# Patient Record
Sex: Female | Born: 1980 | Race: Black or African American | Hispanic: No | Marital: Single | Smoking: Never smoker
Health system: Southern US, Community
[De-identification: ages and names within clinical notes are randomized; demographics above are authoritative.]

## PROBLEM LIST (undated history)

## (undated) DIAGNOSIS — D649 Anemia, unspecified: Secondary | ICD-10-CM

## (undated) DIAGNOSIS — A4902 Methicillin resistant Staphylococcus aureus infection, unspecified site: Secondary | ICD-10-CM

## (undated) DIAGNOSIS — C73 Malignant neoplasm of thyroid gland: Secondary | ICD-10-CM

## (undated) DIAGNOSIS — R51 Headache: Secondary | ICD-10-CM

## (undated) DIAGNOSIS — Z8614 Personal history of Methicillin resistant Staphylococcus aureus infection: Secondary | ICD-10-CM

## (undated) DIAGNOSIS — R002 Palpitations: Secondary | ICD-10-CM

## (undated) DIAGNOSIS — M797 Fibromyalgia: Secondary | ICD-10-CM

## (undated) DIAGNOSIS — I349 Nonrheumatic mitral valve disorder, unspecified: Secondary | ICD-10-CM

## (undated) DIAGNOSIS — E039 Hypothyroidism, unspecified: Secondary | ICD-10-CM

## (undated) DIAGNOSIS — M1990 Unspecified osteoarthritis, unspecified site: Secondary | ICD-10-CM

## (undated) DIAGNOSIS — G894 Chronic pain syndrome: Secondary | ICD-10-CM

## (undated) DIAGNOSIS — F4310 Post-traumatic stress disorder, unspecified: Secondary | ICD-10-CM

## (undated) DIAGNOSIS — J45909 Unspecified asthma, uncomplicated: Secondary | ICD-10-CM

## (undated) DIAGNOSIS — N809 Endometriosis, unspecified: Secondary | ICD-10-CM

## (undated) DIAGNOSIS — M5481 Occipital neuralgia: Secondary | ICD-10-CM

## (undated) DIAGNOSIS — M419 Scoliosis, unspecified: Secondary | ICD-10-CM

## (undated) DIAGNOSIS — F329 Major depressive disorder, single episode, unspecified: Secondary | ICD-10-CM

## (undated) DIAGNOSIS — I499 Cardiac arrhythmia, unspecified: Secondary | ICD-10-CM

## (undated) DIAGNOSIS — M5030 Other cervical disc degeneration, unspecified cervical region: Secondary | ICD-10-CM

## (undated) DIAGNOSIS — R011 Cardiac murmur, unspecified: Secondary | ICD-10-CM

## (undated) DIAGNOSIS — R0600 Dyspnea, unspecified: Secondary | ICD-10-CM

## (undated) DIAGNOSIS — E119 Type 2 diabetes mellitus without complications: Secondary | ICD-10-CM

## (undated) DIAGNOSIS — R079 Chest pain, unspecified: Secondary | ICD-10-CM

## (undated) DIAGNOSIS — I340 Nonrheumatic mitral (valve) insufficiency: Secondary | ICD-10-CM

## (undated) DIAGNOSIS — I10 Essential (primary) hypertension: Secondary | ICD-10-CM

## (undated) DIAGNOSIS — K219 Gastro-esophageal reflux disease without esophagitis: Secondary | ICD-10-CM

## (undated) DIAGNOSIS — F32A Depression, unspecified: Secondary | ICD-10-CM

## (undated) DIAGNOSIS — I341 Nonrheumatic mitral (valve) prolapse: Secondary | ICD-10-CM

## (undated) DIAGNOSIS — J449 Chronic obstructive pulmonary disease, unspecified: Secondary | ICD-10-CM

## (undated) DIAGNOSIS — R0609 Other forms of dyspnea: Secondary | ICD-10-CM

## (undated) DIAGNOSIS — R519 Headache, unspecified: Secondary | ICD-10-CM

## (undated) DIAGNOSIS — F419 Anxiety disorder, unspecified: Secondary | ICD-10-CM

## (undated) HISTORY — PX: INCISION AND DRAINAGE OF WOUND: SHX1803

## (undated) HISTORY — DX: Gastro-esophageal reflux disease without esophagitis: K21.9

## (undated) HISTORY — PX: ECTOPIC PREGNANCY SURGERY: SHX613

## (undated) HISTORY — DX: Endometriosis, unspecified: N80.9

## (undated) HISTORY — DX: Dyspnea, unspecified: R06.00

## (undated) HISTORY — DX: Type 2 diabetes mellitus without complications: E11.9

## (undated) HISTORY — DX: Other forms of dyspnea: R06.09

## (undated) HISTORY — PX: CYSTECTOMY: SUR359

## (undated) HISTORY — PX: CHOLECYSTECTOMY: SHX55

## (undated) HISTORY — PX: THYROIDECTOMY: SHX17

## (undated) HISTORY — DX: Chest pain, unspecified: R07.9

## (undated) HISTORY — DX: Scoliosis, unspecified: M41.9

---

## 2001-10-23 ENCOUNTER — Emergency Department (HOSPITAL_COMMUNITY): Admission: EM | Admit: 2001-10-23 | Discharge: 2001-10-23 | Payer: Self-pay | Admitting: *Deleted

## 2003-03-06 ENCOUNTER — Inpatient Hospital Stay (HOSPITAL_COMMUNITY): Admission: EM | Admit: 2003-03-06 | Discharge: 2003-03-13 | Payer: Self-pay | Admitting: Psychiatry

## 2003-03-10 ENCOUNTER — Emergency Department (HOSPITAL_COMMUNITY): Admission: EM | Admit: 2003-03-10 | Discharge: 2003-03-10 | Payer: Self-pay | Admitting: Emergency Medicine

## 2003-12-05 ENCOUNTER — Inpatient Hospital Stay (HOSPITAL_COMMUNITY): Admission: EM | Admit: 2003-12-05 | Discharge: 2003-12-09 | Payer: Self-pay | Admitting: Psychiatry

## 2003-12-09 ENCOUNTER — Encounter: Payer: Self-pay | Admitting: Psychiatry

## 2004-11-27 ENCOUNTER — Emergency Department: Payer: Self-pay | Admitting: Emergency Medicine

## 2005-01-19 ENCOUNTER — Emergency Department: Payer: Self-pay | Admitting: Emergency Medicine

## 2005-02-08 ENCOUNTER — Emergency Department: Payer: Self-pay | Admitting: General Practice

## 2005-02-08 ENCOUNTER — Other Ambulatory Visit: Payer: Self-pay

## 2005-04-01 ENCOUNTER — Emergency Department: Payer: Self-pay | Admitting: General Practice

## 2005-05-29 ENCOUNTER — Emergency Department: Payer: Self-pay | Admitting: Emergency Medicine

## 2005-09-06 ENCOUNTER — Emergency Department: Payer: Self-pay | Admitting: Emergency Medicine

## 2005-09-29 ENCOUNTER — Emergency Department: Payer: Self-pay | Admitting: Emergency Medicine

## 2005-11-13 ENCOUNTER — Observation Stay: Payer: Self-pay | Admitting: Unknown Physician Specialty

## 2005-12-13 ENCOUNTER — Emergency Department: Payer: Self-pay | Admitting: Unknown Physician Specialty

## 2006-01-14 ENCOUNTER — Inpatient Hospital Stay (HOSPITAL_COMMUNITY): Admission: RE | Admit: 2006-01-14 | Discharge: 2006-01-17 | Payer: Self-pay | Admitting: Psychiatry

## 2006-01-15 ENCOUNTER — Ambulatory Visit: Payer: Self-pay | Admitting: Psychiatry

## 2006-03-20 ENCOUNTER — Emergency Department: Payer: Self-pay | Admitting: Emergency Medicine

## 2006-05-20 ENCOUNTER — Emergency Department: Payer: Self-pay | Admitting: Emergency Medicine

## 2006-08-08 ENCOUNTER — Emergency Department: Payer: Self-pay | Admitting: Emergency Medicine

## 2006-08-23 ENCOUNTER — Emergency Department: Payer: Self-pay | Admitting: Unknown Physician Specialty

## 2006-09-06 ENCOUNTER — Emergency Department: Payer: Self-pay | Admitting: Emergency Medicine

## 2006-10-04 ENCOUNTER — Emergency Department: Payer: Self-pay | Admitting: Emergency Medicine

## 2006-11-16 ENCOUNTER — Ambulatory Visit: Payer: Self-pay | Admitting: Internal Medicine

## 2006-11-27 ENCOUNTER — Ambulatory Visit: Payer: Self-pay

## 2006-12-08 ENCOUNTER — Emergency Department: Payer: Self-pay | Admitting: Emergency Medicine

## 2006-12-08 DIAGNOSIS — A4902 Methicillin resistant Staphylococcus aureus infection, unspecified site: Secondary | ICD-10-CM

## 2006-12-08 HISTORY — DX: Methicillin resistant Staphylococcus aureus infection, unspecified site: A49.02

## 2006-12-12 ENCOUNTER — Emergency Department: Payer: Self-pay | Admitting: General Practice

## 2006-12-28 ENCOUNTER — Ambulatory Visit: Payer: Self-pay | Admitting: Unknown Physician Specialty

## 2007-01-01 ENCOUNTER — Emergency Department: Payer: Self-pay | Admitting: Internal Medicine

## 2007-01-12 ENCOUNTER — Ambulatory Visit: Payer: Self-pay | Admitting: Internal Medicine

## 2007-02-06 ENCOUNTER — Ambulatory Visit: Payer: Self-pay | Admitting: Internal Medicine

## 2007-02-22 ENCOUNTER — Emergency Department (HOSPITAL_COMMUNITY): Admission: EM | Admit: 2007-02-22 | Discharge: 2007-02-22 | Payer: Self-pay | Admitting: Emergency Medicine

## 2007-03-09 ENCOUNTER — Ambulatory Visit: Payer: Self-pay | Admitting: Internal Medicine

## 2007-04-08 ENCOUNTER — Ambulatory Visit: Payer: Self-pay | Admitting: Internal Medicine

## 2007-05-03 ENCOUNTER — Emergency Department (HOSPITAL_COMMUNITY): Admission: EM | Admit: 2007-05-03 | Discharge: 2007-05-03 | Payer: Self-pay | Admitting: Emergency Medicine

## 2007-06-08 ENCOUNTER — Ambulatory Visit: Payer: Self-pay | Admitting: Internal Medicine

## 2007-06-18 ENCOUNTER — Ambulatory Visit: Payer: Self-pay | Admitting: Internal Medicine

## 2007-07-09 ENCOUNTER — Ambulatory Visit: Payer: Self-pay | Admitting: Internal Medicine

## 2007-07-18 ENCOUNTER — Emergency Department (HOSPITAL_COMMUNITY): Admission: EM | Admit: 2007-07-18 | Discharge: 2007-07-19 | Payer: Self-pay | Admitting: Emergency Medicine

## 2007-07-26 ENCOUNTER — Emergency Department (HOSPITAL_COMMUNITY): Admission: EM | Admit: 2007-07-26 | Discharge: 2007-07-27 | Payer: Self-pay | Admitting: Emergency Medicine

## 2007-08-02 ENCOUNTER — Emergency Department (HOSPITAL_COMMUNITY): Admission: EM | Admit: 2007-08-02 | Discharge: 2007-08-02 | Payer: Self-pay | Admitting: Emergency Medicine

## 2007-08-10 ENCOUNTER — Emergency Department (HOSPITAL_COMMUNITY): Admission: EM | Admit: 2007-08-10 | Discharge: 2007-08-10 | Payer: Self-pay | Admitting: Emergency Medicine

## 2007-08-11 ENCOUNTER — Inpatient Hospital Stay (HOSPITAL_COMMUNITY): Admission: EM | Admit: 2007-08-11 | Discharge: 2007-08-14 | Payer: Self-pay | Admitting: Emergency Medicine

## 2007-08-12 ENCOUNTER — Encounter (INDEPENDENT_AMBULATORY_CARE_PROVIDER_SITE_OTHER): Payer: Self-pay | Admitting: General Surgery

## 2007-08-15 ENCOUNTER — Encounter (HOSPITAL_COMMUNITY): Admission: RE | Admit: 2007-08-15 | Discharge: 2007-09-07 | Payer: Self-pay | Admitting: General Surgery

## 2007-08-18 ENCOUNTER — Emergency Department (HOSPITAL_COMMUNITY): Admission: EM | Admit: 2007-08-18 | Discharge: 2007-08-18 | Payer: Self-pay | Admitting: Emergency Medicine

## 2007-09-15 ENCOUNTER — Emergency Department (HOSPITAL_COMMUNITY): Admission: EM | Admit: 2007-09-15 | Discharge: 2007-09-15 | Payer: Self-pay | Admitting: Emergency Medicine

## 2007-10-09 ENCOUNTER — Ambulatory Visit: Payer: Self-pay | Admitting: Internal Medicine

## 2007-10-15 ENCOUNTER — Ambulatory Visit: Payer: Self-pay | Admitting: Internal Medicine

## 2007-10-27 ENCOUNTER — Emergency Department (HOSPITAL_COMMUNITY): Admission: EM | Admit: 2007-10-27 | Discharge: 2007-10-27 | Payer: Self-pay | Admitting: Emergency Medicine

## 2007-11-08 ENCOUNTER — Ambulatory Visit: Payer: Self-pay | Admitting: Internal Medicine

## 2007-12-03 ENCOUNTER — Emergency Department (HOSPITAL_COMMUNITY): Admission: EM | Admit: 2007-12-03 | Discharge: 2007-12-03 | Payer: Self-pay | Admitting: Emergency Medicine

## 2007-12-08 ENCOUNTER — Emergency Department: Payer: Self-pay | Admitting: Emergency Medicine

## 2007-12-10 ENCOUNTER — Emergency Department: Payer: Self-pay | Admitting: Emergency Medicine

## 2007-12-12 ENCOUNTER — Emergency Department: Payer: Self-pay | Admitting: Emergency Medicine

## 2008-02-06 ENCOUNTER — Ambulatory Visit: Payer: Self-pay | Admitting: Internal Medicine

## 2008-02-10 ENCOUNTER — Ambulatory Visit: Payer: Self-pay | Admitting: Internal Medicine

## 2008-02-11 ENCOUNTER — Emergency Department: Payer: Self-pay | Admitting: Emergency Medicine

## 2008-03-08 ENCOUNTER — Ambulatory Visit: Payer: Self-pay | Admitting: Internal Medicine

## 2008-03-13 ENCOUNTER — Emergency Department: Payer: Self-pay | Admitting: Emergency Medicine

## 2008-03-14 ENCOUNTER — Ambulatory Visit: Payer: Self-pay | Admitting: Internal Medicine

## 2008-04-07 ENCOUNTER — Ambulatory Visit: Payer: Self-pay | Admitting: Internal Medicine

## 2008-04-21 ENCOUNTER — Emergency Department: Payer: Self-pay | Admitting: Emergency Medicine

## 2008-04-24 ENCOUNTER — Emergency Department: Payer: Self-pay | Admitting: Emergency Medicine

## 2008-05-08 ENCOUNTER — Emergency Department: Payer: Self-pay | Admitting: Emergency Medicine

## 2008-05-11 ENCOUNTER — Emergency Department: Payer: Self-pay | Admitting: Emergency Medicine

## 2008-09-08 ENCOUNTER — Emergency Department: Payer: Self-pay | Admitting: Internal Medicine

## 2008-11-27 ENCOUNTER — Emergency Department: Payer: Self-pay | Admitting: Emergency Medicine

## 2009-02-16 ENCOUNTER — Ambulatory Visit: Payer: Self-pay | Admitting: Internal Medicine

## 2009-03-08 ENCOUNTER — Ambulatory Visit: Payer: Self-pay | Admitting: Internal Medicine

## 2009-03-10 ENCOUNTER — Inpatient Hospital Stay (HOSPITAL_COMMUNITY): Admission: AD | Admit: 2009-03-10 | Discharge: 2009-03-13 | Payer: Self-pay | Admitting: Obstetrics and Gynecology

## 2009-03-10 ENCOUNTER — Ambulatory Visit: Payer: Self-pay | Admitting: Family Medicine

## 2009-03-10 ENCOUNTER — Encounter: Payer: Self-pay | Admitting: Emergency Medicine

## 2009-03-11 ENCOUNTER — Encounter: Payer: Self-pay | Admitting: Obstetrics and Gynecology

## 2009-03-20 ENCOUNTER — Emergency Department: Payer: Self-pay | Admitting: Emergency Medicine

## 2009-04-07 ENCOUNTER — Ambulatory Visit: Payer: Self-pay | Admitting: Internal Medicine

## 2009-04-09 ENCOUNTER — Emergency Department: Payer: Self-pay | Admitting: Emergency Medicine

## 2009-05-01 ENCOUNTER — Ambulatory Visit: Payer: Self-pay | Admitting: Internal Medicine

## 2009-05-06 ENCOUNTER — Emergency Department: Payer: Self-pay | Admitting: Emergency Medicine

## 2009-05-08 ENCOUNTER — Ambulatory Visit: Payer: Self-pay | Admitting: Internal Medicine

## 2009-05-08 ENCOUNTER — Emergency Department: Payer: Self-pay | Admitting: Internal Medicine

## 2009-06-14 ENCOUNTER — Emergency Department (HOSPITAL_COMMUNITY): Admission: EM | Admit: 2009-06-14 | Discharge: 2009-06-14 | Payer: Self-pay | Admitting: Emergency Medicine

## 2009-06-15 ENCOUNTER — Ambulatory Visit: Payer: Self-pay | Admitting: Internal Medicine

## 2009-07-08 ENCOUNTER — Ambulatory Visit: Payer: Self-pay | Admitting: Internal Medicine

## 2009-12-26 ENCOUNTER — Emergency Department (HOSPITAL_COMMUNITY): Admission: EM | Admit: 2009-12-26 | Discharge: 2009-12-26 | Payer: Self-pay | Admitting: Emergency Medicine

## 2010-01-08 ENCOUNTER — Ambulatory Visit: Payer: Self-pay | Admitting: Internal Medicine

## 2010-02-01 ENCOUNTER — Ambulatory Visit: Payer: Self-pay | Admitting: Internal Medicine

## 2010-02-05 ENCOUNTER — Ambulatory Visit: Payer: Self-pay | Admitting: Internal Medicine

## 2010-03-09 ENCOUNTER — Emergency Department (HOSPITAL_COMMUNITY): Admission: EM | Admit: 2010-03-09 | Discharge: 2010-03-09 | Payer: Self-pay | Admitting: Emergency Medicine

## 2010-08-26 ENCOUNTER — Emergency Department (HOSPITAL_COMMUNITY): Admission: EM | Admit: 2010-08-26 | Discharge: 2010-08-26 | Payer: Self-pay | Admitting: Emergency Medicine

## 2010-08-27 ENCOUNTER — Ambulatory Visit (HOSPITAL_COMMUNITY): Admission: RE | Admit: 2010-08-27 | Discharge: 2010-08-27 | Payer: Self-pay | Admitting: Emergency Medicine

## 2010-08-31 ENCOUNTER — Emergency Department (HOSPITAL_COMMUNITY): Admission: EM | Admit: 2010-08-31 | Discharge: 2010-08-31 | Payer: Self-pay | Admitting: Emergency Medicine

## 2010-09-07 ENCOUNTER — Emergency Department (HOSPITAL_COMMUNITY): Admission: EM | Admit: 2010-09-07 | Discharge: 2010-09-07 | Payer: Self-pay | Admitting: Emergency Medicine

## 2010-09-07 ENCOUNTER — Emergency Department: Payer: Self-pay | Admitting: Emergency Medicine

## 2010-09-14 ENCOUNTER — Inpatient Hospital Stay: Payer: Self-pay | Admitting: Psychiatry

## 2010-09-21 ENCOUNTER — Emergency Department: Payer: Self-pay | Admitting: Emergency Medicine

## 2010-10-17 ENCOUNTER — Emergency Department (HOSPITAL_COMMUNITY): Admission: EM | Admit: 2010-10-17 | Discharge: 2010-10-17 | Payer: Self-pay | Admitting: Emergency Medicine

## 2010-10-18 ENCOUNTER — Ambulatory Visit: Payer: Self-pay | Admitting: Internal Medicine

## 2010-11-07 ENCOUNTER — Ambulatory Visit: Payer: Self-pay | Admitting: Internal Medicine

## 2010-11-14 ENCOUNTER — Emergency Department (HOSPITAL_COMMUNITY)
Admission: EM | Admit: 2010-11-14 | Discharge: 2010-11-14 | Payer: Self-pay | Source: Home / Self Care | Admitting: Emergency Medicine

## 2010-11-19 ENCOUNTER — Emergency Department (HOSPITAL_COMMUNITY)
Admission: EM | Admit: 2010-11-19 | Discharge: 2010-11-19 | Payer: Self-pay | Source: Home / Self Care | Admitting: Emergency Medicine

## 2010-12-02 ENCOUNTER — Emergency Department (HOSPITAL_COMMUNITY)
Admission: EM | Admit: 2010-12-02 | Discharge: 2010-12-02 | Payer: Self-pay | Source: Home / Self Care | Admitting: Emergency Medicine

## 2010-12-29 ENCOUNTER — Emergency Department (HOSPITAL_COMMUNITY)
Admission: EM | Admit: 2010-12-29 | Discharge: 2010-12-29 | Payer: Self-pay | Source: Home / Self Care | Admitting: Emergency Medicine

## 2011-01-13 ENCOUNTER — Emergency Department (HOSPITAL_COMMUNITY)
Admission: EM | Admit: 2011-01-13 | Discharge: 2011-01-13 | Disposition: A | Discharge status: Home / Self Care | Payer: Medicaid Other | Attending: Emergency Medicine | Admitting: Emergency Medicine

## 2011-01-13 DIAGNOSIS — R112 Nausea with vomiting, unspecified: Secondary | ICD-10-CM | POA: Insufficient documentation

## 2011-01-13 LAB — URINALYSIS, ROUTINE W REFLEX MICROSCOPIC
Nitrite: NEGATIVE
Specific Gravity, Urine: 1.025 (ref 1.005–1.030)
Urine Glucose, Fasting: NEGATIVE mg/dL
pH: 6 (ref 5.0–8.0)

## 2011-01-13 LAB — POCT PREGNANCY, URINE: Preg Test, Ur: NEGATIVE

## 2011-01-14 LAB — URINE CULTURE
Colony Count: NO GROWTH
Culture  Setup Time: 201202070149

## 2011-01-19 ENCOUNTER — Emergency Department (HOSPITAL_COMMUNITY)
Admission: EM | Admit: 2011-01-19 | Discharge: 2011-01-19 | Disposition: A | Discharge status: Home / Self Care | Payer: Medicaid Other | Attending: Emergency Medicine | Admitting: Emergency Medicine

## 2011-01-19 DIAGNOSIS — Z85850 Personal history of malignant neoplasm of thyroid: Secondary | ICD-10-CM | POA: Insufficient documentation

## 2011-01-19 DIAGNOSIS — K644 Residual hemorrhoidal skin tags: Secondary | ICD-10-CM | POA: Insufficient documentation

## 2011-01-20 ENCOUNTER — Other Ambulatory Visit (HOSPITAL_COMMUNITY)
Admission: RE | Admit: 2011-01-20 | Discharge: 2011-01-20 | Disposition: A | Discharge status: Home / Self Care | Payer: Medicaid Other | Source: Ambulatory Visit | Attending: Obstetrics and Gynecology | Admitting: Obstetrics and Gynecology

## 2011-01-20 ENCOUNTER — Other Ambulatory Visit: Payer: Self-pay | Admitting: Obstetrics and Gynecology

## 2011-01-20 DIAGNOSIS — Z01419 Encounter for gynecological examination (general) (routine) without abnormal findings: Secondary | ICD-10-CM | POA: Insufficient documentation

## 2011-02-02 ENCOUNTER — Inpatient Hospital Stay (INDEPENDENT_AMBULATORY_CARE_PROVIDER_SITE_OTHER)
Admission: RE | Admit: 2011-02-02 | Discharge: 2011-02-02 | Disposition: A | Discharge status: Home / Self Care | Payer: Medicaid Other | Source: Ambulatory Visit | Attending: Emergency Medicine | Admitting: Emergency Medicine

## 2011-02-02 DIAGNOSIS — R0609 Other forms of dyspnea: Secondary | ICD-10-CM

## 2011-02-17 LAB — URINALYSIS, ROUTINE W REFLEX MICROSCOPIC
Bilirubin Urine: NEGATIVE
Glucose, UA: NEGATIVE mg/dL
Hgb urine dipstick: NEGATIVE
Ketones, ur: NEGATIVE mg/dL
pH: 6 (ref 5.0–8.0)

## 2011-02-17 LAB — COMPREHENSIVE METABOLIC PANEL
ALT: 12 U/L (ref 0–35)
Alkaline Phosphatase: 49 U/L (ref 39–117)
BUN: 7 mg/dL (ref 6–23)
CO2: 26 mEq/L (ref 19–32)
Calcium: 9.2 mg/dL (ref 8.4–10.5)
GFR calc non Af Amer: >60 mL/min (ref >60)
Glucose, Bld: 99 mg/dL (ref 70–99)
Sodium: 140 mEq/L (ref 135–145)

## 2011-02-17 LAB — CBC
HCT: 36.9 % (ref 36.0–46.0)
Hemoglobin: 11.8 g/dL — ABNORMAL LOW (ref 12.0–15.0)
MCHC: 32 g/dL (ref 30.0–36.0)
MCV: 78.7 fL (ref 78.0–100.0)

## 2011-02-17 LAB — DIFFERENTIAL
Basophils Relative: 0 % (ref 0–1)
Eosinophils Absolute: 0 10*3/uL (ref 0.0–0.7)
Lymphs Abs: 2.1 10*3/uL (ref 0.7–4.0)
Neutrophils Relative %: 59 % (ref 43–77)

## 2011-02-18 LAB — CBC
HCT: 32.3 % — ABNORMAL LOW (ref 36.0–46.0)
MCV: 80 fL (ref 78.0–100.0)
Platelets: 243 10*3/uL (ref 150–400)
RBC: 4.04 MIL/uL (ref 3.87–5.11)
WBC: 5.9 10*3/uL (ref 4.0–10.5)

## 2011-02-18 LAB — URINALYSIS, ROUTINE W REFLEX MICROSCOPIC
Nitrite: NEGATIVE
Protein, ur: NEGATIVE mg/dL
Urobilinogen, UA: 0.2 mg/dL (ref 0.0–1.0)

## 2011-02-18 LAB — DIFFERENTIAL
Basophils Absolute: 0 10*3/uL (ref 0.0–0.1)
Basophils Relative: 0 % (ref 0–1)
Eosinophils Absolute: 0 10*3/uL (ref 0.0–0.7)
Monocytes Absolute: 0.7 10*3/uL (ref 0.1–1.0)
Neutro Abs: 3.5 10*3/uL (ref 1.7–7.7)

## 2011-02-18 LAB — COMPREHENSIVE METABOLIC PANEL
Albumin: 3.6 g/dL (ref 3.5–5.2)
Alkaline Phosphatase: 42 U/L (ref 39–117)
BUN: 14 mg/dL (ref 6–23)
Chloride: 105 mEq/L (ref 96–112)
Glucose, Bld: 88 mg/dL (ref 70–99)
Potassium: 4 mEq/L (ref 3.5–5.1)
Total Bilirubin: 0.5 mg/dL (ref 0.3–1.2)

## 2011-02-18 LAB — POCT PREGNANCY, URINE: Preg Test, Ur: NEGATIVE

## 2011-02-19 LAB — URINALYSIS, ROUTINE W REFLEX MICROSCOPIC
Bilirubin Urine: NEGATIVE
Glucose, UA: NEGATIVE mg/dL
Hgb urine dipstick: NEGATIVE
Ketones, ur: NEGATIVE mg/dL
Protein, ur: NEGATIVE mg/dL

## 2011-02-19 LAB — POCT PREGNANCY, URINE: Preg Test, Ur: NEGATIVE

## 2011-02-20 LAB — CBC
HCT: 32.8 % — ABNORMAL LOW (ref 36.0–46.0)
MCHC: 32.9 g/dL (ref 30.0–36.0)
MCV: 83.7 fL (ref 78.0–100.0)
Platelets: 237 10*3/uL (ref 150–400)
RDW: 17 % — ABNORMAL HIGH (ref 11.5–15.5)
WBC: 9 10*3/uL (ref 4.0–10.5)

## 2011-02-20 LAB — URINALYSIS, ROUTINE W REFLEX MICROSCOPIC
Bilirubin Urine: NEGATIVE
Glucose, UA: NEGATIVE mg/dL
Nitrite: NEGATIVE
Protein, ur: NEGATIVE mg/dL
Protein, ur: NEGATIVE mg/dL
Specific Gravity, Urine: 1.01 (ref 1.005–1.030)
Specific Gravity, Urine: >1.03 — ABNORMAL HIGH (ref 1.005–1.030)
Urobilinogen, UA: 1 mg/dL (ref 0.0–1.0)
pH: 6.5 (ref 5.0–8.0)

## 2011-02-20 LAB — URINE MICROSCOPIC-ADD ON

## 2011-02-20 LAB — DIFFERENTIAL
Lymphocytes Relative: 29 % (ref 12–46)
Lymphs Abs: 2.6 10*3/uL (ref 0.7–4.0)
Monocytes Absolute: 0.7 10*3/uL (ref 0.1–1.0)
Monocytes Relative: 8 % (ref 3–12)
Neutro Abs: 5.6 10*3/uL (ref 1.7–7.7)
Neutrophils Relative %: 62 % (ref 43–77)

## 2011-02-20 LAB — COMPREHENSIVE METABOLIC PANEL
Albumin: 4 g/dL (ref 3.5–5.2)
Alkaline Phosphatase: 43 U/L (ref 39–117)
BUN: 7 mg/dL (ref 6–23)
Calcium: 9 mg/dL (ref 8.4–10.5)
Glucose, Bld: 85 mg/dL (ref 70–99)
Potassium: 3.3 mEq/L — ABNORMAL LOW (ref 3.5–5.1)
Total Protein: 7.2 g/dL (ref 6.0–8.3)

## 2011-02-20 LAB — GC/CHLAMYDIA PROBE AMP, GENITAL: GC Probe Amp, Genital: NEGATIVE

## 2011-02-20 LAB — WET PREP, GENITAL: Yeast Wet Prep HPF POC: NONE SEEN

## 2011-02-20 LAB — POCT PREGNANCY, URINE: Preg Test, Ur: NEGATIVE

## 2011-02-23 LAB — URINALYSIS, ROUTINE W REFLEX MICROSCOPIC
Ketones, ur: NEGATIVE mg/dL
Nitrite: NEGATIVE
Protein, ur: NEGATIVE mg/dL
pH: 6 (ref 5.0–8.0)

## 2011-02-23 LAB — WET PREP, GENITAL: Trich, Wet Prep: NONE SEEN

## 2011-02-23 LAB — POCT PREGNANCY, URINE: Preg Test, Ur: NEGATIVE

## 2011-02-23 LAB — GC/CHLAMYDIA PROBE AMP, GENITAL: Chlamydia, DNA Probe: NEGATIVE

## 2011-02-23 LAB — RPR: RPR Ser Ql: NONREACTIVE

## 2011-03-19 LAB — BASIC METABOLIC PANEL
Calcium: 8.8 mg/dL (ref 8.4–10.5)
GFR calc Af Amer: >60 mL/min (ref >60)
GFR calc non Af Amer: >60 mL/min (ref >60)
Glucose, Bld: 139 mg/dL — ABNORMAL HIGH (ref 70–99)
Sodium: 136 mEq/L (ref 135–145)

## 2011-03-19 LAB — GC/CHLAMYDIA PROBE AMP, URINE
Chlamydia, Swab/Urine, PCR: NEGATIVE
GC Probe Amp, Urine: NEGATIVE

## 2011-03-19 LAB — COMPREHENSIVE METABOLIC PANEL
ALT: 12 U/L (ref 0–35)
AST: 18 U/L (ref 0–37)
Albumin: 4.4 g/dL (ref 3.5–5.2)
Alkaline Phosphatase: 61 U/L (ref 39–117)
GFR calc Af Amer: >60 mL/min (ref >60)
Potassium: 3.2 mEq/L — ABNORMAL LOW (ref 3.5–5.1)
Sodium: 141 mEq/L (ref 135–145)
Total Protein: 7.4 g/dL (ref 6.0–8.3)

## 2011-03-19 LAB — CBC
HCT: 37.6 % (ref 36.0–46.0)
Hemoglobin: 12.4 g/dL (ref 12.0–15.0)
Hemoglobin: 9.2 g/dL — ABNORMAL LOW (ref 12.0–15.0)
MCHC: 32.9 g/dL (ref 30.0–36.0)
MCHC: 32.9 g/dL (ref 30.0–36.0)
MCV: 86.3 fL (ref 78.0–100.0)
MCV: 87.1 fL (ref 78.0–100.0)
Platelets: 228 10*3/uL (ref 150–400)
Platelets: 205 10*3/uL (ref 150–400)
RBC: 4.36 MIL/uL (ref 3.87–5.11)
RDW: 15.8 % — ABNORMAL HIGH (ref 11.5–15.5)
RDW: 15.6 % — ABNORMAL HIGH (ref 11.5–15.5)
WBC: 9.4 10*3/uL (ref 4.0–10.5)
WBC: 6.6 10*3/uL (ref 4.0–10.5)

## 2011-03-19 LAB — DIFFERENTIAL
Basophils Absolute: 0.1 10*3/uL (ref 0.0–0.1)
Basophils Relative: 1 % (ref 0–1)
Basophils Relative: 1 % (ref 0–1)
Eosinophils Absolute: 0 10*3/uL (ref 0.0–0.7)
Eosinophils Relative: 1 % (ref 0–5)
Eosinophils Relative: 1 % (ref 0–5)
Lymphocytes Relative: 12 % (ref 12–46)
Lymphs Abs: 1.1 10*3/uL (ref 0.7–4.0)
Lymphs Abs: 2 10*3/uL (ref 0.7–4.0)
Monocytes Absolute: 0.6 10*3/uL (ref 0.1–1.0)
Monocytes Relative: 6 % (ref 3–12)
Monocytes Relative: 10 % (ref 3–12)
Neutro Abs: 7.6 10*3/uL (ref 1.7–7.7)
Neutro Abs: 3.7 10*3/uL (ref 1.7–7.7)
Neutrophils Relative %: 81 % — ABNORMAL HIGH (ref 43–77)

## 2011-03-19 LAB — COMPREHENSIVE METABOLIC PANEL WITH GFR
ALT: 11 U/L (ref 0–35)
AST: 17 U/L (ref 0–37)
Albumin: 3.5 g/dL (ref 3.5–5.2)
Alkaline Phosphatase: 48 U/L (ref 39–117)
BUN: 2 mg/dL — ABNORMAL LOW (ref 6–23)
CO2: 25 meq/L (ref 19–32)
Calcium: 8.3 mg/dL — ABNORMAL LOW (ref 8.4–10.5)
Chloride: 105 meq/L (ref 96–112)
Creatinine, Ser: 0.75 mg/dL (ref 0.4–1.2)
GFR calc non Af Amer: >60 mL/min
Glucose, Bld: 96 mg/dL (ref 70–99)
Potassium: 3.2 meq/L — ABNORMAL LOW (ref 3.5–5.1)
Sodium: 135 meq/L (ref 135–145)
Total Bilirubin: 0.6 mg/dL (ref 0.3–1.2)
Total Protein: 6.3 g/dL (ref 6.0–8.3)

## 2011-03-19 LAB — URINALYSIS, ROUTINE W REFLEX MICROSCOPIC
Ketones, ur: NEGATIVE mg/dL
Nitrite: NEGATIVE
Protein, ur: NEGATIVE mg/dL

## 2011-03-19 LAB — GC/CHLAMYDIA PROBE AMP, GENITAL
Chlamydia, DNA Probe: NEGATIVE
GC Probe Amp, Genital: NEGATIVE

## 2011-03-19 LAB — LIPASE, BLOOD: Lipase: 21 U/L (ref 11–59)

## 2011-03-19 LAB — PREGNANCY, URINE: Preg Test, Ur: NEGATIVE

## 2011-03-19 LAB — TYPE AND SCREEN: Antibody Screen: NEGATIVE

## 2011-03-19 LAB — WET PREP, GENITAL
Clue Cells Wet Prep HPF POC: NONE SEEN
Trich, Wet Prep: NONE SEEN
Yeast Wet Prep HPF POC: NONE SEEN

## 2011-04-22 NOTE — H&P (Signed)
NAMELALIA, LOUDON         ACCOUNT NO.:  [id number]   MEDICAL RECORD NO.:  [id number]          PATIENT TYPE:  INP   LOCATION:  A306                          FACILITY:  [facility]   PHYSICIAN:  Osvaldo Shipper, MD     DATE OF BIRTH:  April 03, 1981   DATE OF ADMISSION:  08/11/2007  DATE OF DISCHARGE:  LH                              HISTORY & PHYSICAL   Patient does not have a medical doctor here.  She is followed by an  oncologist in [place].   ADMISSION DIAGNOSES:  1. Right thigh cellulitis, possibly methicillin-resistant      staphylococcus aureus.  2. History of thyroid cancer, status post total thyroidectomy.  3. History of post-traumatic stress disorder, on treatment.  4. History of asthma.   CHIEF COMPLAINT:  Pain in the right leg and feeling weak for the past  three days.   HISTORY OF PRESENT ILLNESS:  Patient is a 30 year old African-American  female who has a history of PTSD, a history of thyroid cancer for which  she underwent thyroidectomy and underwent radiation therapy, who  presented to the ED initially yesterday complaining of lesion on her  right thigh which was causing pain and which was swollen.  Patient was  seen in the emergency department, was given one dose of vancomycin, and  was discharged on Septra and pain medications.  Patient returned today  with worsening of her symptoms.  The lesion started about three days  ago, as a spot which has slowly increased in size.  The pain was  initially about a 3/10 and now increased to 10/10.  The pain is  described as an achy pain located in the right thigh with no radiation.  The redness has also progressed from the thigh to involve the inner part  of the thigh as well.  She admits to having fever and chills.  Started  nausea and vomiting this morning.  Also had an episode of diarrhea this  morning.  No abdominal pain.  No shortness of breath.  No cough.   HOME MEDICATIONS:  1. Albuterol MDI as needed.  2. Paxil  40 mg daily.  3. Risperdal 0.5 mg t.i.d.  4. Trazodone 100 mg nightly.  5. Levothyroxine, unknown dose, daily.   ALLERGIES:  1. ASPIRIN, which causes GI intolerance, nosebleed, swelling.  2. HYDROCODONE, which causes GI upset and headache.   PAST MEDICAL HISTORY:  Positive for PTSD, asthma, cancer of the thyroid  for which she underwent a thyroidectomy in February, 2008.  The thyroid  cancer was diagnosed in January, 2008.  She has been getting radiation  treatments, and the last treatment was about five weeks ago.  She is  followed by an oncologist in [place].  She mentioned that she was  diagnosed with possibly a new tumor in the thyroid about two weeks ago.  She plans to transfer all of her care to the local oncologist here, as  she apparently has moved to the [place] area.   PAST SURGICAL HISTORY:  Ovarian cyst removal a few years ago.   SOCIAL HISTORY:  Lives in [place] with her mother.  No smoking,  alcohol, or illicit drug use.  She is currently disabled from her  various medical and psychiatric problems.   FAMILY HISTORY:  Positive for grandmother, who has diabetes.   REVIEW OF SYSTEMS:  GENERAL:  Positive for weakness.  HEENT:  Positive  for headache.  CARDIOVASCULAR:  Unremarkable.  RESPIRATORY:  Unremarkable.  GI:  As in HPI.  GU:  She mentioned that passing urine  causes pain in her thighs.  No burning sensation in the vaginal area.  LMP was August 02, 2007.  MUSCULOSKELETAL:  Unremarkable.  DERMATOLOGIC:  As in HPI.  NEUROLOGIC:  Unremarkable.  PSYCHIATRIC:  Positive for PTSD,  otherwise unremarkable.  Other systems unremarkable.   PHYSICAL EXAMINATION:  VITAL SIGNS:  Temperature here 98.6, blood  pressure 125/66, heart rate in the 80s, respiratory rate 18.  Saturation  99% on room air.  GENERAL:  A well-developed, well-nourished individual not in distress.  HEENT:  There is no pallor, no icterus.  Oral mucous membranes are  moist.  No oral lesions  noticed.  NECK:  Soft and supple.  LUNGS:  Clear to auscultation bilaterally.  CARDIOVASCULAR:  S1 and S2 is normal, regular.  No murmurs appreciated.  ABDOMEN:  Soft, nontender, nondistended.  Bowel sounds present.  No mass  or organomegaly appreciated.  No edema is present.  No neurological deficits.  DERMATOLOGIC:  Significantly erythematous area in the right thigh and  groin area with erythema extending medially over the perineum, over the  pubic area, as well as some more to the perineum.  Also extending down  to the inner thigh and inferiorly towards the knee, although does not go  all the way there.  There is an area of induration about 4 x 3 cm in the  upper thigh.  This appears to be a small area from which a small amount  of yellowish fluid is noticed.  The area is exquisitely tender to  palpation and is definitely warm to touch as well.  Peripheral pulses  are palpable.   LAB DATA:  Her white count is 17,700 with 90% neutrophils.  No bands  reported.  Hemoglobin is 10.7, platelet count is 211.  CMET is positive  for a glucose of 141, otherwise unremarkable.  UA is not done today.   ASSESSMENT:  This is a 30 year old African-American female with medical  problems, as stated earlier, who presents with erythema, swelling,  warmth, and pain in the right thigh area, found to have evidence for  cellulitis.  She mentioned the possibility of a spider bite, although I  think this is possibly just methicillin-resistant staphylococcus aureus.  There could have been some kind of an insect bite in the past.  In any  case, she has evidence for significant cellulitis.  There is the  possibility of a small abscess as well.  She also has anemia of  unspecified type.  She has other medical issues which warrant outpatient  followup.   PLAN:  1. Right thigh cellulitis:  She needs to be admitted to the hospital      for IV antibiotics.  She will be put on vancomycin and will be      given  Levaquin as well.  Blood cultures and wound cultures have      been sent.  We have consulted general surgery to see the patient to      see if she needs an I&D.  Hopefully after 2-3 days of antibiotics,      the lesion  should improve, and then she can be discharged home with      p.o. antibiotics to complete 10 days to two week course.  2. Anemia:  We will do an anemia panel on her.  She is a woman who has      menstrual periods, and anemia is not entirely surprising.  However,      we will make sure there is no iron or B12 deficiency.  3. History of thyroid cancer:  I will let her follow up with her      oncologist after she is discharged.  In the meantime, I will      continue levothyroxine.  I will ask the patient to find out the      dose of this medication so that we can prescribe her the      appropriate dosage.  4. Post-traumatic stress disorder:  Continue on her psychotropic      medications for now.  5. Pain relief with Dilaudid.  6. Contact precautions.  7. Nausea will be controlled with p.r.n. medications.  8. IV fluids will be given as well.  A UA will be checked.  I do not      plan to check her thyroid function tests at this time.      Osvaldo Shipper, MD  Electronically Signed     GK/MEDQ  D:  08/11/2007  T:  08/11/2007  Job:  [id number]   cc:   Barbaraann Barthel, M.D.  Fax: [phone]

## 2011-04-22 NOTE — Consult Note (Signed)
NAME:  Kristina Li, Kristina Li NO.:  [id number]   MEDICAL RECORD NO.:  [id number]          PATIENT TYPE:  INP   LOCATION:  A306                          FACILITY:  [facility]   PHYSICIAN:  Barbaraann Barthel, M.D. DATE OF BIRTH:  [birth year]   DATE OF CONSULTATION:  08/11/2007  DATE OF DISCHARGE:                                 CONSULTATION   REASON FOR CONSULTATION:  Surgery was asked to see this 30 year old  black female for an abscess in her right upper thigh.   HISTORY OF PRESENT MEDICAL ILLNESS:  She came to the emergency room  yesterday and was treated with antibiotics and released when she was  complaining of pain in her right upper thigh that she was presumed to  have been bitten by a spider.  She did not see this spider, but she felt  something and brushed it off the day before, and then later she  developed this pain in this area, and she presented with an abscess in  formation yesterday in the emergency room.  She is now readmitted to the  medical service with similar complaints.  Surgery was asked to see her.  In essence, she has an area approximately 2 x 2 cm in diameter and with  surrounding cellulitis.  This is tender to the touch and appears to be  an area of where the skin is breeched in the middle of this.  This  possibly could be a spider bite.  There is no necrosis around it.  However, there is considerable discomfort.  The patient is afebrile with  temperature of 98.2.  Her vital signs otherwise are stable with blood  pressure 125/62, heart rate 82, respirations 20, and she appears  comfortable but she does not want in any way to have this done under  local anesthesia as she is very emotionally labile.  She has a past  history of post-traumatic stress disorder, and she is on treatment for  this, and we that will arrange for taking care of this in the operating  room.  I have discussed the complications not limited to but including  bleeding, infection, the  possibility of further debridement might be  required.  Informed consent will be obtained.  I will follow this wound  along with the medical service and I thank them for the consult.      Barbaraann Barthel, M.D.  Electronically Signed     WB/MEDQ  D:  08/11/2007  T:  08/12/2007  Job:  [id number]   cc:   Osvaldo Shipper, MD

## 2011-04-22 NOTE — Op Note (Signed)
NAME:  Kristina Li, Kristina Li NO.:  [id number]   MEDICAL RECORD NO.:  [id number]          PATIENT TYPE:  INP   LOCATION:  9320                          FACILITY:  [facility]   PHYSICIAN:  Pieter Partridge, MD   DATE OF BIRTH:  Apr 21, 1981   DATE OF PROCEDURE:  03/11/2009  DATE OF DISCHARGE:                               OPERATIVE REPORT   PREOPERATIVE DIAGNOSIS:  Right lower quadrant pain, right adnexal mass.   POSTOPERATIVE DIAGNOSIS:  Right lower quadrant pain, right adnexal mass  suspect endometrioma.   PROCEDURE:  Diagnostic laparoscopy converted to exploratory laparotomy  and right salpingo-oophorectomy and lysis of adhesions.   SURGEON:  Shela Nevin. Dion Body, MD   ASSISTANT:  Shelbie Proctor. Shawnie Pons, MD.   ANESTHESIA:  General.   FINDINGS:  Large right adnexal mass fixed to the posterior uterus and  pelvic sidewall.  Uterine adhesions up to uterus and small bowel.   SPECIMENS:  Right adnexal mass.   DISPOSITION:  Specimens to Pathology.   ESTIMATED BLOOD LOSS:  250.   COMPLICATIONS:  None.   Patient to recovery in stable condition.   INDICATIONS:  Ms. Mcclimans is a 30 year old gravida 1, para 0-0-1-0,  who presented with a sudden onset of right lower quadrant pain  associated with nausea and vomiting.  Upon evaluation in the emergency  room, she had a 4 x 4 complex mass on the right lower quadrant.  Ultrasound followed that up to further characterize the lesion and  showed that it was a hyperechoic mass which was suspected to be a  hemorrhagic cyst or endometrioma.  There were some solid and cystic  components to this mass.  The patient was admitted for pain control.  She was given morphine and Dilaudid with minimal improvement and which  was required around-the-clock.  Also, she was given Toradol which she  had moderate before some slight relief with that.  At the time of  surgery with ambulation and with movement, the patient had increased  abdominal pain.  Due to  the pain unrelieved with meds, she was consented  for diagnostic laparoscopy with possible ex lap and RSO.   PROCEDURE DETAIL:  Ms. Bejarano was taken back to the operating room  with IV running.  She then underwent general endotracheal anesthesia in  the supine position without complications.  She was then placed in the  dorsal lithotomy position with SCDs in place.  She was prepped and  draped in the normal sterile fashion.  A Foley catheter was placed with  a Graves speculum.  A single-tooth tenaculum and a Hulka uterine  manipulator was advanced through the cervical os.  The speculum and  single-tooth tenaculum were removed.   The patient was then draped and a 5-mm incision was made at the  umbilicus and a trocar was used to enter the abdomen.  A 5-mm trocar  bladeless was used to enter the abdomen.  Once intra-abdominal access  was confirmed, the CO2 gas was used to insufflate the abdomen.  The  abdomen was explored with the above findings.  A second port was  attempted to be placed on the  right lower quadrant and there was  significant active bleeding at that site which was grasped with a  hemostat and then it was tied off with 0 Vicryl with 2 interrupted  stitches.  There appeared to be a small hematoma which was stabilized  after the sutures were placed.  A suprapubic port was then placed with a  5-mm trocar and a blunt probe manipulator was used to attempt to  mobilize this mass, which upon doing so, trying to separate the uterus  from the mass.  The suspected endometrioma was entered and chocolate  fluid came out.  A 10-mm port was then used and with atraumatic grasper  and blunt probe manipulator, I attempted to manipulate the mass, just  identify it.  I did suction and irrigate some of the fluid that had come  from the mass.  I also did get pelvic washings prior to suction.  With  the gyrus, there were few adhesions to the appendix which appeared  normal that were lysed  with the gyrus and the Endo shears.  Some of the  posterior uterine adhesions were also lysed with the Endo shears.  I  cauterized the infundibulopelvic ligament and ligated that at the end.  I attempted to around the utero-ovarian ligament to dissect that down.   I decided to convert to exploratory laparotomy because the mass was just  too affixed to the pelvic sidewalls.  I could visualize the ureters, but  due to the extensive adhesion of this mass to the posterior uterus and  sidewall, I decided to open.   The patient had an old laparotomy scar that was incised with the muscle  and carried down to the underlying layer of the fascia with the Bovie.  The fascia was then extended laterally with the curved Mayo scissors.  I  attempted to identify the midline.  The muscles were adhesed at the  midline and grasped with 2 Kocher clamps to open and then separate.  The  peritoneum was identified and grasped with 2 hemostats and entered  sharply and extended suprapubically for exposure.  The Lenox Ahr retractor was then placed and the bowel was packed with 3  more laparotomy sponges.   The mass appeared to be in the right broad ligament, so I grasped the  round ligament and ligated it with Kochers x2 and sutured it with 0  Vicryl.  I then attempted to open up the retroperitoneal space, so I  could identify the ureters.  Around this time, the assistant, the Shawnie Pons  was called in to help with retraction and dissection of this mass.  We  opened up the retroperitoneal space.  The ureters were palpated and  identified.  The infundibulopelvic sidewall was clamped x2 and cut with  a curved Heaney and suture ligated with 0 Vicryl between the utero-  ovarian ligament that was also cut with curved Heaney, double clamped  with curved Heaney, and cut until I could dissect the mass off of the  upper portion of the uterus and pelvic sidewall.  The mass was then  amputated.  There was a defect  in the posterior broad ligament that was  bleeding.  Upon palpation, it seemed to expand close to the uterine  artery.  Because it was such a delicate space, we applied pressure for  approximately 5 minutes and that stopped the bleeding.  I did not want  to place any suture there and cause any trauma so the area was left  along.  Surgicel was packed into this area and it was hemostatic.  Intercede was then applied as well.  The left ovary and tube looked  normal.  The packing was removed from the abdomen.  All instruments were  removed.  There was some bleeding.  It looked like it was posterior to  the rectus muscles, but again we applied pressure and cauterized any  bleeding of the muscle with the Bovie.   The fascia was then reapproximated with 0 Vicryl in continuous running  fashion.  The subcutaneous space was reapproximated with 2-0 plain in a  continuous running fashion.  The incision was then closed with staples.  The 10-mm port was then closed with 0 Vicryl.  The fascia was closed  with 0 Vicryl and then there was some pulling and so that was released  with the Bovie and entered the subcu space and the skin was  reapproximated with staples.   Prior to starting the exploratory lap, the infraumbilical incision was  reapproximated with 4-0 Vicryl and was hemostatic.   All instrument, sponge, and needle counts were correct x3.  The Hulka  uterine manipulator was removed and a pressure dressing was applied to  the abdomen.  The patient tolerated the procedure well.  Upon awakening,  the patient per report was very anxious, hysterical.  Upon arrival to  the recovery room, the patient was holding and hugging on to one of the  surgery team due to possible anxiety or discomfort.  She responded well  to p.o. or to IV pain medicine in recovery room.      Pieter Partridge, MD  Electronically Signed     EBV/MEDQ  D:  03/11/2009  T:  03/11/2009  Job:  [id number]
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
## 2011-04-22 NOTE — Discharge Summary (Signed)
NAMEARISTA, KETTLEWELL         ACCOUNT NO.:  [id number]   MEDICAL RECORD NO.:  [id number]          PATIENT TYPE:  INP   LOCATION:  A306                          FACILITY:  [facility]   PHYSICIAN:  Skeet Latch, DO    DATE OF BIRTH:  1981/07/16   DATE OF ADMISSION:  08/11/2007  DATE OF DISCHARGE:  09/06/2008LH                               DISCHARGE SUMMARY   DISCHARGE DIAGNOSES:  1. Right thigh cellulitis/abscess.  2. Iron deficiency anemia.  3. History of post traumatic stress disorder.  4. History of thyroid cancer status post thyroidectomy.  5. History of asthma.   BRIEF HOSPITAL COURSE:  This is a 30 year old African-American female  with history of PTSD, thyroid cancer in which she underwent  thyroidectomy, underwent radiation and presented to the emergency room  with a complaint of right thigh pain and swelling.  The patient was seen  in the emergency room 1 day prior and was put on antibiotics and pain  medication and sent home.  The patient returned the next day with  worsening symptoms, with increasing pain, increasing size for her  cellulitis.  The patient initially states the pain was 3/10, and on  admission it was 10/10.  The patient had some fever and chills, nausea  as well as some vomiting.  The patient was admitted for her cellulitis  and placed on IV antibiotics.  These included vancomycin and Levaquin.  Blood cultures and wound cultures were also sent.  General surgery was  consulted.  Surgery saw the patient and I&D was performed, and the  patient had wound care daily.  An initial area of induration was 2 x 2  cm in diameter.  The patient tolerated the I&D well, and her IV  antibiotics for subsequently switched to p.o.  She was continued on  Levaquin.  Her vancomycin was discontinued.  The patient's blood  cultures were negative, and her wound culture was positive for MRSA.  The patient upon discharge was sent home on Bactrim, as well as  doxycycline orally.   As stated, the patient's condition improved, and  the patient was stable enough for discharge.   DISCHARGE MEDICATIONS:  The patient was discharged on the following  medications:  1. Paxil 40 mg daily.  2. Risperidone 0.5 mg three times a day.  3. Trazodone, previous dose q.h.s.  4. Levothroid 100 mcg daily.  5. Bactrim DS 1 tablet p.o. b.i.d.  6. Doxycycline 100 mg p.o. daily.  7. Lortab 5 mg p.o. q.4-6.  8. Ferrous sulfate 325 mg t.i.d.   DISCHARGE INSTRUCTIONS:  The patient is to maintain a regular diet.  The  patient is to follow with physical therapy daily, starting the day after  discharge, to increase activity slowly.  As stated, the patient was  given instructions regarding following up with physical therapy,  regarding wound care.  The patient was also follow up with Dr. Malvin Johns  on August 17, 2007, also.      Skeet Latch, DO  Electronically Signed     SM/MEDQ  D:  08/27/2007  T:  08/28/2007  Job:  [id number]   cc:  Barbaraann Barthel, M.D.  Fax: [phone]

## 2011-04-22 NOTE — Op Note (Signed)
NAME:  DOMINGUE, COLTRAIN NO.:  [id number]   MEDICAL RECORD NO.:  [id number]          PATIENT TYPE:  INP   LOCATION:  A306                          FACILITY:  [facility]   PHYSICIAN:  Barbaraann Barthel, M.D. DATE OF BIRTH:  1981-01-06   DATE OF PROCEDURE:  08/12/2007  DATE OF DISCHARGE:                               OPERATIVE REPORT   DIAGNOSIS:  Right groin (upper thigh) abscess.   PROCEDURE:  Incision and drainage with debridement of skin, subcutaneous  tissue and fascia.   SPECIMENS:  Cultures sent for aerobic and anaerobic growth, as well as  tissue sent in formalin as well as fresh.   NOTE:  This is a 30 year old black female nondiabetic, who stated that  approximately 48 hours before admission to the hospital she thought that  she might have been bitten by a spider.  She was admitted to the  hospitalist service.  She had an area of secondary cellulitis and  swelling, with an area that looked like it had been penetrated by  possibly a bite or a foreign object in her upper thigh right groin area.  Secondary cellulitis was in evidence.  The patient had been placed on  antibiotics and surgery was consulted.  We made plans to take her to the  operating room, after discussing the procedure in detail with her.  We  discussed complications, not limited to but including, bleeding and  infection; the possibility of further debridement might be necessary.  Informed consent was obtained.   GROSS OPERATIVE FINDINGS:  The patient had an area of loculated pus  which involved the skin, subcutaneous tissue and fascia of the right  groin area.  There was also noted some black granules; these were sent  with a fresh specimen.   We broke up all granulations and debrided all necrotic tissue widely.  A  generous incision at least 10 -12 cm long was required.  After debriding  the infected and necrotic tissue, we then irrigated with copious amounts  of saline solution and packed the  wound open with saline-soaked roll  gauze.  After irrigating and checking for hemostasis, and making sure  that the cultures were sent appropriately (as discussed with Dr. Laureen Ochs of  the pathology department), we placed a sterile dressing.   We will have physical therapy see this patient, with wound care.  We  will do lavage and packing b.i.d. beginning in the morning with  dressings.  The hospitalist service will continue to have this patient  on their service, and they will monitor her medications and alter her  antibiotics as per cultures.   At present she is on Levaquin as well as vancomycin.      Barbaraann Barthel, M.D.  Electronically Signed     WB/MEDQ  D:  08/12/2007  T:  08/12/2007  Job:  [id number]   cc:   Guerry Bruin, MD   [facility]
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
## 2011-04-25 NOTE — Discharge Summary (Signed)
NAME:  Kristina Li, SUITT NO.:  [id number]   MEDICAL RECORD NO.:  [id number]          PATIENT TYPE:  INP   LOCATION:  9320                          FACILITY:  [facility]   PHYSICIAN:  Pieter Partridge, MD   DATE OF BIRTH:  Mar 17, 1981   DATE OF ADMISSION:  03/10/2009  DATE OF DISCHARGE:  03/13/2009                               DISCHARGE SUMMARY   ADMITTING DIAGNOSIS:  Right lower quadrant pain.   PROCEDURES:  Diagnostic laparoscopy with conversion to exploratory  laparotomy, right salpingo-oophorectomy, and lysis of adhesions.   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   HOSPITAL COURSE:  Kristina Li was admitted for right lower quadrant  pain and on the imaging done prior to admission showed that she had a  right adnexal mass, 4 x 4 cm, had a cystic and solid components  consistent with hemorrhagic cyst versus endometrioma versus dermoid. She  was afebrile and her white count was normal.  Her pain was severe,  requiring IV narcotics round the clock.  Due to her poor pain control,  she was counseled on the diagnostic laparoscopy for exploration of this  adnexal mass that she presented with.   On March 11, 2009, she underwent the above procedure, which was  essentially uncomplicated and compared to an exploratory laparotomy just  because of adhesions and because of the size of the mass.  Please see  the operative report for the details, but essentially the mass, which  appeared to be an endometrioma was invaded in the broad ligament and  Surgicel was placed in the defect, which was actually close to the  uterine arteries on the right and there was no excessive bleeding or  hemorrhage.   Postoperatively, the patient did well physically.  She remained  afebrile.  Her pain was controlled on p.o. meds.  She was tolerating a  regular diet.   The patient has a history of anxiety and was on Paxil 80 mg daily.  On  postoperative day #1, the patient had an  outburst with the nursing  staff.  She felt that they were pushing her too fast and in the  discussion she ended up frightened to call 911, because she felt that  the nursing staff was being untruthful.  It was obvious that the patient  was quite anxious and per report after I left, she did have a full-blown  panic attacks.  A Psychiatry consult was set up prior to discharge.  Due  to the severe anxiety, she was given Ativan, which seemed to control her  symptoms.   The consultation was performed at [facility] and the assessment was anxiety  disorder and he recommended putting her on trazodone for at bedtime 50  mg and also continuing the Paxil 80 mg.  She lives in [place], but  currently goes to [place] for her psychiatric care, so via social  work, they were setting up some outpatient psych health closer to her  area.  Also, they recommended behavioral therapy for her posttraumatic  stress disorder symptoms.   At the time of discharge, the patient's hemoglobin was normal  postoperatively.  She did  not have any severe anemia at the time of  discharge.  Her staples were to be removed by the nursing staff prior to  discharge and she was to follow up in 1 week with myself and a  outpatient psych care was to be arranged within 2 weeks.   DISCHARGE MEDICATIONS:  She is discharged home on Dilaudid, Toradol,  trazodone 50 mg nightly as recommended per site.  She was to continue  Synthroid 100 mcg, Paxil 80 mg, and Cytomel.  Wound care was reviewed.  She is to follow up in 1 week for postop check.  Pelvic rest and no  heavy lifting and discharged to home.      Pieter Partridge, MD  Electronically Signed     EBV/MEDQ  D:  04/09/2009  T:  04/10/2009  Job:  [id number]

## 2011-04-25 NOTE — Discharge Summary (Signed)
NAME:  Kristina Li, Kristina Li NO.:  [id number]   MEDICAL RECORD NO.:  [id number]                   PATIENT TYPE:  IPS   LOCATION:  0507                                 [facility]   PHYSICIAN:  Jeanice Lim, M.D.              DATE OF BIRTH:  [birth year]   DATE OF ADMISSION:  03/06/2003  DATE OF DISCHARGE:  03/13/2003                                 DISCHARGE SUMMARY   IDENTIFYING DATA:  This is a 30 year old African-American female, single,  involuntarily admitted, having thoughts of wanting to walk in front of a car  and kill herself.  Quite agitated and suicidal at the time of admission.   MEDICATIONS:  Depo-Provera and acyclovir.   ALLERGIES:  ASPIRIN.   PHYSICAL EXAMINATION:  Essentially within normal limits.  Neurologically  nonfocal.   LABORATORY DATA:  Routine admission labs essentially within normal limits.   MENTAL STATUS EXAM:  Positive psychomotor slowing, cooperative,  hypervigilant.  Speech soft, low.  Mood depressed.  Affect anxious,  restricted.  Thought processes goal directed.  Positive paranoid ideation,  flashbacks, suicidal ideation with plan and intent and positive homicidal  ideation at the time of admission but no access to target or weapons.  Cognitively intact.  Judgment and insight fair to poor.   ADMISSION DIAGNOSES:   AXIS I:  1. Major depressive disorder, initial episode severe with psychotic     features.  2. Acute stress disorder, possible developing post-traumatic stress     disorder.   AXIS II:  None.   AXIS III:  1. Low back pain.  2. Asthma.  3. Seasonal allergies.  4. Herpes.   AXIS IV:  Moderate (stressors related to occupation).   AXIS V:  25/70.   HOSPITAL COURSE:  The patient was admitted and ordered routine p.r.n.  medications and underwent further monitoring.  Was initially placed on  Seroquel for severe agitation and Zoloft and Zoloft was optimized as well as  Seroquel.  The patient  reported having nightmares, paranoid ideation,  auditory hallucinations and difficulty sleeping and initially reported a  positive response to medication.  Then reported that she had some  oversedation at night, feeling groggy and did not want to take this  medication.  Around the same time, her symptoms appeared to improve,  appearing less agitated, less desperate, more able to cope in a healthy  manner with her stressors and denied acute psychotic symptoms.  Therefore,  Seroquel was discontinued and patient was continued on an antidepressant.  Family meeting was held.  The patient complained of some left lower quadrant  pain.  The patient's mother visited several times, was described as somewhat  agitated, angry and verbally abusive, likely distraught over daughter's  condition and what has happened to her.  The patient's condition  significantly improved with clinical intervention and, after family meeting  with mother and stepfather, patient was discharged home with her mother and  follow-up arranged  with [facility].   CONDITION ON DISCHARGE:  The patient was discharged in significantly  improved condition.  Mood was more euthymic.  Affect brighter.  Thought  processes goal directed.  Thought content negative for dangerous ideation or  psychotic symptoms.  The patient reported motivation to be compliant with  the aftercare plan.   DISCHARGE MEDICATIONS:  1. Keflex 500 mg b.i.d.  2. Flexeril 10 mg t.i.d.  3. Celebrex 200 mg b.i.d.  4. Zoloft 100 mg q.a.m.  5. Ambien 10 mg q.h.s. p.r.n.   FOLLOW UP:  The patient was to follow up with [facility].   DISCHARGE DIAGNOSES:   AXIS I:  1. Major depressive disorder, initial episode severe with psychotic     features.  2. Acute stress disorder, possible developing post-traumatic stress     disorder.   AXIS II:  None.   AXIS III:  1. Low back pain.  2. Asthma.  3. Seasonal  allergies.  4. Herpes.   AXIS IV:  Moderate (stressors related to occupation).   AXIS V:  Global Assessment of Functioning on discharge 55.                                               Jeanice Lim, M.D.    JEM/MEDQ  D:  03/29/2003  T:  04/02/2003  Job:  [id number]

## 2011-04-25 NOTE — Consult Note (Signed)
NAME:  Kristina Li, Kristina Li NO.:  [id number]   MEDICAL RECORD NO.:  [id number]          PATIENT TYPE:  INP   LOCATION:  9320                          [facility]ILITY[facility]   PHYSICIAN:  Antonietta Breach, M.D.  DATE OF BIRTH:  01/18/1981   DATE OF CONSULTATION:  DATE OF DISCHARGE:  03/13/2009                                 CONSULTATION   ADDENDUM:  While utilizing the Paxil and the trazodone, I would continue  to monitor for any nausea, loose stools or fatigue as side effects.      Antonietta Breach, M.D.  Electronically Signed     JW/MEDQ  D:  03/14/2009  T:  03/14/2009  Job:  [id number]
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
## 2011-04-28 ENCOUNTER — Emergency Department: Payer: Self-pay | Admitting: Emergency Medicine

## 2011-05-07 ENCOUNTER — Ambulatory Visit: Payer: Self-pay | Admitting: Obstetrics and Gynecology

## 2011-05-08 ENCOUNTER — Ambulatory Visit: Payer: Self-pay | Admitting: Obstetrics and Gynecology

## 2011-09-08 ENCOUNTER — Ambulatory Visit: Payer: Self-pay | Admitting: Internal Medicine

## 2011-09-12 LAB — URINE MICROSCOPIC-ADD ON

## 2011-09-12 LAB — URINALYSIS, ROUTINE W REFLEX MICROSCOPIC
Glucose, UA: NEGATIVE
Leukocytes, UA: NEGATIVE
Protein, ur: 30 — AB
Urobilinogen, UA: 0.2

## 2011-09-19 LAB — COMPREHENSIVE METABOLIC PANEL
ALT: 22
AST: 22
CO2: 27
Chloride: 104
GFR calc Af Amer: >60
GFR calc non Af Amer: >60
Potassium: 3.5
Sodium: 137
Total Bilirubin: 0.5

## 2011-09-19 LAB — DIFFERENTIAL
Basophils Absolute: 0
Basophils Absolute: 0
Basophils Absolute: 0
Basophils Relative: 0
Basophils Relative: 1
Eosinophils Absolute: 0
Eosinophils Absolute: 0
Eosinophils Absolute: 0.1
Eosinophils Relative: 0
Eosinophils Relative: 0
Eosinophils Relative: 0
Lymphocytes Relative: 8 — ABNORMAL LOW
Lymphs Abs: 0.9
Monocytes Absolute: 0.4
Monocytes Absolute: 0.6
Monocytes Absolute: 0.9 — ABNORMAL HIGH
Monocytes Absolute: 1 — ABNORMAL HIGH
Monocytes Relative: 8
Monocytes Relative: 9
Monocytes Relative: 9
Neutro Abs: 5.2
Neutrophils Relative %: 61

## 2011-09-19 LAB — CULTURE, ROUTINE-ABSCESS

## 2011-09-19 LAB — CBC
HCT: 28 — ABNORMAL LOW
HCT: 28.3 — ABNORMAL LOW
Hemoglobin: 9.2 — ABNORMAL LOW
Hemoglobin: 9.3 — ABNORMAL LOW
MCHC: 32.9
MCHC: 33.1
MCV: 82
MCV: 83
MCV: 83.8
RBC: 3.2 — ABNORMAL LOW
RBC: 3.37 — ABNORMAL LOW
RBC: 3.38 — ABNORMAL LOW
RBC: 3.92
RDW: 15.8 — ABNORMAL HIGH
RDW: 16.2 — ABNORMAL HIGH
WBC: 11 — ABNORMAL HIGH
WBC: 17.7 — ABNORMAL HIGH

## 2011-09-19 LAB — BASIC METABOLIC PANEL
BUN: 5 — ABNORMAL LOW
CO2: 30
Calcium: 8.4
Chloride: 108
Chloride: 109
GFR calc Af Amer: >60
GFR calc Af Amer: >60
GFR calc non Af Amer: >60
Glucose, Bld: 137 — ABNORMAL HIGH
Potassium: 3.6
Potassium: 3.8
Sodium: 141
Sodium: 141

## 2011-09-19 LAB — URINALYSIS, ROUTINE W REFLEX MICROSCOPIC
Glucose, UA: NEGATIVE
Glucose, UA: NEGATIVE
Hgb urine dipstick: NEGATIVE
Ketones, ur: NEGATIVE
Ketones, ur: NEGATIVE
Nitrite: NEGATIVE
Protein, ur: NEGATIVE
Specific Gravity, Urine: 1.025
Urobilinogen, UA: 0.2
pH: 6.5

## 2011-09-19 LAB — CULTURE, BLOOD (ROUTINE X 2)
Culture: NO GROWTH
Report Status: 9082008

## 2011-09-19 LAB — FUNGUS CULTURE W SMEAR: Fungal Smear: NONE SEEN

## 2011-09-19 LAB — WOUND CULTURE

## 2011-09-19 LAB — AFB CULTURE WITH SMEAR (NOT AT ARMC): Acid Fast Smear: NONE SEEN

## 2011-09-19 LAB — ANAEROBIC CULTURE

## 2011-09-19 LAB — IRON AND TIBC

## 2011-09-19 LAB — VITAMIN B12: Vitamin B-12: 432 (ref 211–911)

## 2011-10-09 ENCOUNTER — Ambulatory Visit: Payer: Self-pay | Admitting: Internal Medicine

## 2011-11-01 ENCOUNTER — Encounter: Payer: Self-pay | Admitting: *Deleted

## 2011-11-01 ENCOUNTER — Emergency Department (HOSPITAL_COMMUNITY)
Admission: EM | Admit: 2011-11-01 | Discharge: 2011-11-01 | Disposition: A | Discharge status: Home / Self Care | Payer: Medicaid Other | Attending: Emergency Medicine | Admitting: Emergency Medicine

## 2011-11-01 DIAGNOSIS — B373 Candidiasis of vulva and vagina: Secondary | ICD-10-CM | POA: Insufficient documentation

## 2011-11-01 DIAGNOSIS — B9689 Other specified bacterial agents as the cause of diseases classified elsewhere: Secondary | ICD-10-CM | POA: Insufficient documentation

## 2011-11-01 DIAGNOSIS — N760 Acute vaginitis: Secondary | ICD-10-CM

## 2011-11-01 DIAGNOSIS — A499 Bacterial infection, unspecified: Secondary | ICD-10-CM | POA: Insufficient documentation

## 2011-11-01 DIAGNOSIS — N72 Inflammatory disease of cervix uteri: Secondary | ICD-10-CM | POA: Insufficient documentation

## 2011-11-01 DIAGNOSIS — A64 Unspecified sexually transmitted disease: Secondary | ICD-10-CM

## 2011-11-01 DIAGNOSIS — Z85850 Personal history of malignant neoplasm of thyroid: Secondary | ICD-10-CM | POA: Insufficient documentation

## 2011-11-01 DIAGNOSIS — B3731 Acute candidiasis of vulva and vagina: Secondary | ICD-10-CM | POA: Insufficient documentation

## 2011-11-01 HISTORY — DX: Malignant neoplasm of thyroid gland: C73

## 2011-11-01 HISTORY — DX: Unspecified osteoarthritis, unspecified site: M19.90

## 2011-11-01 LAB — URINALYSIS, ROUTINE W REFLEX MICROSCOPIC
Glucose, UA: NEGATIVE mg/dL
Hgb urine dipstick: NEGATIVE
Leukocytes, UA: NEGATIVE
Specific Gravity, Urine: 1.02 (ref 1.005–1.030)
pH: 7 (ref 5.0–8.0)

## 2011-11-01 LAB — WET PREP, GENITAL: Trich, Wet Prep: NONE SEEN

## 2011-11-01 LAB — PREGNANCY, URINE: Preg Test, Ur: NEGATIVE

## 2011-11-01 MED ORDER — FLUCONAZOLE 100 MG PO TABS
150.0000 mg | ORAL_TABLET | Freq: Once | ORAL | Status: AC
  Administered 2011-11-01: 150 mg via ORAL
  Filled 2011-11-01: qty 2

## 2011-11-01 MED ORDER — METRONIDAZOLE 0.75 % VA GEL: 1.0000 | Freq: Two times a day (BID) | VAGINAL | Status: AC

## 2011-11-01 MED ORDER — AZITHROMYCIN 250 MG PO TABS
1000.0000 mg | ORAL_TABLET | Freq: Once | ORAL | Status: AC
  Administered 2011-11-01: 1000 mg via ORAL
  Filled 2011-11-01: qty 4

## 2011-11-01 MED ORDER — CEFTRIAXONE SODIUM 250 MG IJ SOLR
250.0000 mg | Freq: Once | INTRAMUSCULAR | Status: AC
  Administered 2011-11-01: 250 mg via INTRAMUSCULAR
  Filled 2011-11-01 (×2): qty 250

## 2011-11-01 MED ORDER — LIDOCAINE HCL (PF) 1 % IJ SOLN
INTRAMUSCULAR | Status: AC
  Administered 2011-11-01: 18:00:00
  Filled 2011-11-01: qty 5

## 2011-11-01 NOTE — ED Notes (Signed)
Pt discharged is stable condition

## 2011-11-01 NOTE — ED Notes (Signed)
Received report agree with previous assessment 

## 2011-11-01 NOTE — ED Notes (Signed)
Pt c/o green vaginal discharge. Pt states she caught her partner cheating on her and has recently developed symptoms. Pt also states she is having vaginal itching.

## 2011-11-01 NOTE — ED Provider Notes (Signed)
History  This chart was scribed for [employer]. Rubin Payor, MD by Bennett Scrape. This patient was seen in room APA09/APA09 and the patient's care was started at 5:15PM.  CSN: [id number] Arrival date & time: 11/01/2011  5:06 PM   First MD Initiated Contact with Patient 11/01/11 1708      Chief Complaint  Patient presents with  . SEXUALLY TRANSMITTED DISEASE     The history is provided by the patient. No language interpreter was used.   Kristina Li is a 30 y.o. female who presents to the Emergency Department complaining of one month of light green, bad smelling vaginal discharge with associated mild vaginal discomfort, tenderness, and itching, mild nausea and increased urination frequency. Pt stated that her partner cheated with STD infected contact. Pt denies dysuria and fever as associated symptoms. Pt states that she had once chlamydia before and experienced similar symptoms. Pt states that she is healthy otherwise. Pt has a h/o arthritis and thyroid cancer.  Past Medical History  Diagnosis Date  . Arthritis   . Thyroid cancer     Past Surgical History  Procedure Date  . Thyroidectomy, partial   . Cystectomy     No family history on file.  History  Substance Use Topics  . Smoking status: Never Smoker   . Smokeless tobacco: Not on file  . Alcohol Use: Yes     occasional    OB History    Grav Para Term Preterm Abortions TAB SAB Ect Mult Living                  Review of Systems A complete 10 system review of systems was obtained and is otherwise negative except as noted in the HPI.   Allergies  Aspirin and Ibuprofen  Home Medications   Current Outpatient Rx  Name Route Sig Dispense Refill  . METRONIDAZOLE 0.75 % VA GEL Vaginal Place 1 Applicatorful vaginally 2 (two) times daily. 70 g 0    Triage Vitals: BP 118/68  Pulse 95  Temp(Src) 98.4 F (36.9 C) (Oral)  Resp 20  Ht 5' 6.5" (1.689 m)  Wt 150 lb (68.04 kg)  BMI 23.85 kg/m2  SpO2  100%  Physical Exam  Nursing note and vitals reviewed. Constitutional: She is oriented to person, place, and time. She appears well-developed and well-nourished.  HENT:  Head: Normocephalic and atraumatic.  Eyes: EOM are normal. Pupils are equal, round, and reactive to light.  Neck: Neck supple. No tracheal deviation present.  Cardiovascular: Normal rate and regular rhythm.   Pulmonary/Chest: Effort normal and breath sounds normal. No respiratory distress.  Abdominal: Soft. She exhibits no distension. There is no tenderness.  Musculoskeletal: Normal range of motion.  Neurological: She is alert and oriented to person, place, and time.  Skin: Skin is warm and dry.  Psychiatric: She has a normal mood and affect. Her behavior is normal.    ED Course  Procedures (including critical care time)  DIAGNOSTIC STUDIES: Oxygen Saturation is 100% on room air, normal by my interpretation.    COORDINATION OF CARE: 5:17PM-Discussed treatment plan with patient at bedside and patient agreed.   Labs Reviewed  URINALYSIS, ROUTINE W REFLEX MICROSCOPIC - Abnormal; Notable for the following:    Appearance CLOUDY (*)    Ketones, ur TRACE (*)    All other components within normal limits  WET PREP, GENITAL - Abnormal; Notable for the following:    Yeast, Wet Prep FEW (*)    Clue Cells, Wet Prep  MANY (*)    WBC, Wet Prep HPF POC MANY (*)    All other components within normal limits  PREGNANCY, URINE  GC/CHLAMYDIA PROBE AMP, GENITAL  RPR  WET PREP, GENITAL   No results found.   1. STD (female)   2. Candidiasis of vagina   3. Bacterial vaginosis       MDM  Patient presents to the ER with vaginal discharge. She states her boyfriend was caught cheating. He also had a little itching. She has yeast and clue cells on file. White blood cells. She'll be treated for cervicitis along with Bextra vaginosis and candidiasis. She will followup with her doctor.      I personally performed the  services described in this documentation, which was scribed in my presence. The recorded information has been reviewed and considered.   Juliet Rude. Rubin Payor, MD 11/01/11 Windell Moment

## 2011-11-17 ENCOUNTER — Encounter (HOSPITAL_COMMUNITY): Payer: Self-pay | Admitting: *Deleted

## 2011-11-17 ENCOUNTER — Emergency Department (HOSPITAL_COMMUNITY)
Admission: EM | Admit: 2011-11-17 | Discharge: 2011-11-18 | Disposition: A | Discharge status: Home / Self Care | Payer: Medicaid Other | Attending: Emergency Medicine | Admitting: Emergency Medicine

## 2011-11-17 DIAGNOSIS — G8918 Other acute postprocedural pain: Secondary | ICD-10-CM | POA: Insufficient documentation

## 2011-11-17 DIAGNOSIS — R112 Nausea with vomiting, unspecified: Secondary | ICD-10-CM | POA: Insufficient documentation

## 2011-11-17 DIAGNOSIS — Z85850 Personal history of malignant neoplasm of thyroid: Secondary | ICD-10-CM | POA: Insufficient documentation

## 2011-11-17 DIAGNOSIS — K089 Disorder of teeth and supporting structures, unspecified: Secondary | ICD-10-CM | POA: Insufficient documentation

## 2011-11-17 MED ORDER — ONDANSETRON HCL 4 MG/2ML IJ SOLN
4.0000 mg | Freq: Once | INTRAMUSCULAR | Status: AC
  Administered 2011-11-17: 4 mg via INTRAVENOUS
  Filled 2011-11-17: qty 2

## 2011-11-17 MED ORDER — SODIUM CHLORIDE 0.9 % IV BOLUS (SEPSIS)
500.0000 mL | Freq: Once | INTRAVENOUS | Status: AC
  Administered 2011-11-17: 23:00:00 via INTRAVENOUS

## 2011-11-17 MED ORDER — ONDANSETRON 8 MG PO TBDP: 8.0000 mg | ORAL_TABLET | Freq: Three times a day (TID) | ORAL | Status: AC | PRN

## 2011-11-17 MED ORDER — OXYCODONE-ACETAMINOPHEN 5-325 MG PO TABS: 1.0000 | ORAL_TABLET | ORAL | Status: AC | PRN

## 2011-11-17 MED ORDER — MORPHINE SULFATE 4 MG/ML IJ SOLN
4.0000 mg | Freq: Once | INTRAMUSCULAR | Status: AC
  Administered 2011-11-17: 4 mg via INTRAVENOUS
  Filled 2011-11-17: qty 1

## 2011-11-17 NOTE — ED Notes (Signed)
Had oral surgery today.  Vomiting today after taking pain meds

## 2011-11-17 NOTE — ED Provider Notes (Signed)
This chart was scribed for Kristina Gaskins, MD scribed by Ellie Lunch. The patient was seen in room APA12/APA12    CSN: [id number] Arrival date & time: 11/17/2011 10:04 PM   First MD Initiated Contact with Patient 11/17/11 2210      Chief Complaint  Patient presents with  . Dental Pain     Patient is a 30 y.o. female presenting with tooth pain. The history is provided by the patient. No language interpreter was used.  Dental PainThe primary symptoms include mouth pain. The symptoms began 6 to 12 hours ago. The symptoms are unchanged. The symptoms are new. The symptoms occur constantly.  Additional symptoms do not include: trouble swallowing.  Kristina Li is a 30 y.o. female who presents to the Emergency Department complaining of dental pain. Pt had 2 lower wisdom teeth removed today. Pt was given pain medication, Norco, and reports reacting to the medicine with n/v. Pt denies rash or trouble swallowing. Pt reports a h/o of allergic rxn to Norco Current dental pain rated as severe.    Past Medical History  Diagnosis Date  . Thyroid cancer     Past Surgical History  Procedure Date  . Thyroidectomy, partial   . Cystectomy     No family history on file.  History  Substance Use Topics  . Smoking status: Never Smoker   . Smokeless tobacco: Not on file  . Alcohol Use: Yes     occasional    Review of Systems  HENT: Positive for dental problem. Negative for trouble swallowing.   Gastrointestinal: Positive for nausea and vomiting.  Skin: Negative for rash.  All other systems reviewed and are negative.    Allergies  Aspirin; Ibuprofen; Latex; Lorcet; and Shellfish allergy  Home Medications   Current Outpatient Rx  Name Route Sig Dispense Refill  . AMOXICILLIN 500 MG PO CAPS Oral Take 500 mg by mouth every 8 (eight) hours.      [name] HYDROCODONE-ACETAMINOPHEN 10-325 MG PO TABS Oral Take 1 tablet by mouth every 4 (four) hours as needed. For pain     .  LEVOTHYROXINE SODIUM 88 MCG PO TABS Oral Take 88 mcg by mouth daily.      [name] MEDROXYPROGESTERONE ACETATE 150 MG/ML IM SUSP Intramuscular Inject 150 mg into the muscle every 3 (three) months.      . NORETHINDRONE ACETATE 5 MG PO TABS Oral Take 5 mg by mouth daily.        BP 145/84  Pulse 109  Resp 20  Ht 5\' 6"  (1.676 m)  Wt 150 lb (68.04 kg)  BMI 24.21 kg/m2  SpO2 100%  Physical Exam CONSTITUTIONAL: Well developed/well nourished appears anxious HEAD AND FACE: Normocephalic/atraumatic EYES: EOMI/PERRL ENMT: Mucous membranes moist. No trismus, has packing in place to right lower molar, no angioedema NECK: supple no meningeal signs CV: S1/S2 noted, no murmurs/rubs/gallops noted LUNGS: Lungs are clear to auscultation bilaterally, no apparent distress ABDOMEN: soft, nontender, no rebound or guarding NEURO: Pt is awake/alert, moves all extremitiesx4 EXTREMITIES: pulses normal, full ROM SKIN: warm, color normal PSYCH: ANXIOUS  ED Course  Procedures  DIAGNOSTIC STUDIES: Oxygen Saturation is 100% on room air, normal by my interpretation.    COORDINATION OF CARE:  11:55 PM Pt improved No signs of systemic allergic rxn She can tolerate oxycodone She reports she is supposed to continue her oral packing and will call her dentist tomorrow  MDM  Nursing notes reviewed and considered in documentation    I personally performed  the services described in this documentation, which was scribed in my presence. The recorded information has been reviewed and considered.          Kristina Gaskins, MD 11/17/11 [phone]

## 2011-11-17 NOTE — ED Notes (Signed)
Patient refused to allow me to put the thermometer in her mouth due to oral surgery and complications that she is having from it.

## 2011-11-18 NOTE — ED Notes (Signed)
Mother accompanied discharge to drive home.  Patient and family verbalized understanding to f/u with dentist this morning.

## 2011-12-04 ENCOUNTER — Other Ambulatory Visit: Payer: Self-pay | Admitting: Obstetrics and Gynecology

## 2011-12-09 ENCOUNTER — Emergency Department (HOSPITAL_COMMUNITY)
Admission: EM | Admit: 2011-12-09 | Discharge: 2011-12-09 | Disposition: A | Discharge status: Home / Self Care | Payer: Medicaid Other | Attending: Emergency Medicine | Admitting: Emergency Medicine

## 2011-12-09 ENCOUNTER — Encounter (HOSPITAL_COMMUNITY): Payer: Self-pay

## 2011-12-09 DIAGNOSIS — B349 Viral infection, unspecified: Secondary | ICD-10-CM

## 2011-12-09 DIAGNOSIS — R509 Fever, unspecified: Secondary | ICD-10-CM | POA: Insufficient documentation

## 2011-12-09 DIAGNOSIS — R079 Chest pain, unspecified: Secondary | ICD-10-CM | POA: Insufficient documentation

## 2011-12-09 DIAGNOSIS — R630 Anorexia: Secondary | ICD-10-CM | POA: Insufficient documentation

## 2011-12-09 DIAGNOSIS — E079 Disorder of thyroid, unspecified: Secondary | ICD-10-CM | POA: Insufficient documentation

## 2011-12-09 DIAGNOSIS — R0602 Shortness of breath: Secondary | ICD-10-CM | POA: Insufficient documentation

## 2011-12-09 DIAGNOSIS — R110 Nausea: Secondary | ICD-10-CM | POA: Insufficient documentation

## 2011-12-09 DIAGNOSIS — B9789 Other viral agents as the cause of diseases classified elsewhere: Secondary | ICD-10-CM | POA: Insufficient documentation

## 2011-12-09 DIAGNOSIS — Z79899 Other long term (current) drug therapy: Secondary | ICD-10-CM | POA: Insufficient documentation

## 2011-12-09 DIAGNOSIS — R5381 Other malaise: Secondary | ICD-10-CM | POA: Insufficient documentation

## 2011-12-09 LAB — URINE MICROSCOPIC-ADD ON

## 2011-12-09 LAB — URINALYSIS, ROUTINE W REFLEX MICROSCOPIC
Glucose, UA: NEGATIVE mg/dL
Ketones, ur: NEGATIVE mg/dL
Nitrite: NEGATIVE
Protein, ur: NEGATIVE mg/dL
pH: 6.5 (ref 5.0–8.0)

## 2011-12-09 LAB — CBC
Hemoglobin: 13.3 g/dL (ref 12.0–15.0)
MCH: 27.7 pg (ref 26.0–34.0)
MCHC: 32.8 g/dL (ref 30.0–36.0)
Platelets: 258 10*3/uL (ref 150–400)
RBC: 4.8 MIL/uL (ref 3.87–5.11)

## 2011-12-09 LAB — PREGNANCY, URINE: Preg Test, Ur: NEGATIVE

## 2011-12-09 MED ORDER — SODIUM CHLORIDE 0.9 % IV BOLUS (SEPSIS)
1000.0000 mL | Freq: Once | INTRAVENOUS | Status: AC
  Administered 2011-12-09: 1000 mL via INTRAVENOUS

## 2011-12-09 MED ORDER — PREDNISONE 10 MG PO TABS: 20.0000 mg | ORAL_TABLET | Freq: Every day | ORAL | Status: DC

## 2011-12-09 MED ORDER — ONDANSETRON HCL 4 MG/2ML IJ SOLN
4.0000 mg | Freq: Once | INTRAMUSCULAR | Status: AC
  Administered 2011-12-09: 4 mg via INTRAVENOUS
  Filled 2011-12-09: qty 2

## 2011-12-09 MED ORDER — HYDROMORPHONE HCL PF 1 MG/ML IJ SOLN
1.0000 mg | Freq: Once | INTRAMUSCULAR | Status: AC
  Administered 2011-12-09: 1 mg via INTRAVENOUS
  Filled 2011-12-09: qty 1

## 2011-12-09 MED ORDER — KETOROLAC TROMETHAMINE 30 MG/ML IJ SOLN: 30.0000 mg | Freq: Once | INTRAMUSCULAR | Status: DC

## 2011-12-09 NOTE — ED Provider Notes (Addendum)
History  Scribed for [employer]. Colon Branch, MD, the patient was seen in room APA19/APA19. This chart was scribed by Candelaria Stagers. The patient's care started at 3:35PM    CSN: [id number]  Arrival date & time 12/09/11  1514   First MD Initiated Contact with Patient 12/09/11 1532      Chief Complaint  Patient presents with  . Fever    The history is provided by the patient.  Kristina Li is a 31 y.o. female who presents to the Emergency Department complaining of a fever that started about 24hrs ago.  Pt is also experiencing nausea, SOB, chest tenderness, congestion, fatigue, and loss of appetite.  She denies body aches, sore throat, cough, dysuria.  She states that she has taken alkaselter for the nausea with little relief.  She currently has no PCP and did not receive a flu shot.      Past Medical History  Diagnosis Date  . Thyroid cancer     Past Surgical History  Procedure Date  . Thyroidectomy, partial   . Cystectomy     History reviewed. No pertinent family history.  History  Substance Use Topics  . Smoking status: Never Smoker   . Smokeless tobacco: Not on file  . Alcohol Use: Yes     occasional    OB History    Grav Para Term Preterm Abortions TAB SAB Ect Mult Living                  Review of Systems  Constitutional: Positive for fever, chills, appetite change and fatigue.       10 Systems reviewed and are negative for acute change except as noted in the HPI.  HENT: Positive for congestion. Negative for sore throat.   Eyes: Negative for discharge and redness.  Respiratory: Positive for shortness of breath. Negative for cough.   Cardiovascular: Negative for chest pain.  Gastrointestinal: Positive for nausea.  Genitourinary: Negative for dysuria.  Musculoskeletal: Negative for back pain.  Skin: Negative for rash.  Neurological: Negative for syncope, weakness and numbness.  Psychiatric/Behavioral:       No behavior change.    Allergies  Aspirin;  Ibuprofen; Latex; Lorcet; and Shellfish allergy  Home Medications   Current Outpatient Rx  Name Route Sig Dispense Refill  . LEVOTHYROXINE SODIUM 88 MCG PO TABS Oral Take 88 mcg by mouth daily.      [name] MEDROXYPROGESTERONE ACETATE 150 MG/ML IM SUSP Intramuscular Inject 150 mg into the muscle every 3 (three) months.      . NORETHINDRONE ACETATE 5 MG PO TABS Oral Take 5 mg by mouth daily.        BP 126/79  Pulse 100  Temp(Src) 98.7 F (37.1 C) (Oral)  Resp 22  Ht 5' 6.5" (1.689 m)  Wt 140 lb (63.504 kg)  BMI 22.26 kg/m2  SpO2 100%  Physical Exam  Nursing note and vitals reviewed. Constitutional: She is oriented to person, place, and time. She appears well-developed and well-nourished.       Awake, alert, nontoxic appearance.  HENT:  Head: Normocephalic and atraumatic.  Mouth/Throat: Oropharynx is clear and moist. No oropharyngeal exudate.  Eyes: Conjunctivae are normal. Pupils are equal, round, and reactive to light. Right eye exhibits no discharge. Left eye exhibits no discharge.  Neck: Neck supple.  Cardiovascular: Normal rate and regular rhythm.  Exam reveals no gallop and no friction rub.   No murmur heard. Pulmonary/Chest: Effort normal. She has no wheezes. She has no rales.  Abdominal: Soft. There is no tenderness. There is no rebound.  Musculoskeletal: Normal range of motion. She exhibits no tenderness.  Neurological: She is alert and oriented to person, place, and time.       Mental status and motor strength appears baseline for patient and situation.  Skin: Skin is warm and dry. No rash noted.  Psychiatric: She has a normal mood and affect. Her behavior is normal.    ED Course  Procedures   DIAGNOSTIC STUDIES: Oxygen Saturation is 100% on room air, normal by my interpretation.    COORDINATION OF CARE:  3:43PM Ordered: Urinalysis, Routine w reflex microscopic ; Pregnancy, urine ; CBC ; sodium chloride 0.9 % bolus 1,000 mL ; ketorolac (TORADOL) 30 MG/ML injection  30 mg, HYDROmorphone (DILAUDID) injection 1 mg ; ondansetron (ZOFRAN) injection 4 mg ; Offer Fluids  4:58PM Recheck: Pt is feeling somewhat better.  Discussed course of care.   Results for orders placed during the hospital encounter of 12/09/11  URINALYSIS, ROUTINE W REFLEX MICROSCOPIC      Component Value Range   Color, Urine YELLOW  YELLOW    APPearance CLEAR  CLEAR    Specific Gravity, Urine 1.010  1.005 - 1.030    pH 6.5  5.0 - 8.0    Glucose, UA NEGATIVE  NEGATIVE (mg/dL)   Hgb urine dipstick TRACE (*) NEGATIVE    Bilirubin Urine NEGATIVE  NEGATIVE    Ketones, ur NEGATIVE  NEGATIVE (mg/dL)   Protein, ur NEGATIVE  NEGATIVE (mg/dL)   Urobilinogen, UA 0.2  0.0 - 1.0 (mg/dL)   Nitrite NEGATIVE  NEGATIVE    Leukocytes, UA TRACE (*) NEGATIVE   PREGNANCY, URINE      Component Value Range   Preg Test, Ur NEGATIVE    CBC      Component Value Range   WBC 8.9  4.0 - 10.5 (K/uL)   RBC 4.80  3.87 - 5.11 (MIL/uL)   Hemoglobin 13.3  12.0 - 15.0 (g/dL)   HCT 40.9  81.1 - 91.4 (%)   MCV 84.6  78.0 - 100.0 (fL)   MCH 27.7  26.0 - 34.0 (pg)   MCHC 32.8  30.0 - 36.0 (g/dL)   RDW 78.2  95.6 - 21.3 (%)   Platelets 258  150 - 400 (K/uL)  URINE MICROSCOPIC-ADD ON      Component Value Range   Squamous Epithelial / LPF FEW (*) RARE    WBC, UA 0-2  <3 (WBC/hpf)   RBC / HPF 0-2  <3 (RBC/hpf)   Bacteria, UA RARE  RARE    Urine-Other RARE YEAST     1700 Patient states she still feels weak but overall feels better.        MDM  Patient with fever, nausea and vomiting that began last night. Received IVF, analgesics, antiinflammatory, antiemetic with good relief.Pt feels improved after observation and/or treatment in ED.Pt stable in ED with no significant deterioration in condition.The patient appears reasonably screened and/or stabilized for discharge and I doubt any other medical condition or other [facility] requiring further screening, evaluation, or treatment in the ED at this time prior to  discharge.  I personally performed the services described in this documentation, which was scribed in my presence. The recorded information has been reviewed and considered.   MDM Reviewed: nursing note and vitals Interpretation: labs        Nicoletta Dress. Colon Branch, MD 12/09/11 1706  Nicoletta Dress. Colon Branch, MD 12/09/11 0865

## 2011-12-09 NOTE — ED Notes (Signed)
Complain of fever since last night. States she had some nausea and vomited a small amt this morning

## 2011-12-11 ENCOUNTER — Emergency Department (HOSPITAL_COMMUNITY)
Admission: EM | Admit: 2011-12-11 | Discharge: 2011-12-11 | Disposition: A | Discharge status: Home / Self Care | Payer: Medicaid Other | Attending: Emergency Medicine | Admitting: Emergency Medicine

## 2011-12-11 ENCOUNTER — Emergency Department (HOSPITAL_COMMUNITY): Payer: Medicaid Other

## 2011-12-11 ENCOUNTER — Encounter (HOSPITAL_COMMUNITY): Payer: Self-pay | Admitting: *Deleted

## 2011-12-11 DIAGNOSIS — Z85850 Personal history of malignant neoplasm of thyroid: Secondary | ICD-10-CM | POA: Insufficient documentation

## 2011-12-11 DIAGNOSIS — R509 Fever, unspecified: Secondary | ICD-10-CM | POA: Insufficient documentation

## 2011-12-11 DIAGNOSIS — R110 Nausea: Secondary | ICD-10-CM | POA: Insufficient documentation

## 2011-12-11 DIAGNOSIS — B9789 Other viral agents as the cause of diseases classified elsewhere: Secondary | ICD-10-CM | POA: Insufficient documentation

## 2011-12-11 DIAGNOSIS — Z9889 Other specified postprocedural states: Secondary | ICD-10-CM | POA: Insufficient documentation

## 2011-12-11 DIAGNOSIS — R059 Cough, unspecified: Secondary | ICD-10-CM | POA: Insufficient documentation

## 2011-12-11 DIAGNOSIS — R05 Cough: Secondary | ICD-10-CM | POA: Insufficient documentation

## 2011-12-11 DIAGNOSIS — B349 Viral infection, unspecified: Secondary | ICD-10-CM

## 2011-12-11 DIAGNOSIS — R040 Epistaxis: Secondary | ICD-10-CM | POA: Insufficient documentation

## 2011-12-11 LAB — URINALYSIS, ROUTINE W REFLEX MICROSCOPIC
Bilirubin Urine: NEGATIVE
Nitrite: NEGATIVE
Protein, ur: NEGATIVE mg/dL
pH: 6 (ref 5.0–8.0)

## 2011-12-11 LAB — CBC
HCT: 37.5 % (ref 36.0–46.0)
MCH: 27.4 pg (ref 26.0–34.0)
MCHC: 33.1 g/dL (ref 30.0–36.0)
MCV: 83 fL (ref 78.0–100.0)
Platelets: 266 10*3/uL (ref 150–400)
RDW: 15 % (ref 11.5–15.5)

## 2011-12-11 LAB — DIFFERENTIAL
Basophils Absolute: 0 10*3/uL (ref 0.0–0.1)
Basophils Relative: 0 % (ref 0–1)
Eosinophils Absolute: 0 10*3/uL (ref 0.0–0.7)
Eosinophils Relative: 0 % (ref 0–5)
Lymphocytes Relative: 17 % (ref 12–46)
Monocytes Absolute: 1.1 10*3/uL — ABNORMAL HIGH (ref 0.1–1.0)

## 2011-12-11 LAB — BASIC METABOLIC PANEL
BUN: 6 mg/dL (ref 6–23)
CO2: 23 mEq/L (ref 19–32)
Calcium: 10.1 mg/dL (ref 8.4–10.5)
Chloride: 107 mEq/L (ref 96–112)
Creatinine, Ser: 0.87 mg/dL (ref 0.50–1.10)

## 2011-12-11 MED ORDER — ONDANSETRON HCL 4 MG/2ML IJ SOLN
4.0000 mg | Freq: Once | INTRAMUSCULAR | Status: AC
  Administered 2011-12-11: 4 mg via INTRAVENOUS
  Filled 2011-12-11: qty 2

## 2011-12-11 MED ORDER — SODIUM CHLORIDE 0.9 % IV SOLN
Freq: Once | INTRAVENOUS | Status: AC
  Administered 2011-12-11: 20:00:00 via INTRAVENOUS

## 2011-12-11 MED ORDER — MORPHINE SULFATE 4 MG/ML IJ SOLN
4.0000 mg | Freq: Once | INTRAMUSCULAR | Status: AC
  Administered 2011-12-11: 4 mg via INTRAVENOUS
  Filled 2011-12-11: qty 1

## 2011-12-11 MED ORDER — ONDANSETRON HCL 4 MG PO TABS: 4.0000 mg | ORAL_TABLET | Freq: Four times a day (QID) | ORAL | Status: AC

## 2011-12-11 NOTE — ED Notes (Signed)
Feels weak, short of breath, headache and back hurt.

## 2011-12-11 NOTE — ED Provider Notes (Addendum)
History     CSN: [id number]  Arrival date & time 12/11/11  1903   First MD Initiated Contact with Patient 12/11/11 1934      Chief Complaint  Patient presents with  . Shortness of Breath    (Consider location/radiation/quality/duration/timing/severity/associated sxs/prior treatment) HPI Comments: States she was here 2 days ago with similar sxs.  She's had no vomiting or diarrhea since her last ED visit though.  ? subj fever.    States she is coughing up greenish phlegm.  Patient is a 31 y.o. female presenting with shortness of breath. The history is provided by the patient. No language interpreter was used.  Shortness of Breath  Episode onset: 4 days ago. The problem has been gradually improving. The symptoms are relieved by nothing. The symptoms are aggravated by nothing. Associated symptoms include a fever, cough and shortness of breath. Pertinent negatives include no chest pain. Urine output has decreased. The last void occurred 6 to 12 hours ago. There were no sick contacts. Recently, medical care has been given at this facility. Services received include medications given.    Past Medical History  Diagnosis Date  . Thyroid cancer     Past Surgical History  Procedure Date  . Thyroidectomy, partial   . Cystectomy     History reviewed. No pertinent family history.  History  Substance Use Topics  . Smoking status: Never Smoker   . Smokeless tobacco: Not on file  . Alcohol Use: No     occasional    OB History    Grav Para Term Preterm Abortions TAB SAB Ect Mult Living                  Review of Systems  Constitutional: Positive for fever and chills.  HENT: Positive for nosebleeds.   Respiratory: Positive for cough and shortness of breath.   Cardiovascular: Negative for chest pain.  Gastrointestinal: Positive for nausea. Negative for vomiting and diarrhea.  Genitourinary: Negative for dysuria, urgency, frequency and flank pain.  All other systems reviewed and are  negative.    Allergies  Aspirin; Ibuprofen; Latex; Lorcet; Shellfish allergy; and Tape  Home Medications   Current Outpatient Rx  Name Route Sig Dispense Refill  . ALKA-SELTZER PLUS COLD PO Oral Take 2 tablets by mouth as needed. Dissolved in water/juice for cold/allergy symptoms     . LEVOTHYROXINE SODIUM 88 MCG PO TABS Oral Take 88 mcg by mouth daily.      . NORETHINDRONE ACETATE 5 MG PO TABS Oral Take 5 mg by mouth daily.      [name] PREDNISONE 10 MG PO TABS Oral Take 2 tablets (20 mg total) by mouth daily. 10 tablet 0  . MEDROXYPROGESTERONE ACETATE 150 MG/ML IM SUSP Intramuscular Inject 150 mg into the muscle every 3 (three) months.        BP 124/103  Pulse 107  Temp(Src) 99.6 F (37.6 C) (Oral)  Resp 26  Wt 147 lb (66.679 kg)  SpO2 100%  Physical Exam  Nursing note and vitals reviewed. Constitutional: She is oriented to person, place, and time. She appears well-developed and well-nourished. No distress.  HENT:  Head: Normocephalic and atraumatic.  Eyes: EOM are normal.  Neck: Normal range of motion.  Cardiovascular: Normal rate, regular rhythm and normal heart sounds.   Pulmonary/Chest: Effort normal and breath sounds normal. No accessory muscle usage. Not tachypneic. No respiratory distress. She has no decreased breath sounds. She has no wheezes. She has no rhonchi. She has no  rales. She exhibits no tenderness.  Abdominal: Soft. She exhibits no distension. There is no tenderness.  Musculoskeletal: Normal range of motion.  Neurological: She is alert and oriented to person, place, and time.  Skin: Skin is warm and dry.  Psychiatric: She has a normal mood and affect. Judgment normal.    ED Course  Procedures (including critical care time)   Labs Reviewed  BASIC METABOLIC PANEL  CBC  DIFFERENTIAL  URINALYSIS, ROUTINE W REFLEX MICROSCOPIC  PREGNANCY, URINE   No results found.   No diagnosis found.    MDM          Worthy Rancher, PA 12/11/11  2206  Worthy Rancher, PA 01/29/12 [phone]

## 2011-12-13 NOTE — ED Provider Notes (Signed)
Medical screening examination/treatment/procedure(s) were performed by non-physician practitioner and as supervising physician I was immediately available for consultation/collaboration. Devoria Albe, MD, Armando Gang   Ward Givens, MD 12/13/11 [phone]

## 2011-12-16 ENCOUNTER — Emergency Department (HOSPITAL_COMMUNITY)
Admission: EM | Admit: 2011-12-16 | Discharge: 2011-12-16 | Disposition: A | Discharge status: Home / Self Care | Payer: Medicaid Other | Attending: Emergency Medicine | Admitting: Emergency Medicine

## 2011-12-16 ENCOUNTER — Emergency Department (HOSPITAL_COMMUNITY): Payer: Medicaid Other

## 2011-12-16 ENCOUNTER — Encounter (HOSPITAL_COMMUNITY): Payer: Self-pay

## 2011-12-16 DIAGNOSIS — R209 Unspecified disturbances of skin sensation: Secondary | ICD-10-CM | POA: Insufficient documentation

## 2011-12-16 DIAGNOSIS — R0602 Shortness of breath: Secondary | ICD-10-CM | POA: Insufficient documentation

## 2011-12-16 DIAGNOSIS — Z85850 Personal history of malignant neoplasm of thyroid: Secondary | ICD-10-CM | POA: Insufficient documentation

## 2011-12-16 DIAGNOSIS — M79609 Pain in unspecified limb: Secondary | ICD-10-CM | POA: Insufficient documentation

## 2011-12-16 DIAGNOSIS — J40 Bronchitis, not specified as acute or chronic: Secondary | ICD-10-CM | POA: Insufficient documentation

## 2011-12-16 DIAGNOSIS — R05 Cough: Secondary | ICD-10-CM | POA: Insufficient documentation

## 2011-12-16 DIAGNOSIS — R059 Cough, unspecified: Secondary | ICD-10-CM | POA: Insufficient documentation

## 2011-12-16 LAB — CBC
MCH: 27.1 pg (ref 26.0–34.0)
MCHC: 33.2 g/dL (ref 30.0–36.0)
MCV: 81.8 fL (ref 78.0–100.0)
Platelets: 284 10*3/uL (ref 150–400)
RBC: 4.68 MIL/uL (ref 3.87–5.11)

## 2011-12-16 LAB — BASIC METABOLIC PANEL
BUN: 7 mg/dL (ref 6–23)
Calcium: 9.1 mg/dL (ref 8.4–10.5)
GFR calc non Af Amer: 77 mL/min — ABNORMAL LOW (ref >90)
Glucose, Bld: 93 mg/dL (ref 70–99)
Sodium: 138 mEq/L (ref 135–145)

## 2011-12-16 LAB — DIFFERENTIAL
Basophils Relative: 0 % (ref 0–1)
Eosinophils Absolute: 0.1 10*3/uL (ref 0.0–0.7)
Eosinophils Relative: 1 % (ref 0–5)
Lymphs Abs: 2.2 10*3/uL (ref 0.7–4.0)
Monocytes Relative: 6 % (ref 3–12)

## 2011-12-16 MED ORDER — LORAZEPAM 2 MG/ML IJ SOLN
1.0000 mg | Freq: Once | INTRAMUSCULAR | Status: AC
  Administered 2011-12-16: 1 mg via INTRAVENOUS
  Filled 2011-12-16: qty 1

## 2011-12-16 MED ORDER — ONDANSETRON HCL 4 MG/2ML IJ SOLN
4.0000 mg | Freq: Once | INTRAMUSCULAR | Status: AC
  Administered 2011-12-16: 4 mg via INTRAVENOUS
  Filled 2011-12-16: qty 2

## 2011-12-16 MED ORDER — BENZONATATE 100 MG PO CAPS: 100.0000 mg | ORAL_CAPSULE | Freq: Three times a day (TID) | ORAL | Status: AC

## 2011-12-16 MED ORDER — SODIUM CHLORIDE 0.9 % IV SOLN
INTRAVENOUS | Status: DC
  Administered 2011-12-16: 10:00:00 via INTRAVENOUS

## 2011-12-16 MED ORDER — MORPHINE SULFATE 2 MG/ML IJ SOLN
2.0000 mg | Freq: Once | INTRAMUSCULAR | Status: AC
  Administered 2011-12-16: 2 mg via INTRAVENOUS
  Filled 2011-12-16: qty 1

## 2011-12-16 NOTE — ED Notes (Signed)
Pt reports cough/congestion, fever, n/v since Dec 30th.  Pt reports some tingling in her rt arm that started yesterday.

## 2011-12-16 NOTE — ED Provider Notes (Signed)
History   Scribed for Kristina Stairs, MD, the patient was seen in [facility]. The chart was scribed by Gilman Schmidt. The patients care was started at 9:30 AM.   CSN: [id number]  Arrival date & time 12/16/11  0917   First MD Initiated Contact with Patient 12/16/11 [phone]      Chief Complaint  Patient presents with  . Cough  . Arm Pain  . Nausea    (Consider location/radiation/quality/duration/timing/severity/associated sxs/prior treatment) Patient is a 31 y.o. female presenting with cough and arm pain.  Cough Associated symptoms include chills.  Arm Pain   Kristina Li is a 31 y.o. female with a history of Thyroid cancer who presents to the Emergency Department complaining of productive cough (green mucous) onset nine days. Also notes congestion, fever, right eye pressure, and nausea, vomiting. Pt also reports numbness, pain, and tingling in right arm onset last night. Pt reports taking Depo-Provera. There are no other associated symptoms and no other alleviating or aggravating factors.   Past Medical History  Diagnosis Date  . Thyroid cancer     Past Surgical History  Procedure Date  . Thyroidectomy, partial   . Cystectomy     No family history on file.  History  Substance Use Topics  . Smoking status: Never Smoker   . Smokeless tobacco: Not on file  . Alcohol Use: No     occasional    OB History    Grav Para Term Preterm Abortions TAB SAB Ect Mult Living                  Review of Systems  Constitutional: Positive for fever and chills.  HENT: Positive for congestion.   Respiratory: Positive for cough.   Gastrointestinal: Positive for nausea and vomiting.  Musculoskeletal:       Arm pain  Neurological: Positive for numbness.  All other systems reviewed and are negative.    Allergies  Aspirin; Ibuprofen; Latex; Lorcet; Shellfish allergy; and Tape  Home Medications   Current Outpatient Rx  Name Route Sig Dispense Refill  . ALKA-SELTZER  PLUS COLD PO Oral Take 2 tablets by mouth as needed. Dissolved in water/juice for cold/allergy symptoms     . LEVOTHYROXINE SODIUM 88 MCG PO TABS Oral Take 88 mcg by mouth daily.      [name] MEDROXYPROGESTERONE ACETATE 150 MG/ML IM SUSP Intramuscular Inject 150 mg into the muscle every 3 (three) months.      . NORETHINDRONE ACETATE 5 MG PO TABS Oral Take 5 mg by mouth daily.      [name] ONDANSETRON HCL 4 MG PO TABS Oral Take 1 tablet (4 mg total) by mouth every 6 (six) hours. 12 tablet 0  . PREDNISONE 10 MG PO TABS Oral Take 2 tablets (20 mg total) by mouth daily. 10 tablet 0    BP 145/90  Pulse 61  Temp(Src) 97.5 F (36.4 C) (Oral)  Resp 18  SpO2 100%  Physical Exam  Nursing note and vitals reviewed. Constitutional: She is oriented to person, place, and time. She appears well-developed and well-nourished.  Non-toxic appearance. She does not have a sickly appearance.  HENT:  Head: Normocephalic and atraumatic.  Eyes: Conjunctivae, EOM and lids are normal. Pupils are equal, round, and reactive to light. No scleral icterus.  Neck: Trachea normal and normal range of motion. Neck supple.  Cardiovascular: Normal rate, regular rhythm and normal heart sounds.   Pulmonary/Chest: Effort normal. She has rhonchi in the left upper field and the  left lower field.  Abdominal: Soft. Normal appearance. There is no tenderness. There is no rebound, no guarding and no CVA tenderness.  Musculoskeletal: Normal range of motion.  Neurological: She is alert and oriented to person, place, and time. She has normal strength.  Skin: Skin is warm, dry and intact. No rash noted.    ED Course  Procedures (including critical care time) 31 year old, female, with a history of thyroid cancer, and thyroid removal.  Take Synthroid, presents to emergency department with a nonproductive cough, generalized myalgias, and hyperventilating.  She's got rhonchi posteriorly on the left-hand side.  Otherwise, he does not appear toxic.   We'll perform a chest x-ray, and laboratory testing, and give her anxiolytics, and an analgesic for her symptoms.  Labs Reviewed - No data to display No results found.   No diagnosis found.  DIAGNOSTIC STUDIES: Oxygen Saturation is 100% on room air, normal by my interpretation.    LABS Results for orders placed during the hospital encounter of 12/16/11  CBC      Component Value Range   WBC 5.2  4.0 - 10.5 (K/uL)   RBC 4.68  3.87 - 5.11 (MIL/uL)   Hemoglobin 12.7  12.0 - 15.0 (g/dL)   HCT 16.1  09.6 - 04.5 (%)   MCV 81.8  78.0 - 100.0 (fL)   MCH 27.1  26.0 - 34.0 (pg)   MCHC 33.2  30.0 - 36.0 (g/dL)   RDW 40.9  81.1 - 91.4 (%)   Platelets 284  150 - 400 (K/uL)  DIFFERENTIAL      Component Value Range   Neutrophils Relative 51  43 - 77 (%)   Neutro Abs 2.6  1.7 - 7.7 (K/uL)   Lymphocytes Relative 42  12 - 46 (%)   Lymphs Abs 2.2  0.7 - 4.0 (K/uL)   Monocytes Relative 6  3 - 12 (%)   Monocytes Absolute 0.3  0.1 - 1.0 (K/uL)   Eosinophils Relative 1  0 - 5 (%)   Eosinophils Absolute 0.1  0.0 - 0.7 (K/uL)   Basophils Relative 0  0 - 1 (%)   Basophils Absolute 0.0  0.0 - 0.1 (K/uL)  BASIC METABOLIC PANEL      Component Value Range   Sodium 138  135 - 145 (mEq/L)   Potassium 3.2 (*) 3.5 - 5.1 (mEq/L)   Chloride 105  96 - 112 (mEq/L)   CO2 23  19 - 32 (mEq/L)   Glucose, Bld 93  70 - 99 (mg/dL)   BUN 7  6 - 23 (mg/dL)   Creatinine, Ser 7.82  0.50 - 1.10 (mg/dL)   Calcium 9.1  8.4 - 95.6 (mg/dL)   GFR calc non Af Amer 77 (*) >90 (mL/min)   GFR calc Af Amer 89 (*) >90 (mL/min)   Radiology: DG Chest 2 View. Reviewed by me. IMPRESSION: No active cardiopulmonary disease. Original Report Authenticated By: Cyndie Chime, M.D.   COORDINATION OF CARE: 9:30am:  - Patient evaluated by ED physician, Ativan, Morphine, Zofran, DG Chest ordered   11:05 AM sxs resolved    MDM  Bronchitis No pneumonia.  No respiratory distress.  No hypoxia.  No toxicity   I personally  performed the services described in this documentation, which was scribed in my presence. The recorded information has been reviewed and considered.       Kristina Stairs, MD 12/16/11 1105

## 2011-12-19 ENCOUNTER — Emergency Department (HOSPITAL_COMMUNITY): Payer: Medicaid Other

## 2011-12-19 ENCOUNTER — Emergency Department (HOSPITAL_COMMUNITY)
Admission: EM | Admit: 2011-12-19 | Discharge: 2011-12-19 | Disposition: A | Discharge status: Home / Self Care | Payer: Medicaid Other | Attending: Emergency Medicine | Admitting: Emergency Medicine

## 2011-12-19 ENCOUNTER — Encounter (HOSPITAL_COMMUNITY): Payer: Self-pay

## 2011-12-19 DIAGNOSIS — G5600 Carpal tunnel syndrome, unspecified upper limb: Secondary | ICD-10-CM | POA: Insufficient documentation

## 2011-12-19 DIAGNOSIS — G5601 Carpal tunnel syndrome, right upper limb: Secondary | ICD-10-CM

## 2011-12-19 DIAGNOSIS — Z85850 Personal history of malignant neoplasm of thyroid: Secondary | ICD-10-CM | POA: Insufficient documentation

## 2011-12-19 MED ORDER — OXYCODONE-ACETAMINOPHEN 5-325 MG PO TABS
1.0000 | ORAL_TABLET | Freq: Once | ORAL | Status: AC
  Administered 2011-12-19: 1 via ORAL
  Filled 2011-12-19: qty 1

## 2011-12-19 MED ORDER — OXYCODONE-ACETAMINOPHEN 5-325 MG PO TABS: 1.0000 | ORAL_TABLET | Freq: Four times a day (QID) | ORAL | Status: AC | PRN

## 2011-12-19 MED ORDER — ONDANSETRON HCL 4 MG PO TABS
4.0000 mg | ORAL_TABLET | Freq: Once | ORAL | Status: AC
  Administered 2011-12-19: 4 mg via ORAL
  Filled 2011-12-19: qty 1

## 2011-12-19 MED ORDER — ONDANSETRON HCL 4 MG PO TABS: 4.0000 mg | ORAL_TABLET | Freq: Four times a day (QID) | ORAL | Status: AC

## 2011-12-19 MED ORDER — PREDNISONE 20 MG PO TABS
60.0000 mg | ORAL_TABLET | Freq: Once | ORAL | Status: AC
  Administered 2011-12-19: 60 mg via ORAL
  Filled 2011-12-19: qty 3

## 2011-12-19 MED ORDER — PREDNISONE 10 MG PO TABS: ORAL_TABLET | ORAL | Status: DC

## 2011-12-19 NOTE — ED Provider Notes (Signed)
History     CSN: [id number]  Arrival date & time 12/19/11  1734   First MD Initiated Contact with Patient 12/19/11 1954      Chief Complaint  Patient presents with  . Hand Pain    (Consider location/radiation/quality/duration/timing/severity/associated sxs/prior treatment) HPI Comments: Patient reports that for the past week she has been having numbness and tingling in her right hand extending at times into the fingers. She complains of her fingers feeling cold to touch. On last evening the pain moved from the fingers and wrist up to the elbow. The numbness and tingling syncope that worse and was accompanied by discomfort. Patient states she was up for several hours during the night because of this pain and discomfort and it" freaked me out". Patient presents at this point for evaluation of this numbness and tingling of her right arm and wrist.  Patient is a 31 y.o. female presenting with hand pain. The history is provided by the patient.  Hand Pain Associated symptoms include numbness. Pertinent negatives include no abdominal pain, arthralgias, chest pain, coughing or neck pain.    Past Medical History  Diagnosis Date  . Thyroid cancer     Past Surgical History  Procedure Date  . Thyroidectomy, partial   . Cystectomy     No family history on file.  History  Substance Use Topics  . Smoking status: Never Smoker   . Smokeless tobacco: Not on file  . Alcohol Use: No     occasional    OB History    Grav Para Term Preterm Abortions TAB SAB Ect Mult Living                  Review of Systems  Constitutional: Negative for activity change.       All ROS Neg except as noted in HPI  HENT: Negative for nosebleeds and neck pain.   Eyes: Negative for photophobia and discharge.  Respiratory: Negative for cough, shortness of breath and wheezing.   Cardiovascular: Negative for chest pain and palpitations.  Gastrointestinal: Negative for abdominal pain and blood in stool.    Genitourinary: Negative for dysuria, frequency and hematuria.  Musculoskeletal: Negative for back pain and arthralgias.  Skin: Negative.   Neurological: Positive for numbness. Negative for dizziness, seizures and speech difficulty.  Psychiatric/Behavioral: Negative for hallucinations and confusion. The patient is nervous/anxious.     Allergies  Aspirin; Ibuprofen; Latex; Shellfish allergy; Lorcet; and Tape  Home Medications   Current Outpatient Rx  Name Route Sig Dispense Refill  . BENZONATATE 100 MG PO CAPS Oral Take 1 capsule (100 mg total) by mouth every 8 (eight) hours. 21 capsule 0  . ALKA-SELTZER PLUS COLD PO Oral Take 2 tablets by mouth as needed. Dissolved in water/juice for cold/allergy symptoms     . LEVOTHYROXINE SODIUM 88 MCG PO TABS Oral Take 88 mcg by mouth daily.      [name] MEDROXYPROGESTERONE ACETATE 150 MG/ML IM SUSP Intramuscular Inject 150 mg into the muscle every 3 (three) months.      . NORETHINDRONE ACETATE 5 MG PO TABS Oral Take 5 mg by mouth daily.      [name] ONDANSETRON HCL 4 MG PO TABS Oral Take 1 tablet (4 mg total) by mouth every 6 (six) hours. 12 tablet 0  . ONDANSETRON HCL 4 MG PO TABS Oral Take 1 tablet (4 mg total) by mouth every 6 (six) hours. 12 tablet 0  . OXYCODONE-ACETAMINOPHEN 5-325 MG PO TABS Oral Take 1 tablet by  mouth every 6 (six) hours as needed for pain. 15 tablet 0  . PREDNISONE 10 MG PO TABS  6,5,4,3,2,1 - Take with food 21 tablet 0    BP 138/77  Pulse 101  Temp(Src) 97.8 F (36.6 C) (Oral)  Resp 20  Ht 5\' 7"  (1.702 m)  Wt 145 lb (65.772 kg)  BMI 22.71 kg/m2  SpO2 100%  Physical Exam  Nursing note and vitals reviewed. Constitutional: She is oriented to person, place, and time. She appears well-developed and well-nourished.  Non-toxic appearance.  HENT:  Head: Normocephalic.  Right Ear: Tympanic membrane and external ear normal.  Left Ear: Tympanic membrane and external ear normal.  Eyes: EOM and lids are normal. Pupils are equal,  round, and reactive to light.  Neck: Normal range of motion. Neck supple. Carotid bruit is not present.  Cardiovascular: Normal rate, regular rhythm, normal heart sounds, intact distal pulses and normal pulses.   Pulmonary/Chest: Breath sounds normal. No respiratory distress.  Abdominal: Soft. Bowel sounds are normal. There is no tenderness. There is no guarding.  Musculoskeletal: Normal range of motion.       Patient has full range of motion of the right shoulder and elbow. There is pain to percussion over the right wrist. There is a positive Tinel's sign. There is good capillary refill of the fingers of the right hand. The radial pulses are symmetrical.  Lymphadenopathy:       Head (right side): No submandibular adenopathy present.       Head (left side): No submandibular adenopathy present.    She has no cervical adenopathy.  Neurological: She is alert and oriented to person, place, and time. She has normal strength. No cranial nerve deficit or sensory deficit.  Skin: Skin is warm and dry.  Psychiatric: She has a normal mood and affect. Her speech is normal.    ED Course  Procedures (including critical care time)  Labs Reviewed - No data to display Dg Wrist Complete Right  12/19/2011  *RADIOLOGY REPORT*  Clinical Data: Pain, cold sensation right upper extremity  RIGHT WRIST - COMPLETE 3+ VIEW  Comparison: Right hand radiographs 11/14/2010  Findings: Bone mineralization normal. Joint spaces preserved. No fracture, dislocation, or bone destruction.  IMPRESSION: Normal exam.  Original Report Authenticated By: Lollie Marrow, M.D.   Dg Hand Complete Right  12/19/2011  *RADIOLOGY REPORT*  Clinical Data: Numbness, pain and cold sensation in the right upper extremity for 1 week.  RIGHT HAND - COMPLETE 3+ VIEW  Comparison: Plain films of the right hand 11/14/2010.  Findings: Imaged bones, joints and soft tissues appear normal.  IMPRESSION: Negative exam.  Original Report Authenticated By: Bernadene Bell.  D'ALESSIO, M.D.     1. Carpal tunnel syndrome of right wrist       MDM  I have reviewed nursing notes, vital signs, and all appropriate lab and imaging results for this patient. Patient fitted with a wrist cockup splint for the right hand. Prescription for prednisone and Percocet given to the patient. Patient is to see Dr. Izora Ribas for hand evaluation.       Kathie Dike, [place] 12/19/11 2030

## 2011-12-19 NOTE — ED Notes (Signed)
Pt presents with right hand pain that radiates to right arm and shoulder x 1 week.

## 2011-12-20 NOTE — ED Provider Notes (Signed)
Medical screening examination/treatment/procedure(s) were performed by non-physician practitioner and as supervising physician I was immediately available for consultation/collaboration.   Luman Holway M Matheu Ploeger, DO 12/20/11 0001 

## 2011-12-23 ENCOUNTER — Emergency Department (HOSPITAL_COMMUNITY)
Admission: EM | Admit: 2011-12-23 | Discharge: 2011-12-23 | Disposition: A | Discharge status: Home / Self Care | Payer: Medicaid Other | Attending: Emergency Medicine | Admitting: Emergency Medicine

## 2011-12-23 ENCOUNTER — Encounter (HOSPITAL_COMMUNITY): Payer: Self-pay | Admitting: Emergency Medicine

## 2011-12-23 DIAGNOSIS — F411 Generalized anxiety disorder: Secondary | ICD-10-CM | POA: Insufficient documentation

## 2011-12-23 DIAGNOSIS — M79609 Pain in unspecified limb: Secondary | ICD-10-CM | POA: Insufficient documentation

## 2011-12-23 DIAGNOSIS — Z85850 Personal history of malignant neoplasm of thyroid: Secondary | ICD-10-CM | POA: Insufficient documentation

## 2011-12-23 DIAGNOSIS — Z79899 Other long term (current) drug therapy: Secondary | ICD-10-CM | POA: Insufficient documentation

## 2011-12-23 DIAGNOSIS — M5412 Radiculopathy, cervical region: Secondary | ICD-10-CM | POA: Insufficient documentation

## 2011-12-23 DIAGNOSIS — M542 Cervicalgia: Secondary | ICD-10-CM | POA: Insufficient documentation

## 2011-12-23 MED ORDER — OXYCODONE-ACETAMINOPHEN 5-325 MG PO TABS: 1.0000 | ORAL_TABLET | Freq: Four times a day (QID) | ORAL | Status: AC | PRN

## 2011-12-23 MED ORDER — HYDROMORPHONE HCL PF 2 MG/ML IJ SOLN
2.0000 mg | Freq: Once | INTRAMUSCULAR | Status: AC
  Administered 2011-12-23: 2 mg via INTRAMUSCULAR
  Filled 2011-12-23: qty 1

## 2011-12-23 NOTE — ED Provider Notes (Signed)
History     CSN: [id number]  Arrival date & time 12/23/11  0117   First MD Initiated Contact with Patient 12/23/11 [phone]      Chief Complaint  Patient presents with  . Neck Pain    (Consider location/radiation/quality/duration/timing/severity/associated sxs/prior treatment) HPI This is a 31 year old black female who was seen 4 days ago for right carpal tunnel syndrome.she has also had left-sided posterior neck pain for 2 days but acutely worsened during sleep just prior to arrival. She had taken a Percocet prior to going to sleep. She describes the pain as a deep dull severe pain that radiates down to about her mid back. She denies any recent neck injury. It is not particularly changed with movement of the neck. She also complains of loss or pain from her knee down since yesterday evening. She states that this is. He related to the neck pain.  She has been newly established with Dr. Felecia Shelling and has her first appointment February 11.  Past Medical History  Diagnosis Date  . Thyroid cancer     Past Surgical History  Procedure Date  . Thyroidectomy, partial   . Cystectomy     No family history on file.  History  Substance Use Topics  . Smoking status: Never Smoker   . Smokeless tobacco: Not on file  . Alcohol Use: No     occasional    OB History    Grav Para Term Preterm Abortions TAB SAB Ect Mult Living                  Review of Systems  All other systems reviewed and are negative.    Allergies  Aspirin; Ibuprofen; Latex; Shellfish allergy; Lorcet; and Tape  Home Medications   Current Outpatient Rx  Name Route Sig Dispense Refill  . BENZONATATE 100 MG PO CAPS Oral Take 1 capsule (100 mg total) by mouth every 8 (eight) hours. 21 capsule 0  . ALKA-SELTZER PLUS COLD PO Oral Take 2 tablets by mouth as needed. Dissolved in water/juice for cold/allergy symptoms     . LEVOTHYROXINE SODIUM 88 MCG PO TABS Oral Take 88 mcg by mouth daily.      [name] MEDROXYPROGESTERONE  ACETATE 150 MG/ML IM SUSP Intramuscular Inject 150 mg into the muscle every 3 (three) months.      . NORETHINDRONE ACETATE 5 MG PO TABS Oral Take 5 mg by mouth daily.      [name] ONDANSETRON HCL 4 MG PO TABS Oral Take 1 tablet (4 mg total) by mouth every 6 (six) hours. 12 tablet 0  . OXYCODONE-ACETAMINOPHEN 5-325 MG PO TABS Oral Take 1 tablet by mouth every 6 (six) hours as needed for pain. 15 tablet 0  . PREDNISONE 10 MG PO TABS  6,5,4,3,2,1 - Take with food 21 tablet 0    BP 167/86  Pulse 110  Temp(Src) 97.6 F (36.4 C) (Oral)  Resp 20  Ht 5\' 6"  (1.676 m)  Wt 145 lb (65.772 kg)  BMI 23.40 kg/m2  SpO2 100%  Physical Exam General: Well-developed, well-nourished female in no acute distress; appearance consistent with age of record HENT: normocephalic, atraumatic Eyes: pupils equal round and reactive to light; extraocular muscles intact Neck: supple; no palpable muscle spasm; tenderness over left posterior paraspinal tissue at about the level of C6 Heart: regular rate and rhythm Lungs: normal respiratory effort and excursion Abdomen: soft; nondistended Extremities: No deformity; full range of motion except right hand and wrist comp not tested due to splinting  Neurologic: Awake, alert and oriented; motor function intact in all extremities and symmetric; no facial droop Skin: Warm and dry Psychiatric: tearful; anxious    ED Course  Procedures (including critical care time)    MDM  We'll check ANA and sedimentation rate which Dr. Felecia Shelling can follow up on.        Carlisle Beers Frances Joynt, MD 12/23/11 0200

## 2011-12-23 NOTE — ED Notes (Signed)
Patient c/o left sided upper back pain since Saturday and c/o right leg pain from the knee down to foot since last night.  States took a Percocet at 10pm and only slept for 30 minutes.

## 2011-12-24 LAB — ANA: Anti Nuclear Antibody(ANA): NEGATIVE

## 2012-01-03 ENCOUNTER — Encounter (HOSPITAL_COMMUNITY): Payer: Self-pay | Admitting: *Deleted

## 2012-01-03 ENCOUNTER — Emergency Department (HOSPITAL_COMMUNITY)
Admission: EM | Admit: 2012-01-03 | Discharge: 2012-01-04 | Disposition: A | Discharge status: Home / Self Care | Payer: Medicaid Other | Attending: Emergency Medicine | Admitting: Emergency Medicine

## 2012-01-03 ENCOUNTER — Emergency Department (HOSPITAL_COMMUNITY): Payer: Medicaid Other

## 2012-01-03 DIAGNOSIS — Z79899 Other long term (current) drug therapy: Secondary | ICD-10-CM | POA: Insufficient documentation

## 2012-01-03 DIAGNOSIS — R202 Paresthesia of skin: Secondary | ICD-10-CM

## 2012-01-03 DIAGNOSIS — R209 Unspecified disturbances of skin sensation: Secondary | ICD-10-CM | POA: Insufficient documentation

## 2012-01-03 DIAGNOSIS — F4310 Post-traumatic stress disorder, unspecified: Secondary | ICD-10-CM | POA: Insufficient documentation

## 2012-01-03 DIAGNOSIS — Z85850 Personal history of malignant neoplasm of thyroid: Secondary | ICD-10-CM | POA: Insufficient documentation

## 2012-01-03 HISTORY — DX: Post-traumatic stress disorder, unspecified: F43.10

## 2012-01-03 MED ORDER — LORAZEPAM 1 MG PO TABS
1.0000 mg | ORAL_TABLET | Freq: Once | ORAL | Status: AC
  Administered 2012-01-04: 1 mg via ORAL
  Filled 2012-01-03: qty 1

## 2012-01-03 NOTE — ED Notes (Signed)
Pt states she is having numbness in her right foot & left hand. Pain described as pin & needles.
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
## 2012-01-04 NOTE — ED Notes (Signed)
Pt alert & oriented x4, stable gait. Pt given discharge instructions, paperwork, pt verbalized understanding. Pt left department w/ no further questions.  

## 2012-01-06 NOTE — ED Provider Notes (Signed)
Medical screening examination/treatment/procedure(s) were performed by non-physician practitioner and as supervising physician I was immediately available for consultation/collaboration.   Jamice Carreno, MD 01/06/12 0840 

## 2012-01-28 ENCOUNTER — Emergency Department (HOSPITAL_COMMUNITY)
Admission: EM | Admit: 2012-01-28 | Discharge: 2012-01-28 | Disposition: A | Discharge status: Home / Self Care | Payer: Medicaid Other | Attending: Emergency Medicine | Admitting: Emergency Medicine

## 2012-01-28 ENCOUNTER — Encounter (HOSPITAL_COMMUNITY): Payer: Self-pay | Admitting: *Deleted

## 2012-01-28 DIAGNOSIS — R109 Unspecified abdominal pain: Secondary | ICD-10-CM | POA: Insufficient documentation

## 2012-01-28 DIAGNOSIS — B9689 Other specified bacterial agents as the cause of diseases classified elsewhere: Secondary | ICD-10-CM | POA: Insufficient documentation

## 2012-01-28 DIAGNOSIS — N760 Acute vaginitis: Secondary | ICD-10-CM | POA: Insufficient documentation

## 2012-01-28 DIAGNOSIS — A499 Bacterial infection, unspecified: Secondary | ICD-10-CM | POA: Insufficient documentation

## 2012-01-28 LAB — URINALYSIS, ROUTINE W REFLEX MICROSCOPIC
Bilirubin Urine: NEGATIVE
Ketones, ur: NEGATIVE mg/dL
Nitrite: NEGATIVE
Urobilinogen, UA: 0.2 mg/dL (ref 0.0–1.0)
pH: 6 (ref 5.0–8.0)

## 2012-01-28 LAB — WET PREP, GENITAL: Yeast Wet Prep HPF POC: NONE SEEN

## 2012-01-28 LAB — URINE MICROSCOPIC-ADD ON

## 2012-01-28 NOTE — ED Notes (Signed)
Lower abdominal pain and light green discharge x 3 days.
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
## 2012-01-28 NOTE — ED Notes (Signed)
Pt left without receiving discharge papers or signing e-signature; however, discharge information was given to pt by MD resident on file. Pt was stable at time of departure

## 2012-01-28 NOTE — Discharge Instructions (Signed)
Please follow up with your gynecologist to ask about need for treatment if symptoms do not resolve.

## 2012-01-29 ENCOUNTER — Other Ambulatory Visit (HOSPITAL_COMMUNITY): Payer: Self-pay | Admitting: "Endocrinology

## 2012-01-29 DIAGNOSIS — C73 Malignant neoplasm of thyroid gland: Secondary | ICD-10-CM

## 2012-01-29 NOTE — ED Provider Notes (Signed)
I saw and evaluated the patient, reviewed the resident's note and I agree with the findings and plan.   .Face to face Exam:  General:  Awake HEENT:  Atraumatic Resp:  Normal effort Abd:  Nondistended Neuro:No focal weakness Lymph: No adenopathy   Nelia Shi, MD 01/29/12 2308

## 2012-01-29 NOTE — ED Provider Notes (Signed)
Medical screening examination/treatment/procedure(s) were performed by non-physician practitioner and as supervising physician I was immediately available for consultation/collaboration. Devoria Albe, MD, FACEP   Ward Givens, MD 01/29/12 1022

## 2012-02-02 ENCOUNTER — Ambulatory Visit (HOSPITAL_COMMUNITY): Payer: Medicaid Other

## 2012-02-03 ENCOUNTER — Ambulatory Visit (HOSPITAL_COMMUNITY): Payer: Medicaid Other

## 2012-02-04 ENCOUNTER — Encounter (HOSPITAL_COMMUNITY)
Admission: RE | Admit: 2012-02-04 | Discharge: 2012-02-04 | Disposition: A | Discharge status: Home / Self Care | Payer: Medicaid Other | Source: Ambulatory Visit | Attending: "Endocrinology | Admitting: "Endocrinology

## 2012-02-04 ENCOUNTER — Ambulatory Visit (HOSPITAL_COMMUNITY): Payer: Medicaid Other

## 2012-02-04 DIAGNOSIS — C73 Malignant neoplasm of thyroid gland: Secondary | ICD-10-CM | POA: Insufficient documentation

## 2012-02-05 ENCOUNTER — Encounter (HOSPITAL_COMMUNITY)
Admission: RE | Admit: 2012-02-05 | Discharge: 2012-02-05 | Disposition: A | Discharge status: Home / Self Care | Payer: Medicaid Other | Source: Ambulatory Visit | Attending: "Endocrinology | Admitting: "Endocrinology

## 2012-02-05 NOTE — Progress Notes (Signed)
Thyrogen 0.9mg /1.35mL injected in LUOQ buttock with 21g needle. Bandaid applied. Tolerated well

## 2012-02-05 NOTE — Progress Notes (Signed)
Thyrogen 0.9mg /1.100mL injected in RUOQ buttock with 21g needle. Bandaid applied. Tolerated well

## 2012-02-06 ENCOUNTER — Encounter (HOSPITAL_COMMUNITY): Payer: Medicaid Other

## 2012-02-06 ENCOUNTER — Encounter (HOSPITAL_COMMUNITY)
Admission: RE | Admit: 2012-02-06 | Discharge: 2012-02-06 | Disposition: A | Discharge status: Home / Self Care | Payer: Medicaid Other | Source: Ambulatory Visit | Attending: "Endocrinology | Admitting: "Endocrinology

## 2012-02-06 DIAGNOSIS — Z3202 Encounter for pregnancy test, result negative: Secondary | ICD-10-CM | POA: Insufficient documentation

## 2012-02-06 LAB — HCG, SERUM, QUALITATIVE: Preg, Serum: NEGATIVE

## 2012-02-06 MED ORDER — SODIUM IODIDE I 131 CAPSULE: 3.0000 | Freq: Once | INTRAVENOUS | Status: AC | PRN

## 2012-02-09 ENCOUNTER — Encounter (HOSPITAL_COMMUNITY)
Admission: RE | Admit: 2012-02-09 | Discharge: 2012-02-09 | Disposition: A | Discharge status: Home / Self Care | Payer: Medicaid Other | Source: Ambulatory Visit | Attending: Orthopaedic Surgery | Admitting: Orthopaedic Surgery

## 2012-02-09 ENCOUNTER — Encounter (HOSPITAL_COMMUNITY)
Admission: RE | Admit: 2012-02-09 | Discharge: 2012-02-09 | Disposition: A | Discharge status: Home / Self Care | Payer: Medicaid Other | Source: Ambulatory Visit | Attending: "Endocrinology | Admitting: "Endocrinology

## 2012-02-09 ENCOUNTER — Encounter (HOSPITAL_COMMUNITY): Payer: Self-pay | Admitting: Pharmacy Technician

## 2012-02-09 ENCOUNTER — Encounter (HOSPITAL_COMMUNITY): Payer: Self-pay

## 2012-02-09 LAB — URINE MICROSCOPIC-ADD ON

## 2012-02-09 LAB — CBC
Hemoglobin: 12.8 g/dL (ref 12.0–15.0)
MCH: 27.7 pg (ref 26.0–34.0)
Platelets: 250 10*3/uL (ref 150–400)
RBC: 4.62 MIL/uL (ref 3.87–5.11)
WBC: 4.9 10*3/uL (ref 4.0–10.5)

## 2012-02-09 LAB — URINALYSIS, ROUTINE W REFLEX MICROSCOPIC
Glucose, UA: NEGATIVE mg/dL
Protein, ur: NEGATIVE mg/dL
Specific Gravity, Urine: 1.01 (ref 1.005–1.030)

## 2012-02-09 LAB — COMPREHENSIVE METABOLIC PANEL
ALT: 22 U/L (ref 0–35)
AST: 17 U/L (ref 0–37)
Albumin: 3.9 g/dL (ref 3.5–5.2)
Chloride: 105 mEq/L (ref 96–112)
Creatinine, Ser: 1.03 mg/dL (ref 0.50–1.10)
Sodium: 137 mEq/L (ref 135–145)
Total Bilirubin: 0.3 mg/dL (ref 0.3–1.2)

## 2012-02-09 LAB — DIFFERENTIAL
Basophils Absolute: 0 10*3/uL (ref 0.0–0.1)
Basophils Relative: 0 % (ref 0–1)
Monocytes Absolute: 0.4 10*3/uL (ref 0.1–1.0)
Neutro Abs: 2.6 10*3/uL (ref 1.7–7.7)
Neutrophils Relative %: 52 % (ref 43–77)

## 2012-02-09 LAB — SURGICAL PCR SCREEN
MRSA, PCR: NEGATIVE
Staphylococcus aureus: NEGATIVE

## 2012-02-09 NOTE — Patient Instructions (Addendum)
19 Kristina Li  02/09/2012   Your procedure is scheduled on:  3.5.13  Report to [facility] at 0750 AM.  Call this number if you have problems the morning of surgery: [phone]   Remember:   Do not eat food:After Midnight.  May have clear liquids:until Midnight .  Clear liquids include soda, tea, black coffee, apple or grape juice, broth.  Take these medicines the morning of surgery with A SIP OF WATER: synthroid   Do not wear jewelry, make-up or nail polish.  Do not wear lotions, powders, or perfumes. You may wear deodorant.  Do not shave 48 hours prior to surgery.  Do not bring valuables to the hospital.  Contacts, dentures or bridgework may not be worn into surgery.  Leave suitcase in the car. After surgery it may be brought to your room.  For patients admitted to the hospital, checkout time is 11:00 AM the day of discharge.   Patients discharged the day of surgery will not be allowed to drive home.  Name and phone number of your driver: family  Special Instructions: CHG Shower Use Special Wash: 1/2 bottle night before surgery and 1/2 bottle morning of surgery.   Please read over the following fact sheets that you were given: Pain Booklet, MRSA Information, Surgical Site Infection Prevention, Anesthesia Post-op Instructions and Care and Recovery After Surgery    PATIENT INSTRUCTIONS POST-ANESTHESIA  IMMEDIATELY FOLLOWING SURGERY:  Do not drive or operate machinery for the first twenty four hours after surgery.  Do not make any important decisions for twenty four hours after surgery or while taking narcotic pain medications or sedatives.  If you develop intractable nausea and vomiting or a severe headache please notify your doctor immediately.  FOLLOW-UP:  Please make an appointment with your surgeon as instructed. You do not need to follow up with anesthesia unless specifically instructed to do so.  WOUND CARE INSTRUCTIONS (if applicable):  Keep a dry clean dressing on  the anesthesia/puncture wound site if there is drainage.  Once the wound has quit draining you may leave it open to air.  Generally you should leave the bandage intact for twenty four hours unless there is drainage.  If the epidural site drains for more than 36-48 hours please call the anesthesia department.  QUESTIONS?:  Please feel free to call your physician or the hospital operator if you have any questions, and they will be happy to assist you.     [facility] Anesthesia Department [address] [place] [phone]    Carpal Tunnel Release Carpal tunnel release is done to relieve the pressure on the nerves and tendons on the bottom side of your wrist.  LET YOUR CAREGIVER KNOW ABOUT:   Allergies to food or medicine.   Medicines taken, including vitamins, herbs, eyedrops, over-the-counter medicines, and creams.   Use of steroids (by mouth or creams).   Previous problems with anesthetics or numbing medicines.   History of bleeding problems or blood clots.   Previous surgery.   Other health problems, including diabetes and kidney problems.   Possibility of pregnancy, if this applies.  RISKS AND COMPLICATIONS  Some problems that may happen after this procedure include:  Infection.   Damage to the nerves, arteries or tendons could occur. This would be very uncommon.   Bleeding.  BEFORE THE PROCEDURE   This surgery may be done while you are asleep (general anesthetic) or may be done under a block where only your forearm and the  surgical area is numb.   If the surgery is done under a block, the numbness will gradually wear off within several hours after surgery.  HOME CARE INSTRUCTIONS   Have a responsible person with you for 24 hours.   Do not drive a car or use public transportation for 24 hours.   Only take over-the-counter or prescription medicines for pain, discomfort, or fever as directed by your caregiver. Take them as directed.   You may put ice on  the palm side of the affected wrist.   Put ice in a plastic bag.   Place a towel between your skin and the bag.   Leave the ice on for 20 to 30 minutes, 4 times per day.   If you were given a splint to keep your wrist from bending, use it as directed. It is important to wear the splint at night or as directed. Use the splint for as long as you have pain or numbness in your hand, arm, or wrist. This may take 1 to 2 months.   Keep your hand raised (elevated) above the level of your heart as much as possible. This keeps swelling down and helps with discomfort.   Change bandages (dressings) as directed.   Keep the wound clean and dry.  SEEK MEDICAL CARE IF:   You develop pain not relieved with medications.   You develop numbness of your hand.   You develop bleeding from your surgical site.   You have an oral temperature above 102 F (38.9 C).   You develop redness or swelling of the surgical site.   You develop new, unexplained problems.  SEEK IMMEDIATE MEDICAL CARE IF:   You develop a rash.   You have difficulty breathing.   You develop any reaction or side effects to medications given.  Document Released: 02/14/2004 Document Revised: 11/13/2011 Document Reviewed: 09/30/2007 [facility] Patient Information 2012 [place], [place].
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
## 2012-02-10 ENCOUNTER — Ambulatory Visit (HOSPITAL_COMMUNITY): Payer: Medicaid Other | Admitting: Anesthesiology

## 2012-02-10 ENCOUNTER — Encounter (HOSPITAL_COMMUNITY): Payer: Self-pay | Admitting: Anesthesiology

## 2012-02-10 ENCOUNTER — Ambulatory Visit (HOSPITAL_COMMUNITY)
Admission: RE | Admit: 2012-02-10 | Discharge: 2012-02-10 | Disposition: A | Discharge status: Home / Self Care | Payer: Medicaid Other | Source: Ambulatory Visit | Attending: Orthopaedic Surgery | Admitting: Orthopaedic Surgery

## 2012-02-10 ENCOUNTER — Encounter (HOSPITAL_COMMUNITY)
Admission: RE | Disposition: A | Discharge status: Home / Self Care | Payer: Self-pay | Source: Ambulatory Visit | Attending: Orthopaedic Surgery

## 2012-02-10 ENCOUNTER — Encounter (HOSPITAL_COMMUNITY): Payer: Self-pay | Admitting: *Deleted

## 2012-02-10 DIAGNOSIS — Z01812 Encounter for preprocedural laboratory examination: Secondary | ICD-10-CM | POA: Insufficient documentation

## 2012-02-10 DIAGNOSIS — G5600 Carpal tunnel syndrome, unspecified upper limb: Secondary | ICD-10-CM | POA: Insufficient documentation

## 2012-02-10 HISTORY — PX: CARPAL TUNNEL RELEASE: SHX101

## 2012-02-10 SURGERY — CARPAL TUNNEL RELEASE
Anesthesia: Regional | Site: Wrist | Laterality: Right | Wound class: Clean

## 2012-02-10 MED ORDER — FENTANYL CITRATE 0.05 MG/ML IJ SOLN
25.0000 ug | INTRAMUSCULAR | Status: DC | PRN
  Administered 2012-02-10 (×2): 50 ug via INTRAVENOUS

## 2012-02-10 MED ORDER — LIDOCAINE HCL (PF) 0.5 % IJ SOLN
INTRAMUSCULAR | Status: DC | PRN
  Administered 2012-02-10: 250 mg via INTRATHECAL

## 2012-02-10 MED ORDER — MIDAZOLAM HCL 2 MG/2ML IJ SOLN
INTRAMUSCULAR | Status: AC
  Filled 2012-02-10: qty 2

## 2012-02-10 MED ORDER — SODIUM CHLORIDE 0.9 % IJ SOLN
INTRAMUSCULAR | Status: AC
  Filled 2012-02-10: qty 10

## 2012-02-10 MED ORDER — LACTATED RINGERS IV SOLN
INTRAVENOUS | Status: DC
  Administered 2012-02-10: 09:00:00 via INTRAVENOUS

## 2012-02-10 MED ORDER — MIDAZOLAM HCL 5 MG/5ML IJ SOLN
INTRAMUSCULAR | Status: DC | PRN
  Administered 2012-02-10: 2 mg via INTRAVENOUS
  Administered 2012-02-10 (×2): 1 mg via INTRAVENOUS

## 2012-02-10 MED ORDER — PROPOFOL 10 MG/ML IV EMUL
INTRAVENOUS | Status: AC
  Filled 2012-02-10: qty 20

## 2012-02-10 MED ORDER — ONDANSETRON HCL 4 MG/2ML IJ SOLN: 4.0000 mg | Freq: Once | INTRAMUSCULAR | Status: DC | PRN

## 2012-02-10 MED ORDER — MIDAZOLAM HCL 2 MG/2ML IJ SOLN
1.0000 mg | INTRAMUSCULAR | Status: DC | PRN
  Administered 2012-02-10: 2 mg via INTRAVENOUS

## 2012-02-10 MED ORDER — SODIUM CHLORIDE 0.9 % IR SOLN
Status: DC | PRN
  Administered 2012-02-10: 1000 mL

## 2012-02-10 MED ORDER — FENTANYL CITRATE 0.05 MG/ML IJ SOLN
INTRAMUSCULAR | Status: DC | PRN
  Administered 2012-02-10 (×4): 25 ug via INTRAVENOUS

## 2012-02-10 MED ORDER — SODIUM CHLORIDE 0.9 % IJ SOLN
INTRAMUSCULAR | Status: DC | PRN
  Administered 2012-02-10: 2 mL via INTRAVENOUS

## 2012-02-10 MED ORDER — LIDOCAINE HCL (PF) 0.5 % IJ SOLN
INTRAMUSCULAR | Status: AC
  Filled 2012-02-10: qty 50

## 2012-02-10 MED ORDER — LIDOCAINE HCL 1 % IJ SOLN
INTRAMUSCULAR | Status: DC | PRN
  Administered 2012-02-10: 25 mg via INTRADERMAL

## 2012-02-10 MED ORDER — PROPOFOL 10 MG/ML IV EMUL
INTRAVENOUS | Status: DC | PRN
  Administered 2012-02-10: 50 ug/kg/min via INTRAVENOUS

## 2012-02-10 MED ORDER — MIDAZOLAM HCL 2 MG/2ML IJ SOLN
INTRAMUSCULAR | Status: AC
  Filled 2012-02-10: qty 4

## 2012-02-10 MED ORDER — FENTANYL CITRATE 0.05 MG/ML IJ SOLN
INTRAMUSCULAR | Status: AC
  Administered 2012-02-10: 50 ug via INTRAVENOUS
  Filled 2012-02-10: qty 2

## 2012-02-10 MED ORDER — FENTANYL CITRATE 0.05 MG/ML IJ SOLN
INTRAMUSCULAR | Status: AC
  Filled 2012-02-10: qty 2

## 2012-02-10 SURGICAL SUPPLY — 43 items
BAG HAMPER (MISCELLANEOUS) ×2 IMPLANT
BANDAGE ELASTIC 3 VELCRO NS (GAUZE/BANDAGES/DRESSINGS) ×2 IMPLANT
BANDAGE ESMARK 4X12 BL STRL LF (DISPOSABLE) ×1 IMPLANT
BLADE SURG 15 STRL LF DISP TIS (BLADE) ×1 IMPLANT
BLADE SURG 15 STRL SS (BLADE) ×4
BNDG CMPR 12X4 ELC STRL LF (DISPOSABLE) ×1
BNDG ESMARK 4X12 BLUE STRL LF (DISPOSABLE) ×2
CLOTH BEACON ORANGE TIMEOUT ST (SAFETY) ×2 IMPLANT
COVER LIGHT HANDLE STERIS (MISCELLANEOUS) ×4 IMPLANT
CUFF TOURNIQUET SINGLE 18IN (TOURNIQUET CUFF) ×2 IMPLANT
DRAPE EXTREMITY T 121X128X90 (DRAPE) ×1 IMPLANT
DRSG XEROFORM 1X8 (GAUZE/BANDAGES/DRESSINGS) ×1 IMPLANT
ELECT NEEDLE TIP 2.8 STRL (NEEDLE) IMPLANT
ELECT REM PT RETURN 9FT ADLT (ELECTROSURGICAL) ×2
ELECTRODE REM PT RTRN 9FT ADLT (ELECTROSURGICAL) ×1 IMPLANT
FORMALIN 10 PREFIL 120ML (MISCELLANEOUS) ×2 IMPLANT
GLOVE BIO SURGEON STRL SZ8 (GLOVE) ×2 IMPLANT
GLOVE BIO SURGEON STRL SZ8.5 (GLOVE) ×1 IMPLANT
GLOVE BIOGEL PI IND STRL 7.0 (GLOVE) IMPLANT
GLOVE BIOGEL PI INDICATOR 7.0 (GLOVE) ×2
GLOVE EXAM NITRILE MD LF STRL (GLOVE) ×1 IMPLANT
GLOVE SKINSENSE NS SZ6.5 (GLOVE) ×1
GLOVE SKINSENSE NS SZ8.0 LF (GLOVE) ×1
GLOVE SKINSENSE STRL SZ6.5 (GLOVE) IMPLANT
GLOVE SKINSENSE STRL SZ8.0 LF (GLOVE) ×1 IMPLANT
GOWN STRL REIN XL XLG (GOWN DISPOSABLE) ×4 IMPLANT
KIT ROOM TURNOVER APOR (KITS) ×2 IMPLANT
NDL HYPO 18GX1.5 BLUNT FILL (NEEDLE) ×1 IMPLANT
NDL HYPO 27GX1-1/4 (NEEDLE) ×1 IMPLANT
NEEDLE HYPO 18GX1.5 BLUNT FILL (NEEDLE) ×2 IMPLANT
NEEDLE HYPO 27GX1-1/4 (NEEDLE) ×2 IMPLANT
NS IRRIG 1000ML POUR BTL (IV SOLUTION) ×2 IMPLANT
PACK PERI GYN (CUSTOM PROCEDURE TRAY) ×1 IMPLANT
PAD ARMBOARD 7.5X6 YLW CONV (MISCELLANEOUS) ×2 IMPLANT
PAD CAST 3X4 CTTN HI CHSV (CAST SUPPLIES) ×1 IMPLANT
PADDING CAST COTTON 3X4 STRL (CAST SUPPLIES) ×2
SET BASIN LINEN APH (SET/KITS/TRAYS/PACK) ×2 IMPLANT
SOL PREP PROV IODINE SCRUB 4OZ (MISCELLANEOUS) ×1 IMPLANT
SPONGE GAUZE 4X4 12PLY (GAUZE/BANDAGES/DRESSINGS) ×2 IMPLANT
SUT ETHILON 3 0 FSL (SUTURE) ×2 IMPLANT
SYR 3ML LL SCALE MARK (SYRINGE) ×2 IMPLANT
TOWEL OR 17X26 4PK STRL BLUE (TOWEL DISPOSABLE) ×2 IMPLANT
VESSEL LOOPS MAXI RED (MISCELLANEOUS) ×2 IMPLANT

## 2012-02-10 NOTE — Progress Notes (Signed)
The History and Physical is unchanged. I have examined the patient. The patient is medically able to have surgery on the right hand . Kristina Li  

## 2012-02-10 NOTE — Anesthesia Postprocedure Evaluation (Signed)
  Anesthesia Post-op Note  Patient: Kristina Li  Procedure(s) Performed: Procedure(s) (LRB): CARPAL TUNNEL RELEASE (Right)  Patient Location: PACU  Anesthesia Type: Bier block  Level of Consciousness: awake, alert , oriented and patient cooperative  Airway and Oxygen Therapy: Patient Spontanous Breathing  Post-op Pain: 3 /10, mild  Post-op Assessment: Post-op Vital signs reviewed, Patient's Cardiovascular Status Stable, Respiratory Function Stable, Patent Airway and No signs of Nausea or vomiting  Post-op Vital Signs: Reviewed and stable  Complications: No apparent anesthesia complications

## 2012-02-10 NOTE — Transfer of Care (Signed)
Immediate Anesthesia Transfer of Care Note  Patient: Kristina Li  Procedure(s) Performed: Procedure(s) (LRB): CARPAL TUNNEL RELEASE (Right)  Patient Location: PACU  Anesthesia Type: Bier block  Level of Consciousness: awake and patient cooperative  Airway & Oxygen Therapy: Patient Spontanous Breathing and Patient connected to face mask oxygen  Post-op Assessment: Report given to PACU RN, Post -op Vital signs reviewed and stable and Patient moving all extremities  Post vital signs: Reviewed and stable  Complications: No apparent anesthesia complications

## 2012-02-10 NOTE — Discharge Instructions (Signed)
Keep hand dry.  Keep appointment next week to see Dr. Hilda Lias.  Elevate hand.  Move fingers often.  Call if any problem, Dr. Sanjuan Dame office [phone] or hospital [phone].

## 2012-02-10 NOTE — Anesthesia Procedure Notes (Signed)
Anesthesia Regional Block:  Bier block (IV Regional)  Pre-Anesthetic Checklist: ,,, Correct Patient, Correct Site, Correct Laterality, Correct Procedure, Correct Position, site marked, risks and benefits discussed, Surgical consent, Pre-op evaluation  Laterality: Right  Prep: chloraprep       Needles:  Injection technique: Single-shot      Additional Needles: Bier block (IV Regional) Narrative:  Start time: 02/10/2012 9:09 AM Resident/CRNA: Jiles Harold

## 2012-02-10 NOTE — Anesthesia Preprocedure Evaluation (Addendum)
Anesthesia Evaluation  Patient identified by MRN, date of birth, ID band Patient awake    Reviewed: Allergy & Precautions, H&P , NPO status , Patient's Chart, lab work & pertinent test results  Airway Mallampati: I      Dental  (+) Teeth Intact   Pulmonary shortness of breath,  breath sounds clear to auscultation        Cardiovascular negative cardio ROS  Rhythm:Regular Rate:Normal     Neuro/Psych PSYCHIATRIC DISORDERS (PTSD)    GI/Hepatic   Endo/Other  Hyperthyroidism (Thyroid CA, radiation Rx)   Renal/GU      Musculoskeletal   Abdominal   Peds  Hematology   Anesthesia Other Findings   Reproductive/Obstetrics                           Anesthesia Physical Anesthesia Plan  ASA: II  Anesthesia Plan: Bier Block   Post-op Pain Management:    Induction: Intravenous  Airway Management Planned: Nasal Cannula  Additional Equipment:   Intra-op Plan:   Post-operative Plan:   Informed Consent: I have reviewed the patients History and Physical, chart, labs and discussed the procedure including the risks, benefits and alternatives for the proposed anesthesia with the patient or authorized representative who has indicated his/her understanding and acceptance.     Plan Discussed with: CRNA  Anesthesia Plan Comments:        Anesthesia Quick Evaluation

## 2012-02-10 NOTE — Brief Op Note (Signed)
02/10/2012  9:36 AM  PATIENT:  Kristina Li  31 y.o. female  PRE-OPERATIVE DIAGNOSIS:  carpal tunnel syndrome right wrist  POST-OPERATIVE DIAGNOSIS:  carpal tunnel syndrome right wrist  PROCEDURE:  Procedure(s) (LRB): CARPAL TUNNEL RELEASE (Right)  SURGEON:  Surgeon(s) and Role:    * Darreld Mclean, MD - Primary  PHYSICIAN ASSISTANT:   ASSISTANTS: none   ANESTHESIA:   regional  EBL:  Total I/O In: 200 [I.V.:200] Out: -   BLOOD ADMINISTERED:none  DRAINS: none   LOCAL MEDICATIONS USED:  NONE  SPECIMEN:  Source of Specimen:  volar carpal ligament  DISPOSITION OF SPECIMEN:  PATHOLOGY  COUNTS:  YES  TOURNIQUET:  * Missing tourniquet times found for documented tourniquets in log:  27177 *  DICTATION: .Other Dictation: Dictation Number [id number]  PLAN OF CARE: Discharge to home after PACU  PATIENT DISPOSITION:  PACU - hemodynamically stable.   Delay start of Pharmacological VTE agent (>24hrs) due to surgical blood loss or risk of bleeding: not applicable

## 2012-02-11 NOTE — Op Note (Signed)
NAME:  Kristina Li, Kristina Li NO.:  [id number]  MEDICAL RECORD NO.:  [id number]  LOCATION:  [facility]                          FACILITY:  [facility]  PHYSICIAN:  J. Darreld Mclean, M.D. DATE OF BIRTH:  1980/12/25  DATE OF PROCEDURE: DATE OF DISCHARGE:  02/10/2012                              OPERATIVE REPORT   PREOPERATIVE DIAGNOSIS:  Carpal tunnel syndrome, right.  POSTOPERATIVE DIAGNOSIS:  Carpal tunnel syndrome, right.  PROCEDURE:  Release of volar carpal ligament, saline neurolysis, epineurotomy, right median nerve.  ANESTHESIA:  Bier block.  Please refer anesthesia record for tourniquet time.  SURGEON:  J. Darreld Mclean, MD  INDICATIONS:  The patient has had pain and tenderness in both hands particularly on the right with numbness in the median nerve distribution.  It has not been helped with wrist splinting and anti- inflammatories.  She has been followed by Dr. Felecia Shelling.  Dr. Felecia Shelling had her to see Dr. Gerilyn Pilgrim for EMGs.  EMG showed bilateral carpal tunnel syndrome.  Base of the right hand hurts the most, likely the right hand done first.  I went over the risks and imponderables of the procedure. She appears to understand and agreed to procedure as outlined.  She understands the left hand will need to be done at a later time.  DESCRIPTION OF PROCEDURE:  The patient was seen in the holding area. The right hand was identified as correct surgical site.  I placed a mark on the right wrist on the volar side.  The patient was brought to the operating room and placed supine.  She was given Bier block anesthesia. She was prepped and draped in usual manner.  We had a generalized time-out identifying the patient as [name], we are doing her right hand for carpal tunnel surgery.  OR team knew each other.  All instrumentation was in good position and working properly.  Lateral incision was made on the volar side of the wrist with careful dissection.  The median nerve was  identified proximally.  A vessel loop was placed around the nerve.  Then a Engineer, drilling was then inserted and lined with the ring finger.  The volar carpal ligament was then incised.  Specimen of volar carpal ligament was sent to pathology.  The nerve was obviously compressed.  Saline neurolysis, epineurotomy carried out.  Retinaculum was cut proximally.  The wound was then reapproximated to removing the vessel loop first.  A 3-0 nylon was used in a simple manner and in interrupted manner.  Sterile dressing applied.  Sheet cotton applied.  Sheet cotton cut dorsally. ACE bandage applied loosely.  The patient tolerated the procedure well. She will go to recovery in good condition.  I will see her in the office next week.  Toradol will be given for pain.  If any difficulties, the patient should contact me through the office hospital beeper system.          ______________________________ Shela Commons. Darreld Mclean, M.D.     JWK/MEDQ  D:  02/10/2012  T:  02/11/2012  Job:  [id number]

## 2012-02-13 ENCOUNTER — Encounter (HOSPITAL_COMMUNITY): Payer: Self-pay | Admitting: Orthopaedic Surgery

## 2012-02-18 ENCOUNTER — Encounter (HOSPITAL_COMMUNITY): Payer: Self-pay | Admitting: *Deleted

## 2012-02-18 ENCOUNTER — Emergency Department (HOSPITAL_COMMUNITY)
Admission: EM | Admit: 2012-02-18 | Discharge: 2012-02-18 | Disposition: A | Discharge status: Home / Self Care | Payer: Medicaid Other | Attending: Emergency Medicine | Admitting: Emergency Medicine

## 2012-02-18 DIAGNOSIS — R5381 Other malaise: Secondary | ICD-10-CM | POA: Insufficient documentation

## 2012-02-18 DIAGNOSIS — N939 Abnormal uterine and vaginal bleeding, unspecified: Secondary | ICD-10-CM

## 2012-02-18 DIAGNOSIS — R109 Unspecified abdominal pain: Secondary | ICD-10-CM | POA: Insufficient documentation

## 2012-02-18 DIAGNOSIS — N898 Other specified noninflammatory disorders of vagina: Secondary | ICD-10-CM | POA: Insufficient documentation

## 2012-02-18 DIAGNOSIS — Z85850 Personal history of malignant neoplasm of thyroid: Secondary | ICD-10-CM | POA: Insufficient documentation

## 2012-02-18 DIAGNOSIS — Z79899 Other long term (current) drug therapy: Secondary | ICD-10-CM | POA: Insufficient documentation

## 2012-02-18 LAB — DIFFERENTIAL
Basophils Relative: 0 % (ref 0–1)
Eosinophils Absolute: 0.1 10*3/uL (ref 0.0–0.7)
Neutrophils Relative %: 47 % (ref 43–77)

## 2012-02-18 LAB — CBC
MCH: 27.5 pg (ref 26.0–34.0)
MCHC: 33.2 g/dL (ref 30.0–36.0)
Platelets: 256 10*3/uL (ref 150–400)
RBC: 4.73 MIL/uL (ref 3.87–5.11)

## 2012-02-18 LAB — BASIC METABOLIC PANEL
GFR calc Af Amer: >90 mL/min (ref >90)
GFR calc non Af Amer: 82 mL/min — ABNORMAL LOW (ref >90)
Potassium: 3.8 mEq/L (ref 3.5–5.1)
Sodium: 136 mEq/L (ref 135–145)

## 2012-02-18 LAB — PREGNANCY, URINE: Preg Test, Ur: NEGATIVE

## 2012-02-18 NOTE — ED Notes (Signed)
Pt c/o vaginal bleeding x 2 weeks. States that she was seen by her MD and put on medication. States that the bleeding and cramping have not stopped. Pt called her MD office today and could not see her MD till Friday. Per patient another MD at the office told her to come to ED. Pt states that she has endometriosis. Alert and oriented x 3. Skin warm and dry. Color pink.

## 2012-02-18 NOTE — ED Notes (Signed)
Vaginal bleeding for 2 weeks.  Seen by [facility] and given medication. Has not stopped the bleeding.  Has bandage on rt hand s/p surgery.
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
## 2012-02-18 NOTE — Discharge Instructions (Signed)
Stop the megace and follow up with dr. Audrea Muscat

## 2012-03-04 ENCOUNTER — Emergency Department (HOSPITAL_COMMUNITY)
Admission: EM | Admit: 2012-03-04 | Discharge: 2012-03-05 | Disposition: A | Discharge status: Home / Self Care | Payer: Medicaid Other | Attending: Emergency Medicine | Admitting: Emergency Medicine

## 2012-03-04 ENCOUNTER — Other Ambulatory Visit: Payer: Self-pay

## 2012-03-04 ENCOUNTER — Encounter (HOSPITAL_COMMUNITY): Payer: Self-pay | Admitting: *Deleted

## 2012-03-04 DIAGNOSIS — Z886 Allergy status to analgesic agent status: Secondary | ICD-10-CM | POA: Insufficient documentation

## 2012-03-04 DIAGNOSIS — R0789 Other chest pain: Secondary | ICD-10-CM | POA: Insufficient documentation

## 2012-03-04 DIAGNOSIS — F411 Generalized anxiety disorder: Secondary | ICD-10-CM | POA: Insufficient documentation

## 2012-03-04 DIAGNOSIS — C73 Malignant neoplasm of thyroid gland: Secondary | ICD-10-CM | POA: Insufficient documentation

## 2012-03-04 DIAGNOSIS — F419 Anxiety disorder, unspecified: Secondary | ICD-10-CM

## 2012-03-04 DIAGNOSIS — Z91013 Allergy to seafood: Secondary | ICD-10-CM | POA: Insufficient documentation

## 2012-03-04 DIAGNOSIS — Z9089 Acquired absence of other organs: Secondary | ICD-10-CM | POA: Insufficient documentation

## 2012-03-04 MED ORDER — ALPRAZOLAM 0.5 MG PO TABS
0.2500 mg | ORAL_TABLET | Freq: Once | ORAL | Status: AC
  Administered 2012-03-05: 0.5 mg via ORAL
  Filled 2012-03-04: qty 1

## 2012-03-04 NOTE — ED Notes (Signed)
Anxiety 'getting worse". I need to talk with someone.

## 2012-03-05 MED ORDER — ALPRAZOLAM 0.25 MG PO TABS: 0.2500 mg | ORAL_TABLET | Freq: Every evening | ORAL | Status: AC | PRN
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
## 2012-03-05 NOTE — Discharge Instructions (Signed)
Follow up with your psychiatrist. Return for worsening pain or any concerns  Anxiety and Panic Attacks Your caregiver has informed you that you are having an anxiety or panic attack. There may be many forms of this. Most of the time these attacks come suddenly and without warning. They come at any time of day, including periods of sleep, and at any time of life. They may be strong and unexplained. Although panic attacks are very scary, they are physically harmless. Sometimes the cause of your anxiety is not known. Anxiety is a protective mechanism of the body in its fight or flight mechanism. Most of these perceived danger situations are actually nonphysical situations (such as anxiety over losing a job). CAUSES  The causes of an anxiety or panic attack are many. Panic attacks may occur in otherwise healthy people given a certain set of circumstances. There may be a genetic cause for panic attacks. Some medications may also have anxiety as a side effect. SYMPTOMS  Some of the most common feelings are:  Intense terror.   Dizziness, feeling faint.   Hot and cold flashes.   Fear of going crazy.   Feelings that nothing is real.   Sweating.   Shaking.   Chest pain or a fast heartbeat (palpitations).   Smothering, choking sensations.   Feelings of impending doom and that death is near.   Tingling of extremities, this may be from over-breathing.   Altered reality (derealization).   Being detached from yourself (depersonalization).  Several symptoms can be present to make up anxiety or panic attacks. DIAGNOSIS  The evaluation by your caregiver will depend on the type of symptoms you are experiencing. The diagnosis of anxiety or panic attack is made when no physical illness can be determined to be a cause of the symptoms. TREATMENT  Treatment to prevent anxiety and panic attacks may include:  Avoidance of circumstances that cause anxiety.   Reassurance and relaxation.   Regular  exercise.   Relaxation therapies, such as yoga.   Psychotherapy with a psychiatrist or therapist.   Avoidance of caffeine, alcohol and illegal drugs.   Prescribed medication.  SEEK IMMEDIATE MEDICAL CARE IF:   You experience panic attack symptoms that are different than your usual symptoms.   You have any worsening or concerning symptoms.  Document Released: 11/24/2005 Document Revised: 11/13/2011 Document Reviewed: 03/28/2010 [facility] Patient Information 2012 [place], [place].

## 2012-03-11 ENCOUNTER — Encounter (HOSPITAL_COMMUNITY): Payer: Self-pay | Admitting: Pharmacy Technician

## 2012-03-15 ENCOUNTER — Emergency Department (HOSPITAL_COMMUNITY)
Admission: EM | Admit: 2012-03-15 | Discharge: 2012-03-15 | Discharge status: Against Medical Advice | Payer: Medicaid Other | Attending: Emergency Medicine | Admitting: Emergency Medicine

## 2012-03-15 ENCOUNTER — Encounter (HOSPITAL_COMMUNITY)
Admission: RE | Admit: 2012-03-15 | Discharge: 2012-03-15 | Disposition: A | Discharge status: Home / Self Care | Payer: Medicaid Other | Source: Ambulatory Visit | Attending: Orthopaedic Surgery | Admitting: Orthopaedic Surgery

## 2012-03-15 ENCOUNTER — Encounter (HOSPITAL_COMMUNITY): Payer: Self-pay | Admitting: *Deleted

## 2012-03-15 ENCOUNTER — Encounter (HOSPITAL_COMMUNITY): Payer: Self-pay

## 2012-03-15 DIAGNOSIS — M79609 Pain in unspecified limb: Secondary | ICD-10-CM | POA: Insufficient documentation

## 2012-03-15 HISTORY — DX: Anxiety disorder, unspecified: F41.9

## 2012-03-15 LAB — URINE MICROSCOPIC-ADD ON

## 2012-03-15 LAB — COMPREHENSIVE METABOLIC PANEL
Albumin: 3.5 g/dL (ref 3.5–5.2)
Alkaline Phosphatase: 64 U/L (ref 39–117)
BUN: 8 mg/dL (ref 6–23)
Potassium: 3.8 mEq/L (ref 3.5–5.1)
Total Protein: 6.7 g/dL (ref 6.0–8.3)

## 2012-03-15 LAB — DIFFERENTIAL
Eosinophils Absolute: 0 10*3/uL (ref 0.0–0.7)
Lymphocytes Relative: 35 % (ref 12–46)
Lymphs Abs: 1.7 10*3/uL (ref 0.7–4.0)
Neutrophils Relative %: 59 % (ref 43–77)

## 2012-03-15 LAB — CBC
Platelets: 274 10*3/uL (ref 150–400)
RBC: 4.56 MIL/uL (ref 3.87–5.11)
WBC: 4.8 10*3/uL (ref 4.0–10.5)

## 2012-03-15 LAB — URINALYSIS, ROUTINE W REFLEX MICROSCOPIC
Glucose, UA: NEGATIVE mg/dL
Leukocytes, UA: NEGATIVE
Protein, ur: NEGATIVE mg/dL
pH: 6 (ref 5.0–8.0)

## 2012-03-15 LAB — SURGICAL PCR SCREEN: MRSA, PCR: NEGATIVE

## 2012-03-15 NOTE — ED Notes (Signed)
Pt not in ED waiting room, pt did not alert staff she was leaving 

## 2012-03-15 NOTE — Patient Instructions (Signed)
19 GL[address] W[address]STROM  03/15/2012   Your procedure is scheduled on:  Friday, March 19, 2012  Report to [facility] at [place] AM.  Call this number if you have problems the morning of surgery: [phone]   Remember:   Do not eat food:After Midnight.  May have clear liquids:until Midnight .  Clear liquids include soda, tea, black coffee, apple or grape juice, broth.  Take these medicines the morning of surgery with A SIP OF WATER: Synthroid, Claritin, Ultram   Do not wear jewelry, make-up or nail polish.  Do not wear lotions, powders, or perfumes. You may wear deodorant.  Do not shave 48 hours prior to surgery.  Do not bring valuables to the hospital.  Contacts, dentures or bridgework may not be worn into surgery.  Leave suitcase in the car. After surgery it may be brought to your room.  For patients admitted to the hospital, checkout time is 11:00 AM the day of discharge.   Patients discharged the day of surgery will not be allowed to drive home.  Name and phone number of your driver: Family  Special Instructions: CHG Shower Use Special Wash: 1/2 bottle night before surgery and 1/2 bottle morning of surgery.   Please read over the following fact sheets that you were given: Pain Booklet, Coughing and Deep Breathing, Surgical Site Infection Prevention, Anesthesia Post-op Instructions and Care and Recovery After Surgery  Carpal Tunnel Release Carpal tunnel release is done to relieve the pressure on the nerves and tendons on the bottom side of your wrist.  LET YOUR CAREGIVER KNOW ABOUT:   Allergies to food or medicine.   Medicines taken, including vitamins, herbs, eyedrops, over-the-counter medicines, and creams.   Use of steroids (by mouth or creams).   Previous problems with anesthetics or numbing medicines.   History of bleeding problems or blood clots.   Previous surgery.   Other health problems, including diabetes and kidney problems.   Possibility of pregnancy, if this  applies.  RISKS AND COMPLICATIONS  Some problems that may happen after this procedure include:  Infection.   Damage to the nerves, arteries or tendons could occur. This would be very uncommon.   Bleeding.  BEFORE THE PROCEDURE   This surgery may be done while you are asleep (general anesthetic) or may be done under a block where only your forearm and the surgical area is numb.   If the surgery is done under a block, the numbness will gradually wear off within several hours after surgery.  HOME CARE INSTRUCTIONS   Have a responsible person with you for 24 hours.   Do not drive a car or use public transportation for 24 hours.   Only take over-the-counter or prescription medicines for pain, discomfort, or fever as directed by your caregiver. Take them as directed.   You may put ice on the palm side of the affected wrist.   Put ice in a plastic bag.   Place a towel between your skin and the bag.   Leave the ice on for 20 to 30 minutes, 4 times per day.   If you were given a splint to keep your wrist from bending, use it as directed. It is important to wear the splint at night or as directed. Use the splint for as long as you have pain or numbness in your hand, arm, or wrist. This may take 1 to 2 months.   Keep your hand raised (elevated) above the level of your heart as much  as possible. This keeps swelling down and helps with discomfort.   Change bandages (dressings) as directed.   Keep the wound clean and dry.  SEEK MEDICAL CARE IF:   You develop pain not relieved with medications.   You develop numbness of your hand.   You develop bleeding from your surgical site.   You have an oral temperature above 102 F (38.9 C).   You develop redness or swelling of the surgical site.   You develop new, unexplained problems.  SEEK IMMEDIATE MEDICAL CARE IF:   You develop a rash.   You have difficulty breathing.   You develop any reaction or side effects to medications  given.  Document Released: 02/14/2004 Document Revised: 11/13/2011 Document Reviewed: 09/30/2007 [facility] Patient Information 2012 [place], [place].  PATIENT INSTRUCTIONS POST-ANESTHESIA  IMMEDIATELY FOLLOWING SURGERY:  Do not drive or operate machinery for the first twenty four hours after surgery.  Do not make any important decisions for twenty four hours after surgery or while taking narcotic pain medications or sedatives.  If you develop intractable nausea and vomiting or a severe headache please notify your doctor immediately.  FOLLOW-UP:  Please make an appointment with your surgeon as instructed. You do not need to follow up with anesthesia unless specifically instructed to do so.  WOUND CARE INSTRUCTIONS (if applicable):  Keep a dry clean dressing on the anesthesia/puncture wound site if there is drainage.  Once the wound has quit draining you may leave it open to air.  Generally you should leave the bandage intact for twenty four hours unless there is drainage.  If the epidural site drains for more than 36-48 hours please call the anesthesia department.  QUESTIONS?:  Please feel free to call your physician or the hospital operator if you have any questions, and they will be happy to assist you.     [facility] Anesthesia Department [address] [place] [phone]

## 2012-03-15 NOTE — ED Notes (Signed)
Pain lt arm and leg, says she had fever earlier today and feels she is "twitching " in lt shoulder and neck
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
## 2012-03-19 ENCOUNTER — Encounter (HOSPITAL_COMMUNITY)
Admission: RE | Disposition: A | Discharge status: Home / Self Care | Payer: Self-pay | Source: Ambulatory Visit | Attending: Orthopaedic Surgery

## 2012-03-19 ENCOUNTER — Ambulatory Visit (HOSPITAL_COMMUNITY): Payer: Medicaid Other | Admitting: Anesthesiology

## 2012-03-19 ENCOUNTER — Encounter (HOSPITAL_COMMUNITY): Payer: Self-pay | Admitting: *Deleted

## 2012-03-19 ENCOUNTER — Encounter (HOSPITAL_COMMUNITY): Payer: Self-pay | Admitting: Anesthesiology

## 2012-03-19 ENCOUNTER — Ambulatory Visit (HOSPITAL_COMMUNITY)
Admission: RE | Admit: 2012-03-19 | Discharge: 2012-03-19 | Disposition: A | Discharge status: Home / Self Care | Payer: Medicaid Other | Source: Ambulatory Visit | Attending: Orthopaedic Surgery | Admitting: Orthopaedic Surgery

## 2012-03-19 DIAGNOSIS — Z01812 Encounter for preprocedural laboratory examination: Secondary | ICD-10-CM | POA: Insufficient documentation

## 2012-03-19 DIAGNOSIS — Z01810 Encounter for preprocedural cardiovascular examination: Secondary | ICD-10-CM | POA: Insufficient documentation

## 2012-03-19 DIAGNOSIS — G5600 Carpal tunnel syndrome, unspecified upper limb: Secondary | ICD-10-CM | POA: Insufficient documentation

## 2012-03-19 HISTORY — PX: CARPAL TUNNEL RELEASE: SHX101

## 2012-03-19 SURGERY — CARPAL TUNNEL RELEASE
Anesthesia: Regional | Site: Wrist | Laterality: Left | Wound class: Clean

## 2012-03-19 MED ORDER — SODIUM CHLORIDE 0.9 % IR SOLN
Status: DC | PRN
  Administered 2012-03-19: 1000 mL

## 2012-03-19 MED ORDER — MIDAZOLAM HCL 2 MG/2ML IJ SOLN
INTRAMUSCULAR | Status: AC
  Filled 2012-03-19: qty 2

## 2012-03-19 MED ORDER — FENTANYL CITRATE 0.05 MG/ML IJ SOLN
25.0000 ug | INTRAMUSCULAR | Status: DC | PRN
  Administered 2012-03-19 (×4): 50 ug via INTRAVENOUS

## 2012-03-19 MED ORDER — GLYCOPYRROLATE 0.2 MG/ML IJ SOLN
0.2000 mg | Freq: Once | INTRAMUSCULAR | Status: AC
  Administered 2012-03-19: 0.2 mg via INTRAVENOUS

## 2012-03-19 MED ORDER — PROPOFOL 10 MG/ML IV EMUL
INTRAVENOUS | Status: AC
  Filled 2012-03-19: qty 20

## 2012-03-19 MED ORDER — FENTANYL CITRATE 0.05 MG/ML IJ SOLN
INTRAMUSCULAR | Status: AC
  Administered 2012-03-19: 50 ug via INTRAVENOUS
  Filled 2012-03-19: qty 2

## 2012-03-19 MED ORDER — ONDANSETRON HCL 4 MG/2ML IJ SOLN
INTRAMUSCULAR | Status: AC
  Filled 2012-03-19: qty 2

## 2012-03-19 MED ORDER — ACETAMINOPHEN 325 MG PO TABS
325.0000 mg | ORAL_TABLET | ORAL | Status: DC | PRN
  Administered 2012-03-19: 650 mg via ORAL

## 2012-03-19 MED ORDER — GLYCOPYRROLATE 0.2 MG/ML IJ SOLN
INTRAMUSCULAR | Status: AC
  Administered 2012-03-19: 0.2 mg via INTRAVENOUS
  Filled 2012-03-19: qty 1

## 2012-03-19 MED ORDER — PROPOFOL 10 MG/ML IV EMUL
INTRAVENOUS | Status: DC | PRN
  Administered 2012-03-19: 50 ug/kg/min via INTRAVENOUS

## 2012-03-19 MED ORDER — LACTATED RINGERS IV SOLN
INTRAVENOUS | Status: DC
  Administered 2012-03-19: 1000 mL via INTRAVENOUS

## 2012-03-19 MED ORDER — MIDAZOLAM HCL 2 MG/2ML IJ SOLN
1.0000 mg | INTRAMUSCULAR | Status: DC | PRN
  Administered 2012-03-19: 2 mg via INTRAVENOUS

## 2012-03-19 MED ORDER — ACETAMINOPHEN 325 MG PO TABS
ORAL_TABLET | ORAL | Status: AC
  Administered 2012-03-19: 650 mg via ORAL
  Filled 2012-03-19: qty 2

## 2012-03-19 MED ORDER — MIDAZOLAM HCL 5 MG/5ML IJ SOLN
INTRAMUSCULAR | Status: DC | PRN
  Administered 2012-03-19: 2 mg via INTRAVENOUS

## 2012-03-19 MED ORDER — FENTANYL CITRATE 0.05 MG/ML IJ SOLN
INTRAMUSCULAR | Status: DC | PRN
  Administered 2012-03-19 (×2): 50 ug via INTRAVENOUS

## 2012-03-19 MED ORDER — LIDOCAINE HCL (PF) 0.5 % IJ SOLN
INTRAMUSCULAR | Status: AC
  Filled 2012-03-19: qty 50

## 2012-03-19 MED ORDER — LIDOCAINE HCL (PF) 1 % IJ SOLN
INTRAMUSCULAR | Status: AC
  Filled 2012-03-19: qty 5

## 2012-03-19 MED ORDER — ONDANSETRON HCL 4 MG/2ML IJ SOLN
4.0000 mg | Freq: Once | INTRAMUSCULAR | Status: AC
  Administered 2012-03-19: 4 mg via INTRAVENOUS

## 2012-03-19 MED ORDER — SODIUM CHLORIDE 0.9 % IJ SOLN
INTRAMUSCULAR | Status: DC | PRN
  Administered 2012-03-19: 2 mL

## 2012-03-19 MED ORDER — ONDANSETRON HCL 4 MG/2ML IJ SOLN: 4.0000 mg | Freq: Once | INTRAMUSCULAR | Status: DC | PRN

## 2012-03-19 MED ORDER — LIDOCAINE HCL (PF) 0.5 % IJ SOLN
INTRAMUSCULAR | Status: DC | PRN
  Administered 2012-03-19: 250 mg via INTRATHECAL

## 2012-03-19 MED ORDER — MIDAZOLAM HCL 2 MG/2ML IJ SOLN
INTRAMUSCULAR | Status: AC
  Administered 2012-03-19: 2 mg via INTRAVENOUS
  Filled 2012-03-19: qty 2

## 2012-03-19 SURGICAL SUPPLY — 41 items
BAG HAMPER (MISCELLANEOUS) ×2 IMPLANT
BANDAGE ELASTIC 3 VELCRO NS (GAUZE/BANDAGES/DRESSINGS) ×2 IMPLANT
BANDAGE ESMARK 4X12 BL STRL LF (DISPOSABLE) ×1 IMPLANT
BLADE SURG 15 STRL LF DISP TIS (BLADE) ×1 IMPLANT
BLADE SURG 15 STRL SS (BLADE) ×2
BNDG CMPR 12X4 ELC STRL LF (DISPOSABLE) ×1
BNDG ESMARK 4X12 BLUE STRL LF (DISPOSABLE) ×2
CLOTH BEACON ORANGE TIMEOUT ST (SAFETY) ×2 IMPLANT
COVER SURGICAL LIGHT HANDLE (MISCELLANEOUS) ×4 IMPLANT
CUFF TOURNIQUET SINGLE 18IN (TOURNIQUET CUFF) ×2 IMPLANT
DRSG XEROFORM 1X8 (GAUZE/BANDAGES/DRESSINGS) ×1 IMPLANT
ELECT NDL TIP 2.8 STRL (NEEDLE) IMPLANT
ELECT NEEDLE TIP 2.8 STRL (NEEDLE) IMPLANT
ELECT REM PT RETURN 9FT ADLT (ELECTROSURGICAL) ×2
ELECTRODE REM PT RTRN 9FT ADLT (ELECTROSURGICAL) ×1 IMPLANT
FORMALIN 10 PREFIL 120ML (MISCELLANEOUS) ×2 IMPLANT
GLOVE BIO SURGEON STRL SZ8 (GLOVE) ×1 IMPLANT
GLOVE BIO SURGEON STRL SZ8.5 (GLOVE) ×1 IMPLANT
GLOVE BIOGEL PI IND STRL 7.0 (GLOVE) IMPLANT
GLOVE BIOGEL PI IND STRL 8 (GLOVE) IMPLANT
GLOVE BIOGEL PI INDICATOR 7.0 (GLOVE) ×2
GLOVE BIOGEL PI INDICATOR 8 (GLOVE) ×1
GLOVE SKINSENSE NS SZ6.5 (GLOVE) ×2
GLOVE SKINSENSE STRL SZ6.5 (GLOVE) IMPLANT
GOWN STRL REIN XL XLG (GOWN DISPOSABLE) ×6 IMPLANT
KIT ROOM TURNOVER APOR (KITS) ×2 IMPLANT
NDL HYPO 27GX1-1/4 (NEEDLE) ×1 IMPLANT
NEEDLE HYPO 18GX1.5 BLUNT FILL (NEEDLE) ×2 IMPLANT
NEEDLE HYPO 27GX1-1/4 (NEEDLE) ×2 IMPLANT
NS IRRIG 1000ML POUR BTL (IV SOLUTION) ×2 IMPLANT
PACK BASIC LIMB (CUSTOM PROCEDURE TRAY) ×2 IMPLANT
PAD ARMBOARD 7.5X6 YLW CONV (MISCELLANEOUS) ×2 IMPLANT
PAD CAST 3X4 CTTN HI CHSV (CAST SUPPLIES) ×1 IMPLANT
PADDING CAST COTTON 3X4 STRL (CAST SUPPLIES) ×2
SET BASIN LINEN APH (SET/KITS/TRAYS/PACK) ×2 IMPLANT
SOL PREP PROV IODINE SCRUB 4OZ (MISCELLANEOUS) ×2 IMPLANT
SPONGE GAUZE 4X4 12PLY (GAUZE/BANDAGES/DRESSINGS) ×2 IMPLANT
SUT ETHILON 3 0 FSL (SUTURE) ×2 IMPLANT
SYR 3ML LL SCALE MARK (SYRINGE) ×2 IMPLANT
TOWEL OR 17X26 4PK STRL BLUE (TOWEL DISPOSABLE) ×1 IMPLANT
VESSEL LOOPS MAXI RED (MISCELLANEOUS) ×2 IMPLANT

## 2012-03-19 NOTE — Anesthesia Preprocedure Evaluation (Signed)
Anesthesia Evaluation  Patient identified by MRN, date of birth, ID band  Reviewed: Allergy & Precautions, H&P , NPO status , Patient's Chart, lab work & pertinent test results  Airway Mallampati: I TM Distance: >3 FB Neck ROM: Full    Dental No notable dental hx.    Pulmonary shortness of breath,    Pulmonary exam normal       Cardiovascular negative cardio ROS  Rhythm:Regular Rate:Normal     Neuro/Psych PSYCHIATRIC DISORDERS Anxiety    GI/Hepatic Neg liver ROS, GERD-  ,  Endo/Other  Hypothyroidism   Renal/GU negative Renal ROS     Musculoskeletal negative musculoskeletal ROS (+)   Abdominal Normal abdominal exam  (+)   Peds  Hematology negative hematology ROS (+)   Anesthesia Other Findings   Reproductive/Obstetrics negative OB ROS                           Anesthesia Physical Anesthesia Plan  ASA: II  Anesthesia Plan: Bier Block   Post-op Pain Management:    Induction: Intravenous  Airway Management Planned: Nasal Cannula  Additional Equipment:   Intra-op Plan:   Post-operative Plan:   Informed Consent: I have reviewed the patients History and Physical, chart, labs and discussed the procedure including the risks, benefits and alternatives for the proposed anesthesia with the patient or authorized representative who has indicated his/her understanding and acceptance.     Plan Discussed with: CRNA  Anesthesia Plan Comments:         Anesthesia Quick Evaluation

## 2012-03-19 NOTE — Brief Op Note (Signed)
03/19/2012  8:22 AM  PATIENT:  [name]  31 y.o. female  PRE-OPERATIVE DIAGNOSIS:  carpal tunnel syndrome left wrist  POST-OPERATIVE DIAGNOSIS:  carpal tunnel syndrome left wrist  PROCEDURE:  Procedure(s) (LRB): CARPAL TUNNEL RELEASE (Left)  SURGEON:  Surgeon(s) and Role:    * Darreld Mclean, MD - Primary  PHYSICIAN ASSISTANT:   ASSISTANTS: none   ANESTHESIA:   regional  EBL:  Total I/O In: 100 [I.V.:100] Out: -   BLOOD ADMINISTERED:none  DRAINS: none   LOCAL MEDICATIONS USED:  NONE  SPECIMEN:  Source of Specimen:  Volar carpal ligament left  DISPOSITION OF SPECIMEN:  PATHOLOGY  COUNTS:  YES  TOURNIQUET:  * Missing tourniquet times found for documented tourniquets in log:  32620 *  DICTATION: .Other Dictation: Dictation Number [id number]  PLAN OF CARE: Discharge to home after PACU  PATIENT DISPOSITION:  PACU - hemodynamically stable.   Delay start of Pharmacological VTE agent (>24hrs) due to surgical blood loss or risk of bleeding: not applicable

## 2012-03-19 NOTE — Anesthesia Postprocedure Evaluation (Signed)
  Anesthesia Post-op Note  Patient: Kristina Li  Procedure(s) Performed: Procedure(s) (LRB): CARPAL TUNNEL RELEASE (Left)  Patient Location: PACU  Anesthesia Type: Bier block  Level of Consciousness: awake, alert , oriented and patient cooperative  Airway and Oxygen Therapy: Patient Spontanous Breathing  Post-op Pain: none  Post-op Assessment: Post-op Vital signs reviewed, Patient's Cardiovascular Status Stable, Respiratory Function Stable, Patent Airway, No signs of Nausea or vomiting and Pain level controlled  Post-op Vital Signs: Reviewed and stable  Complications: No apparent anesthesia complications

## 2012-03-19 NOTE — Transfer of Care (Signed)
Immediate Anesthesia Transfer of Care Note  Patient: Kristina Li  Procedure(s) Performed: Procedure(s) (LRB): CARPAL TUNNEL RELEASE (Left)  Patient Location: PACU  Anesthesia Type: Bier block  Level of Consciousness: awake, alert  and patient cooperative  Airway & Oxygen Therapy: Patient Spontanous Breathing and Patient connected to face mask oxygen  Post-op Assessment: Report given to PACU RN, Post -op Vital signs reviewed and stable and Patient moving all extremities  Post vital signs: Reviewed and stable  Complications: No apparent anesthesia complications

## 2012-03-19 NOTE — Anesthesia Procedure Notes (Signed)
Anesthesia Regional Block:  Bier block (IV Regional)  Pre-Anesthetic Checklist: ,,, Correct Patient, Correct Site, Correct Laterality, Correct Procedure, Correct Position, site marked, risks and benefits discussed, Surgical consent, Pre-op evaluation  Laterality: Left  Prep: prepped with other and 3.3% PCMX       Needles:  Injection technique: Single-shot      Additional Needles: Bier block (IV Regional) Narrative:  Start time: 03/19/2012 7:55 AM Resident/CRNA: Jiles Harold

## 2012-03-19 NOTE — Discharge Instructions (Signed)
Keep wound dry.   Move fingers often.  See Dr. Hilda Lias next Friday for dressing change.  Call if any problem.  Office [phone], hospital [phone].  Carpal Tunnel Release Carpal tunnel release is done to relieve the pressure on the nerves and tendons on the bottom side of your wrist.  LET YOUR CAREGIVER KNOW ABOUT:   Allergies to food or medicine.   Medicines taken, including vitamins, herbs, eyedrops, over-the-counter medicines, and creams.   Use of steroids (by mouth or creams).   Previous problems with anesthetics or numbing medicines.   History of bleeding problems or blood clots.   Previous surgery.   Other health problems, including diabetes and kidney problems.   Possibility of pregnancy, if this applies.  RISKS AND COMPLICATIONS  Some problems that may happen after this procedure include:  Infection.   Damage to the nerves, arteries or tendons could occur. This would be very uncommon.   Bleeding.  BEFORE THE PROCEDURE   This surgery may be done while you are asleep (general anesthetic) or may be done under a block where only your forearm and the surgical area is numb.   If the surgery is done under a block, the numbness will gradually wear off within several hours after surgery.  HOME CARE INSTRUCTIONS   Have a responsible person with you for 24 hours.   Do not drive a car or use public transportation for 24 hours.   Only take over-the-counter or prescription medicines for pain, discomfort, or fever as directed by your caregiver. Take them as directed.   You may put ice on the palm side of the affected wrist.   Put ice in a plastic bag.   Place a towel between your skin and the bag.   Leave the ice on for 20 to 30 minutes, 4 times per day.   If you were given a splint to keep your wrist from bending, use it as directed. It is important to wear the splint at night or as directed. Use the splint for as long as you have pain or numbness in your hand, arm,  or wrist. This may take 1 to 2 months.   Keep your hand raised (elevated) above the level of your heart as much as possible. This keeps swelling down and helps with discomfort.   Change bandages (dressings) as directed.   Keep the wound clean and dry.  SEEK MEDICAL CARE IF:   You develop pain not relieved with medications.   You develop numbness of your hand.   You develop bleeding from your surgical site.   You have an oral temperature above 102 F (38.9 C).   You develop redness or swelling of the surgical site.   You develop new, unexplained problems.  SEEK IMMEDIATE MEDICAL CARE IF:   You develop a rash.   You have difficulty breathing.   You develop any reaction or side effects to medications given.  Document Released: 02/14/2004 Document Revised: 11/13/2011 Document Reviewed: 09/30/2007 [facility] Patient Information 2012 [place], [place].

## 2012-03-19 NOTE — Progress Notes (Signed)
The History and Physical is unchanged. I have examined the patient. The patient is medically able to have surgery on the left hand for carpal tunnel release . [employer] Z[employer]da  

## 2012-03-19 NOTE — Op Note (Signed)
NAME:  Kristina Li, Kristina Li NO.:  [id number]  MEDICAL RECORD NO.:  [id number]  LOCATION:  [facility]                          FACILITY:  [facility]  PHYSICIAN:  J. Darreld Mclean, M.D. DATE OF BIRTH:  [birth year]  DATE OF PROCEDURE:  03/19/2012 DATE OF DISCHARGE:                              OPERATIVE REPORT   PREOPERATIVE DIAGNOSIS:  Carpal tunnel syndrome, left.  POSTOPERATIVE DIAGNOSIS:  Carpal tunnel syndrome, left.  PROCEDURE:  Release of volar carpal ligament, saline neurolysis, epineurotomy, left median nerve.  ANESTHESIA:  Bier block.  Please refer Anesthesia record for tourniquet time.  SURGEON:  J. Darreld Mclean, M.D.  INDICATIONS:  The patient is a 31 year old female who has a history of bilateral carpal tunnel.  In early March, I did a right carpal tunnel release.  She did well and now she is for the left carpal tunnel release.  Prior to the surgery, she had a positive EMG showing carpal tunnel syndrome bilaterally.  She had no response to medications or wrist splint.  She had been a patient of Dr. Felecia Shelling.  She has done well from a carpal tunnel release on the right, and now she wants to have carpal tunnel release on the left.  This is an elective procedure.  She received from me one that happened just recently, had it in March on the other side.  DESCRIPTION OF PROCEDURE:  The patient was seen in the holding area. The left wrist was identified as correct surgical site.  I placed a mark on the left wrist.  She was brought to the operating room, placed supine and given Bier block anesthesia.  She was prepped and draped in the usual manner.  After generalized time-out, identifying the patient as [name], doing a left carpal tunnel surgery.  All instrumentation in proper position and working, and the OR team knew each other.  I made a mark on the skin as the outline for the incision.  With careful dissection, the median nerve was identified proximally.  A  vessel loop was placed around the nerve.  A groove directed in place within the carpal tunnel space and the volar carpal ligament incised.  Specimen of volar carpal ligament sent to pathology.  Saline neurolysis and epineurotomy was carried out as median nerve was markedly compressed. Specimen of volar carpal ligament was sent to pathology.  Wound was then reapproximated using 3-0 nylon in interrupted vertical mattress manner. Sterile bulky dressing applied.  Sheet cotton applied, sheet cotton cut dorsally.  ACE bandage applied loosely.  She was given appropriate medication for pain.  I will see her in the office in approximately 1 week.  If any difficulties, contact me through the office hospital beeper system.          ______________________________ Shela Commons. Darreld Mclean, M.D.     JWK/MEDQ  D:  03/19/2012  T:  03/19/2012  Job:  [id number]  cc:   Tesfaye D. Felecia Shelling, MD Fax: [phone]

## 2012-03-20 ENCOUNTER — Encounter (HOSPITAL_COMMUNITY): Payer: Self-pay | Admitting: *Deleted

## 2012-03-20 ENCOUNTER — Emergency Department (HOSPITAL_COMMUNITY)
Admission: EM | Admit: 2012-03-20 | Discharge: 2012-03-20 | Disposition: A | Discharge status: Home / Self Care | Payer: Medicaid Other | Attending: Emergency Medicine | Admitting: Emergency Medicine

## 2012-03-20 DIAGNOSIS — F4310 Post-traumatic stress disorder, unspecified: Secondary | ICD-10-CM | POA: Insufficient documentation

## 2012-03-20 DIAGNOSIS — Z9889 Other specified postprocedural states: Secondary | ICD-10-CM | POA: Insufficient documentation

## 2012-03-20 DIAGNOSIS — Z886 Allergy status to analgesic agent status: Secondary | ICD-10-CM | POA: Insufficient documentation

## 2012-03-20 DIAGNOSIS — K219 Gastro-esophageal reflux disease without esophagitis: Secondary | ICD-10-CM | POA: Insufficient documentation

## 2012-03-20 DIAGNOSIS — M79642 Pain in left hand: Secondary | ICD-10-CM

## 2012-03-20 DIAGNOSIS — Z85850 Personal history of malignant neoplasm of thyroid: Secondary | ICD-10-CM | POA: Insufficient documentation

## 2012-03-20 DIAGNOSIS — M79609 Pain in unspecified limb: Secondary | ICD-10-CM | POA: Insufficient documentation

## 2012-03-20 DIAGNOSIS — Z91013 Allergy to seafood: Secondary | ICD-10-CM | POA: Insufficient documentation

## 2012-03-20 MED ORDER — HYDROCODONE-ACETAMINOPHEN 5-325 MG PO TABS: 1.0000 | ORAL_TABLET | Freq: Four times a day (QID) | ORAL | Status: AC | PRN

## 2012-03-20 MED ORDER — HYDROMORPHONE HCL PF 2 MG/ML IJ SOLN
2.0000 mg | Freq: Once | INTRAMUSCULAR | Status: AC
  Administered 2012-03-20: 2 mg via INTRAMUSCULAR
  Filled 2012-03-20: qty 1

## 2012-03-20 NOTE — ED Notes (Signed)
Pt had carpal tunnel release done yesterday and is in severe pain today, took tramadol without relief at 1700
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
## 2012-03-20 NOTE — Discharge Instructions (Signed)
Continue elevate arm take pain medicine as directed. Get Dr. Hilda Lias the colon Monday for followup. Return for any new or worse symptoms particularly if there is increased pain with movement of fingers or numbness in the fingers.

## 2012-03-20 NOTE — ED Notes (Signed)
Pt reporting severe pain in left wrist.  Pt reports she had surgery yesterday and has had no relief from tramadol prescribed.  Last dose taken about 1700.  Cap refill <3 seconds, pt able to move fingers.  Denies numbness or tingling.

## 2012-03-24 ENCOUNTER — Encounter (HOSPITAL_COMMUNITY): Payer: Self-pay | Admitting: Orthopaedic Surgery

## 2012-04-05 ENCOUNTER — Other Ambulatory Visit: Payer: Self-pay | Admitting: Neurology

## 2012-04-05 DIAGNOSIS — R29898 Other symptoms and signs involving the musculoskeletal system: Secondary | ICD-10-CM

## 2012-04-08 ENCOUNTER — Ambulatory Visit (HOSPITAL_COMMUNITY)
Admission: RE | Admit: 2012-04-08 | Discharge: 2012-04-08 | Disposition: A | Discharge status: Home / Self Care | Payer: Medicaid Other | Source: Ambulatory Visit | Attending: Neurology | Admitting: Neurology

## 2012-04-08 DIAGNOSIS — R51 Headache: Secondary | ICD-10-CM | POA: Insufficient documentation

## 2012-04-08 DIAGNOSIS — R29898 Other symptoms and signs involving the musculoskeletal system: Secondary | ICD-10-CM | POA: Insufficient documentation

## 2012-04-08 DIAGNOSIS — R209 Unspecified disturbances of skin sensation: Secondary | ICD-10-CM | POA: Insufficient documentation

## 2012-04-15 ENCOUNTER — Emergency Department (HOSPITAL_COMMUNITY): Payer: Medicaid Other

## 2012-04-15 ENCOUNTER — Encounter (HOSPITAL_COMMUNITY): Payer: Self-pay | Admitting: Emergency Medicine

## 2012-04-15 ENCOUNTER — Emergency Department (HOSPITAL_COMMUNITY)
Admission: EM | Admit: 2012-04-15 | Discharge: 2012-04-15 | Disposition: A | Discharge status: Home / Self Care | Payer: Medicaid Other | Attending: Emergency Medicine | Admitting: Emergency Medicine

## 2012-04-15 DIAGNOSIS — Z79899 Other long term (current) drug therapy: Secondary | ICD-10-CM | POA: Insufficient documentation

## 2012-04-15 DIAGNOSIS — K219 Gastro-esophageal reflux disease without esophagitis: Secondary | ICD-10-CM | POA: Insufficient documentation

## 2012-04-15 DIAGNOSIS — M79609 Pain in unspecified limb: Secondary | ICD-10-CM | POA: Insufficient documentation

## 2012-04-15 DIAGNOSIS — R079 Chest pain, unspecified: Secondary | ICD-10-CM | POA: Insufficient documentation

## 2012-04-15 DIAGNOSIS — M542 Cervicalgia: Secondary | ICD-10-CM | POA: Insufficient documentation

## 2012-04-15 MED ORDER — OXYCODONE-ACETAMINOPHEN 5-325 MG PO TABS
1.0000 | ORAL_TABLET | Freq: Once | ORAL | Status: AC
  Administered 2012-04-15: 1 via ORAL
  Filled 2012-04-15: qty 1
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
## 2012-04-15 NOTE — ED Notes (Signed)
Patient placed in C-collar in triage

## 2012-04-20 ENCOUNTER — Emergency Department (HOSPITAL_COMMUNITY)
Admission: EM | Admit: 2012-04-20 | Discharge: 2012-04-20 | Disposition: A | Discharge status: Home / Self Care | Payer: Medicaid Other | Attending: Emergency Medicine | Admitting: Emergency Medicine

## 2012-04-20 ENCOUNTER — Other Ambulatory Visit: Payer: Self-pay

## 2012-04-20 ENCOUNTER — Emergency Department (HOSPITAL_COMMUNITY): Payer: Medicaid Other

## 2012-04-20 ENCOUNTER — Encounter (HOSPITAL_COMMUNITY): Payer: Self-pay | Admitting: *Deleted

## 2012-04-20 DIAGNOSIS — R0602 Shortness of breath: Secondary | ICD-10-CM | POA: Insufficient documentation

## 2012-04-20 DIAGNOSIS — R0789 Other chest pain: Secondary | ICD-10-CM | POA: Insufficient documentation

## 2012-04-20 DIAGNOSIS — K219 Gastro-esophageal reflux disease without esophagitis: Secondary | ICD-10-CM | POA: Insufficient documentation

## 2012-04-20 LAB — COMPREHENSIVE METABOLIC PANEL
BUN: 7 mg/dL (ref 6–23)
CO2: 22 mEq/L (ref 19–32)
Calcium: 9.2 mg/dL (ref 8.4–10.5)
Creatinine, Ser: 0.9 mg/dL (ref 0.50–1.10)
GFR calc Af Amer: >90 mL/min (ref >90)
GFR calc non Af Amer: 84 mL/min — ABNORMAL LOW (ref >90)
Glucose, Bld: 86 mg/dL (ref 70–99)

## 2012-04-20 LAB — DIFFERENTIAL
Eosinophils Relative: 1 % (ref 0–5)
Lymphocytes Relative: 27 % (ref 12–46)
Lymphs Abs: 2 10*3/uL (ref 0.7–4.0)
Monocytes Absolute: 0.5 10*3/uL (ref 0.1–1.0)

## 2012-04-20 LAB — POCT PREGNANCY, URINE: Preg Test, Ur: NEGATIVE

## 2012-04-20 LAB — CBC
HCT: 37.7 % (ref 36.0–46.0)
MCV: 81.3 fL (ref 78.0–100.0)
RBC: 4.64 MIL/uL (ref 3.87–5.11)
WBC: 7.4 10*3/uL (ref 4.0–10.5)

## 2012-04-20 MED ORDER — HYDROCODONE-ACETAMINOPHEN 5-500 MG PO TABS: 1.0000 | ORAL_TABLET | Freq: Four times a day (QID) | ORAL | Status: AC | PRN

## 2012-04-20 MED ORDER — ALPRAZOLAM 0.25 MG PO TABS: 0.2500 mg | ORAL_TABLET | Freq: Every evening | ORAL | Status: AC | PRN

## 2012-04-20 MED ORDER — KETOROLAC TROMETHAMINE 60 MG/2ML IM SOLN: 60.0000 mg | Freq: Once | INTRAMUSCULAR | Status: DC

## 2012-04-20 NOTE — ED Notes (Addendum)
Lt chest pain, for mos intermittently, but constant since Sunday.  Sl sob, no no/v .  Pt has rx with her with order for EKG, by Dr Felecia Shelling
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
## 2012-04-20 NOTE — Discharge Instructions (Signed)
Chest Pain (Nonspecific) It is often hard to give a specific diagnosis for the cause of chest pain. There is always a chance that your pain could be related to something serious, such as a heart attack or a blood clot in the lungs. You need to follow up with your caregiver for further evaluation. CAUSES   Heartburn.   Pneumonia or bronchitis.   Anxiety or stress.   Inflammation around your heart (pericarditis) or lung (pleuritis or pleurisy).   A blood clot in the lung.   A collapsed lung (pneumothorax). It can develop suddenly on its own (spontaneous pneumothorax) or from injury (trauma) to the chest.   Shingles infection (herpes zoster virus).  The chest wall is composed of bones, muscles, and cartilage. Any of these can be the source of the pain.  The bones can be bruised by injury.   The muscles or cartilage can be strained by coughing or overwork.   The cartilage can be affected by inflammation and become sore (costochondritis).  DIAGNOSIS  Lab tests or other studies, such as X-rays, electrocardiography, stress testing, or cardiac imaging, may be needed to find the cause of your pain.  TREATMENT   Treatment depends on what may be causing your chest pain. Treatment may include:   Acid blockers for heartburn.   Anti-inflammatory medicine.   Pain medicine for inflammatory conditions.   Antibiotics if an infection is present.   You may be advised to change lifestyle habits. This includes stopping smoking and avoiding alcohol, caffeine, and chocolate.   You may be advised to keep your head raised (elevated) when sleeping. This reduces the chance of acid going backward from your stomach into your esophagus.   Most of the time, nonspecific chest pain will improve within 2 to 3 days with rest and mild pain medicine.  HOME CARE INSTRUCTIONS   If antibiotics were prescribed, take your antibiotics as directed. Finish them even if you start to feel better.   For the next few  days, avoid physical activities that bring on chest pain. Continue physical activities as directed.   Do not smoke.   Avoid drinking alcohol.   Only take over-the-counter or prescription medicine for pain, discomfort, or fever as directed by your caregiver.   Follow your caregiver's suggestions for further testing if your chest pain does not go away.   Keep any follow-up appointments you made. If you do not go to an appointment, you could develop lasting (chronic) problems with pain. If there is any problem keeping an appointment, you must call to reschedule.  SEEK MEDICAL CARE IF:   You think you are having problems from the medicine you are taking. Read your medicine instructions carefully.   Your chest pain does not go away, even after treatment.   You develop a rash with blisters on your chest.  SEEK IMMEDIATE MEDICAL CARE IF:   You have increased chest pain or pain that spreads to your arm, neck, jaw, back, or abdomen.   You develop shortness of breath, an increasing cough, or you are coughing up blood.   You have severe back or abdominal pain, feel nauseous, or vomit.   You develop severe weakness, fainting, or chills.   You have a fever.  THIS IS AN EMERGENCY. Do not wait to see if the pain will go away. Get medical help at once. Call your local emergency services (911 in [place] Do not drive yourself to the hospital. MAKE SURE YOU:   Understand these instructions.     Will watch your condition.   Will get help right away if you are not doing well or get worse.  Document Released: 09/03/2005 Document Revised: 11/13/2011 Document Reviewed: 06/29/2008 [employer] Patient Information 2012 [employer]. 

## 2012-04-26 ENCOUNTER — Emergency Department (HOSPITAL_COMMUNITY)
Admission: EM | Admit: 2012-04-26 | Discharge: 2012-04-27 | Disposition: A | Discharge status: Home / Self Care | Payer: Medicaid Other | Attending: Emergency Medicine | Admitting: Emergency Medicine

## 2012-04-26 DIAGNOSIS — R0789 Other chest pain: Secondary | ICD-10-CM

## 2012-04-26 DIAGNOSIS — F411 Generalized anxiety disorder: Secondary | ICD-10-CM | POA: Insufficient documentation

## 2012-04-26 DIAGNOSIS — R071 Chest pain on breathing: Secondary | ICD-10-CM | POA: Insufficient documentation

## 2012-04-26 DIAGNOSIS — F4310 Post-traumatic stress disorder, unspecified: Secondary | ICD-10-CM | POA: Insufficient documentation

## 2012-04-26 DIAGNOSIS — Z85850 Personal history of malignant neoplasm of thyroid: Secondary | ICD-10-CM | POA: Insufficient documentation

## 2012-04-27 ENCOUNTER — Emergency Department (HOSPITAL_COMMUNITY): Payer: Medicaid Other

## 2012-04-27 ENCOUNTER — Encounter (HOSPITAL_COMMUNITY): Payer: Self-pay | Admitting: Emergency Medicine

## 2012-04-27 LAB — D-DIMER, QUANTITATIVE: D-Dimer, Quant: 0.28 ug/mL-FEU (ref 0.00–0.48)

## 2012-04-27 LAB — PREGNANCY, URINE: Preg Test, Ur: NEGATIVE

## 2012-04-27 MED ORDER — PREDNISONE 20 MG PO TABS
60.0000 mg | ORAL_TABLET | Freq: Once | ORAL | Status: AC
  Administered 2012-04-27: 60 mg via ORAL
  Filled 2012-04-27: qty 3

## 2012-04-27 MED ORDER — SODIUM CHLORIDE 0.9 % IV SOLN
Freq: Once | INTRAVENOUS | Status: AC
  Administered 2012-04-27: 01:00:00 via INTRAVENOUS

## 2012-04-27 MED ORDER — IOHEXOL 350 MG/ML SOLN
100.0000 mL | Freq: Once | INTRAVENOUS | Status: AC | PRN
  Administered 2012-04-27: 100 mL via INTRAVENOUS

## 2012-04-27 MED ORDER — PREDNISONE 10 MG PO TABS: 20.0000 mg | ORAL_TABLET | Freq: Every day | ORAL | Status: AC

## 2012-04-27 MED ORDER — HYDROMORPHONE HCL PF 1 MG/ML IJ SOLN
1.0000 mg | Freq: Once | INTRAMUSCULAR | Status: AC
  Administered 2012-04-27: 1 mg via INTRAVENOUS
  Filled 2012-04-27: qty 1

## 2012-04-27 NOTE — ED Notes (Signed)
Patient c/o left sided chest pain x several weeks.  States has been seen by ER and her PMD.

## 2012-04-27 NOTE — ED Notes (Signed)
Gave patient grape juice as requested and approved by MD.
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
## 2012-04-27 NOTE — Discharge Instructions (Signed)
The CT of your chest did not show anything going on in your chest. There are no blood clots, masses, broken bones, infections. Apply heat to the area for comfort. Take all of the prednisone. You  may use your additional pain medicine if needed. Followup with Dr. Felecia Shelling.   Chest Pain (Nonspecific) Chest pain has many causes. Your pain could be caused by something serious, such as a heart attack or a blood clot in the lungs. It could also be caused by something less serious, such as a chest bruise or a virus. Follow up with your doctor. More lab tests or other studies may be needed to find the cause of your pain. Most of the time, nonspecific chest pain will improve within 2 to 3 days of rest and mild pain medicine. HOME CARE  For chest bruises, you may put ice on the sore area for 15 to 20 minutes, 3 to 4 times a day. Do this only if it makes you or your child feel better.   Put ice in a plastic bag.   Place a towel between the skin and the bag.   Rest for the next 2 to 3 days.   Go back to work if the pain improves.   See your doctor if the pain lasts longer than 1 to 2 weeks.   Only take medicine as told by your doctor.   Quit smoking if you smoke.  GET HELP RIGHT AWAY IF:   There is more pain or pain that spreads to the arm, neck, jaw, back, or belly (abdomen).   You or your child has shortness of breath.   You or your child coughs more than usual or coughs up blood.   You or your child has very bad back or belly pain, feels sick to his or her stomach (nauseous), or throws up (vomits).   You or your child has very bad weakness.   You or your child passes out (faints).   You or your child has a temperature by mouth above 102 F (38.9 C), not controlled by medicine.  Any of these problems may be serious and may be an emergency. Do not wait to see if the problems will go away. Get medical help right away. Call your local emergency services 911 in [place] Do not drive yourself to the  hospital. MAKE SURE YOU:   Understand these instructions.   Will watch this condition.   Will get help right away if you or your child is not doing well or gets worse.  Document Released: 05/12/2008 Document Revised: 11/13/2011 Document Reviewed: 05/12/2008 [facility] Patient Information 2012 [place], [place].

## 2012-07-01 ENCOUNTER — Encounter: Payer: Self-pay | Admitting: Cardiology

## 2012-07-01 ENCOUNTER — Ambulatory Visit (INDEPENDENT_AMBULATORY_CARE_PROVIDER_SITE_OTHER): Payer: No Typology Code available for payment source | Admitting: Cardiology

## 2012-07-01 VITALS — BP 138/84 | HR 79 | Ht 67.0 in | Wt 158.0 lb

## 2012-07-01 DIAGNOSIS — C73 Malignant neoplasm of thyroid gland: Secondary | ICD-10-CM | POA: Insufficient documentation

## 2012-07-01 DIAGNOSIS — K219 Gastro-esophageal reflux disease without esophagitis: Secondary | ICD-10-CM | POA: Insufficient documentation

## 2012-07-01 DIAGNOSIS — R079 Chest pain, unspecified: Secondary | ICD-10-CM

## 2012-07-01 DIAGNOSIS — F4310 Post-traumatic stress disorder, unspecified: Secondary | ICD-10-CM

## 2012-07-01 DIAGNOSIS — N809 Endometriosis, unspecified: Secondary | ICD-10-CM | POA: Insufficient documentation

## 2012-07-01 DIAGNOSIS — E785 Hyperlipidemia, unspecified: Secondary | ICD-10-CM

## 2012-07-01 MED ORDER — NITROGLYCERIN 0.4 MG SL SUBL: 0.4000 mg | SUBLINGUAL_TABLET | SUBLINGUAL | Status: DC | PRN

## 2012-07-01 MED ORDER — PANTOPRAZOLE SODIUM 40 MG PO TBEC: 40.0000 mg | DELAYED_RELEASE_TABLET | Freq: Every day | ORAL | Status: DC
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
## 2012-07-01 NOTE — Assessment & Plan Note (Signed)
Moderate hyperlipidemia in the absence of known vascular disease.  Pharmacologic therapy is not necessarily warranted.

## 2012-07-01 NOTE — Assessment & Plan Note (Addendum)
Despite her history of PTSD, she appears well adjusted and calm.  She denies subjective nervousness, major life stress, sleep disorder, an eating disorder or other manifestations of anxiety.

## 2012-07-01 NOTE — Assessment & Plan Note (Addendum)
Chest discomfort is atypical, and the risk of any cardiac disease, especially coronary artery disease, is low.  An echocardiogram will be obtained to exclude structural heart disease, particularly a hypertrophic cardiomyopathy.   Empiric therapy with a PPI and sublingual nitroglycerin as needed will be instituted.  Patient will be reevaluated in 2-3 weeks to allow for testing to be completed and for a reasonable trial of current medical therapy.

## 2012-07-01 NOTE — Progress Notes (Deleted)
Name: Kristina Li    DOB: [birth year]  Age: 31 y.o.  MR#: [id number]       PCP:  Avon Gully, MD      Insurance: @PAYORNAME @   CC:    Chief Complaint  Patient presents with  . Chest pain    Pt states regular palpitations - Med List - TC    VS BP 138/84  Pulse 79  Ht 5\' 7"  (1.702 m)  Wt 158 lb (71.668 kg)  BMI 24.75 kg/m2  Weights Current Weight  07/01/12 158 lb (71.668 kg)  04/27/12 145 lb (65.772 kg)  04/20/12 145 lb (65.772 kg)    Blood Pressure  BP Readings from Last 3 Encounters:  07/01/12 138/84  04/27/12 151/91  04/20/12 150/95     Admit date:  (Not on file) Last encounter with RMR:  Visit date not found   Allergy Allergies  Allergen Reactions  . Aspirin Shortness Of Breath  . Ibuprofen Shortness Of Breath  . Latex Hives  . Shellfish Allergy Anaphylaxis  . Tape Rash    Plastic Tape, Thinning of skin    Current Outpatient Prescriptions  Medication Sig Dispense Refill  . estradiol (ESTRACE) 2 MG tablet Take 2 mg by mouth 2 (two) times daily as needed. FOR BLEEDING EPISODES      . levothyroxine (SYNTHROID, LEVOTHROID) 88 MCG tablet Take 88 mcg by mouth every morning.       . loratadine (CLARITIN) 10 MG tablet Take 10 mg by mouth daily as needed. FOR ALLERGIES      . medroxyPROGESTERone (DEPO-PROVERA) 150 MG/ML injection Inject 150 mg into the muscle every 3 (three) months.        . norethindrone (AYGESTIN) 5 MG tablet Take 5 mg by mouth every morning.       . traMADol (ULTRAM) 50 MG tablet Take 50 mg by mouth every 6 (six) hours as needed. For pain in hand        Discontinued Meds:   There are no discontinued medications.  Patient Active Problem List  Diagnosis  . Thyroid cancer  . Post traumatic stress disorder (PTSD)  . Chest pain  . Gastroesophageal reflux disease  . Endometriosis  . Laboratory test  . Hyperlipidemia    LABS Admission on 04/26/2012, Discharged on 04/27/2012  Component Date Value  . D-Dimer, Quant 04/27/2012 0.28     . Preg Test, Ur 04/27/2012 NEGATIVE   Admission on 04/20/2012, Discharged on 04/20/2012  Component Date Value  . WBC 04/20/2012 7.4   . RBC 04/20/2012 4.64   . Hemoglobin 04/20/2012 12.3   . HCT 04/20/2012 37.7   . MCV 04/20/2012 81.3   . [facility] 04/20/2012 26.5   . MCHC 04/20/2012 32.6   . RDW 04/20/2012 14.4   . Platelets 04/20/2012 247   . Neutrophils Relative 04/20/2012 66   . Neutro Abs 04/20/2012 4.9   . Lymphocytes Relative 04/20/2012 27   . Lymphs Abs 04/20/2012 2.0   . Monocytes Relative 04/20/2012 7   . Monocytes Absolute 04/20/2012 0.5   . Eosinophils Relative 04/20/2012 1   . Eosinophils Absolute 04/20/2012 0.0   . Basophils Relative 04/20/2012 0   . Basophils Absolute 04/20/2012 0.0   . Sodium 04/20/2012 135   . Potassium 04/20/2012 3.6   . Chloride 04/20/2012 102   . CO2 04/20/2012 22   . Glucose, Bld 04/20/2012 86   . BUN 04/20/2012 7   . Creatinine, Ser 04/20/2012 0.90   . Calcium 04/20/2012 9.2   .  Total Protein 04/20/2012 7.4   . Albumin 04/20/2012 3.7   . AST 04/20/2012 24   . ALT 04/20/2012 28   . Alkaline Phosphatase 04/20/2012 74   . Total Bilirubin 04/20/2012 0.2*  . GFR calc non Af Amer 04/20/2012 84*  . GFR calc Af Amer 04/20/2012 >90   . Troponin I 04/20/2012 <0.30   . Preg Test, Ur 04/20/2012 NEGATIVE      Results for this Opt Visit:     Results for orders placed during the hospital encounter of 04/26/12  D-DIMER, QUANTITATIVE      Component Value Range   D-Dimer, Quant 0.28  0.00 - 0.48 ug/mL-FEU  PREGNANCY, URINE      Component Value Range   Preg Test, Ur NEGATIVE  NEGATIVE    EKG Orders placed during the hospital encounter of 04/26/12  . EKG 12-LEAD  . EKG 12-LEAD  . EKG     Prior Assessment and Plan Problem List as of 07/01/2012            Cardiology Problems   Hyperlipidemia     Other   Thyroid cancer   Post traumatic stress disorder (PTSD)   Chest pain   Gastroesophageal reflux disease   Endometriosis   Laboratory  test       Imaging: No results found.   FRS Calculation: Score not calculated. Missing: Total Cholesterol, HDL

## 2012-07-01 NOTE — Patient Instructions (Addendum)
Your physician recommends that you schedule a follow-up appointment in: 2 -3 weeks  Your physician has recommended you make the following change in your medication:  1 - START Protonix 40 mg daily 2 - Nitroglycerin 0.4 mg tablet for chest pain - May take 1 tablet every 5 minutes x 3 for chest pain.  If no relief after 3rd dose, call 911.  Your physician has requested that you have an echocardiogram. Echocardiography is a painless test that uses sound waves to create images of your heart. It provides your doctor with information about the size and shape of your heart and how well your heart's chambers and valves are working. This procedure takes approximately one hour. There are no restrictions for this procedure.

## 2012-07-05 ENCOUNTER — Ambulatory Visit (HOSPITAL_COMMUNITY)
Admission: RE | Admit: 2012-07-05 | Discharge: 2012-07-05 | Disposition: A | Discharge status: Home / Self Care | Payer: Medicaid Other | Source: Ambulatory Visit | Attending: Cardiology | Admitting: Cardiology

## 2012-07-05 DIAGNOSIS — R0609 Other forms of dyspnea: Secondary | ICD-10-CM | POA: Insufficient documentation

## 2012-07-05 DIAGNOSIS — R079 Chest pain, unspecified: Secondary | ICD-10-CM | POA: Insufficient documentation

## 2012-07-05 DIAGNOSIS — R0989 Other specified symptoms and signs involving the circulatory and respiratory systems: Secondary | ICD-10-CM | POA: Insufficient documentation

## 2012-07-05 DIAGNOSIS — E785 Hyperlipidemia, unspecified: Secondary | ICD-10-CM | POA: Insufficient documentation

## 2012-07-05 DIAGNOSIS — R072 Precordial pain: Secondary | ICD-10-CM

## 2012-07-05 NOTE — Progress Notes (Signed)
*  PRELIMINARY RESULTS* Echocardiogram 2D Echocardiogram has been performed.  Kristina Li 07/05/2012, 1:14 PM

## 2012-07-23 ENCOUNTER — Ambulatory Visit: Payer: No Typology Code available for payment source | Admitting: Adult Health

## 2012-08-08 ENCOUNTER — Encounter (HOSPITAL_COMMUNITY): Payer: Self-pay

## 2012-08-08 ENCOUNTER — Emergency Department (HOSPITAL_COMMUNITY)
Admission: EM | Admit: 2012-08-08 | Discharge: 2012-08-08 | Disposition: A | Discharge status: Home / Self Care | Payer: Medicaid Other | Attending: Emergency Medicine | Admitting: Emergency Medicine

## 2012-08-08 DIAGNOSIS — Z886 Allergy status to analgesic agent status: Secondary | ICD-10-CM | POA: Insufficient documentation

## 2012-08-08 DIAGNOSIS — K219 Gastro-esophageal reflux disease without esophagitis: Secondary | ICD-10-CM | POA: Insufficient documentation

## 2012-08-08 DIAGNOSIS — W4909XA Other specified item causing external constriction, initial encounter: Secondary | ICD-10-CM | POA: Insufficient documentation

## 2012-08-08 DIAGNOSIS — S60949A Unspecified superficial injury of unspecified finger, initial encounter: Secondary | ICD-10-CM | POA: Insufficient documentation

## 2012-08-08 DIAGNOSIS — F411 Generalized anxiety disorder: Secondary | ICD-10-CM | POA: Insufficient documentation

## 2012-08-08 DIAGNOSIS — Z8742 Personal history of other diseases of the female genital tract: Secondary | ICD-10-CM | POA: Insufficient documentation

## 2012-08-08 DIAGNOSIS — Z91013 Allergy to seafood: Secondary | ICD-10-CM | POA: Insufficient documentation

## 2012-08-08 DIAGNOSIS — Z85850 Personal history of malignant neoplasm of thyroid: Secondary | ICD-10-CM | POA: Insufficient documentation

## 2012-08-08 DIAGNOSIS — S60441A External constriction of left index finger, initial encounter: Secondary | ICD-10-CM
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
## 2012-08-08 NOTE — ED Notes (Signed)
Pt finger slightly decreased in swelling from ice pack.

## 2012-08-08 NOTE — ED Notes (Signed)
Silver coated ring with yellow stone was cut off pt finger by PA Ivery Quale. Pt was discharged and left ring on the counter. Secretary called the phone number pt had given upon arrival. Phone number [phone] given was incorrect. Ring was placed in a biohazard bag with pt name and given to security so it could be locked up. RN, Alfredo Martinez made aware.

## 2012-08-08 NOTE — ED Notes (Signed)
Pt went to bed with ring on lt index finger and woke up to am and unable to get ring off. Fingers are swollen and ring is tight around finger. Swelling present. Finger red in appearance. Cap refill present to hand.

## 2012-08-08 NOTE — ED Notes (Signed)
Ivery Quale PA went in to remove ring with ring cutter. Ice pack given for finger swelling. Skin intact where ring was.

## 2012-08-09 NOTE — ED Provider Notes (Signed)
Medical screening examination/treatment/procedure(s) were performed by non-physician practitioner and as supervising physician I was immediately available for consultation/collaboration.  [name], MD 08/09/12 [phone]

## 2013-02-12 ENCOUNTER — Emergency Department (HOSPITAL_COMMUNITY)
Admission: EM | Admit: 2013-02-12 | Discharge: 2013-02-13 | Disposition: A | Discharge status: Home / Self Care | Payer: Medicaid Other | Attending: Emergency Medicine | Admitting: Emergency Medicine

## 2013-02-12 ENCOUNTER — Encounter (HOSPITAL_COMMUNITY): Payer: Self-pay | Admitting: *Deleted

## 2013-02-12 DIAGNOSIS — H5789 Other specified disorders of eye and adnexa: Secondary | ICD-10-CM | POA: Insufficient documentation

## 2013-02-12 DIAGNOSIS — R51 Headache: Secondary | ICD-10-CM | POA: Insufficient documentation

## 2013-02-12 DIAGNOSIS — N760 Acute vaginitis: Secondary | ICD-10-CM | POA: Insufficient documentation

## 2013-02-12 DIAGNOSIS — Z3202 Encounter for pregnancy test, result negative: Secondary | ICD-10-CM | POA: Insufficient documentation

## 2013-02-12 DIAGNOSIS — N898 Other specified noninflammatory disorders of vagina: Secondary | ICD-10-CM

## 2013-02-12 DIAGNOSIS — Z9889 Other specified postprocedural states: Secondary | ICD-10-CM | POA: Insufficient documentation

## 2013-02-12 DIAGNOSIS — C73 Malignant neoplasm of thyroid gland: Secondary | ICD-10-CM | POA: Insufficient documentation

## 2013-02-12 DIAGNOSIS — R300 Dysuria: Secondary | ICD-10-CM

## 2013-02-12 DIAGNOSIS — R079 Chest pain, unspecified: Secondary | ICD-10-CM | POA: Insufficient documentation

## 2013-02-12 DIAGNOSIS — B9689 Other specified bacterial agents as the cause of diseases classified elsewhere: Secondary | ICD-10-CM

## 2013-02-12 DIAGNOSIS — R6883 Chills (without fever): Secondary | ICD-10-CM | POA: Insufficient documentation

## 2013-02-12 DIAGNOSIS — Z923 Personal history of irradiation: Secondary | ICD-10-CM | POA: Insufficient documentation

## 2013-02-12 DIAGNOSIS — Z8659 Personal history of other mental and behavioral disorders: Secondary | ICD-10-CM | POA: Insufficient documentation

## 2013-02-12 DIAGNOSIS — R109 Unspecified abdominal pain: Secondary | ICD-10-CM | POA: Insufficient documentation

## 2013-02-12 DIAGNOSIS — R0602 Shortness of breath: Secondary | ICD-10-CM | POA: Insufficient documentation

## 2013-02-12 DIAGNOSIS — N809 Endometriosis, unspecified: Secondary | ICD-10-CM | POA: Insufficient documentation

## 2013-02-12 DIAGNOSIS — N390 Urinary tract infection, site not specified: Secondary | ICD-10-CM

## 2013-02-12 DIAGNOSIS — K219 Gastro-esophageal reflux disease without esophagitis: Secondary | ICD-10-CM | POA: Insufficient documentation

## 2013-02-12 DIAGNOSIS — Z79899 Other long term (current) drug therapy: Secondary | ICD-10-CM | POA: Insufficient documentation

## 2013-02-12 LAB — CBC WITH DIFFERENTIAL/PLATELET
Basophils Absolute: 0 10*3/uL (ref 0.0–0.1)
Eosinophils Relative: 0 % (ref 0–5)
Lymphocytes Relative: 15 % (ref 12–46)
MCV: 85.3 fL (ref 78.0–100.0)
Neutro Abs: 8.5 10*3/uL — ABNORMAL HIGH (ref 1.7–7.7)
Neutrophils Relative %: 78 % — ABNORMAL HIGH (ref 43–77)
Platelets: 290 10*3/uL (ref 150–400)
RBC: 4.9 MIL/uL (ref 3.87–5.11)
RDW: 14.5 % (ref 11.5–15.5)
WBC: 10.9 10*3/uL — ABNORMAL HIGH (ref 4.0–10.5)

## 2013-02-12 LAB — URINALYSIS, ROUTINE W REFLEX MICROSCOPIC
Glucose, UA: NEGATIVE mg/dL
Protein, ur: NEGATIVE mg/dL
Specific Gravity, Urine: 1.015 (ref 1.005–1.030)
Urobilinogen, UA: 0.2 mg/dL (ref 0.0–1.0)

## 2013-02-12 LAB — BASIC METABOLIC PANEL
CO2: 25 mEq/L (ref 19–32)
Calcium: 10 mg/dL (ref 8.4–10.5)
Potassium: 3.4 mEq/L — ABNORMAL LOW (ref 3.5–5.1)
Sodium: 138 mEq/L (ref 135–145)

## 2013-02-12 LAB — URINE MICROSCOPIC-ADD ON

## 2013-02-12 LAB — PREGNANCY, URINE: Preg Test, Ur: NEGATIVE

## 2013-02-12 MED ORDER — AZITHROMYCIN 250 MG PO TABS
1000.0000 mg | ORAL_TABLET | Freq: Once | ORAL | Status: AC
  Administered 2013-02-13: 1000 mg via ORAL
  Filled 2013-02-12: qty 4

## 2013-02-12 MED ORDER — CEFTRIAXONE SODIUM 250 MG IJ SOLR
250.0000 mg | Freq: Once | INTRAMUSCULAR | Status: AC
  Administered 2013-02-13: 250 mg via INTRAMUSCULAR
  Filled 2013-02-12: qty 250

## 2013-02-12 MED ORDER — CIPROFLOXACIN HCL 500 MG PO TABS: 500.0000 mg | ORAL_TABLET | Freq: Two times a day (BID) | ORAL | Status: DC
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
## 2013-02-12 NOTE — ED Notes (Signed)
Pt presents to er with c/o yellow vaginal discharge, urinary retention, pt states that she noticed the symptoms yesterday,

## 2013-02-13 MED ORDER — METRONIDAZOLE 500 MG PO TABS: 500.0000 mg | ORAL_TABLET | Freq: Two times a day (BID) | ORAL | Status: DC

## 2013-02-14 LAB — URINE CULTURE: Colony Count: NO GROWTH

## 2013-02-15 LAB — GC/CHLAMYDIA PROBE AMP: CT Probe RNA: NEGATIVE

## 2013-02-21 ENCOUNTER — Other Ambulatory Visit (HOSPITAL_COMMUNITY): Payer: Self-pay | Admitting: "Endocrinology

## 2013-02-22 ENCOUNTER — Other Ambulatory Visit (HOSPITAL_COMMUNITY): Payer: Self-pay | Admitting: Internal Medicine

## 2013-02-22 ENCOUNTER — Ambulatory Visit (HOSPITAL_COMMUNITY): Payer: Medicaid Other

## 2013-02-22 ENCOUNTER — Ambulatory Visit (HOSPITAL_COMMUNITY)
Admission: RE | Admit: 2013-02-22 | Discharge: 2013-02-22 | Disposition: A | Discharge status: Home / Self Care | Payer: Medicaid Other | Source: Ambulatory Visit | Attending: Internal Medicine | Admitting: Internal Medicine

## 2013-02-22 DIAGNOSIS — C73 Malignant neoplasm of thyroid gland: Secondary | ICD-10-CM

## 2013-02-22 DIAGNOSIS — Z9889 Other specified postprocedural states: Secondary | ICD-10-CM | POA: Insufficient documentation

## 2013-02-23 ENCOUNTER — Ambulatory Visit (INDEPENDENT_AMBULATORY_CARE_PROVIDER_SITE_OTHER): Payer: Medicaid Other | Admitting: Obstetrics and Gynecology

## 2013-02-23 ENCOUNTER — Encounter: Payer: Self-pay | Admitting: Obstetrics and Gynecology

## 2013-02-23 VITALS — BP 122/90 | Resp 14 | Wt 140.2 lb

## 2013-02-23 LAB — POCT URINALYSIS DIPSTICK: Glucose, UA: NEGATIVE

## 2013-02-23 NOTE — Progress Notes (Signed)
Subjective:     Kristina Li is a 32 year old female gravida 1 para 0 or gravida 0 para 0 with a questionable history of left ectopic pregnancy managed by Dr. Lisette Grinder many years ago..  Personal health questionnaire reviewed: yes.  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 Gynecologic History Patient's last menstrual period was 02/05/2013. Contraception: none previous use of Depo-Provera until late 2013 Last Pap: None recently. Results were:  Last mammogram: . Results were:   Obstetric History OB History   Grav Para Term Preterm Abortions TAB SAB Ect Mult Living                   History of thyroid cancer removedat [facility] by Dr Sherrlyn Hock  Review of Systems Pertinent items are noted in HPI.    Objective:    No exam performed today, patient refused exam.   Today's visit was to discuss her desire for pregnancy and review the ultrasound report from last week. He was 02/05/2013 and showed no ultrasound last week which showed a normal uterus thin endometrium normal left and right ovaries. There was no free fluid and no masses At this time she finally except she is pregnant at this time Assessment:    Healthy female exam.  History of pseudocyesis  Possible history of left ectopic pregnancy Desire for future pregnancy Plan:    The patient is currently not in a relationship and that actually attempting to get pregnant we've asked her to delay infertility evaluations until her situation is stable once she is in a relationship her pregnancy is being considered she may contact her office for examination and consideration of HSG

## 2013-02-23 NOTE — Patient Instructions (Signed)
Return when you and partner are actively desiring pregnancy, for our office to perform tests to see if you can get pregnant.

## 2013-03-04 ENCOUNTER — Emergency Department (HOSPITAL_COMMUNITY)
Admission: EM | Admit: 2013-03-04 | Discharge: 2013-03-04 | Disposition: A | Discharge status: Home / Self Care | Payer: Medicaid Other | Attending: Emergency Medicine | Admitting: Emergency Medicine

## 2013-03-04 ENCOUNTER — Encounter (HOSPITAL_COMMUNITY): Payer: Self-pay | Admitting: *Deleted

## 2013-03-04 DIAGNOSIS — Z8719 Personal history of other diseases of the digestive system: Secondary | ICD-10-CM | POA: Insufficient documentation

## 2013-03-04 DIAGNOSIS — Z8742 Personal history of other diseases of the female genital tract: Secondary | ICD-10-CM | POA: Insufficient documentation

## 2013-03-04 DIAGNOSIS — R21 Rash and other nonspecific skin eruption: Secondary | ICD-10-CM | POA: Insufficient documentation

## 2013-03-04 DIAGNOSIS — Z923 Personal history of irradiation: Secondary | ICD-10-CM | POA: Insufficient documentation

## 2013-03-04 DIAGNOSIS — R079 Chest pain, unspecified: Secondary | ICD-10-CM | POA: Insufficient documentation

## 2013-03-04 DIAGNOSIS — Z85850 Personal history of malignant neoplasm of thyroid: Secondary | ICD-10-CM | POA: Insufficient documentation

## 2013-03-04 DIAGNOSIS — Z8659 Personal history of other mental and behavioral disorders: Secondary | ICD-10-CM | POA: Insufficient documentation

## 2013-03-04 DIAGNOSIS — F411 Generalized anxiety disorder: Secondary | ICD-10-CM | POA: Insufficient documentation

## 2013-03-04 DIAGNOSIS — Z79899 Other long term (current) drug therapy: Secondary | ICD-10-CM | POA: Insufficient documentation

## 2013-03-04 MED ORDER — PREDNISOLONE SODIUM PHOSPHATE 15 MG/5ML PO SOLN
30.0000 mg | Freq: Once | ORAL | Status: AC
  Administered 2013-03-04: 30 mg via ORAL
  Filled 2013-03-04: qty 10

## 2013-03-04 MED ORDER — PREDNISOLONE SODIUM PHOSPHATE 15 MG/5ML PO SOLN
ORAL | Status: AC
  Filled 2013-03-04: qty 5

## 2013-03-04 MED ORDER — DIPHENHYDRAMINE HCL 12.5 MG/5ML PO SYRP: ORAL_SOLUTION | ORAL | Status: DC

## 2013-03-04 MED ORDER — PERMETHRIN 5 % EX CREA: TOPICAL_CREAM | CUTANEOUS | Status: DC

## 2013-03-04 MED ORDER — DIPHENHYDRAMINE HCL 12.5 MG/5ML PO ELIX
25.0000 mg | ORAL_SOLUTION | Freq: Once | ORAL | Status: AC
  Administered 2013-03-04: 25 mg via ORAL
  Filled 2013-03-04: qty 5

## 2013-03-04 MED ORDER — PREDNISOLONE SODIUM PHOSPHATE 15 MG/5ML PO SOLN: 30.0000 mg | Freq: Every day | ORAL | Status: AC

## 2013-03-04 NOTE — ED Notes (Signed)
Itching sensation , thinks she may have bedbugs, x 1 year.
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
## 2013-03-05 NOTE — ED Provider Notes (Signed)
Medical screening examination/treatment/procedure(s) were performed by non-physician practitioner and as supervising physician I was immediately available for consultation/collaboration.   Carleene Cooper III, MD 03/05/13 [phone]

## 2013-03-10 NOTE — ED Provider Notes (Signed)
Medical screening examination/treatment/procedure(s) were performed by non-physician practitioner and as supervising physician I was immediately available for consultation/collaboration.   Carleene Cooper III, MD 03/10/13 2036

## 2013-04-03 ENCOUNTER — Encounter (HOSPITAL_COMMUNITY): Payer: Self-pay | Admitting: *Deleted

## 2013-04-03 ENCOUNTER — Emergency Department (HOSPITAL_COMMUNITY)
Admission: EM | Admit: 2013-04-03 | Discharge: 2013-04-03 | Disposition: A | Discharge status: Home / Self Care | Payer: Medicaid Other | Attending: Emergency Medicine | Admitting: Emergency Medicine

## 2013-04-03 DIAGNOSIS — R599 Enlarged lymph nodes, unspecified: Secondary | ICD-10-CM | POA: Insufficient documentation

## 2013-04-03 DIAGNOSIS — Z760 Encounter for issue of repeat prescription: Secondary | ICD-10-CM | POA: Insufficient documentation

## 2013-04-03 DIAGNOSIS — Z91040 Latex allergy status: Secondary | ICD-10-CM | POA: Insufficient documentation

## 2013-04-03 DIAGNOSIS — J3489 Other specified disorders of nose and nasal sinuses: Secondary | ICD-10-CM | POA: Insufficient documentation

## 2013-04-03 DIAGNOSIS — Z79899 Other long term (current) drug therapy: Secondary | ICD-10-CM | POA: Insufficient documentation

## 2013-04-03 DIAGNOSIS — J029 Acute pharyngitis, unspecified: Secondary | ICD-10-CM | POA: Insufficient documentation

## 2013-04-03 DIAGNOSIS — Z923 Personal history of irradiation: Secondary | ICD-10-CM | POA: Insufficient documentation

## 2013-04-03 DIAGNOSIS — R059 Cough, unspecified: Secondary | ICD-10-CM | POA: Insufficient documentation

## 2013-04-03 DIAGNOSIS — R05 Cough: Secondary | ICD-10-CM | POA: Insufficient documentation

## 2013-04-03 DIAGNOSIS — Z8719 Personal history of other diseases of the digestive system: Secondary | ICD-10-CM | POA: Insufficient documentation

## 2013-04-03 DIAGNOSIS — R062 Wheezing: Secondary | ICD-10-CM | POA: Insufficient documentation

## 2013-04-03 DIAGNOSIS — R197 Diarrhea, unspecified: Secondary | ICD-10-CM | POA: Insufficient documentation

## 2013-04-03 DIAGNOSIS — Z8659 Personal history of other mental and behavioral disorders: Secondary | ICD-10-CM | POA: Insufficient documentation

## 2013-04-03 DIAGNOSIS — Z3202 Encounter for pregnancy test, result negative: Secondary | ICD-10-CM | POA: Insufficient documentation

## 2013-04-03 DIAGNOSIS — Z8742 Personal history of other diseases of the female genital tract: Secondary | ICD-10-CM | POA: Insufficient documentation

## 2013-04-03 DIAGNOSIS — Z85850 Personal history of malignant neoplasm of thyroid: Secondary | ICD-10-CM | POA: Insufficient documentation

## 2013-04-03 LAB — CBC WITH DIFFERENTIAL/PLATELET
Eosinophils Absolute: 0.1 10*3/uL (ref 0.0–0.7)
Eosinophils Relative: 1 % (ref 0–5)
HCT: 39.8 % (ref 36.0–46.0)
Hemoglobin: 13.5 g/dL (ref 12.0–15.0)
Lymphs Abs: 1.4 10*3/uL (ref 0.7–4.0)
MCH: 29.4 pg (ref 26.0–34.0)
MCV: 86.7 fL (ref 78.0–100.0)
Monocytes Absolute: 0.4 10*3/uL (ref 0.1–1.0)
Monocytes Relative: 7 % (ref 3–12)
RBC: 4.59 MIL/uL (ref 3.87–5.11)

## 2013-04-03 LAB — COMPREHENSIVE METABOLIC PANEL
Alkaline Phosphatase: 74 U/L (ref 39–117)
BUN: 10 mg/dL (ref 6–23)
Calcium: 9.3 mg/dL (ref 8.4–10.5)
Creatinine, Ser: 0.92 mg/dL (ref 0.50–1.10)
GFR calc Af Amer: >90 mL/min (ref >90)
Glucose, Bld: 88 mg/dL (ref 70–99)
Potassium: 3.4 mEq/L — ABNORMAL LOW (ref 3.5–5.1)
Total Protein: 8 g/dL (ref 6.0–8.3)

## 2013-04-03 LAB — URINALYSIS, ROUTINE W REFLEX MICROSCOPIC
Ketones, ur: NEGATIVE mg/dL
Leukocytes, UA: NEGATIVE
Nitrite: NEGATIVE
pH: 6 (ref 5.0–8.0)

## 2013-04-03 MED ORDER — ALBUTEROL SULFATE HFA 108 (90 BASE) MCG/ACT IN AERS: 1.0000 | INHALATION_SPRAY | Freq: Four times a day (QID) | RESPIRATORY_TRACT | Status: DC | PRN

## 2013-04-03 MED ORDER — SODIUM CHLORIDE 0.9 % IV SOLN
Freq: Once | INTRAVENOUS | Status: AC
  Administered 2013-04-03: 1000 mL via INTRAVENOUS

## 2013-04-03 MED ORDER — MAGIC MOUTHWASH W/LIDOCAINE: ORAL | Status: DC
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
## 2013-04-03 NOTE — ED Notes (Signed)
Pt c/o swollen lymph nodes with knot on left side of throat that started yesterday, loose, soft stools for awhile, abd pain, nausea, sob at times during the night, states "I have lost my inhaler",

## 2013-04-06 NOTE — ED Provider Notes (Signed)
Medical screening examination/treatment/procedure(s) were performed by non-physician practitioner and as supervising physician I was immediately available for consultation/collaboration.   Axyl Sitzman W. Shawna Wearing, MD 04/06/13 1555 

## 2013-04-25 ENCOUNTER — Emergency Department: Payer: Self-pay | Admitting: Emergency Medicine

## 2013-04-25 LAB — URINALYSIS, COMPLETE
Bilirubin,UR: NEGATIVE
Ketone: NEGATIVE
Ph: 6 (ref 4.5–8.0)
Protein: NEGATIVE
RBC,UR: 1 /HPF (ref 0–5)

## 2013-04-25 LAB — CBC
HGB: 13.3 g/dL (ref 12.0–16.0)
MCH: 29 pg (ref 26.0–34.0)
Platelet: 213 10*3/uL (ref 150–440)
RDW: 14.7 % — ABNORMAL HIGH (ref 11.5–14.5)

## 2013-04-25 LAB — DRUG SCREEN, URINE
Amphetamines, Ur Screen: NEGATIVE (ref ?–1000)
Cannabinoid 50 Ng, Ur ~~LOC~~: NEGATIVE (ref ?–50)
Cocaine Metabolite,Ur ~~LOC~~: NEGATIVE (ref ?–300)
MDMA (Ecstasy)Ur Screen: NEGATIVE (ref ?–500)
Phencyclidine (PCP) Ur S: NEGATIVE (ref ?–25)
Tricyclic, Ur Screen: NEGATIVE (ref ?–1000)

## 2013-04-25 LAB — COMPREHENSIVE METABOLIC PANEL
Chloride: 107 mmol/L (ref 98–107)
Co2: 24 mmol/L (ref 21–32)
Creatinine: 0.92 mg/dL (ref 0.60–1.30)
EGFR (African American): >60
Osmolality: 275 (ref 275–301)
SGOT(AST): 21 U/L (ref 15–37)

## 2013-04-25 LAB — TSH: Thyroid Stimulating Horm: 0.04 u[IU]/mL — ABNORMAL LOW

## 2013-04-25 LAB — ETHANOL
Ethanol %: <0.003 % (ref 0.000–0.080)
Ethanol: <3 mg/dL

## 2013-07-10 ENCOUNTER — Emergency Department: Payer: Self-pay | Admitting: Emergency Medicine

## 2013-07-18 ENCOUNTER — Ambulatory Visit: Payer: Self-pay | Admitting: Internal Medicine

## 2013-07-20 LAB — HEPATIC FUNCTION PANEL A (ARMC)
Albumin: 4.2 g/dL (ref 3.4–5.0)
Alkaline Phosphatase: 65 U/L (ref 50–136)
Bilirubin, Direct: <0.1 mg/dL (ref 0.00–0.20)
Bilirubin,Total: 0.3 mg/dL (ref 0.2–1.0)

## 2013-07-20 LAB — CBC CANCER CENTER
Basophil #: 0 x10 3/mm (ref 0.0–0.1)
Eosinophil #: 0.1 x10 3/mm (ref 0.0–0.7)
Eosinophil %: 0.7 %
Lymphocyte #: 2.2 x10 3/mm (ref 1.0–3.6)
Lymphocyte %: 27.5 %
MCHC: 33.1 g/dL (ref 32.0–36.0)
Monocyte #: 0.5 x10 3/mm (ref 0.2–0.9)
Neutrophil %: 65.1 %
Platelet: 238 x10 3/mm (ref 150–440)
RBC: 4.59 10*6/uL (ref 3.80–5.20)
RDW: 14.3 % (ref 11.5–14.5)
WBC: 8 x10 3/mm (ref 3.6–11.0)

## 2013-07-20 LAB — CREATININE, SERUM: EGFR (Non-African Amer.): >60

## 2013-08-05 ENCOUNTER — Ambulatory Visit: Payer: Self-pay | Admitting: Gastroenterology

## 2013-08-08 ENCOUNTER — Ambulatory Visit: Payer: Self-pay | Admitting: Internal Medicine

## 2013-08-09 LAB — PATHOLOGY REPORT

## 2013-08-18 ENCOUNTER — Emergency Department: Payer: Self-pay | Admitting: Emergency Medicine

## 2013-08-18 LAB — COMPREHENSIVE METABOLIC PANEL
Albumin: 4.2 g/dL (ref 3.4–5.0)
BUN: 10 mg/dL (ref 7–18)
Bilirubin,Total: 0.5 mg/dL (ref 0.2–1.0)
Calcium, Total: 9.5 mg/dL (ref 8.5–10.1)
Co2: 27 mmol/L (ref 21–32)
Creatinine: 1.01 mg/dL (ref 0.60–1.30)
Glucose: 76 mg/dL (ref 65–99)
Sodium: 139 mmol/L (ref 136–145)
Total Protein: 8.1 g/dL (ref 6.4–8.2)

## 2013-08-18 LAB — CBC
HGB: 14.3 g/dL (ref 12.0–16.0)
MCHC: 33.9 g/dL (ref 32.0–36.0)
MCV: 90 fL (ref 80–100)
Platelet: 246 10*3/uL (ref 150–440)
WBC: 6.7 10*3/uL (ref 3.6–11.0)

## 2013-08-18 LAB — TROPONIN I
Troponin-I: <0.02 ng/mL
Troponin-I: <0.02 ng/mL

## 2013-08-18 LAB — PROTIME-INR
INR: 0.9
Prothrombin Time: 12.3 secs (ref 11.5–14.7)

## 2013-08-26 ENCOUNTER — Ambulatory Visit: Payer: Self-pay | Admitting: Gastroenterology

## 2013-09-01 ENCOUNTER — Encounter: Payer: Self-pay | Admitting: *Deleted

## 2013-09-08 DIAGNOSIS — J45909 Unspecified asthma, uncomplicated: Secondary | ICD-10-CM | POA: Insufficient documentation

## 2013-09-08 DIAGNOSIS — E039 Hypothyroidism, unspecified: Secondary | ICD-10-CM | POA: Insufficient documentation

## 2013-10-26 ENCOUNTER — Ambulatory Visit: Payer: Self-pay | Admitting: Gastroenterology

## 2013-10-26 ENCOUNTER — Emergency Department: Payer: Self-pay | Admitting: Emergency Medicine

## 2013-10-26 LAB — COMPREHENSIVE METABOLIC PANEL
Anion Gap: 5 — ABNORMAL LOW (ref 7–16)
BUN: 8 mg/dL (ref 7–18)
Calcium, Total: 8.9 mg/dL (ref 8.5–10.1)
Chloride: 106 mmol/L (ref 98–107)
Co2: 27 mmol/L (ref 21–32)
EGFR (Non-African Amer.): >60
Glucose: 86 mg/dL (ref 65–99)
Osmolality: 273 (ref 275–301)
Potassium: 3.9 mmol/L (ref 3.5–5.1)
SGPT (ALT): 22 U/L (ref 12–78)
Total Protein: 7.9 g/dL (ref 6.4–8.2)

## 2013-10-26 LAB — URINALYSIS, COMPLETE
Blood: NEGATIVE
Glucose,UR: NEGATIVE mg/dL (ref 0–75)
Ketone: NEGATIVE
Nitrite: NEGATIVE
Ph: 6 (ref 4.5–8.0)
RBC,UR: 2 /HPF (ref 0–5)
Specific Gravity: 1.008 (ref 1.003–1.030)
WBC UR: 1 /HPF (ref 0–5)

## 2013-10-26 LAB — CBC
HGB: 13.7 g/dL (ref 12.0–16.0)
MCH: 30 pg (ref 26.0–34.0)
Platelet: 231 10*3/uL (ref 150–440)
RBC: 4.59 10*6/uL (ref 3.80–5.20)
RDW: 13.5 % (ref 11.5–14.5)

## 2013-10-26 LAB — GC/CHLAMYDIA PROBE AMP

## 2013-10-26 LAB — HCG, QUANTITATIVE, PREGNANCY: Beta Hcg, Quant.: <1 m[IU]/mL — ABNORMAL LOW

## 2013-10-27 ENCOUNTER — Ambulatory Visit: Payer: Self-pay | Admitting: Gastroenterology

## 2013-10-30 ENCOUNTER — Emergency Department: Payer: Self-pay | Admitting: Emergency Medicine

## 2013-10-30 LAB — CBC WITH DIFFERENTIAL/PLATELET
Basophil %: 0.3 %
Eosinophil %: 0.5 %
HCT: 40.2 % (ref 35.0–47.0)
Lymphocyte #: 1.8 10*3/uL (ref 1.0–3.6)
Lymphocyte %: 23.2 %
MCV: 91 fL (ref 80–100)
Monocyte #: 0.7 x10 3/mm (ref 0.2–0.9)
Monocyte %: 9 %
Neutrophil #: 5.3 10*3/uL (ref 1.4–6.5)
Platelet: 233 10*3/uL (ref 150–440)
RBC: 4.41 10*6/uL (ref 3.80–5.20)
RDW: 13.6 % (ref 11.5–14.5)
WBC: 7.9 10*3/uL (ref 3.6–11.0)

## 2013-10-30 LAB — COMPREHENSIVE METABOLIC PANEL
Alkaline Phosphatase: 64 U/L
Anion Gap: 4 — ABNORMAL LOW (ref 7–16)
BUN: 8 mg/dL (ref 7–18)
Chloride: 107 mmol/L (ref 98–107)
EGFR (Non-African Amer.): >60
Glucose: 86 mg/dL (ref 65–99)
SGOT(AST): 25 U/L (ref 15–37)

## 2013-10-30 LAB — LIPASE, BLOOD: Lipase: 130 U/L (ref 73–393)

## 2013-11-23 ENCOUNTER — Ambulatory Visit: Payer: Self-pay | Admitting: Surgery

## 2013-11-27 ENCOUNTER — Emergency Department: Payer: Self-pay | Admitting: Emergency Medicine

## 2013-12-19 DIAGNOSIS — R002 Palpitations: Secondary | ICD-10-CM | POA: Insufficient documentation

## 2013-12-19 DIAGNOSIS — R0609 Other forms of dyspnea: Secondary | ICD-10-CM | POA: Insufficient documentation

## 2013-12-19 DIAGNOSIS — I340 Nonrheumatic mitral (valve) insufficiency: Secondary | ICD-10-CM | POA: Insufficient documentation

## 2013-12-19 DIAGNOSIS — R0989 Other specified symptoms and signs involving the circulatory and respiratory systems: Secondary | ICD-10-CM | POA: Insufficient documentation

## 2014-02-05 ENCOUNTER — Emergency Department: Payer: Self-pay | Admitting: Emergency Medicine

## 2014-02-05 LAB — RAPID INFLUENZA A&B ANTIGENS

## 2014-04-10 ENCOUNTER — Emergency Department: Payer: Self-pay | Admitting: Emergency Medicine

## 2014-05-04 ENCOUNTER — Emergency Department: Payer: Self-pay | Admitting: Emergency Medicine

## 2014-05-04 LAB — COMPREHENSIVE METABOLIC PANEL
ALBUMIN: 4.2 g/dL (ref 3.4–5.0)
ALT: 23 U/L (ref 12–78)
ANION GAP: 3 — AB (ref 7–16)
Alkaline Phosphatase: 62 U/L
BUN: 10 mg/dL (ref 7–18)
Bilirubin,Total: 0.4 mg/dL (ref 0.2–1.0)
CALCIUM: 8.9 mg/dL (ref 8.5–10.1)
CHLORIDE: 107 mmol/L (ref 98–107)
CREATININE: 0.99 mg/dL (ref 0.60–1.30)
Co2: 27 mmol/L (ref 21–32)
EGFR (Non-African Amer.): >60
Glucose: 89 mg/dL (ref 65–99)
Osmolality: 272 (ref 275–301)
POTASSIUM: 3.6 mmol/L (ref 3.5–5.1)
SGOT(AST): 27 U/L (ref 15–37)
Sodium: 137 mmol/L (ref 136–145)
Total Protein: 7.6 g/dL (ref 6.4–8.2)

## 2014-05-04 LAB — URINALYSIS, COMPLETE
BILIRUBIN, UR: NEGATIVE
Bacteria: NONE SEEN
Blood: NEGATIVE
Glucose,UR: NEGATIVE mg/dL (ref 0–75)
Ketone: NEGATIVE
LEUKOCYTE ESTERASE: NEGATIVE
NITRITE: NEGATIVE
PH: 6 (ref 4.5–8.0)
PROTEIN: NEGATIVE
Specific Gravity: 1.02 (ref 1.003–1.030)
WBC UR: 2 /HPF (ref 0–5)

## 2014-05-04 LAB — CBC WITH DIFFERENTIAL/PLATELET
BASOS ABS: 0.1 10*3/uL (ref 0.0–0.1)
BASOS PCT: 1.1 %
EOS ABS: 0 10*3/uL (ref 0.0–0.7)
Eosinophil %: 0.4 %
HCT: 42.2 % (ref 35.0–47.0)
HGB: 13.3 g/dL (ref 12.0–16.0)
LYMPHS ABS: 1.7 10*3/uL (ref 1.0–3.6)
Lymphocyte %: 25.4 %
MCH: 28.6 pg (ref 26.0–34.0)
MCHC: 31.5 g/dL — ABNORMAL LOW (ref 32.0–36.0)
MCV: 91 fL (ref 80–100)
MONO ABS: 0.4 x10 3/mm (ref 0.2–0.9)
Monocyte %: 6.5 %
Neutrophil #: 4.5 10*3/uL (ref 1.4–6.5)
Neutrophil %: 66.6 %
Platelet: 209 10*3/uL (ref 150–440)
RBC: 4.64 10*6/uL (ref 3.80–5.20)
RDW: 12.7 % (ref 11.5–14.5)
WBC: 6.8 10*3/uL (ref 3.6–11.0)

## 2014-06-26 ENCOUNTER — Other Ambulatory Visit: Payer: Self-pay | Admitting: Cardiology

## 2014-06-28 ENCOUNTER — Emergency Department: Payer: Self-pay | Admitting: Emergency Medicine

## 2014-07-19 ENCOUNTER — Ambulatory Visit: Payer: Self-pay | Admitting: Internal Medicine

## 2014-07-19 LAB — CBC CANCER CENTER
BASOS ABS: 0.1 x10 3/mm (ref 0.0–0.1)
BASOS PCT: 0.9 %
Eosinophil #: 0 x10 3/mm (ref 0.0–0.7)
Eosinophil %: 0.6 %
HCT: 39.9 % (ref 35.0–47.0)
HGB: 12.8 g/dL (ref 12.0–16.0)
Lymphocyte #: 1.8 x10 3/mm (ref 1.0–3.6)
Lymphocyte %: 27.9 %
MCH: 28.7 pg (ref 26.0–34.0)
MCHC: 32.1 g/dL (ref 32.0–36.0)
MCV: 89 fL (ref 80–100)
MONOS PCT: 6.5 %
Monocyte #: 0.4 x10 3/mm (ref 0.2–0.9)
NEUTROS ABS: 4.2 x10 3/mm (ref 1.4–6.5)
Neutrophil %: 64.1 %
PLATELETS: 205 x10 3/mm (ref 150–440)
RBC: 4.46 10*6/uL (ref 3.80–5.20)
RDW: 13 % (ref 11.5–14.5)
WBC: 6.5 x10 3/mm (ref 3.6–11.0)

## 2014-07-19 LAB — HEPATIC FUNCTION PANEL A (ARMC)
ALK PHOS: 64 U/L
AST: 15 U/L (ref 15–37)
Albumin: 3.7 g/dL (ref 3.4–5.0)
BILIRUBIN TOTAL: 0.2 mg/dL (ref 0.2–1.0)
SGPT (ALT): 20 U/L
Total Protein: 7.3 g/dL (ref 6.4–8.2)

## 2014-07-19 LAB — CREATININE, SERUM
CREATININE: 0.98 mg/dL (ref 0.60–1.30)
EGFR (Non-African Amer.): >60

## 2014-08-08 ENCOUNTER — Ambulatory Visit: Payer: Self-pay | Admitting: Internal Medicine

## 2014-09-27 ENCOUNTER — Ambulatory Visit: Payer: Self-pay | Admitting: Internal Medicine

## 2015-01-13 ENCOUNTER — Emergency Department: Payer: Self-pay | Admitting: Emergency Medicine

## 2015-01-13 LAB — CBC WITH DIFFERENTIAL/PLATELET
BASOS ABS: 0.1 10*3/uL (ref 0.0–0.1)
Basophil %: 0.5 %
EOS PCT: 0.2 %
Eosinophil #: 0 10*3/uL (ref 0.0–0.7)
HCT: 42 % (ref 35.0–47.0)
HGB: 13.3 g/dL (ref 12.0–16.0)
LYMPHS ABS: 1.5 10*3/uL (ref 1.0–3.6)
Lymphocyte %: 13.2 %
MCH: 28.4 pg (ref 26.0–34.0)
MCHC: 31.8 g/dL — ABNORMAL LOW (ref 32.0–36.0)
MCV: 89 fL (ref 80–100)
MONO ABS: 0.8 x10 3/mm (ref 0.2–0.9)
Monocyte %: 6.8 %
NEUTROS PCT: 79.3 %
Neutrophil #: 9.1 10*3/uL — ABNORMAL HIGH (ref 1.4–6.5)
PLATELETS: 213 10*3/uL (ref 150–440)
RBC: 4.69 10*6/uL (ref 3.80–5.20)
RDW: 13.3 % (ref 11.5–14.5)
WBC: 11.4 10*3/uL — AB (ref 3.6–11.0)

## 2015-01-13 LAB — COMPREHENSIVE METABOLIC PANEL
ALT: 24 U/L (ref 14–63)
AST: 27 U/L (ref 15–37)
Albumin: 4.4 g/dL (ref 3.4–5.0)
Alkaline Phosphatase: 54 U/L (ref 46–116)
Anion Gap: 5 — ABNORMAL LOW (ref 7–16)
BUN: 13 mg/dL (ref 7–18)
Bilirubin,Total: 0.4 mg/dL (ref 0.2–1.0)
CHLORIDE: 107 mmol/L (ref 98–107)
CO2: 27 mmol/L (ref 21–32)
CREATININE: 0.97 mg/dL (ref 0.60–1.30)
Calcium, Total: 8.7 mg/dL (ref 8.5–10.1)
Glucose: 68 mg/dL (ref 65–99)
Osmolality: 276 (ref 275–301)
Potassium: 3.5 mmol/L (ref 3.5–5.1)
Sodium: 139 mmol/L (ref 136–145)
TOTAL PROTEIN: 8 g/dL (ref 6.4–8.2)

## 2015-01-13 LAB — URINALYSIS, COMPLETE
Bilirubin,UR: NEGATIVE
Glucose,UR: NEGATIVE mg/dL (ref 0–75)
Ketone: NEGATIVE
NITRITE: NEGATIVE
PROTEIN: NEGATIVE
Ph: 6 (ref 4.5–8.0)
RBC,UR: 2 /HPF (ref 0–5)
Specific Gravity: 1.003 (ref 1.003–1.030)
Squamous Epithelial: <1

## 2015-01-18 ENCOUNTER — Ambulatory Visit: Payer: Self-pay | Admitting: Internal Medicine

## 2015-03-21 ENCOUNTER — Emergency Department
Admit: 2015-03-21 | Disposition: A | Discharge status: Home / Self Care | Payer: Self-pay | Admitting: Emergency Medicine

## 2015-03-21 LAB — CBC WITH DIFFERENTIAL/PLATELET
Basophil #: 0 10*3/uL (ref 0.0–0.1)
Basophil %: 0.5 %
Eosinophil #: 0 10*3/uL (ref 0.0–0.7)
Eosinophil %: 0.6 %
HCT: 39.7 % (ref 35.0–47.0)
HGB: 12.9 g/dL (ref 12.0–16.0)
LYMPHS PCT: 25.7 %
Lymphocyte #: 2 10*3/uL (ref 1.0–3.6)
MCH: 29.2 pg (ref 26.0–34.0)
MCHC: 32.5 g/dL (ref 32.0–36.0)
MCV: 90 fL (ref 80–100)
MONO ABS: 0.6 x10 3/mm (ref 0.2–0.9)
Monocyte %: 7.3 %
NEUTROS PCT: 65.9 %
Neutrophil #: 5.2 10*3/uL (ref 1.4–6.5)
PLATELETS: 205 10*3/uL (ref 150–440)
RBC: 4.42 10*6/uL (ref 3.80–5.20)
RDW: 14 % (ref 11.5–14.5)
WBC: 7.9 10*3/uL (ref 3.6–11.0)

## 2015-03-21 LAB — URINALYSIS, COMPLETE
Bacteria: NONE SEEN
Bilirubin,UR: NEGATIVE
Blood: NEGATIVE
GLUCOSE, UR: NEGATIVE mg/dL (ref 0–75)
KETONE: NEGATIVE
LEUKOCYTE ESTERASE: NEGATIVE
Nitrite: NEGATIVE
PH: 6 (ref 4.5–8.0)
Protein: NEGATIVE
Specific Gravity: 1.016 (ref 1.003–1.030)
WBC UR: NONE SEEN /HPF (ref 0–5)

## 2015-03-21 LAB — WET PREP, GENITAL

## 2015-03-21 LAB — COMPREHENSIVE METABOLIC PANEL
ALBUMIN: 4.3 g/dL
Alkaline Phosphatase: 42 U/L
Anion Gap: 6 — ABNORMAL LOW (ref 7–16)
BUN: 12 mg/dL
Bilirubin,Total: 0.3 mg/dL
CREATININE: 0.85 mg/dL
Calcium, Total: 8.7 mg/dL — ABNORMAL LOW
Chloride: 105 mmol/L
Co2: 26 mmol/L
EGFR (Non-African Amer.): >60
GLUCOSE: 86 mg/dL
Potassium: 3.4 mmol/L — ABNORMAL LOW
SGOT(AST): 19 U/L
SGPT (ALT): 17 U/L
SODIUM: 137 mmol/L
Total Protein: 7.3 g/dL

## 2015-03-22 LAB — GC/CHLAMYDIA PROBE AMP

## 2015-03-30 NOTE — Op Note (Signed)
PATIENT NAME:  Kristina Li, Kristina Li MR#:  [id number] DATE OF BIRTH:  May 18, 1981  DATE OF PROCEDURE:  11/23/2013  PREOPERATIVE DIAGNOSIS: Biliary colic and cholelithiasis.  POSTOPERATIVE DIAGNOSIS: Biliary colic and cholelithiasis.   PROCEDURE PERFORMED: Robotically assisted multiport laparoscopic cholecystectomy.   SURGEON: Sherri Rad, M.D.   ASSISTANT: Surgical scrub technologist x 2.   TYPE OF ANESTHESIA: General oroendotracheal.   ESTIMATED BLOOD LOSS: Minimal.   FINDINGS: As above.   SPECIMENS: Gallbladder with contents.   DESCRIPTION OF PROCEDURE: With informed consent, supine position and general oral endotracheal anesthesia, the patient's abdomen was widely prepped and draped with ChloraPrep solution. The right arm was padded and tucked at her side. A 12 mm blunt balloon Hassan trocar was placed under visualization with stay sutures being passed through the fascia and pneumoperitoneum was established. Limited laparoscopic evaluation of the abdomen demonstrated no adhesions. Liver appeared normal. Gallbladder appeared normal. Some adhesions to the gallbladder fundus. An 8 mm da Vinci trocar was placed lateral in the abdomen on the left side, 10 cm away from the midline. An 8.5 mm daVinci port was placed 10 cm lateral on the right side as well and slightly more superiro than than umbilicus.  A 5 mm assistant port was placed in the right lateral abdomen.  Robot arms were then docked and instruments were then placed under direct visualization.  I then moved to the console.   The gallbladder was grasped along its fundus and elevated towards the right shoulder and laterally. Traction was placed on Hartman's pouch. The hepatoduodenal ligament was then incised with hook electrocautery and blunt technique, liberating a cystic duct and cystic artery with posterior branch and a critical view of safety cholecystectomy was achieved. Locking type medium Kelly Weck clips were then placed, 2 on the  proximal side of the cystic duct, 1 on the gallbladder side and the structure was then divided with hook cautery on cut. The cystic duct was doubly clipped on the portal side, singly clipped on the gallbladder side and likewise divided. The gallbladder was then retrieved off the gallbladder fossa after identification of the posterior cystic artery, which was divided between single hemoclips. The gallbladder was placed into an Endo Catch device and retrieved. Laparoscope was then placed back into the abdomen. Examination of the trocar sites demonstrated no evidence of bowel injury. The right upper quadrant was then irrigated with 500 mL of warm normal saline and aspirated dry and hemostasis appeared to be adequate on the operative field. Ports were then removed under direct visualization. The infraumbilical fascial defect being reapproximated with an additional figure-of-eight #0 Vicryl suture in vertical orientation. The existing stay sutures tied to each other. A total of 20 mL of 0.25% plain Marcaine was infiltrated along all skin and fascial incisions prior to skin closure. A combination of 4-0 Vicryl and 4-0 nylon was used to close skin edges in a subcuticular in simple and vertical mattress orientation. Sterile dressings were then applied. The patient was subsequently extubated and taken to the recovery room in stable and satisfactory condition by anesthesia services.    ____________________________ Jeannette How Marina Gravel, MD mab:aw D: 11/23/2013 09:30:39 ET T: 11/23/2013 10:03:32 ET JOB#: [id number]  cc: Elta Guadeloupe A. Marina Gravel, MD, <Dictator> Hortencia Conradi MD ELECTRONICALLY SIGNED 11/29/2013 7:51

## 2015-04-08 NOTE — Consult Note (Signed)
Brief Consult Note: Diagnosis: Endometriosis.   Recommend further assessment or treatment.   Discussed with Attending MD.   Comments: Discussed case with Dr. Reita Cliche, radiology concerned regarding possible right ovarian torsion on ultrasound.  Based on review of patient history as well as prior operative report from 2012 the patient has undergone prior right salpingo-oophorectomy.  In absence of white count, focal right lower quadrant pain this likely represents chronic changes from her endometriosis.  Images were reviewed personally.  She does require outpatient follow as this is a chronic condition and she may benefit form depo leupron supression or LSO depending on symptoms.  Electronic Signatures: Dorthula Nettles (MD)  (Signed 13-Apr-16 23:38)  Authored: Brief Consult Note   Last Updated: 13-Apr-16 23:38 by Dorthula Nettles (MD)

## 2015-04-10 ENCOUNTER — Emergency Department
Admission: EM | Admit: 2015-04-10 | Discharge: 2015-04-10 | Disposition: A | Discharge status: Home / Self Care | Payer: Medicaid Other | Attending: Emergency Medicine | Admitting: Emergency Medicine

## 2015-04-10 ENCOUNTER — Encounter: Payer: Self-pay | Admitting: Emergency Medicine

## 2015-04-10 DIAGNOSIS — Z91040 Latex allergy status: Secondary | ICD-10-CM | POA: Insufficient documentation

## 2015-04-10 DIAGNOSIS — F32A Depression, unspecified: Secondary | ICD-10-CM

## 2015-04-10 DIAGNOSIS — F329 Major depressive disorder, single episode, unspecified: Secondary | ICD-10-CM | POA: Diagnosis not present

## 2015-04-10 DIAGNOSIS — Z79899 Other long term (current) drug therapy: Secondary | ICD-10-CM | POA: Insufficient documentation

## 2015-04-10 DIAGNOSIS — F419 Anxiety disorder, unspecified: Secondary | ICD-10-CM | POA: Diagnosis not present

## 2015-04-10 LAB — COMPREHENSIVE METABOLIC PANEL
ALBUMIN: 4.4 g/dL (ref 3.5–5.0)
ALT: 18 U/L (ref 14–54)
AST: 17 U/L (ref 15–41)
Alkaline Phosphatase: 45 U/L (ref 38–126)
Anion gap: 13 (ref 5–15)
BILIRUBIN TOTAL: 0.2 mg/dL — AB (ref 0.3–1.2)
BUN: 14 mg/dL (ref 6–20)
CHLORIDE: 104 mmol/L (ref 101–111)
CO2: 23 mmol/L (ref 22–32)
CREATININE: 0.94 mg/dL (ref 0.44–1.00)
Calcium: 9.2 mg/dL (ref 8.9–10.3)
GFR calc Af Amer: >60 mL/min (ref >60)
GFR calc non Af Amer: >60 mL/min (ref >60)
Glucose, Bld: 91 mg/dL (ref 65–99)
Potassium: 4.5 mmol/L (ref 3.5–5.1)
Sodium: 140 mmol/L (ref 135–145)
Total Protein: 7.5 g/dL (ref 6.5–8.1)

## 2015-04-10 LAB — CBC WITH DIFFERENTIAL/PLATELET
Basophils Absolute: 0 10*3/uL (ref 0–0.1)
Basophils Relative: 0 %
EOS ABS: 0 10*3/uL (ref 0–0.7)
HCT: 41 % (ref 35.0–47.0)
Hemoglobin: 13.2 g/dL (ref 12.0–16.0)
LYMPHS ABS: 2 10*3/uL (ref 1.0–3.6)
Lymphocytes Relative: 20 %
MCH: 28.6 pg (ref 26.0–34.0)
MCHC: 32.2 g/dL (ref 32.0–36.0)
MCV: 88.8 fL (ref 80.0–100.0)
Monocytes Absolute: 0.6 10*3/uL (ref 0.2–0.9)
Monocytes Relative: 6 %
Neutro Abs: 7.3 10*3/uL — ABNORMAL HIGH (ref 1.4–6.5)
PLATELETS: 281 10*3/uL (ref 150–440)
RBC: 4.62 MIL/uL (ref 3.80–5.20)
RDW: 13.7 % (ref 11.5–14.5)
WBC: 10 10*3/uL (ref 3.6–11.0)

## 2015-04-10 LAB — TSH: TSH: 1.886 u[IU]/mL (ref 0.350–4.500)

## 2015-04-10 LAB — T4, FREE: FREE T4: 0.82 ng/dL (ref 0.61–1.12)

## 2015-04-10 MED ORDER — DIAZEPAM 5 MG PO TABS: 5.0000 mg | ORAL_TABLET | Freq: Three times a day (TID) | ORAL | Status: DC | PRN

## 2015-04-10 MED ORDER — DIAZEPAM 5 MG PO TABS
ORAL_TABLET | ORAL | Status: AC
  Administered 2015-04-10: 10 mg via ORAL
  Filled 2015-04-10: qty 2

## 2015-04-10 MED ORDER — DIAZEPAM 5 MG PO TABS
10.0000 mg | ORAL_TABLET | Freq: Once | ORAL | Status: AC
  Administered 2015-04-10: 10 mg via ORAL
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
## 2015-04-10 NOTE — Discharge Instructions (Signed)

## 2015-04-10 NOTE — ED Notes (Signed)
States she is feeling sl depressed.. Pt denies any s/i or h/i..the patient does not have thyroid gland and is not on any meds

## 2015-04-10 NOTE — ED Notes (Signed)
Called lab for progress of CBC and CMP.

## 2015-04-10 NOTE — ED Notes (Signed)
Reports has hx of hypothyroidism and is taking thyroid meds but suppose to take anxiety and depression meds but hasnt been taking them.  Reports depression, loss of interest, and sleepy distrubance.  Pt reports tired of feeling depressed and I think it is because of my thyroid so they drew blood today.  Denies SI/HI.

## 2015-04-10 NOTE — ED Notes (Signed)
Called lab with no answer to add on blood test.

## 2015-04-13 ENCOUNTER — Other Ambulatory Visit: Payer: Medicaid Other

## 2015-04-13 ENCOUNTER — Encounter: Payer: Self-pay | Admitting: *Deleted

## 2015-04-16 ENCOUNTER — Encounter
Admission: RE | Admit: 2015-04-16 | Discharge: 2015-04-16 | Disposition: A | Discharge status: Home / Self Care | Payer: Medicaid Other | Source: Ambulatory Visit | Attending: Obstetrics & Gynecology | Admitting: Obstetrics & Gynecology

## 2015-04-16 DIAGNOSIS — R011 Cardiac murmur, unspecified: Secondary | ICD-10-CM | POA: Diagnosis not present

## 2015-04-16 DIAGNOSIS — Z01812 Encounter for preprocedural laboratory examination: Secondary | ICD-10-CM | POA: Insufficient documentation

## 2015-04-16 DIAGNOSIS — R0602 Shortness of breath: Secondary | ICD-10-CM | POA: Insufficient documentation

## 2015-04-16 DIAGNOSIS — Z01810 Encounter for preprocedural cardiovascular examination: Secondary | ICD-10-CM | POA: Diagnosis present

## 2015-04-16 LAB — CBC
HCT: 37.6 % (ref 35.0–47.0)
HEMOGLOBIN: 11.9 g/dL — AB (ref 12.0–16.0)
MCH: 28.6 pg (ref 26.0–34.0)
MCHC: 31.8 g/dL — ABNORMAL LOW (ref 32.0–36.0)
MCV: 90.2 fL (ref 80.0–100.0)
Platelets: 257 10*3/uL (ref 150–440)
RBC: 4.17 MIL/uL (ref 3.80–5.20)
RDW: 14.2 % (ref 11.5–14.5)
WBC: 7.5 10*3/uL (ref 3.6–11.0)

## 2015-04-16 LAB — TYPE AND SCREEN
ABO/RH(D): O POS
Antibody Screen: NEGATIVE

## 2015-04-16 LAB — ABO/RH: ABO/RH(D): O POS

## 2015-04-17 NOTE — OR Nursing (Signed)
EKG reviewed by anesthesia and ok

## 2015-04-18 ENCOUNTER — Encounter: Payer: Self-pay | Admitting: *Deleted

## 2015-04-18 DIAGNOSIS — Z85850 Personal history of malignant neoplasm of thyroid: Secondary | ICD-10-CM | POA: Diagnosis not present

## 2015-04-18 DIAGNOSIS — N830 Follicular cyst of ovary: Secondary | ICD-10-CM | POA: Diagnosis not present

## 2015-04-18 DIAGNOSIS — K9171 Accidental puncture and laceration of a digestive system organ or structure during a digestive system procedure: Secondary | ICD-10-CM | POA: Diagnosis not present

## 2015-04-18 DIAGNOSIS — E890 Postprocedural hypothyroidism: Secondary | ICD-10-CM | POA: Diagnosis not present

## 2015-04-18 DIAGNOSIS — Y658 Other specified misadventures during surgical and medical care: Secondary | ICD-10-CM | POA: Diagnosis not present

## 2015-04-18 DIAGNOSIS — Z79899 Other long term (current) drug therapy: Secondary | ICD-10-CM | POA: Diagnosis not present

## 2015-04-18 DIAGNOSIS — Z9049 Acquired absence of other specified parts of digestive tract: Secondary | ICD-10-CM | POA: Diagnosis not present

## 2015-04-18 DIAGNOSIS — Z8249 Family history of ischemic heart disease and other diseases of the circulatory system: Secondary | ICD-10-CM | POA: Diagnosis not present

## 2015-04-18 DIAGNOSIS — N736 Female pelvic peritoneal adhesions (postinfective): Secondary | ICD-10-CM | POA: Diagnosis not present

## 2015-04-18 DIAGNOSIS — R102 Pelvic and perineal pain: Secondary | ICD-10-CM | POA: Diagnosis not present

## 2015-04-18 DIAGNOSIS — N941 Dyspareunia: Secondary | ICD-10-CM | POA: Diagnosis not present

## 2015-04-18 DIAGNOSIS — N7011 Chronic salpingitis: Secondary | ICD-10-CM | POA: Diagnosis not present

## 2015-04-18 DIAGNOSIS — N8320 Unspecified ovarian cysts: Secondary | ICD-10-CM | POA: Diagnosis present

## 2015-04-19 ENCOUNTER — Encounter
Admission: RE | Disposition: A | Discharge status: Home / Self Care | Payer: Self-pay | Source: Ambulatory Visit | Attending: Obstetrics & Gynecology

## 2015-04-19 ENCOUNTER — Ambulatory Visit: Payer: Medicaid Other | Admitting: Anesthesiology

## 2015-04-19 ENCOUNTER — Ambulatory Visit
Admission: RE | Admit: 2015-04-19 | Discharge: 2015-04-19 | Disposition: A | Discharge status: Home / Self Care | Payer: Medicaid Other | Source: Ambulatory Visit | Attending: Obstetrics & Gynecology | Admitting: Obstetrics & Gynecology

## 2015-04-19 DIAGNOSIS — K9171 Accidental puncture and laceration of a digestive system organ or structure during a digestive system procedure: Secondary | ICD-10-CM | POA: Insufficient documentation

## 2015-04-19 DIAGNOSIS — N830 Follicular cyst of ovary: Secondary | ICD-10-CM | POA: Insufficient documentation

## 2015-04-19 DIAGNOSIS — N941 Dyspareunia: Secondary | ICD-10-CM | POA: Insufficient documentation

## 2015-04-19 DIAGNOSIS — Z85850 Personal history of malignant neoplasm of thyroid: Secondary | ICD-10-CM | POA: Insufficient documentation

## 2015-04-19 DIAGNOSIS — Z9049 Acquired absence of other specified parts of digestive tract: Secondary | ICD-10-CM | POA: Insufficient documentation

## 2015-04-19 DIAGNOSIS — N736 Female pelvic peritoneal adhesions (postinfective): Secondary | ICD-10-CM | POA: Insufficient documentation

## 2015-04-19 DIAGNOSIS — N7011 Chronic salpingitis: Secondary | ICD-10-CM | POA: Diagnosis not present

## 2015-04-19 DIAGNOSIS — Z8249 Family history of ischemic heart disease and other diseases of the circulatory system: Secondary | ICD-10-CM | POA: Insufficient documentation

## 2015-04-19 DIAGNOSIS — E890 Postprocedural hypothyroidism: Secondary | ICD-10-CM | POA: Insufficient documentation

## 2015-04-19 DIAGNOSIS — R102 Pelvic and perineal pain: Secondary | ICD-10-CM | POA: Insufficient documentation

## 2015-04-19 DIAGNOSIS — Y658 Other specified misadventures during surgical and medical care: Secondary | ICD-10-CM | POA: Insufficient documentation

## 2015-04-19 DIAGNOSIS — Z79899 Other long term (current) drug therapy: Secondary | ICD-10-CM | POA: Insufficient documentation

## 2015-04-19 HISTORY — DX: Cardiac murmur, unspecified: R01.1

## 2015-04-19 HISTORY — DX: Hypothyroidism, unspecified: E03.9

## 2015-04-19 HISTORY — DX: Unspecified asthma, uncomplicated: J45.909

## 2015-04-19 HISTORY — DX: Depression, unspecified: F32.A

## 2015-04-19 HISTORY — PX: LAPAROSCOPY: SHX197

## 2015-04-19 HISTORY — DX: Methicillin resistant Staphylococcus aureus infection, unspecified site: A49.02

## 2015-04-19 HISTORY — PX: CHROMOPERTUBATION: SHX6288

## 2015-04-19 HISTORY — DX: Anemia, unspecified: D64.9

## 2015-04-19 HISTORY — DX: Fibromyalgia: M79.7

## 2015-04-19 HISTORY — PX: LAPAROSCOPIC LYSIS OF ADHESIONS: SHX5905

## 2015-04-19 HISTORY — PX: LAPAROSCOPIC UNILATERAL SALPINGECTOMY: SHX5934

## 2015-04-19 HISTORY — DX: Headache, unspecified: R51.9

## 2015-04-19 HISTORY — DX: Headache: R51

## 2015-04-19 HISTORY — DX: Nonrheumatic mitral (valve) prolapse: I34.1

## 2015-04-19 HISTORY — DX: Major depressive disorder, single episode, unspecified: F32.9

## 2015-04-19 HISTORY — DX: Cardiac arrhythmia, unspecified: I49.9

## 2015-04-19 LAB — POCT PREGNANCY, URINE: Preg Test, Ur: NEGATIVE

## 2015-04-19 LAB — POCT URINE PREGNANCY: Preg Test, Ur: NEGATIVE

## 2015-04-19 SURGERY — LAPAROSCOPY OPERATIVE
Anesthesia: General

## 2015-04-19 MED ORDER — ONDANSETRON HCL 4 MG/2ML IJ SOLN
INTRAMUSCULAR | Status: DC | PRN
  Administered 2015-04-19: 4 mg via INTRAVENOUS

## 2015-04-19 MED ORDER — BUPIVACAINE HCL (PF) 0.5 % IJ SOLN
INTRAMUSCULAR | Status: DC | PRN
  Administered 2015-04-19: 8 mL

## 2015-04-19 MED ORDER — LACTATED RINGERS IV SOLN: INTRAVENOUS | Status: DC

## 2015-04-19 MED ORDER — LIDOCAINE HCL (PF) 1 % IJ SOLN
INTRAMUSCULAR | Status: AC
  Administered 2015-04-19: 2 mL
  Filled 2015-04-19: qty 2

## 2015-04-19 MED ORDER — FENTANYL CITRATE (PF) 100 MCG/2ML IJ SOLN
25.0000 ug | INTRAMUSCULAR | Status: AC | PRN
  Administered 2015-04-19 (×4): 25 ug via INTRAVENOUS

## 2015-04-19 MED ORDER — PROPOFOL 10 MG/ML IV BOLUS
INTRAVENOUS | Status: DC | PRN
  Administered 2015-04-19: 150 mg via INTRAVENOUS

## 2015-04-19 MED ORDER — GLYCOPYRROLATE 0.2 MG/ML IJ SOLN
INTRAMUSCULAR | Status: DC | PRN
  Administered 2015-04-19: 0.6 mg via INTRAVENOUS

## 2015-04-19 MED ORDER — FENTANYL CITRATE (PF) 100 MCG/2ML IJ SOLN
INTRAMUSCULAR | Status: DC | PRN
  Administered 2015-04-19: 100 ug via INTRAVENOUS
  Administered 2015-04-19 (×3): 50 ug via INTRAVENOUS

## 2015-04-19 MED ORDER — LACTATED RINGERS IV SOLN
INTRAVENOUS | Status: DC
  Administered 2015-04-19 (×2): via INTRAVENOUS

## 2015-04-19 MED ORDER — ONDANSETRON HCL 4 MG/2ML IJ SOLN: 4.0000 mg | Freq: Once | INTRAMUSCULAR | Status: DC | PRN

## 2015-04-19 MED ORDER — BUPIVACAINE HCL (PF) 0.5 % IJ SOLN
INTRAMUSCULAR | Status: AC
  Filled 2015-04-19: qty 30

## 2015-04-19 MED ORDER — DEXAMETHASONE SODIUM PHOSPHATE 4 MG/ML IJ SOLN
INTRAMUSCULAR | Status: DC | PRN
  Administered 2015-04-19: 5 mg via INTRAVENOUS

## 2015-04-19 MED ORDER — LIDOCAINE HCL (CARDIAC) 20 MG/ML IV SOLN: INTRAVENOUS | Status: DC | PRN

## 2015-04-19 MED ORDER — FAMOTIDINE 20 MG PO TABS
20.0000 mg | ORAL_TABLET | Freq: Once | ORAL | Status: AC
  Administered 2015-04-19: 20 mg via ORAL

## 2015-04-19 MED ORDER — NEOSTIGMINE METHYLSULFATE 10 MG/10ML IV SOLN
INTRAVENOUS | Status: DC | PRN
  Administered 2015-04-19: 3 mg via INTRAVENOUS

## 2015-04-19 MED ORDER — FAMOTIDINE 20 MG PO TABS
ORAL_TABLET | ORAL | Status: AC
  Filled 2015-04-19: qty 1

## 2015-04-19 MED ORDER — METHYLENE BLUE 1 % INJ SOLN
INTRAMUSCULAR | Status: AC
  Filled 2015-04-19: qty 10

## 2015-04-19 MED ORDER — ACETAMINOPHEN 500 MG PO TABS: 1000.0000 mg | ORAL_TABLET | Freq: Four times a day (QID) | ORAL | Status: DC

## 2015-04-19 MED ORDER — KETOROLAC TROMETHAMINE 30 MG/ML IJ SOLN
30.0000 mg | Freq: Once | INTRAMUSCULAR | Status: AC
  Administered 2015-04-19: 30 mg via INTRAVENOUS

## 2015-04-19 MED ORDER — OXYCODONE-ACETAMINOPHEN 5-325 MG PO TABS
ORAL_TABLET | ORAL | Status: AC
  Administered 2015-04-19: 1 via ORAL
  Filled 2015-04-19: qty 1

## 2015-04-19 MED ORDER — FENTANYL CITRATE (PF) 100 MCG/2ML IJ SOLN
INTRAMUSCULAR | Status: AC
  Administered 2015-04-19: 25 ug via INTRAVENOUS
  Filled 2015-04-19: qty 2

## 2015-04-19 MED ORDER — ROCURONIUM BROMIDE 100 MG/10ML IV SOLN
INTRAVENOUS | Status: DC | PRN
  Administered 2015-04-19: 25 mg via INTRAVENOUS
  Administered 2015-04-19: 10 mg via INTRAVENOUS

## 2015-04-19 MED ORDER — OXYCODONE-ACETAMINOPHEN 5-325 MG PO TABS
1.0000 | ORAL_TABLET | ORAL | Status: DC | PRN
  Administered 2015-04-19: 1 via ORAL

## 2015-04-19 MED ORDER — MIDAZOLAM HCL 2 MG/2ML IJ SOLN
INTRAMUSCULAR | Status: DC | PRN
  Administered 2015-04-19: 2 mg via INTRAVENOUS

## 2015-04-19 MED ORDER — OXYCODONE-ACETAMINOPHEN 5-325 MG PO TABS: 1.0000 | ORAL_TABLET | ORAL | Status: DC | PRN

## 2015-04-19 MED ORDER — KETOROLAC TROMETHAMINE 60 MG/2ML IM SOLN
INTRAMUSCULAR | Status: AC
  Filled 2015-04-19: qty 2

## 2015-04-19 SURGICAL SUPPLY — 37 items
BLADE SURG SZ11 CARB STEEL (BLADE) ×6 IMPLANT
CANISTER SUCT 1200ML W/VALVE (MISCELLANEOUS) ×6 IMPLANT
CATH ROBINSON RED A/P 16FR (CATHETERS) ×3 IMPLANT
CHLORAPREP W/TINT 26ML (MISCELLANEOUS) ×6 IMPLANT
DRESSING TELFA 4X3 1S ST N-ADH (GAUZE/BANDAGES/DRESSINGS) IMPLANT
ENDOPOUCH RETRIEVER 10 (MISCELLANEOUS) IMPLANT
GAUZE SPONGE NON-WVN 2X2 STRL (MISCELLANEOUS) ×6 IMPLANT
GLOVE BIO SURGEON STRL SZ8 (GLOVE) ×12 IMPLANT
GLOVE INDICATOR 7.5 STRL GRN (GLOVE) ×12 IMPLANT
GOWN STRL REUS W/ TWL LRG LVL3 (GOWN DISPOSABLE) ×4 IMPLANT
GOWN STRL REUS W/ TWL XL LVL3 (GOWN DISPOSABLE) ×4 IMPLANT
GOWN STRL REUS W/TWL LRG LVL3 (GOWN DISPOSABLE) ×6
GOWN STRL REUS W/TWL XL LVL3 (GOWN DISPOSABLE) ×6
IRRIGATION STRYKERFLOW (MISCELLANEOUS) ×1 IMPLANT
IRRIGATOR STRYKERFLOW (MISCELLANEOUS) ×6
IV LACTATED RINGERS 1000ML (IV SOLUTION) IMPLANT
LABEL OR SOLS (LABEL) ×6 IMPLANT
LIQUID BAND (GAUZE/BANDAGES/DRESSINGS) ×6 IMPLANT
NEEDLE VERESS 14GA 120MM (NEEDLE) ×6 IMPLANT
NS IRRIG 500ML POUR BTL (IV SOLUTION) ×6 IMPLANT
PACK GYN LAPAROSCOPIC (MISCELLANEOUS) ×6 IMPLANT
PAD PREP 24X41 OB/GYN DISP (PERSONAL CARE ITEMS) ×6 IMPLANT
RELOAD ENDO STITCH 2.0 (ENDOMECHANICALS) ×6
RELOAD SUT SNGL STCH ABSRB 2-0 (ENDOMECHANICALS) IMPLANT
SCISSORS METZENBAUM CVD 33 (INSTRUMENTS) ×6 IMPLANT
SHEARS HARMONIC ACE PLUS 36CM (ENDOMECHANICALS) IMPLANT
SLEEVE ENDOPATH XCEL 5M (ENDOMECHANICALS) ×4 IMPLANT
SPONGE VERSALON 2X2 STRL (MISCELLANEOUS) ×18
STRAP SAFETY BODY (MISCELLANEOUS) ×6 IMPLANT
SUT RELOAD ENDO STITCH 2 48X1 (ENDOMECHANICALS) ×4
SUT VIC AB 2-0 UR6 27 (SUTURE) ×4 IMPLANT
SUT VIC AB 4-0 PS2 18 (SUTURE) ×4 IMPLANT
SUTURE RELOAD END STTCH 2 48X1 (ENDOMECHANICALS) ×4 IMPLANT
SYRINGE 10CC LL (SYRINGE) ×6 IMPLANT
TROCAR ENDO BLADELESS 11MM (ENDOMECHANICALS) ×4 IMPLANT
TROCAR XCEL NON-BLD 5MMX100MML (ENDOMECHANICALS) ×6 IMPLANT
TUBING INSUFFLATOR HI FLOW (MISCELLANEOUS) ×6 IMPLANT

## 2015-04-19 NOTE — H&P (Signed)
History and Physical Interval Note:  04/19/2015 11:33 AM  Kristina Li  has presented today for surgery, with the diagnosis of ovarian cyst  The various methods of treatment have been discussed with the patient and family. After consideration of risks, benefits and other options for treatment, the patient has consented to  Procedure(s): OVARIAN CYSTECTOMY (Right) LAPAROSCOPY OPERATIVE (N/A) CHROMOPERTUBATION (N/A) as a surgical intervention .  The patient's history has been reviewed, patient examined, no change in status, stable for surgery.  Pt has the following beta blocker history-  Not taking Beta Blocker.  I have reviewed the patient's chart and labs.  Questions were answered to the patient's satisfaction.       Deema Juncaj Eddie Dibbles

## 2015-04-19 NOTE — Anesthesia Procedure Notes (Signed)
Procedure Name: Intubation Date/Time: 04/19/2015 1:48 PM Performed by: Christy Sartorius Pre-anesthesia Checklist: Patient identified, Emergency Drugs available, Suction available, Patient being monitored and Timeout performed Patient Re-evaluated:Patient Re-evaluated prior to inductionOxygen Delivery Method: Circle system utilized Preoxygenation: Pre-oxygenation with 100% oxygen Intubation Type: IV induction Ventilation: Mask ventilation without difficulty Laryngoscope Size: Mac and 3 Grade View: Grade II Tube type: Oral Tube size: 7.0 mm Number of attempts: 1 Airway Equipment and Method: Stylet Placement Confirmation: ETT inserted through vocal cords under direct vision,  positive ETCO2 and breath sounds checked- equal and bilateral Secured at: 20 cm Tube secured with: Tape Dental Injury: Teeth and Oropharynx as per pre-operative assessment

## 2015-04-19 NOTE — Op Note (Addendum)
  Operative Note   04/19/2015  PRE-OP DIAGNOSIS:  Ovarian Cyst, Pelvic Pain   POST-OP DIAGNOSIS: Adnexal Mass- hysdrosalpinx (Left sided), Extensive Thick Adhesions, Pelvic pain   PROCEDURE: Procedure(s): LAPAROSCOPY OPERATIVE CHROMOPERTUBATION LAPAROSCOPIC LYSIS OF ADHESIONS LAPAROSCOPIC UNILATERAL SALPINGECTOMY  (LEFT) 40981, [id number]  SURGEON: Barnett Applebaum, MD, FACOG  ASST: Ward, MD  ANESTHESIA: Choice   ESTIMATED BLOOD LOSS: 50 mL  COMPLICATIONS: No    DISPOSITION: PACU - hemodynamically stable.  CONDITION: stable  FINDINGS: Laparoscopic survey of the abdomen revealed a grossly normal uterus, tubes, ovaries, liver edge, gallbladder edge and appendix, Severe dense and fibrous intra-abdominal adhesions were noted. distorting the anatomy and unusually complex. No Ovarian Cyst noted, but thickened adherent left adnexal mass (no ovary identified, prior left oophorectomy); adhesions on right w no tube and ovary stuck in adhesions and broad ligament.  PROCEDURE IN DETAIL: The patient was taken to the OR where anesthesia was administed. The patient was positioned in dorsal lithotomy in the [place]. The patient was then examined under anesthesia with the above noted findings. The patient was prepped and draped in the normal sterile fashion and foley catheter was placed. A Graves speculum was placed in the vagina and the anterior lip of the cervix was grasped with a single toothed tenaculum. A Hulka uterine manipulator was then inserted in the uterus. Uterine mobility was found to be satisfactory. The speculum was then removed.  Attention was turned to the patient's abdomen where a 5 mm skin incision was made in the umbilical fold, after injection of local anesthesia. The Veress step needle was carefully introduced into the peritoneal cavity with placement confirmed using the hanging drop technique.  Pneumoperitoneum was obtained. The 5 mm port was then placed under direct  visualization with the operative laparoscope.  Trendelenburg positioning.  Additional 5 mm trocar was then placed in the LLQ lateral to the inferior epigastric blood vessels under direct visualization with the laparoscope.  Instrumentation to visualize complete pelvic anatomy performed.  A 42mm trocar was also then placed in the RLQ.   Significant time was spent to dissect the dense and fibrous adhesions free of bilateral adnexa as well as colon.  A serosal rent in the colon is closed with 2-0 vicryl suture using an endostitch device.  The left adnexal mass is identified and chromopertubation reveals no spillage of blue dye thru either side but particularly no spill or evidence for dye thru the left tube.  Tube is determined to not be salvagable and is excised using harmonic scapel.  Hemostasis is visualized and assured.  Pelvic cavity is cleaned with any fluid aspirated.  Mass is removed thru a endoscopic bag.  Right sided incision closed w 0 vicryl using fascial closure device.  No obvious injury to ureter, bowel, or bladder.  Instruments and trocars removed, gas expelled, and skin closed with skin adhesive glue.  Instrument, needle, and sponge counts correct x2 at the conclusion of the case.  Pt goes to recovery room in stable condition.

## 2015-04-19 NOTE — Transfer of Care (Signed)
Immediate Anesthesia Transfer of Care Note  Patient: Kristina Li  Procedure(s) Performed: Procedure(s): LAPAROSCOPY OPERATIVE (N/A) CHROMOPERTUBATION (N/A) LAPAROSCOPIC LYSIS OF ADHESIONS LAPAROSCOPIC UNILATERAL SALPINGECTOMY (Left)  Patient Location: PACU  Anesthesia Type:General  Level of Consciousness: awake and patient cooperative  Airway & Oxygen Therapy: Patient Spontanous Breathing and Patient connected to face mask oxygen  Post-op Assessment: Report given to RN and Post -op Vital signs reviewed and stable  Post vital signs: Reviewed and stable  Last Vitals:  Filed Vitals:   04/19/15 1556  BP: 115/67  Pulse: 64  Temp: 36.7 C  Resp: 15    Complications: No apparent anesthesia complications

## 2015-04-19 NOTE — Discharge Instructions (Addendum)
Do not take percocet and norco.  Take Percocet 1 every 4 hours as needed for pain.  Take stool softner twice daily while on Percocet  General Gynecological Post-Operative Instructions You may expect to feel dizzy, weak, and drowsy for as long as 24 hours after receiving the medicine that made you sleep (anesthetic).  Do not drive a car, ride a bicycle, participate in physical activities, or take public transportation until you are done taking narcotic pain medicines or as directed by your doctor.  Do not drink alcohol or take tranquilizers.  Do not take medicine that has not been prescribed by your doctor.  Do not sign important papers or make important decisions while on narcotic pain medicines.  Have a responsible person with you.  CARE OF INCISION  Keep incision clean and dry. Take showers instead of baths until your doctor gives you permission to take baths.  Avoid heavy lifting (more than 10 pounds/4.5 kilograms), pushing, or pulling.  Avoid activities that may risk injury to your surgical site.  No sexual intercourse or placement of anything in the vagina for 2 weeks or as instructed by your doctor. If you have tubes coming from the wound site, check with your doctor regarding appropriate care of the tubes. Only take prescription or over-the-counter medicines  for pain, discomfort, or fever as directed by your doctor. Do not take aspirin. It can make you bleed. Take medicines (antibiotics) that kill germs if they are prescribed for you.  Call the office or go to the ER if:  You feel sick to your stomach (nauseous) and you start to throw up (vomit).  You have trouble eating or drinking.  You have an oral temperature above 101.  You have constipation that is not helped by adjusting diet or increasing fluid intake. Pain medicines are a common cause of constipation.  You have any other concerns. SEEK IMMEDIATE MEDICAL CARE IF:  You have persistent dizziness.  You have difficulty breathing  or a congested sounding (croupy) cough.  You have an oral temperature above 102.5, not controlled by medicine.  There is increasing pain or tenderness near or in the surgical site.      General Anesthesia, Care After Refer to this sheet in the next few weeks. These instructions provide you with information on caring for yourself after your procedure. Your health care provider may also give you more specific instructions. Your treatment has been planned according to current medical practices, but problems sometimes occur. Call your health care provider if you have any problems or questions after your procedure. WHAT TO EXPECT AFTER THE PROCEDURE After the procedure, it is typical to experience: Sleepiness. Nausea and vomiting. HOME CARE INSTRUCTIONS For the first 24 hours after general anesthesia: Have a responsible person with you. Do not drive a car. If you are alone, do not take public transportation. Do not drink alcohol. Do not take medicine that has not been prescribed by your health care provider. Do not sign important papers or make important decisions. You may resume a normal diet and activities as directed by your health care provider. Change bandages (dressings) as directed. If you have questions or problems that seem related to general anesthesia, call the hospital and ask for the anesthetist or anesthesiologist on call. SEEK MEDICAL CARE IF: You have nausea and vomiting that continue the day after anesthesia. You develop a rash. SEEK IMMEDIATE MEDICAL CARE IF:  You have difficulty breathing. You have chest pain. You have any allergic problems. Document Released:  03/02/2001 Document Revised: 11/29/2013 Document Reviewed: 06/09/2013 [facility] Patient Information 2015 [place], [place]. This information is not intended to replace advice given to you by your health care provider. Make sure you discuss any questions you have with your health care provider.

## 2015-04-19 NOTE — Anesthesia Postprocedure Evaluation (Signed)
  Anesthesia Post-op Note  Patient: Kristina Li  Procedure(s) Performed: Procedure(s): LAPAROSCOPY OPERATIVE (N/A) CHROMOPERTUBATION (N/A) LAPAROSCOPIC LYSIS OF ADHESIONS LAPAROSCOPIC UNILATERAL SALPINGECTOMY (Left)  Anesthesia type:General ETT  Patient location: PACU  Post pain: Pain level controlled  Post assessment: Post-op Vital signs reviewed, Patient's Cardiovascular Status Stable, Respiratory Function Stable, Patent Airway and No signs of Nausea or vomiting  Post vital signs: Reviewed and stable  Last Vitals:  Filed Vitals:   04/19/15 1629  BP:   Pulse:   Temp:   Resp: 7    Level of consciousness: awake, alert  and patient cooperative  Complications: No apparent anesthesia complications

## 2015-04-19 NOTE — Anesthesia Preprocedure Evaluation (Signed)
Anesthesia Evaluation  Patient identified by MRN, date of birth, ID band Patient awake    Reviewed: Allergy & Precautions, H&P , NPO status , Patient's Chart, lab work & pertinent test results, reviewed documented beta blocker date and time   Airway Mallampati: II  TM Distance: >3 FB Neck ROM: full    Dental   Pulmonary shortness of breath, asthma , Current Smoker,          Cardiovascular + dysrhythmias + Valvular Problems/Murmurs Rate:Normal     Neuro/Psych  Headaches,  Neuromuscular disease    GI/Hepatic GERD-  ,  Endo/Other  Hypothyroidism   Renal/GU      Musculoskeletal   Abdominal   Peds  Hematology  (+) anemia ,   Anesthesia Other Findings   Reproductive/Obstetrics                             Anesthesia Physical Anesthesia Plan  ASA: III  Anesthesia Plan: General ETT   Post-op Pain Management:    Induction:   Airway Management Planned:   Additional Equipment:   Intra-op Plan:   Post-operative Plan:   Informed Consent: I have reviewed the patients History and Physical, chart, labs and discussed the procedure including the risks, benefits and alternatives for the proposed anesthesia with the patient or authorized representative who has indicated his/her understanding and acceptance.   Dental Advisory Given  Plan Discussed with: Anesthesiologist and CRNA  Anesthesia Plan Comments:         Anesthesia Quick Evaluation

## 2015-04-22 ENCOUNTER — Encounter: Payer: Self-pay | Admitting: Obstetrics & Gynecology

## 2015-04-23 ENCOUNTER — Encounter: Payer: Self-pay | Admitting: Emergency Medicine

## 2015-04-23 ENCOUNTER — Emergency Department
Admission: EM | Admit: 2015-04-23 | Discharge: 2015-04-23 | Disposition: A | Discharge status: Home / Self Care | Payer: Medicaid Other | Attending: Emergency Medicine | Admitting: Emergency Medicine

## 2015-04-23 DIAGNOSIS — F329 Major depressive disorder, single episode, unspecified: Secondary | ICD-10-CM | POA: Insufficient documentation

## 2015-04-23 DIAGNOSIS — Z3202 Encounter for pregnancy test, result negative: Secondary | ICD-10-CM | POA: Insufficient documentation

## 2015-04-23 DIAGNOSIS — F419 Anxiety disorder, unspecified: Secondary | ICD-10-CM | POA: Diagnosis not present

## 2015-04-23 DIAGNOSIS — Z91040 Latex allergy status: Secondary | ICD-10-CM | POA: Insufficient documentation

## 2015-04-23 DIAGNOSIS — N390 Urinary tract infection, site not specified: Secondary | ICD-10-CM | POA: Insufficient documentation

## 2015-04-23 DIAGNOSIS — Z79899 Other long term (current) drug therapy: Secondary | ICD-10-CM | POA: Diagnosis not present

## 2015-04-23 DIAGNOSIS — R109 Unspecified abdominal pain: Secondary | ICD-10-CM | POA: Diagnosis present

## 2015-04-23 LAB — CBC WITH DIFFERENTIAL/PLATELET
Basophils Absolute: 0 10*3/uL (ref 0–0.1)
Basophils Relative: 0 %
EOS ABS: 0 10*3/uL (ref 0–0.7)
Eosinophils Relative: 0 %
HCT: 37.3 % (ref 35.0–47.0)
Hemoglobin: 12.1 g/dL (ref 12.0–16.0)
Lymphocytes Relative: 13 %
Lymphs Abs: 1.2 10*3/uL (ref 1.0–3.6)
MCH: 29.1 pg (ref 26.0–34.0)
MCHC: 32.4 g/dL (ref 32.0–36.0)
MCV: 89.6 fL (ref 80.0–100.0)
MONOS PCT: 5 %
Monocytes Absolute: 0.4 10*3/uL (ref 0.2–0.9)
NEUTROS PCT: 82 %
Neutro Abs: 7.4 10*3/uL — ABNORMAL HIGH (ref 1.4–6.5)
PLATELETS: 204 10*3/uL (ref 150–440)
RBC: 4.16 MIL/uL (ref 3.80–5.20)
RDW: 14 % (ref 11.5–14.5)
WBC: 9 10*3/uL (ref 3.6–11.0)

## 2015-04-23 LAB — URINALYSIS COMPLETE WITH MICROSCOPIC (ARMC ONLY)
BILIRUBIN URINE: NEGATIVE
GLUCOSE, UA: NEGATIVE mg/dL
Ketones, ur: NEGATIVE mg/dL
NITRITE: POSITIVE — AB
PH: 6 (ref 5.0–8.0)
Protein, ur: 100 mg/dL — AB
Specific Gravity, Urine: 1.024 (ref 1.005–1.030)

## 2015-04-23 LAB — COMPREHENSIVE METABOLIC PANEL
ALK PHOS: 47 U/L (ref 38–126)
ALT: 33 U/L (ref 14–54)
AST: 28 U/L (ref 15–41)
Albumin: 4 g/dL (ref 3.5–5.0)
Anion gap: 7 (ref 5–15)
BUN: 13 mg/dL (ref 6–20)
CALCIUM: 8.8 mg/dL — AB (ref 8.9–10.3)
CO2: 27 mmol/L (ref 22–32)
CREATININE: 0.97 mg/dL (ref 0.44–1.00)
Chloride: 105 mmol/L (ref 101–111)
GFR calc Af Amer: >60 mL/min (ref >60)
Glucose, Bld: 105 mg/dL — ABNORMAL HIGH (ref 65–99)
Potassium: 3.7 mmol/L (ref 3.5–5.1)
Sodium: 139 mmol/L (ref 135–145)
TOTAL PROTEIN: 7.1 g/dL (ref 6.5–8.1)
Total Bilirubin: 0.5 mg/dL (ref 0.3–1.2)

## 2015-04-23 LAB — POCT PREGNANCY, URINE: PREG TEST UR: NEGATIVE

## 2015-04-23 LAB — SURGICAL PATHOLOGY

## 2015-04-23 MED ORDER — CIPROFLOXACIN HCL 500 MG PO TABS: 500.0000 mg | ORAL_TABLET | Freq: Two times a day (BID) | ORAL | Status: AC

## 2015-04-23 MED ORDER — POLYETHYLENE GLYCOL 3350 17 G PO PACK: 17.0000 g | PACK | Freq: Every day | ORAL | Status: DC

## 2015-04-23 MED ORDER — DOCUSATE SODIUM 100 MG PO CAPS: 100.0000 mg | ORAL_CAPSULE | Freq: Two times a day (BID) | ORAL | Status: DC
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
## 2015-04-23 NOTE — ED Notes (Signed)
MD at bedside.  Assessing patient at this time.

## 2015-04-23 NOTE — ED Notes (Signed)
Patient presents to ED with complaints of abdominal pain, constipation, and dysuria. Reports having surgery last Thursday for ovarian cyst removal. Reports taking Percocet for pain. No acute distress noted.
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
## 2015-04-23 NOTE — ED Notes (Signed)
Patient unable to void at this time

## 2015-04-23 NOTE — Discharge Instructions (Signed)

## 2015-05-06 ENCOUNTER — Emergency Department: Payer: Medicaid Other

## 2015-05-06 DIAGNOSIS — N99820 Postprocedural hemorrhage and hematoma of a genitourinary system organ or structure following a genitourinary system procedure: Secondary | ICD-10-CM | POA: Insufficient documentation

## 2015-05-06 DIAGNOSIS — Y836 Removal of other organ (partial) (total) as the cause of abnormal reaction of the patient, or of later complication, without mention of misadventure at the time of the procedure: Secondary | ICD-10-CM | POA: Insufficient documentation

## 2015-05-06 DIAGNOSIS — G8918 Other acute postprocedural pain: Secondary | ICD-10-CM | POA: Diagnosis not present

## 2015-05-06 LAB — COMPREHENSIVE METABOLIC PANEL
ALT: 22 U/L (ref 14–54)
ANION GAP: 7 (ref 5–15)
AST: 18 U/L (ref 15–41)
Albumin: 4.1 g/dL (ref 3.5–5.0)
Alkaline Phosphatase: 40 U/L (ref 38–126)
BILIRUBIN TOTAL: 0.4 mg/dL (ref 0.3–1.2)
BUN: 5 mg/dL — ABNORMAL LOW (ref 6–20)
CALCIUM: 9.4 mg/dL (ref 8.9–10.3)
CO2: 27 mmol/L (ref 22–32)
Chloride: 105 mmol/L (ref 101–111)
Creatinine, Ser: 0.92 mg/dL (ref 0.44–1.00)
GFR calc Af Amer: >60 mL/min (ref >60)
GLUCOSE: 88 mg/dL (ref 65–99)
POTASSIUM: 3.8 mmol/L (ref 3.5–5.1)
Sodium: 139 mmol/L (ref 135–145)
Total Protein: 7.2 g/dL (ref 6.5–8.1)

## 2015-05-06 LAB — CBC WITH DIFFERENTIAL/PLATELET
Basophils Absolute: 0 10*3/uL (ref 0–0.1)
Basophils Relative: 0 %
EOS ABS: 0 10*3/uL (ref 0–0.7)
Eosinophils Relative: 0 %
HEMATOCRIT: 36.8 % (ref 35.0–47.0)
Hemoglobin: 12.2 g/dL (ref 12.0–16.0)
LYMPHS PCT: 25 %
Lymphs Abs: 2 10*3/uL (ref 1.0–3.6)
MCH: 29.2 pg (ref 26.0–34.0)
MCHC: 33 g/dL (ref 32.0–36.0)
MCV: 88.5 fL (ref 80.0–100.0)
Monocytes Absolute: 0.6 10*3/uL (ref 0.2–0.9)
Monocytes Relative: 7 %
NEUTROS ABS: 5.3 10*3/uL (ref 1.4–6.5)
NEUTROS PCT: 68 %
Platelets: 220 10*3/uL (ref 150–440)
RBC: 4.16 MIL/uL (ref 3.80–5.20)
RDW: 14.3 % (ref 11.5–14.5)
WBC: 8 10*3/uL (ref 3.6–11.0)

## 2015-05-06 LAB — PREGNANCY, URINE: Preg Test, Ur: NEGATIVE

## 2015-05-06 NOTE — ED Notes (Signed)
Pt presents to ER stating she recently had an ovarian cyst removed 5/12 and has had vaginal bleeding since surgery. Pt reports she wants it checked out and is also having right side pain.

## 2015-05-07 ENCOUNTER — Emergency Department
Admission: EM | Admit: 2015-05-07 | Discharge: 2015-05-07 | Discharge status: Against Medical Advice | Payer: Medicaid Other | Attending: Emergency Medicine | Admitting: Emergency Medicine

## 2015-05-07 DIAGNOSIS — R52 Pain, unspecified: Secondary | ICD-10-CM

## 2015-06-06 ENCOUNTER — Other Ambulatory Visit: Payer: Self-pay | Admitting: Internal Medicine

## 2015-07-19 ENCOUNTER — Other Ambulatory Visit: Payer: Self-pay | Admitting: *Deleted

## 2015-07-19 DIAGNOSIS — C73 Malignant neoplasm of thyroid gland: Secondary | ICD-10-CM

## 2015-07-20 ENCOUNTER — Inpatient Hospital Stay: Payer: Medicaid Other | Admitting: Internal Medicine

## 2015-07-20 ENCOUNTER — Inpatient Hospital Stay: Payer: Medicaid Other | Attending: Internal Medicine

## 2015-08-05 ENCOUNTER — Emergency Department
Admission: EM | Admit: 2015-08-05 | Discharge: 2015-08-05 | Disposition: A | Discharge status: Home / Self Care | Payer: Medicaid Other | Attending: Emergency Medicine | Admitting: Emergency Medicine

## 2015-08-05 ENCOUNTER — Encounter: Payer: Self-pay | Admitting: *Deleted

## 2015-08-05 ENCOUNTER — Emergency Department: Payer: Medicaid Other

## 2015-08-05 DIAGNOSIS — Z3202 Encounter for pregnancy test, result negative: Secondary | ICD-10-CM | POA: Diagnosis not present

## 2015-08-05 DIAGNOSIS — M545 Low back pain, unspecified: Secondary | ICD-10-CM

## 2015-08-05 DIAGNOSIS — Z91040 Latex allergy status: Secondary | ICD-10-CM | POA: Diagnosis not present

## 2015-08-05 DIAGNOSIS — Z79899 Other long term (current) drug therapy: Secondary | ICD-10-CM | POA: Insufficient documentation

## 2015-08-05 LAB — URINALYSIS COMPLETE WITH MICROSCOPIC (ARMC ONLY)
Bilirubin Urine: NEGATIVE
GLUCOSE, UA: NEGATIVE mg/dL
HGB URINE DIPSTICK: NEGATIVE
Ketones, ur: NEGATIVE mg/dL
Leukocytes, UA: NEGATIVE
Nitrite: NEGATIVE
Protein, ur: NEGATIVE mg/dL
Specific Gravity, Urine: 1.006 (ref 1.005–1.030)
pH: 7 (ref 5.0–8.0)

## 2015-08-05 LAB — POCT PREGNANCY, URINE: PREG TEST UR: NEGATIVE

## 2015-08-05 MED ORDER — ACETAMINOPHEN ER 650 MG PO TBCR: 650.0000 mg | EXTENDED_RELEASE_TABLET | Freq: Three times a day (TID) | ORAL | Status: DC | PRN
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
## 2015-08-05 NOTE — ED Notes (Signed)
Pt has chronic back pain, pt reports increased pain

## 2015-08-15 ENCOUNTER — Other Ambulatory Visit: Payer: Self-pay | Admitting: Vascular Surgery

## 2015-08-15 ENCOUNTER — Ambulatory Visit
Admission: RE | Admit: 2015-08-15 | Discharge: 2015-08-15 | Disposition: A | Discharge status: Home / Self Care | Payer: Medicaid Other | Source: Ambulatory Visit | Attending: Vascular Surgery | Admitting: Vascular Surgery

## 2015-08-15 DIAGNOSIS — Q765 Cervical rib: Secondary | ICD-10-CM

## 2015-08-15 DIAGNOSIS — Z85850 Personal history of malignant neoplasm of thyroid: Secondary | ICD-10-CM | POA: Insufficient documentation

## 2015-08-15 DIAGNOSIS — R079 Chest pain, unspecified: Secondary | ICD-10-CM | POA: Insufficient documentation

## 2015-08-24 ENCOUNTER — Other Ambulatory Visit: Payer: Self-pay | Admitting: Rheumatology

## 2015-08-24 DIAGNOSIS — M25512 Pain in left shoulder: Secondary | ICD-10-CM

## 2015-08-27 ENCOUNTER — Emergency Department
Admission: EM | Admit: 2015-08-27 | Discharge: 2015-08-27 | Disposition: A | Discharge status: Home / Self Care | Payer: Medicaid Other | Attending: Student | Admitting: Student

## 2015-08-27 ENCOUNTER — Encounter: Payer: Self-pay | Admitting: Emergency Medicine

## 2015-08-27 DIAGNOSIS — R202 Paresthesia of skin: Secondary | ICD-10-CM | POA: Diagnosis not present

## 2015-08-27 DIAGNOSIS — M25512 Pain in left shoulder: Secondary | ICD-10-CM | POA: Diagnosis present

## 2015-08-27 DIAGNOSIS — Z79899 Other long term (current) drug therapy: Secondary | ICD-10-CM | POA: Insufficient documentation

## 2015-08-27 DIAGNOSIS — Z91040 Latex allergy status: Secondary | ICD-10-CM | POA: Diagnosis not present

## 2015-08-27 NOTE — ED Notes (Signed)
States she had injection to left shoulder on  Thursday  Has had different pain since   With nausea

## 2015-08-27 NOTE — ED Notes (Signed)
Reports RA is acting up.  Pain in left shoulder.  NAD
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
## 2015-09-03 ENCOUNTER — Ambulatory Visit
Admission: RE | Admit: 2015-09-03 | Discharge: 2015-09-03 | Disposition: A | Discharge status: Home / Self Care | Payer: Medicaid Other | Source: Ambulatory Visit | Attending: Rheumatology | Admitting: Rheumatology

## 2015-09-03 DIAGNOSIS — M7552 Bursitis of left shoulder: Secondary | ICD-10-CM | POA: Diagnosis not present

## 2015-09-03 DIAGNOSIS — M25512 Pain in left shoulder: Secondary | ICD-10-CM

## 2015-09-03 DIAGNOSIS — M7582 Other shoulder lesions, left shoulder: Secondary | ICD-10-CM | POA: Diagnosis not present

## 2015-09-03 DIAGNOSIS — G8929 Other chronic pain: Secondary | ICD-10-CM | POA: Diagnosis present
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
## 2015-10-07 ENCOUNTER — Emergency Department
Admission: EM | Admit: 2015-10-07 | Discharge: 2015-10-07 | Disposition: A | Discharge status: Home / Self Care | Payer: Medicaid Other | Attending: Emergency Medicine | Admitting: Emergency Medicine

## 2015-10-07 ENCOUNTER — Encounter: Payer: Self-pay | Admitting: Emergency Medicine

## 2015-10-07 DIAGNOSIS — Z91040 Latex allergy status: Secondary | ICD-10-CM | POA: Insufficient documentation

## 2015-10-07 DIAGNOSIS — Z79899 Other long term (current) drug therapy: Secondary | ICD-10-CM | POA: Diagnosis not present

## 2015-10-07 DIAGNOSIS — Z7951 Long term (current) use of inhaled steroids: Secondary | ICD-10-CM | POA: Diagnosis not present

## 2015-10-07 DIAGNOSIS — F419 Anxiety disorder, unspecified: Secondary | ICD-10-CM | POA: Insufficient documentation

## 2015-10-07 LAB — COMPREHENSIVE METABOLIC PANEL
ALK PHOS: 46 U/L (ref 38–126)
ALT: 14 U/L (ref 14–54)
ANION GAP: 5 (ref 5–15)
AST: 18 U/L (ref 15–41)
Albumin: 4.2 g/dL (ref 3.5–5.0)
BUN: 9 mg/dL (ref 6–20)
CALCIUM: 9.2 mg/dL (ref 8.9–10.3)
CO2: 28 mmol/L (ref 22–32)
Chloride: 107 mmol/L (ref 101–111)
Creatinine, Ser: 0.92 mg/dL (ref 0.44–1.00)
GFR calc non Af Amer: >60 mL/min (ref >60)
Glucose, Bld: 92 mg/dL (ref 65–99)
POTASSIUM: 4.2 mmol/L (ref 3.5–5.1)
SODIUM: 140 mmol/L (ref 135–145)
Total Bilirubin: 0.6 mg/dL (ref 0.3–1.2)
Total Protein: 7.8 g/dL (ref 6.5–8.1)

## 2015-10-07 LAB — CBC WITH DIFFERENTIAL/PLATELET
BASOS PCT: 1 %
Basophils Absolute: 0 10*3/uL (ref 0–0.1)
EOS ABS: 0 10*3/uL (ref 0–0.7)
Eosinophils Relative: 1 %
HCT: 38.9 % (ref 35.0–47.0)
HEMOGLOBIN: 12.6 g/dL (ref 12.0–16.0)
LYMPHS ABS: 1.4 10*3/uL (ref 1.0–3.6)
Lymphocytes Relative: 26 %
MCH: 28.5 pg (ref 26.0–34.0)
MCHC: 32.4 g/dL (ref 32.0–36.0)
MCV: 88.1 fL (ref 80.0–100.0)
Monocytes Absolute: 0.4 10*3/uL (ref 0.2–0.9)
Monocytes Relative: 7 %
NEUTROS PCT: 65 %
Neutro Abs: 3.7 10*3/uL (ref 1.4–6.5)
Platelets: 239 10*3/uL (ref 150–440)
RBC: 4.41 MIL/uL (ref 3.80–5.20)
RDW: 14.4 % (ref 11.5–14.5)
WBC: 5.6 10*3/uL (ref 3.6–11.0)

## 2015-10-07 LAB — URINALYSIS COMPLETE WITH MICROSCOPIC (ARMC ONLY)
BACTERIA UA: NONE SEEN
Bilirubin Urine: NEGATIVE
Glucose, UA: NEGATIVE mg/dL
Hgb urine dipstick: NEGATIVE
KETONES UR: NEGATIVE mg/dL
Leukocytes, UA: NEGATIVE
Nitrite: NEGATIVE
PH: 7 (ref 5.0–8.0)
PROTEIN: NEGATIVE mg/dL
Specific Gravity, Urine: 1.016 (ref 1.005–1.030)

## 2015-10-07 LAB — URINE DRUG SCREEN, QUALITATIVE (ARMC ONLY)
AMPHETAMINES, UR SCREEN: NOT DETECTED
Barbiturates, Ur Screen: NOT DETECTED
Benzodiazepine, Ur Scrn: NOT DETECTED
CANNABINOID 50 NG, UR ~~LOC~~: NOT DETECTED
COCAINE METABOLITE, UR ~~LOC~~: NOT DETECTED
MDMA (ECSTASY) UR SCREEN: NOT DETECTED
Methadone Scn, Ur: NOT DETECTED
OPIATE, UR SCREEN: NOT DETECTED
PHENCYCLIDINE (PCP) UR S: NOT DETECTED
TRICYCLIC, UR SCREEN: NOT DETECTED

## 2015-10-07 LAB — ETHANOL: Alcohol, Ethyl (B): <5 mg/dL (ref <5)

## 2015-10-07 LAB — TSH: TSH: 3.579 u[IU]/mL (ref 0.350–4.500)

## 2015-10-07 MED ORDER — LORAZEPAM 0.5 MG PO TABS
0.5000 mg | ORAL_TABLET | Freq: Once | ORAL | Status: AC
  Administered 2015-10-07: 0.5 mg via ORAL
  Filled 2015-10-07: qty 1

## 2015-10-07 MED ORDER — LORAZEPAM 0.5 MG PO TABS: 0.5000 mg | ORAL_TABLET | Freq: Three times a day (TID) | ORAL | Status: DC | PRN

## 2015-10-07 NOTE — ED Notes (Signed)

## 2015-10-07 NOTE — ED Notes (Signed)
NAD noted at this time. Pt alert and oriented in the bed. Respirations are even and unlabored at this time.

## 2015-10-07 NOTE — ED Notes (Signed)
NAD Noted at this time. Pt resting in bed eating a lunch tray at this time. Respirations even and unlabored.
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
## 2015-10-07 NOTE — ED Notes (Signed)
Brought in via PD   States she has been very anxious lately  Denies any si/hi

## 2015-10-07 NOTE — ED Notes (Signed)
NAD noted at this time. Pt resting in bed at this time. Respirations noted to be even and unlabored at this time.

## 2015-10-07 NOTE — ED Notes (Signed)
BEHAVIORAL HEALTH ROUNDING Patient sleeping: No. Patient alert and oriented: yes Behavior appropriate: Yes.  ; If no, describe:  Nutrition and fluids offered: No and Pt just arrived to the unit.  Toileting and hygiene offered: No and Pt just arrived Sitter present: yes Law enforcement present: Yes ODS

## 2015-10-07 NOTE — ED Notes (Signed)
Pt denies SI/HI, denies VH/AH at this time.

## 2015-10-07 NOTE — ED Notes (Signed)
Patient assigned to appropriate care area. Patient oriented to unit/care area: Informed that, for their safety, care areas are designed for safety and monitored by security cameras at all times; and visiting hours explained to patient. Patient verbalizes understanding, and verbal contract for safety obtained. 

## 2015-10-08 ENCOUNTER — Ambulatory Visit: Payer: Medicaid Other | Attending: Family Medicine | Admitting: Physical Therapy

## 2015-10-08 DIAGNOSIS — M25519 Pain in unspecified shoulder: Secondary | ICD-10-CM | POA: Diagnosis present

## 2015-10-08 DIAGNOSIS — G8929 Other chronic pain: Secondary | ICD-10-CM | POA: Insufficient documentation
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
## 2015-10-15 ENCOUNTER — Encounter: Payer: Medicaid Other | Admitting: Physical Therapy

## 2015-10-18 ENCOUNTER — Emergency Department
Admission: EM | Admit: 2015-10-18 | Discharge: 2015-10-18 | Disposition: A | Discharge status: Home / Self Care | Payer: Medicaid Other | Attending: Emergency Medicine | Admitting: Emergency Medicine

## 2015-10-18 ENCOUNTER — Encounter: Payer: Self-pay | Admitting: *Deleted

## 2015-10-18 ENCOUNTER — Emergency Department: Payer: Medicaid Other

## 2015-10-18 DIAGNOSIS — M25551 Pain in right hip: Secondary | ICD-10-CM | POA: Insufficient documentation

## 2015-10-18 DIAGNOSIS — Z3202 Encounter for pregnancy test, result negative: Secondary | ICD-10-CM | POA: Insufficient documentation

## 2015-10-18 DIAGNOSIS — Z79899 Other long term (current) drug therapy: Secondary | ICD-10-CM | POA: Diagnosis not present

## 2015-10-18 DIAGNOSIS — G8929 Other chronic pain: Secondary | ICD-10-CM | POA: Insufficient documentation

## 2015-10-18 DIAGNOSIS — M2480 Other specific joint derangements of unspecified joint, not elsewhere classified: Secondary | ICD-10-CM

## 2015-10-18 DIAGNOSIS — Z7952 Long term (current) use of systemic steroids: Secondary | ICD-10-CM | POA: Diagnosis not present

## 2015-10-18 DIAGNOSIS — M25552 Pain in left hip: Secondary | ICD-10-CM | POA: Diagnosis present

## 2015-10-18 DIAGNOSIS — Z91040 Latex allergy status: Secondary | ICD-10-CM | POA: Insufficient documentation

## 2015-10-18 DIAGNOSIS — M25559 Pain in unspecified hip: Secondary | ICD-10-CM

## 2015-10-18 DIAGNOSIS — Z791 Long term (current) use of non-steroidal anti-inflammatories (NSAID): Secondary | ICD-10-CM | POA: Diagnosis not present

## 2015-10-18 DIAGNOSIS — M357 Hypermobility syndrome: Secondary | ICD-10-CM | POA: Diagnosis not present

## 2015-10-18 LAB — URINALYSIS COMPLETE WITH MICROSCOPIC (ARMC ONLY)
Bacteria, UA: NONE SEEN
Bilirubin Urine: NEGATIVE
Glucose, UA: NEGATIVE mg/dL
HGB URINE DIPSTICK: NEGATIVE
KETONES UR: NEGATIVE mg/dL
LEUKOCYTES UA: NEGATIVE
NITRITE: NEGATIVE
PROTEIN: NEGATIVE mg/dL
SPECIFIC GRAVITY, URINE: 1.01 (ref 1.005–1.030)
WBC UA: NONE SEEN WBC/hpf (ref 0–5)
pH: 7 (ref 5.0–8.0)

## 2015-10-18 LAB — POCT PREGNANCY, URINE: PREG TEST UR: NEGATIVE

## 2015-10-18 MED ORDER — DIAZEPAM 2 MG PO TABS: 2.0000 mg | ORAL_TABLET | Freq: Two times a day (BID) | ORAL | Status: DC | PRN

## 2015-10-18 MED ORDER — ORPHENADRINE CITRATE 30 MG/ML IJ SOLN
60.0000 mg | INTRAMUSCULAR | Status: AC
  Administered 2015-10-18: 60 mg via INTRAMUSCULAR
  Filled 2015-10-18: qty 2

## 2015-10-18 MED ORDER — PREDNISONE 10 MG PO TABS: 10.0000 mg | ORAL_TABLET | Freq: Two times a day (BID) | ORAL | Status: DC

## 2015-10-18 MED ORDER — KETOROLAC TROMETHAMINE 60 MG/2ML IM SOLN
60.0000 mg | Freq: Once | INTRAMUSCULAR | Status: AC
  Administered 2015-10-18: 60 mg via INTRAMUSCULAR
  Filled 2015-10-18: qty 2

## 2015-10-18 NOTE — ED Notes (Signed)
Pt has bilateral chronic hip pain, pt reports increased pain in hips today, pt reports pain medication is not working
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
## 2015-10-18 NOTE — Discharge Instructions (Signed)
Hip Pain Your hip is the joint between your upper legs and your lower pelvis. The bones, cartilage, tendons, and muscles of your hip joint perform a lot of work each day supporting your body weight and allowing you to move around. Hip pain can range from a minor ache to severe pain in one or both of your hips. Pain may be felt on the inside of the hip joint near the groin, or the outside near the buttocks and upper thigh. You may have swelling or stiffness as well.  HOME CARE INSTRUCTIONS   Take medicines only as directed by your health care provider.  Apply ice to the injured area:  Put ice in a plastic bag.  Place a towel between your skin and the bag.  Leave the ice on for 15-20 minutes at a time, 3-4 times a day.  Keep your leg raised (elevated) when possible to lessen swelling.  Avoid activities that cause pain.  Follow specific exercises as directed by your health care provider.  Sleep with a pillow between your legs on your most comfortable side.  Record how often you have hip pain, the location of the pain, and what it feels like. SEEK MEDICAL CARE IF:   You are unable to put weight on your leg.  Your hip is red or swollen or very tender to touch.  Your pain or swelling continues or worsens after 1 week.  You have increasing difficulty walking.  You have a fever. SEEK IMMEDIATE MEDICAL CARE IF:   You have fallen.  You have a sudden increase in pain and swelling in your hip. MAKE SURE YOU:   Understand these instructions.  Will watch your condition.  Will get help right away if you are not doing well or get worse.   This information is not intended to replace advice given to you by your health care provider. Make sure you discuss any questions you have with your health care provider.   Document Released: 05/14/2010 Document Revised: 12/15/2014 Document Reviewed: 07/21/2013 Elsevier Interactive Patient Education [employer].  Your exam and x-rays do  not show any severe bony changes to your hips today.  You should take the prescription meds as directed. Follow-up with Dr. Mack Guise or return to [facility] for a referral to a new specialists.

## 2015-10-19 ENCOUNTER — Encounter: Payer: Medicaid Other | Admitting: Physical Therapy

## 2015-10-22 ENCOUNTER — Encounter: Payer: Medicaid Other | Admitting: Physical Therapy

## 2015-10-26 ENCOUNTER — Ambulatory Visit: Payer: Medicaid Other | Admitting: Physical Therapy

## 2015-12-11 ENCOUNTER — Other Ambulatory Visit: Payer: Self-pay | Admitting: *Deleted

## 2015-12-12 ENCOUNTER — Telehealth: Payer: Self-pay | Admitting: *Deleted

## 2015-12-12 NOTE — Telephone Encounter (Signed)
rcvd fax from pharmacy yest.  Triage had sent it back stating pt has not been here for over a year and pt will need to see PCP or call and get appt at cancer center.  I called pt on then number provided on her chart and left her a message to see if she needs the refill and if she is following up with PCP or endocrinologist.  If she needs appt back at cancer center because she missed her lab appt and f/u appt with Dr. Ma Hillock and now Dr. Ma Hillock no longer practicing at this clinic.  Asked her to call me back. And left my number

## 2016-01-17 ENCOUNTER — Ambulatory Visit: Payer: Medicaid Other | Admitting: Pain Medicine

## 2016-01-31 ENCOUNTER — Encounter (INDEPENDENT_AMBULATORY_CARE_PROVIDER_SITE_OTHER): Payer: Self-pay

## 2016-01-31 ENCOUNTER — Encounter: Payer: Self-pay | Admitting: Pain Medicine

## 2016-01-31 ENCOUNTER — Ambulatory Visit: Payer: Medicaid Other | Attending: Pain Medicine | Admitting: Pain Medicine

## 2016-01-31 VITALS — BP 122/84 | HR 77 | Temp 97.4°F | Resp 18 | Ht 66.0 in | Wt 137.0 lb

## 2016-01-31 DIAGNOSIS — G588 Other specified mononeuropathies: Secondary | ICD-10-CM | POA: Diagnosis not present

## 2016-01-31 DIAGNOSIS — M797 Fibromyalgia: Secondary | ICD-10-CM | POA: Diagnosis not present

## 2016-01-31 DIAGNOSIS — M1610 Unilateral primary osteoarthritis, unspecified hip: Secondary | ICD-10-CM | POA: Insufficient documentation

## 2016-01-31 DIAGNOSIS — N809 Endometriosis, unspecified: Secondary | ICD-10-CM | POA: Insufficient documentation

## 2016-01-31 DIAGNOSIS — G5790 Unspecified mononeuropathy of unspecified lower limb: Secondary | ICD-10-CM

## 2016-01-31 DIAGNOSIS — M778 Other enthesopathies, not elsewhere classified: Secondary | ICD-10-CM | POA: Diagnosis not present

## 2016-01-31 DIAGNOSIS — M19019 Primary osteoarthritis, unspecified shoulder: Secondary | ICD-10-CM | POA: Diagnosis not present

## 2016-01-31 DIAGNOSIS — M19012 Primary osteoarthritis, left shoulder: Secondary | ICD-10-CM

## 2016-01-31 DIAGNOSIS — M779 Enthesopathy, unspecified: Secondary | ICD-10-CM | POA: Diagnosis not present

## 2016-01-31 DIAGNOSIS — M7060 Trochanteric bursitis, unspecified hip: Secondary | ICD-10-CM | POA: Diagnosis not present

## 2016-01-31 DIAGNOSIS — R51 Headache: Secondary | ICD-10-CM | POA: Diagnosis present

## 2016-01-31 DIAGNOSIS — M533 Sacrococcygeal disorders, not elsewhere classified: Secondary | ICD-10-CM | POA: Insufficient documentation

## 2016-01-31 DIAGNOSIS — M542 Cervicalgia: Secondary | ICD-10-CM

## 2016-01-31 DIAGNOSIS — M5481 Occipital neuralgia: Secondary | ICD-10-CM

## 2016-01-31 DIAGNOSIS — Z85850 Personal history of malignant neoplasm of thyroid: Secondary | ICD-10-CM | POA: Diagnosis not present

## 2016-01-31 DIAGNOSIS — R5382 Chronic fatigue, unspecified: Secondary | ICD-10-CM | POA: Diagnosis not present

## 2016-01-31 DIAGNOSIS — M19011 Primary osteoarthritis, right shoulder: Secondary | ICD-10-CM

## 2016-01-31 DIAGNOSIS — M7918 Myalgia, other site: Secondary | ICD-10-CM

## 2016-01-31 NOTE — Progress Notes (Signed)
Safety precautions to be maintained throughout the outpatient stay will include: orient to surroundings, keep bed in low position, maintain call bell within reach at all times, provide assistance with transfer out of bed and ambulation.  
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
## 2016-01-31 NOTE — Patient Instructions (Addendum)
Continue present medication  Shoulder injection to be performed at time of return appointment  F/U PCP Dr. Sabino Snipes  for evaliation of  BP and general medical  Condition  Rheumatological evaluation as discussed  Psych follow-up evaluation as scheduled  Vascular follow-up evaluation as needed  F/U surgical evaluation. May consider pending follow-up evaluations  F/U neurological evaluation. May consider pending follow-up evaluations  May consider radiofrequency rhizolysis or intraspinal procedures pending response to present treatment and F/U evaluation   Patient to call Pain Management Center should patient have concerns prior to scheduled return appointment.  Do not eat or drink 2 hours before your injection.

## 2016-02-05 ENCOUNTER — Other Ambulatory Visit: Payer: Self-pay

## 2016-02-11 ENCOUNTER — Ambulatory Visit: Payer: Medicaid Other | Admitting: Pain Medicine

## 2016-02-14 ENCOUNTER — Other Ambulatory Visit: Payer: Self-pay | Admitting: Pain Medicine

## 2016-02-25 ENCOUNTER — Encounter: Payer: Self-pay | Admitting: Emergency Medicine

## 2016-02-25 ENCOUNTER — Emergency Department
Admission: EM | Admit: 2016-02-25 | Discharge: 2016-02-25 | Disposition: A | Discharge status: Home / Self Care | Payer: Medicaid Other | Attending: Emergency Medicine | Admitting: Emergency Medicine

## 2016-02-25 ENCOUNTER — Emergency Department: Payer: Medicaid Other

## 2016-02-25 DIAGNOSIS — J45909 Unspecified asthma, uncomplicated: Secondary | ICD-10-CM | POA: Diagnosis not present

## 2016-02-25 DIAGNOSIS — F4310 Post-traumatic stress disorder, unspecified: Secondary | ICD-10-CM | POA: Diagnosis not present

## 2016-02-25 DIAGNOSIS — F329 Major depressive disorder, single episode, unspecified: Secondary | ICD-10-CM | POA: Diagnosis not present

## 2016-02-25 DIAGNOSIS — E039 Hypothyroidism, unspecified: Secondary | ICD-10-CM | POA: Diagnosis not present

## 2016-02-25 DIAGNOSIS — Z79899 Other long term (current) drug therapy: Secondary | ICD-10-CM | POA: Insufficient documentation

## 2016-02-25 DIAGNOSIS — C73 Malignant neoplasm of thyroid gland: Secondary | ICD-10-CM | POA: Insufficient documentation

## 2016-02-25 DIAGNOSIS — G8929 Other chronic pain: Secondary | ICD-10-CM

## 2016-02-25 DIAGNOSIS — M545 Low back pain, unspecified: Secondary | ICD-10-CM

## 2016-02-25 DIAGNOSIS — Z79891 Long term (current) use of opiate analgesic: Secondary | ICD-10-CM | POA: Insufficient documentation

## 2016-02-25 DIAGNOSIS — I341 Nonrheumatic mitral (valve) prolapse: Secondary | ICD-10-CM | POA: Insufficient documentation

## 2016-02-25 DIAGNOSIS — E785 Hyperlipidemia, unspecified: Secondary | ICD-10-CM | POA: Diagnosis not present

## 2016-02-25 LAB — POCT PREGNANCY, URINE: PREG TEST UR: NEGATIVE

## 2016-02-25 MED ORDER — KETOROLAC TROMETHAMINE 10 MG PO TABS: 10.0000 mg | ORAL_TABLET | Freq: Four times a day (QID) | ORAL | Status: DC | PRN

## 2016-02-25 MED ORDER — CYCLOBENZAPRINE HCL 5 MG PO TABS: 5.0000 mg | ORAL_TABLET | Freq: Three times a day (TID) | ORAL | Status: DC | PRN

## 2016-02-25 MED ORDER — KETOROLAC TROMETHAMINE 60 MG/2ML IM SOLN
60.0000 mg | Freq: Once | INTRAMUSCULAR | Status: AC
  Administered 2016-02-25: 60 mg via INTRAMUSCULAR
  Filled 2016-02-25: qty 2

## 2016-02-25 MED ORDER — ETODOLAC 400 MG PO TABS: 400.0000 mg | ORAL_TABLET | Freq: Two times a day (BID) | ORAL | Status: DC

## 2016-02-25 MED ORDER — CYCLOBENZAPRINE HCL 10 MG PO TABS
5.0000 mg | ORAL_TABLET | Freq: Once | ORAL | Status: AC
  Administered 2016-02-25: 5 mg via ORAL
  Filled 2016-02-25: qty 1
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
## 2016-02-25 NOTE — Discharge Instructions (Signed)
Follow-up with Dr. Gwynneth Aliment. Call make an appointment today to be seen if any continued back problems. Also keep your appointment with Dr. crisp in 2 weeks for your chronic back pain. Begin taking Toradol one every 6 hours with food as needed for inflammation and Flexeril 5 mg 3 times a day if needed for muscle spasms. You may also use ice or heat to her back as needed for muscle spasms and discomfort.

## 2016-02-25 NOTE — ED Notes (Signed)
Pt reports chronic back pain, today radiating into left hip

## 2016-03-05 ENCOUNTER — Encounter: Payer: Self-pay | Admitting: Pain Medicine

## 2016-03-05 ENCOUNTER — Ambulatory Visit: Payer: Medicaid Other | Attending: Pain Medicine | Admitting: Pain Medicine

## 2016-03-05 VITALS — BP 127/76 | HR 81 | Temp 97.8°F | Resp 16 | Ht 66.5 in | Wt 148.0 lb

## 2016-03-05 DIAGNOSIS — M5481 Occipital neuralgia: Secondary | ICD-10-CM

## 2016-03-05 DIAGNOSIS — M19019 Primary osteoarthritis, unspecified shoulder: Secondary | ICD-10-CM | POA: Diagnosis not present

## 2016-03-05 DIAGNOSIS — M542 Cervicalgia: Secondary | ICD-10-CM

## 2016-03-05 DIAGNOSIS — M19012 Primary osteoarthritis, left shoulder: Secondary | ICD-10-CM

## 2016-03-05 DIAGNOSIS — M533 Sacrococcygeal disorders, not elsewhere classified: Secondary | ICD-10-CM

## 2016-03-05 DIAGNOSIS — M25512 Pain in left shoulder: Secondary | ICD-10-CM | POA: Diagnosis present

## 2016-03-05 DIAGNOSIS — M7918 Myalgia, other site: Secondary | ICD-10-CM

## 2016-03-05 DIAGNOSIS — G5790 Unspecified mononeuropathy of unspecified lower limb: Secondary | ICD-10-CM

## 2016-03-05 DIAGNOSIS — M797 Fibromyalgia: Secondary | ICD-10-CM

## 2016-03-05 DIAGNOSIS — M19011 Primary osteoarthritis, right shoulder: Secondary | ICD-10-CM

## 2016-03-05 MED ORDER — TRIAMCINOLONE ACETONIDE 40 MG/ML IJ SUSP: 40.0000 mg | Freq: Once | INTRAMUSCULAR | Status: DC

## 2016-03-05 MED ORDER — BUPIVACAINE HCL (PF) 0.25 % IJ SOLN: 30.0000 mL | Freq: Once | INTRAMUSCULAR | Status: DC

## 2016-03-05 MED ORDER — ORPHENADRINE CITRATE 30 MG/ML IJ SOLN
INTRAMUSCULAR | Status: AC
  Filled 2016-03-05: qty 2

## 2016-03-05 MED ORDER — BUPIVACAINE HCL (PF) 0.25 % IJ SOLN
INTRAMUSCULAR | Status: DC
Start: 2016-03-05 — End: 2016-03-05
  Filled 2016-03-05: qty 30

## 2016-03-05 MED ORDER — TRIAMCINOLONE ACETONIDE 40 MG/ML IJ SUSP
INTRAMUSCULAR | Status: AC
  Filled 2016-03-05: qty 1

## 2016-03-05 MED ORDER — ORPHENADRINE CITRATE 30 MG/ML IJ SOLN: 60.0000 mg | Freq: Once | INTRAMUSCULAR | Status: DC

## 2016-03-05 NOTE — Progress Notes (Signed)
   Subjective:    Patient ID: Kristina Li, female    DOB: [birth year], 35 y.o.   MRN: [id number]  HPI                                                  LEFT SHOULDER INJECTION    The patient is a 35 -year-old female who returns to pain management for further evaluation and treatment of pain involving the region of the left shoulder. The patient is with prior studies revealing patient to be with degenerative changes of the shoulder.. Decision has been made to proceed with interventional treatment of the left shoulder in attempt to decrease severity of patient's symptoms, minimize progression of patient's symptoms, and avoid the need for more involved treatment. The risks benefits and expectations of procedure have been discussed and explained to patient who is with understanding and wishes to proceed with procedure in attempt to decrease severity of symptoms, minimize progression of symptoms, and avoid the need for more involved treatment are in agreement to proceed with shoulder injection as planned    Description Of Procedure:  Left Shoulder Injection (Anterior Approach)  With the patient in sitting position with EKG, blood pressure, pulse, and pulse oximetry monitoring all in place, Betadine prep of proposed entry site was accomplished following identification of landmarks for left shoulder injection anterior approach. Following identification of landmarks for shoulder injection anterior approach, a 22-gauge needle was inserted in the anterior region of the left shoulder and 2 cc of 0.25% bupivacaine with Kenalog was injected for left shoulder injection anterior approach. The patient tolerated the injection well  Description Of Procedure  Left Shoulder Injection (Posterior Approach)  With the patient in the sitting position with EKG, blood pressure, pulse, and pulse oximetry monitoring all in place, Betadine prep of proposed entry site was accomplished following identification of  landmarks for left shoulder injection posterior approach. Following identification of landmarks for shoulder injection posterior approach, a 22-gauge needle was inserted in the region of the left shoulder and 2 cc of 0.25% bupivacaine with Kenalog was injected for left shoulder injection posterior approach. The patient tolerated the injection well.  Myoneural block injections of the trapezius musculature region Following Betadine prep of proposed entry site a 22-gauge needle was inserted in the trapezius musculature region and following negative aspiration 2 cc of 0.25% bupivacaine was injected into trapezius musculature region 4  The patient tolerated the procedure well.  A total of 20 mg of Kenalog was utilized for the entire procedure.    PLAN:     Continue present medication  F/U PCP Dr.Meridith Soles  for evaliation of  BP and general medical  condition  F/U surgical evaluation. May consider pending follow-up evaluations  F/U neurological evaluation. May consider pending follow-up evaluations  Lumbar MRI was ordered to evaluate lumbar lower extremity pain and paresthesias.  May consider radiofrequency rhizolysis or intraspinal procedures pending response to present treatment and F/U evaluation   Patient to call Pain Management Center should patient have concerns prior to scheduled return appointment.         Review of Systems     Objective:   Physical Exam        Assessment & Plan:

## 2016-03-05 NOTE — Patient Instructions (Addendum)
PLAN   Continue present medication  F/U PCP Dr. Sabino Snipes  for evaliation of  BP and general medical  Condition  Surgical evaluation . May consider pending follow-up evaluations as discussed  Rheumatological evaluation as discussed  Psych follow-up evaluation as scheduled  Ask the nurses the date of your lumbar MRI  Vascular follow-up evaluation as needed  F/U surgical evaluation. May consider pending follow-up evaluations  F/U neurological evaluation. May consider pending follow-up evaluations  May consider radiofrequency rhizolysis or intraspinal procedures pending response to present treatment and F/U evaluation   Patient to call Pain Management Center should patient have concerns prior to scheduled return appointment. Trigger Point Injection Trigger points are areas where you have muscle pain. A trigger point injection is a shot given in the trigger point to relieve that pain. A trigger point might feel like a knot in your muscle. It hurts to press on a trigger point. Sometimes the pain spreads out (radiates) to other parts of the body. For example, pressing on a trigger point in your shoulder might cause pain in your arm or neck. You might have one trigger point. Or, you might have more than one. People often have trigger points in their upper back and lower back. They also occur often in the neck and shoulders. Pain from a trigger point lasts for a long time. It can make it hard to keep moving. You might not be able to do the exercise or physical therapy that could help you deal with the pain. A trigger point injection may help. It does not work for everyone. But, it may relieve your pain for a few days or a few months. A trigger point injection does not cure long-lasting (chronic) pain. LET YOUR CAREGIVER KNOW ABOUT:  Any allergies (especially to latex, lidocaine, or steroids).  Blood-thinning medicines that you take. These drugs can lead to bleeding or bruising after an  injection. They include:  Aspirin.  Ibuprofen.  Clopidogrel.  Warfarin.  Other medicines you take. This includes all vitamins, herbs, eyedrops, over-the-counter medicines, and creams.  Use of steroids.  Recent infections.  Past problems with numbing medicines.  Bleeding problems.  Surgeries you have had.  Other health problems. RISKS AND COMPLICATIONS A trigger point injection is a safe treatment. However, problems may develop, such as:  Minor side effects usually go away in 1 to 2 days. These may include:  Soreness.  Bruising.  Stiffness.  More serious problems are rare. But, they may include:  Bleeding under the skin (hematoma).  Skin infection.  Breaking off of the needle under your skin.  Lung puncture.  The trigger point injection may not work for you. BEFORE THE PROCEDURE You may need to stop taking any medicine that thins your blood. This is to prevent bleeding and bruising. Usually these medicines are stopped several days before the injection. No other preparation is needed. PROCEDURE  A trigger point injection can be given in your caregiver's office or in a clinic. Each injection takes 2 minutes or less.  Your caregiver will feel for trigger points. The caregiver may use a marker to circle the area for the injection.  The skin over the trigger point will be washed with a germ-killing (antiseptic) solution.  The caregiver pinches the spot for the injection.  Then, a very thin needle is used for the shot. You may feel pain or a twitching feeling when the needle enters the trigger point.  A numbing solution may be injected into the trigger  point. Sometimes a drug to keep down swelling, redness, and warmth (inflammation) is also injected.  Your caregiver moves the needle around the trigger zone until the tightness and twitching goes away.  After the injection, your caregiver may put gentle pressure over the injection site.  Then it is covered with  a bandage. AFTER THE PROCEDURE  You can go right home after the injection.  The bandage can be taken off after a few hours.  You may feel sore and stiff for 1 to 2 days.  Go back to your regular activities slowly. Your caregiver may ask you to stretch your muscles. Do not do anything that takes extra energy for a few days.  Follow your caregiver's instructions to manage and treat other pain.   This information is not intended to replace advice given to you by your health care provider. Make sure you discuss any questions you have with your health care provider.   Document Released: 11/13/2011 Document Revised: 03/21/2013 Document Reviewed: 11/13/2011 Elsevier Interactive Patient Education 2016 [employer] Pain Management Discharge Instructions  General Discharge Instructions :  If you need to reach your doctor call: Monday-Friday 8:00 am - 4:00 pm at [phone] or toll free [phone].  After clinic hours [phone] to have operator reach doctor.  Bring all of your medication bottles to all your appointments in the pain clinic.  To cancel or reschedule your appointment with Pain Management please remember to call 24 hours in advance to avoid a fee.  Refer to the educational materials which you have been given on: General Risks, I had my Procedure. Discharge Instructions, Post Sedation.  Post Procedure Instructions:  The drugs you were given will stay in your system until tomorrow, so for the next 24 hours you should not drive, make any legal decisions or drink any alcoholic beverages.  You may eat anything you prefer, but it is better to start with liquids then soups and crackers, and gradually work up to solid foods.  Please notify your doctor immediately if you have any unusual bleeding, trouble breathing or pain that is not related to your normal pain.  Depending on the type of procedure that was done, some parts of your body may feel week and/or numb.  This  usually clears up by tonight or the next day.  Walk with the use of an assistive device or accompanied by an adult for the 24 hours.  You may use ice on the affected area for the first 24 hours.  Put ice in a Ziploc bag and cover with a towel and place against area 15 minutes on 15 minutes off.  You may switch to heat after 24 hours.

## 2016-03-05 NOTE — Progress Notes (Signed)
Safety precautions to be maintained throughout the outpatient stay will include: orient to surroundings, keep bed in low position, maintain call bell within reach at all times, provide assistance with transfer out of bed and ambulation.  

## 2016-03-06 ENCOUNTER — Telehealth: Payer: Self-pay | Admitting: *Deleted

## 2016-03-06 NOTE — Telephone Encounter (Signed)
No problems post post procedure. 

## 2016-03-18 ENCOUNTER — Other Ambulatory Visit: Payer: Self-pay | Admitting: Pain Medicine

## 2016-03-18 DIAGNOSIS — M533 Sacrococcygeal disorders, not elsewhere classified: Secondary | ICD-10-CM

## 2016-03-18 DIAGNOSIS — M5136 Other intervertebral disc degeneration, lumbar region: Secondary | ICD-10-CM | POA: Insufficient documentation

## 2016-03-18 DIAGNOSIS — M5416 Radiculopathy, lumbar region: Secondary | ICD-10-CM | POA: Insufficient documentation

## 2016-03-18 DIAGNOSIS — M47816 Spondylosis without myelopathy or radiculopathy, lumbar region: Secondary | ICD-10-CM | POA: Insufficient documentation

## 2016-03-18 DIAGNOSIS — G5790 Unspecified mononeuropathy of unspecified lower limb: Secondary | ICD-10-CM

## 2016-03-18 DIAGNOSIS — M5481 Occipital neuralgia: Secondary | ICD-10-CM

## 2016-03-18 DIAGNOSIS — M19012 Primary osteoarthritis, left shoulder: Secondary | ICD-10-CM

## 2016-03-18 DIAGNOSIS — M19011 Primary osteoarthritis, right shoulder: Secondary | ICD-10-CM

## 2016-03-18 DIAGNOSIS — M51369 Other intervertebral disc degeneration, lumbar region without mention of lumbar back pain or lower extremity pain: Secondary | ICD-10-CM | POA: Insufficient documentation

## 2016-03-18 DIAGNOSIS — M797 Fibromyalgia: Secondary | ICD-10-CM

## 2016-03-18 DIAGNOSIS — M7918 Myalgia, other site: Secondary | ICD-10-CM

## 2016-03-26 ENCOUNTER — Ambulatory Visit: Payer: Medicaid Other | Attending: Pain Medicine | Admitting: Pain Medicine

## 2016-03-26 ENCOUNTER — Encounter: Payer: Self-pay | Admitting: Pain Medicine

## 2016-03-26 VITALS — BP 123/74 | HR 78 | Temp 98.2°F | Resp 16 | Ht 66.0 in | Wt 148.0 lb

## 2016-03-26 DIAGNOSIS — M7060 Trochanteric bursitis, unspecified hip: Secondary | ICD-10-CM | POA: Insufficient documentation

## 2016-03-26 DIAGNOSIS — M5481 Occipital neuralgia: Secondary | ICD-10-CM | POA: Diagnosis not present

## 2016-03-26 DIAGNOSIS — M25511 Pain in right shoulder: Secondary | ICD-10-CM | POA: Diagnosis present

## 2016-03-26 DIAGNOSIS — M19012 Primary osteoarthritis, left shoulder: Secondary | ICD-10-CM

## 2016-03-26 DIAGNOSIS — M7590 Shoulder lesion, unspecified, unspecified shoulder: Secondary | ICD-10-CM | POA: Insufficient documentation

## 2016-03-26 DIAGNOSIS — M1610 Unilateral primary osteoarthritis, unspecified hip: Secondary | ICD-10-CM | POA: Insufficient documentation

## 2016-03-26 DIAGNOSIS — M797 Fibromyalgia: Secondary | ICD-10-CM

## 2016-03-26 DIAGNOSIS — Z85850 Personal history of malignant neoplasm of thyroid: Secondary | ICD-10-CM | POA: Insufficient documentation

## 2016-03-26 DIAGNOSIS — M19011 Primary osteoarthritis, right shoulder: Secondary | ICD-10-CM

## 2016-03-26 DIAGNOSIS — M533 Sacrococcygeal disorders, not elsewhere classified: Secondary | ICD-10-CM | POA: Insufficient documentation

## 2016-03-26 DIAGNOSIS — R5382 Chronic fatigue, unspecified: Secondary | ICD-10-CM | POA: Diagnosis not present

## 2016-03-26 DIAGNOSIS — M792 Neuralgia and neuritis, unspecified: Secondary | ICD-10-CM | POA: Insufficient documentation

## 2016-03-26 DIAGNOSIS — N809 Endometriosis, unspecified: Secondary | ICD-10-CM | POA: Insufficient documentation

## 2016-03-26 DIAGNOSIS — M19019 Primary osteoarthritis, unspecified shoulder: Secondary | ICD-10-CM | POA: Diagnosis not present

## 2016-03-26 DIAGNOSIS — M7918 Myalgia, other site: Secondary | ICD-10-CM

## 2016-03-26 DIAGNOSIS — G5790 Unspecified mononeuropathy of unspecified lower limb: Secondary | ICD-10-CM

## 2016-03-26 DIAGNOSIS — M542 Cervicalgia: Secondary | ICD-10-CM | POA: Diagnosis not present

## 2016-03-26 DIAGNOSIS — M25512 Pain in left shoulder: Secondary | ICD-10-CM | POA: Diagnosis present

## 2016-03-26 DIAGNOSIS — M546 Pain in thoracic spine: Secondary | ICD-10-CM | POA: Diagnosis present

## 2016-03-26 MED ORDER — CYCLOBENZAPRINE HCL 5 MG PO TABS: ORAL_TABLET | ORAL | Status: DC

## 2016-03-26 MED ORDER — TRAMADOL HCL 50 MG PO TABS: ORAL_TABLET | ORAL | Status: DC

## 2016-03-26 NOTE — Progress Notes (Signed)
Subjective:    Patient ID: Kristina Li, female    DOB: [birth year], 35 y.o.   MRN: [id number]  HPI  The patient is a 35 year old female who returns to pain management for further evaluation and treatment of pain involving the shoulders and upper back lower back lower extremity region. We discussed patient's condition on today's visit and patient will undergo lumbar MRI. We will await results of lumbar MRI to consider patient for additional interventional treatment. The patient has had some improvement of pain following interventional treatment for pain involving the region of the shoulder. The patient states that the lower back lower extremity pain is aggravated by standing walking and becomes more intense as the day progresses. Decision was made to prescribe tramadol and Flexeril for patient on today's visit. We will observe response to medication adjustments and will review lumbar MRI results at time return appointment and consider additional modifications of patient treatment regimen. The patient was with understanding and in agreement with suggested treatment plan. The patient denied any trauma change in events of daily living the call significant change in symptomatology. The suggested treatment plan.  Review of Systems     Objective:   Physical Exam  There was tenderness of the splenius capitis and occipitalis musculature region a mild degree with moderate tends to palpation over the trapezius musculature region and levator scapula and rhomboid musculature region. There was tenderness of the acromial clavicular and glenohumeral joint region a mild to moderate degree and patient appeared to be with unremarkable Spurling's maneuver. The patient appeared to be with slightly decreased grip strength with Tinel and Phalen's maneuver reproducing minimal discomfort. Palpation over the thoracic facet thoracic paraspinal muscular region was attends to palpation of moderate degree. There was  moderate muscle spasms noted in the thoracic musculature region. No crepitus of the thoracic region was noted. Palpation over the region of the lumbar paraspinal muscles lumbar facet region was attends to palpation of moderate degree with lateral bending rotation extension and palpation reproducing moderate discomfort with moderate tenderness over the PSIS PII S region left greater than the right. Palpation of the greater trochanteric region iliotibial band region reproduced mild to moderate discomfort. Straight leg raise was tolerates approximately 30 without an increase of pain with dorsiflexion noted. There was negative clonus negative Homans. No sensory deficit or dermatomal distribution facet. Abdomen nontender with no costovertebral tenderness noted.      Assessment & Plan:       Degenerative joint disease of shoulder  Tendinitis of shoulder  Bilateral occipital neuralgia  Cervicalgia  Sacroiliac joint dysfunction  Ilioinguinal neuralgia  Fibromyalgia  Chronic fatigue syndrome  Myofascial pain syndrome  History of surgery for thyroid cancer  Endometriosis  Degenerative joint disease of hip  Greater trochanteric bursitis     PLAN   Continue present medications continue Flexeril and begin tramadol. Caution tramadol and Flexeril ( cyclobenzaprine )can cause respiratory depression, excessive sedation, confusion, and other side effects   F/U PCP Dr. Sabino Snipes  for evaliation of  BP and general medical  condition  Rheumatological evaluation as discussed  Psych follow-up evaluation as scheduled  Vascular follow-up evaluation as needed  Oncology follow-up evaluation  Ask the nurses and secretary the date of your MRI of the lumbar region  F/U surgical evaluation. May consider pending follow-up evaluations  F/U neurological evaluation. May consider PNCV/EMG studies and other studies pending follow-up evaluations  May consider radiofrequency rhizolysis or  intraspinal procedures pending response to present treatment and F/U  evaluation   Patient to call Pain Management Center should patient have concerns prior to scheduled return appointment.

## 2016-03-26 NOTE — Progress Notes (Signed)
Patient her for f/up evaluation following trigger point injection of Left shoulder.  Patient is not currently taking pain medication Safety precautions to be maintained throughout the outpatient stay will include: orient to surroundings, keep bed in low position, maintain call bell within reach at all times, provide assistance with transfer out of bed and ambulation.

## 2016-03-26 NOTE — Patient Instructions (Addendum)
PLAN   Continue present medications continue Flexeril and begin tramadol. Caution tramadol and Flexeril ( cyclobenzaprine )can cause respiratory depression, excessive sedation, confusion, and other side effects   F/U PCP Dr. Sabino Snipes  for evaliation of  BP and general medical  condition  Rheumatological evaluation as discussed  Psych follow-up evaluation as scheduled  Vascular follow-up evaluation as needed  Ask the nurses and secretary the date of your MRI of the lumbar region  F/U surgical evaluation. May consider pending follow-up evaluations  F/U neurological evaluation. May consider pending follow-up evaluations  May consider radiofrequency rhizolysis or intraspinal procedures pending response to present treatment and F/U evaluation   Patient to call [facility] should patient have concerns prior to scheduled return appointment.

## 2016-04-09 ENCOUNTER — Ambulatory Visit
Admission: RE | Admit: 2016-04-09 | Discharge: 2016-04-09 | Disposition: A | Discharge status: Home / Self Care | Payer: Medicaid Other | Source: Ambulatory Visit | Attending: Pain Medicine | Admitting: Pain Medicine

## 2016-04-09 DIAGNOSIS — M5136 Other intervertebral disc degeneration, lumbar region: Secondary | ICD-10-CM | POA: Diagnosis present

## 2016-04-09 DIAGNOSIS — M545 Low back pain: Secondary | ICD-10-CM | POA: Diagnosis present

## 2016-04-09 DIAGNOSIS — M19012 Primary osteoarthritis, left shoulder: Secondary | ICD-10-CM | POA: Insufficient documentation

## 2016-04-09 DIAGNOSIS — M7918 Myalgia, other site: Secondary | ICD-10-CM

## 2016-04-09 DIAGNOSIS — M47816 Spondylosis without myelopathy or radiculopathy, lumbar region: Secondary | ICD-10-CM

## 2016-04-09 DIAGNOSIS — M19011 Primary osteoarthritis, right shoulder: Secondary | ICD-10-CM | POA: Diagnosis not present

## 2016-04-09 DIAGNOSIS — G5790 Unspecified mononeuropathy of unspecified lower limb: Secondary | ICD-10-CM | POA: Insufficient documentation

## 2016-04-09 DIAGNOSIS — M5481 Occipital neuralgia: Secondary | ICD-10-CM | POA: Diagnosis not present

## 2016-04-09 DIAGNOSIS — M533 Sacrococcygeal disorders, not elsewhere classified: Secondary | ICD-10-CM | POA: Insufficient documentation

## 2016-04-09 DIAGNOSIS — M47896 Other spondylosis, lumbar region: Secondary | ICD-10-CM | POA: Insufficient documentation

## 2016-04-09 DIAGNOSIS — M5416 Radiculopathy, lumbar region: Secondary | ICD-10-CM

## 2016-04-09 DIAGNOSIS — M797 Fibromyalgia: Secondary | ICD-10-CM | POA: Insufficient documentation

## 2016-04-09 NOTE — Progress Notes (Signed)
Attempted to call patient, message left. 

## 2016-04-10 ENCOUNTER — Telehealth: Payer: Self-pay | Admitting: *Deleted

## 2016-04-10 NOTE — Telephone Encounter (Signed)
Spoke with patient re; MRI of the lumbar spine results.  Patient verbalizes u/o information given and will further discuss with Dr Primus Bravo at return appt.

## 2016-04-10 NOTE — Telephone Encounter (Signed)
Returned pt called and she stated that someone had called with her test results. I informed her that I would reach out to the nurses station and have someone to call her back.Marland KitchenMarland KitchenTD

## 2016-04-10 NOTE — Telephone Encounter (Signed)
Nurses and Secretary I gave you a description of the findings of patient's MRI. Please call patient and give her the findings which I mentioned in the note to you earlier this week. As mentioned in the note I will discuss the details with patient more specifically at time of her appointment

## 2016-04-11 NOTE — Telephone Encounter (Signed)
Pt called in wanting to know why her appt for 04/14/16 was cancelled and also wanted to know her results from her MRI? She also request an appt ASAP. Gave an appt for 04/16/16 @ 9 am..Marland KitchenTD

## 2016-04-14 ENCOUNTER — Ambulatory Visit: Payer: Medicaid Other | Admitting: Pain Medicine

## 2016-04-16 ENCOUNTER — Encounter: Payer: Self-pay | Admitting: Pain Medicine

## 2016-04-16 ENCOUNTER — Ambulatory Visit: Payer: Medicaid Other | Attending: Pain Medicine | Admitting: Pain Medicine

## 2016-04-16 ENCOUNTER — Telehealth: Payer: Self-pay | Admitting: Pain Medicine

## 2016-04-16 ENCOUNTER — Telehealth: Payer: Self-pay | Admitting: *Deleted

## 2016-04-16 VITALS — BP 121/72 | HR 80 | Temp 97.9°F | Resp 16 | Ht 66.5 in | Wt 150.0 lb

## 2016-04-16 DIAGNOSIS — R5382 Chronic fatigue, unspecified: Secondary | ICD-10-CM | POA: Insufficient documentation

## 2016-04-16 DIAGNOSIS — M5481 Occipital neuralgia: Secondary | ICD-10-CM

## 2016-04-16 DIAGNOSIS — M7060 Trochanteric bursitis, unspecified hip: Secondary | ICD-10-CM | POA: Diagnosis not present

## 2016-04-16 DIAGNOSIS — M797 Fibromyalgia: Secondary | ICD-10-CM | POA: Diagnosis not present

## 2016-04-16 DIAGNOSIS — M1610 Unilateral primary osteoarthritis, unspecified hip: Secondary | ICD-10-CM | POA: Insufficient documentation

## 2016-04-16 DIAGNOSIS — M7590 Shoulder lesion, unspecified, unspecified shoulder: Secondary | ICD-10-CM | POA: Insufficient documentation

## 2016-04-16 DIAGNOSIS — M5136 Other intervertebral disc degeneration, lumbar region: Secondary | ICD-10-CM | POA: Diagnosis not present

## 2016-04-16 DIAGNOSIS — M4186 Other forms of scoliosis, lumbar region: Secondary | ICD-10-CM | POA: Diagnosis not present

## 2016-04-16 DIAGNOSIS — M19019 Primary osteoarthritis, unspecified shoulder: Secondary | ICD-10-CM | POA: Insufficient documentation

## 2016-04-16 DIAGNOSIS — M19011 Primary osteoarthritis, right shoulder: Secondary | ICD-10-CM

## 2016-04-16 DIAGNOSIS — M533 Sacrococcygeal disorders, not elsewhere classified: Secondary | ICD-10-CM

## 2016-04-16 DIAGNOSIS — M7918 Myalgia, other site: Secondary | ICD-10-CM

## 2016-04-16 DIAGNOSIS — N809 Endometriosis, unspecified: Secondary | ICD-10-CM | POA: Diagnosis not present

## 2016-04-16 DIAGNOSIS — M25511 Pain in right shoulder: Secondary | ICD-10-CM | POA: Diagnosis present

## 2016-04-16 DIAGNOSIS — Z85850 Personal history of malignant neoplasm of thyroid: Secondary | ICD-10-CM | POA: Insufficient documentation

## 2016-04-16 DIAGNOSIS — M792 Neuralgia and neuritis, unspecified: Secondary | ICD-10-CM | POA: Insufficient documentation

## 2016-04-16 DIAGNOSIS — G5790 Unspecified mononeuropathy of unspecified lower limb: Secondary | ICD-10-CM

## 2016-04-16 DIAGNOSIS — M25512 Pain in left shoulder: Secondary | ICD-10-CM | POA: Diagnosis present

## 2016-04-16 DIAGNOSIS — M19012 Primary osteoarthritis, left shoulder: Secondary | ICD-10-CM

## 2016-04-16 DIAGNOSIS — M542 Cervicalgia: Secondary | ICD-10-CM | POA: Diagnosis not present

## 2016-04-16 MED ORDER — CYCLOBENZAPRINE HCL 10 MG PO TABS: ORAL_TABLET | ORAL | Status: DC

## 2016-04-16 NOTE — Telephone Encounter (Signed)
Called patient back after speaking with pharmacy, [facility] stated that Dr Primus Bravo is not a [employer] and that is why they didn't fill the Rx.  Spoke with Dr Primus Bravo, call made to [facility] and she would call [facility] to see if she could clarify coverage re; Dr Primus Bravo.  Patient called to let her know that we are trying to get this matter straight for her.

## 2016-04-16 NOTE — Progress Notes (Signed)
Subjective:    Patient ID: Kristina Li, female    DOB: 12-17-1980, 35 y.o.   MRN: [id number]  HPI  The patient is a 35 year old female who returns to pain management for further evaluation and treatment of pain involving the shoulders entire back upper and lower extremity regions. The patient states that she has had some improvement of pain involving the region of the shoulder status post treatment in pain management Center. We discussed patient's medications including Flexeril and tramadol. The patient stated that she has not had her tramadol prescription filled at this point. We discussed patient's condition and have advised patient to have the tramadol prescription feel and we will observe response to the tramadol. The patient was in agreement with recommendations and will have tramadol feel. Patient also has had benefit with Flexeril and Flexeril was prescribed for patient on today's visit as well. We will consider interventional treatment at time return appointment consisting of lumbar facet, medial branch nerve blocks. The patient stated the pain increased with standing walking twisting turning and review of patient's MRI on today's visit reveal patient to be with evidence of level scoliosis and facet degenerative changes. We will proceed with lumbar facet, medial branch nerve, blocks at time return appointment in attempt to decrease severely disabling pain, minimize progression of symptoms, and avoid the need for more involved treatment. All agreed to suggested treatment plan  Review of Systems     Objective:   Physical Exam  There was tenderness to palpation over the paraspinal musculature region of the cervical region cervical facet region a mild to moderate degree with mild to moderate tenderness over the splenius capitis and occipitalis musculature region. Palpation of the acromioclavicular and glenohumeral joint regions reproduces moderate discomfort. The patient appeared to be  with unremarkable Spurling's maneuver and was with unremarkable drop test. Tinel and Phalen's maneuver were without increased pain of significant degree. There was tenderness over the thoracic facet thoracic paraspinal musculatures and with no crepitus of the thoracic region noted. Palpation over the lumbar paraspinal muscular region lumbar facet region associated with moderate to moderately severe discomfort. Lateral bending rotation extension and palpation of the lumbar facets reproduced moderately severe discomfort. Palpation over the region of the PSIS and PII S region reproduces moderate discomfort. There was mild tenderness over the greater trochanteric region iliotibial band region. Straight leg raise was tolerates approximately 30 without increased pain with dorsiflexion noted. EHL strength appeared to be decreased. The predominant portion of patient's pain was increased with palpation over the lumbar facet region and especially with lateral bending and rotation There was negative clonus negative Homans. Abdomen was nontender with no costovertebral tenderness noted      Assessment & Plan:     Degenerative disc disease of the lumbar spine  Mild lumbar levoscoliosis is again seen with apex at L3. There is no listhesis. Vertebral body heights are preserved. Intervertebral discs are well hydrated and maintained in height. No significant vertebral marrow edema is seen. The conus medullaris is normal in signal and terminates at L1-2. Paraspinal soft tissues are unremarkable.  There is mild lower lumbar facet arthrosis bilaterally from L3-4 to L5-S1, greatest at L4-5. No disc herniation, spinal stenosis, or neural foraminal stenosis is identified.  IMPRESSION: 1. Mild lower lumbar facet arthrosis. No disc herniation or stenosis. 2. Mild lumbar levoscoliosis.  Lumbar facet syndrome  Sacroiliac joint dysfunction  Degenerative joint disease of shoulder  Tendinitis of  shoulder  Bilateral occipital neuralgia  Cervicalgia  Ilioinguinal  neuralgia  Fibromyalgia  Chronic fatigue syndrome  Myofascial pain syndrome  History of surgery for thyroid cancer  Endometriosis  Degenerative joint disease of hip  Greater trochanteric bursitis      PLAN   Continue present medication Flexeril and tramadol. Caution tramadol and Flexeril ( cyclobenzaprine )can cause respiratory depression, excessive sedation, confusion, and other side effects   Lumbar facet, medial branch nerve, blocks to be performed at time of return appointment  F/U PCP Dr. Sabino Snipes  for evaliation of  BP and general medical  condition  Rheumatological evaluation as discussed  Psych follow-up evaluation as scheduled  Vascular follow-up evaluation as needed  F/U surgical evaluation. May consider pending follow-up evaluations  F/U neurological evaluation. May consider pending follow-up evaluations  May consider radiofrequency rhizolysis or intraspinal procedures pending response to present treatment and F/U evaluation   Patient to call [facility] Management Center should patient have concerns prior to scheduled return appointment.

## 2016-04-16 NOTE — Patient Instructions (Addendum)
PLAN   Continue present medications continue Flexeril and begin tramadol. Caution tramadol and Flexeril ( cyclobenzaprine )can cause respiratory depression, excessive sedation, confusion, and other side effects   Lumbar facet, medial branch nerve, blocks to be performed at time of return appointment  F/U PCP Dr. Sabino Snipes  for evaliation of  BP and general medical  condition  Rheumatological evaluation as discussed  Psych follow-up evaluation as scheduled  Vascular follow-up evaluation as needed  F/U surgical evaluation. May consider pending follow-up evaluations  F/U neurological evaluation. May consider pending follow-up evaluations  May consider radiofrequency rhizolysis or intraspinal procedures pending response to present treatment and F/U evaluation   Patient to call Pain Management Center should patient have concerns prior to scheduled return appointment. Facet Blocks Patient Information  Description: The facets are joints in the spine between the vertebrae.  Like any joints in the body, facets can become irritated and painful.  Arthritis can also effect the facets.  By injecting steroids and local anesthetic in and around these joints, we can temporarily block the nerve supply to them.  Steroids act directly on irritated nerves and tissues to reduce selling and inflammation which often leads to decreased pain.  Facet blocks may be done anywhere along the spine from the neck to the low back depending upon the location of your pain.   After numbing the skin with local anesthetic (like Novocaine), a small needle is passed onto the facet joints under x-ray guidance.  You may experience a sensation of pressure while this is being done.  The entire block usually lasts about 15-25 minutes.   Conditions which may be treated by facet blocks:   Low back/buttock pain  Neck/shoulder pain  Certain types of headaches  Preparation for the injection:  1. Do not eat any solid food or  dairy products within 8 hours of your appointment. 2. You may drink clear liquid up to 3 hours before appointment.  Clear liquids include water, black coffee, juice or soda.  No milk or cream please. 3. You may take your regular medication, including pain medications, with a sip of water before your appointment.  Diabetics should hold regular insulin (if taken separately) and take 1/2 normal NPH dose the morning of the procedure.  Carry some sugar containing items with you to your appointment. 4. A driver must accompany you and be prepared to drive you home after your procedure. 5. Bring all your current medications with you. 6. An IV may be inserted and sedation may be given at the discretion of the physician. 7. A blood pressure cuff, EKG and other monitors will often be applied during the procedure.  Some patients may need to have extra oxygen administered for a short period. 8. You will be asked to provide medical information, including your allergies and medications, prior to the procedure.  We must know immediately if you are taking blood thinners (like Coumadin/Warfarin) or if you are allergic to IV iodine contrast (dye).  We must know if you could possible be pregnant.  Possible side-effects:   Bleeding from needle site  Infection (rare, may require surgery)  Nerve injury (rare)  Numbness & tingling (temporary)  Difficulty urinating (rare, temporary)  Spinal headache (a headache worse with upright posture)  Light-headedness (temporary)  Pain at injection site (serveral days)  Decreased blood pressure (rare, temporary)  Weakness in arm/leg (temporary)  Pressure sensation in back/neck (temporary)   Call if you experience:   Fever/chills associated with headache or increased back/neck  pain  Headache worsened by an upright position  New onset, weakness or numbness of an extremity below the injection site  Hives or difficulty breathing (go to the emergency  room)  Inflammation or drainage at the injection site(s)  Severe back/neck pain greater than usual  New symptoms which are concerning to you  Please note:  Although the local anesthetic injected can often make your back or neck feel good for several hours after the injection, the pain will likely return. It takes 3-7 days for steroids to work.  You may not notice any pain relief for at least one week.  If effective, we will often do a series of 2-3 injections spaced 3-6 weeks apart to maximally decrease your pain.  After the initial series, you may be a candidate for a more permanent nerve block of the facets.  If you have any questions, please call #[phone] [place]  What are the risk, side effects and possible complications? Generally speaking, most procedures are safe.  However, with any procedure there are risks, side effects, and the possibility of complications.  The risks and complications are dependent upon the sites that are lesioned, or the type of nerve block to be performed.  The closer the procedure is to the spine, the more serious the risks are.  Great care is taken when placing the radio frequency needles, block needles or lesioning probes, but sometimes complications can occur. 1. Infection: Any time there is an injection through the skin, there is a risk of infection.  This is why sterile conditions are used for these blocks.  There are four possible types of infection. 1. Localized skin infection. 2. Central Nervous System Infection-This can be in the form of Meningitis, which can be deadly. 3. Epidural Infections-This can be in the form of an epidural abscess, which can cause pressure inside of the spine, causing compression of the spinal cord with subsequent paralysis. This would require an emergency surgery to decompress, and there are no guarantees that the patient would recover from the  paralysis. 4. Discitis-This is an infection of the intervertebral discs.  It occurs in about 1% of discography procedures.  It is difficult to treat and it may lead to surgery.        2. Pain: the needles have to go through skin and soft tissues, will cause soreness.       3. Damage to internal structures:  The nerves to be lesioned may be near blood vessels or    other nerves which can be potentially damaged.       4. Bleeding: Bleeding is more common if the patient is taking blood thinners such as  aspirin, Coumadin, Ticiid, Plavix, etc., or if he/she have some genetic predisposition  such as hemophilia. Bleeding into the spinal canal can cause compression of the spinal  cord with subsequent paralysis.  This would require an emergency surgery to  decompress and there are no guarantees that the patient would recover from the  paralysis.       5. Pneumothorax:  Puncturing of a lung is a possibility, every time a needle is introduced in  the area of the chest or upper back.  Pneumothorax refers to free air around the  collapsed lung(s), inside of the thoracic cavity (chest cavity).  Another two possible  complications related to a similar event would include: Hemothorax and Chylothorax.   These are variations of the Pneumothorax, where instead of  air around the collapsed  lung(s), you may have blood or chyle, respectively.       6. Spinal headaches: They may occur with any procedures in the area of the spine.       7. Persistent CSF (Cerebro-Spinal Fluid) leakage: This is a rare problem, but may occur  with prolonged intrathecal or epidural catheters either due to the formation of a fistulous  track or a dural tear.       8. Nerve damage: By working so close to the spinal cord, there is always a possibility of  nerve damage, which could be as serious as a permanent spinal cord injury with  paralysis.       9. Death:  Although rare, severe deadly allergic reactions known as "Anaphylactic  reaction" can  occur to any of the medications used.      10. Worsening of the symptoms:  We can always make thing worse.  What are the chances of something like this happening? Chances of any of this occuring are extremely low.  By statistics, you have more of a chance of getting killed in a motor vehicle accident: while driving to the hospital than any of the above occurring .  Nevertheless, you should be aware that they are possibilities.  In general, it is similar to taking a shower.  Everybody knows that you can slip, hit your head and get killed.  Does that mean that you should not shower again?  Nevertheless always keep in mind that statistics do not mean anything if you happen to be on the wrong side of them.  Even if a procedure has a 1 (one) in a 1,000,000 (million) chance of going wrong, it you happen to be that one..Also, keep in mind that by statistics, you have more of a chance of having something go wrong when taking medications.  Who should not have this procedure? If you are on a blood thinning medication (e.g. Coumadin, Plavix, see list of "Blood Thinners"), or if you have an active infection going on, you should not have the procedure.  If you are taking any blood thinners, please inform your physician.  How should I prepare for this procedure?  Do not eat or drink anything at least six hours prior to the procedure.  Bring a driver with you .  It cannot be a taxi.  Come accompanied by an adult that can drive you back, and that is strong enough to help you if your legs get weak or numb from the local anesthetic.  Take all of your medicines the morning of the procedure with just enough water to swallow them.  If you have diabetes, make sure that you are scheduled to have your procedure done first thing in the morning, whenever possible.  If you have diabetes, take only half of your insulin dose and notify our nurse that you have done so as soon as you arrive at the clinic.  If you are  diabetic, but only take blood sugar pills (oral hypoglycemic), then do not take them on the morning of your procedure.  You may take them after you have had the procedure.  Do not take aspirin or any aspirin-containing medications, at least eleven (11) days prior to the procedure.  They may prolong bleeding.  Wear loose fitting clothing that may be easy to take off and that you would not mind if it got stained with Betadine or blood.  Do not wear any jewelry or perfume  Remove  any nail coloring.  It will interfere with some of our monitoring equipment.  NOTE: Remember that this is not meant to be interpreted as a complete list of all possible complications.  Unforeseen problems may occur.  BLOOD THINNERS The following drugs contain aspirin or other products, which can cause increased bleeding during surgery and should not be taken for 2 weeks prior to and 1 week after surgery.  If you should need take something for relief of minor pain, you may take acetaminophen which is found in Tylenol,m Datril, Anacin-3 and Panadol. It is not blood thinner. The products listed below are.  Do not take any of the products listed below in addition to any listed on your instruction sheet.  A.P.C or A.P.C with Codeine Codeine Phosphate Capsules #3 Ibuprofen Ridaura  ABC compound Congesprin Imuran rimadil  Advil Cope Indocin Robaxisal  Alka-Seltzer Effervescent Pain Reliever and Antacid Coricidin or Coricidin-D  Indomethacin Rufen  Alka-Seltzer plus Cold Medicine Cosprin Ketoprofen S-A-C Tablets  Anacin Analgesic Tablets or Capsules Coumadin Korlgesic Salflex  Anacin Extra Strength Analgesic tablets or capsules CP-2 Tablets Lanoril Salicylate  Anaprox Cuprimine Capsules Levenox Salocol  Anexsia-D Dalteparin Magan Salsalate  Anodynos Darvon compound Magnesium Salicylate Sine-off  Ansaid Dasin Capsules Magsal Sodium Salicylate  Anturane Depen Capsules Marnal Soma  APF Arthritis pain formula Dewitt's Pills  Measurin Stanback  Argesic Dia-Gesic Meclofenamic Sulfinpyrazone  Arthritis Bayer Timed Release Aspirin Diclofenac Meclomen Sulindac  Arthritis pain formula Anacin Dicumarol Medipren Supac  Analgesic (Safety coated) Arthralgen Diffunasal Mefanamic Suprofen  Arthritis Strength Bufferin Dihydrocodeine Mepro Compound Suprol  Arthropan liquid Dopirydamole Methcarbomol with Aspirin Synalgos  ASA tablets/Enseals Disalcid Micrainin Tagament  Ascriptin Doan's Midol Talwin  Ascriptin A/D Dolene Mobidin Tanderil  Ascriptin Extra Strength Dolobid Moblgesic Ticlid  Ascriptin with Codeine Doloprin or Doloprin with Codeine Momentum Tolectin  Asperbuf Duoprin Mono-gesic Trendar  Aspergum Duradyne Motrin or Motrin IB Triminicin  Aspirin plain, buffered or enteric coated Durasal Myochrisine Trigesic  Aspirin Suppositories Easprin Nalfon Trillsate  Aspirin with Codeine Ecotrin Regular or Extra Strength Naprosyn Uracel  Atromid-S Efficin Naproxen Ursinus  Auranofin Capsules Elmiron Neocylate Vanquish  Axotal Emagrin Norgesic Verin  Azathioprine Empirin or Empirin with Codeine Normiflo Vitamin E  Azolid Emprazil Nuprin Voltaren  Bayer Aspirin plain, buffered or children's or timed BC Tablets or powders Encaprin Orgaran Warfarin Sodium  Buff-a-Comp Enoxaparin Orudis Zorpin  Buff-a-Comp with Codeine Equegesic Os-Cal-Gesic   Buffaprin Excedrin plain, buffered or Extra Strength Oxalid   Bufferin Arthritis Strength Feldene Oxphenbutazone   Bufferin plain or Extra Strength Feldene Capsules Oxycodone with Aspirin   Bufferin with Codeine Fenoprofen Fenoprofen Pabalate or Pabalate-SF   Buffets II Flogesic Panagesic   Buffinol plain or Extra Strength Florinal or Florinal with Codeine Panwarfarin   Buf-Tabs Flurbiprofen Penicillamine   Butalbital Compound Four-way cold tablets Penicillin   Butazolidin Fragmin Pepto-Bismol   Carbenicillin Geminisyn Percodan   Carna Arthritis Reliever Geopen Persantine    Carprofen Gold's salt Persistin   Chloramphenicol Goody's Phenylbutazone   Chloromycetin Haltrain Piroxlcam   Clmetidine heparin Plaquenil   Cllnoril Hyco-pap Ponstel   Clofibrate Hydroxy chloroquine Propoxyphen         Before stopping any of these medications, be sure to consult the physician who ordered them.  Some, such as Coumadin (Warfarin) are ordered to prevent or treat serious conditions such as "deep thrombosis", "pumonary embolisms", and other heart problems.  The amount of time that you may need off of the medication may also vary with the medication and the  reason for which you were taking it.  If you are taking any of these medications, please make sure you notify your pain physician before you undergo any procedures.

## 2016-04-16 NOTE — Telephone Encounter (Signed)
Went to [facility] to fill scripts they told her it was not authorized to fill, [facility] on [address], could nurse please call them and find out why they cant fill script and let patient know?

## 2016-04-16 NOTE — Progress Notes (Signed)
Safety precautions to be maintained throughout the outpatient stay will include: orient to surroundings, keep bed in low position, maintain call bell within reach at all times, provide assistance with transfer out of bed and ambulation.  

## 2016-04-25 LAB — TOXASSURE SELECT 13 (MW), URINE

## 2016-04-28 ENCOUNTER — Ambulatory Visit: Payer: Medicaid Other | Attending: Pain Medicine | Admitting: Pain Medicine

## 2016-04-28 ENCOUNTER — Encounter: Payer: Self-pay | Admitting: Pain Medicine

## 2016-04-28 VITALS — BP 98/55 | HR 79 | Temp 97.0°F | Resp 18 | Ht 66.5 in | Wt 150.0 lb

## 2016-04-28 DIAGNOSIS — M19012 Primary osteoarthritis, left shoulder: Secondary | ICD-10-CM

## 2016-04-28 DIAGNOSIS — G5790 Unspecified mononeuropathy of unspecified lower limb: Secondary | ICD-10-CM

## 2016-04-28 DIAGNOSIS — M7918 Myalgia, other site: Secondary | ICD-10-CM

## 2016-04-28 DIAGNOSIS — M5481 Occipital neuralgia: Secondary | ICD-10-CM

## 2016-04-28 DIAGNOSIS — M19011 Primary osteoarthritis, right shoulder: Secondary | ICD-10-CM

## 2016-04-28 DIAGNOSIS — M5136 Other intervertebral disc degeneration, lumbar region: Secondary | ICD-10-CM | POA: Diagnosis not present

## 2016-04-28 DIAGNOSIS — M797 Fibromyalgia: Secondary | ICD-10-CM

## 2016-04-28 DIAGNOSIS — M545 Low back pain: Secondary | ICD-10-CM | POA: Insufficient documentation

## 2016-04-28 DIAGNOSIS — M533 Sacrococcygeal disorders, not elsewhere classified: Secondary | ICD-10-CM

## 2016-04-28 DIAGNOSIS — M4186 Other forms of scoliosis, lumbar region: Secondary | ICD-10-CM | POA: Insufficient documentation

## 2016-04-28 DIAGNOSIS — M542 Cervicalgia: Secondary | ICD-10-CM

## 2016-04-28 MED ORDER — CYCLOBENZAPRINE HCL 10 MG PO TABS: ORAL_TABLET | ORAL | Status: DC

## 2016-04-28 MED ORDER — BUPIVACAINE HCL (PF) 0.25 % IJ SOLN
30.0000 mL | Freq: Once | INTRAMUSCULAR | Status: AC
  Administered 2016-04-28: 30 mL
  Filled 2016-04-28: qty 30

## 2016-04-28 MED ORDER — LACTATED RINGERS IV SOLN: 1000.0000 mL | INTRAVENOUS | Status: DC

## 2016-04-28 MED ORDER — MIDAZOLAM HCL 5 MG/5ML IJ SOLN
5.0000 mg | Freq: Once | INTRAMUSCULAR | Status: AC
  Administered 2016-04-28: 5 mg via INTRAVENOUS
  Filled 2016-04-28: qty 5

## 2016-04-28 MED ORDER — TRIAMCINOLONE ACETONIDE 40 MG/ML IJ SUSP
40.0000 mg | Freq: Once | INTRAMUSCULAR | Status: AC
  Administered 2016-04-28: 40 mg
  Filled 2016-04-28: qty 1

## 2016-04-28 MED ORDER — FENTANYL CITRATE (PF) 100 MCG/2ML IJ SOLN
100.0000 ug | Freq: Once | INTRAMUSCULAR | Status: AC
  Administered 2016-04-28: 100 ug via INTRAVENOUS
  Filled 2016-04-28: qty 2

## 2016-04-28 MED ORDER — ORPHENADRINE CITRATE 30 MG/ML IJ SOLN
60.0000 mg | Freq: Once | INTRAMUSCULAR | Status: AC
  Administered 2016-04-28: 60 mg via INTRAMUSCULAR
  Filled 2016-04-28: qty 2

## 2016-04-28 NOTE — Progress Notes (Signed)
Subjective:    Patient ID: Kristina Li, female    DOB: 01-Nov-1981, 35 y.o.   MRN: [id number]  HPI  PROCEDURE PERFORMED: Lumbar facet (medial branch block)   NOTE: The patient is a 35 y.o. female who returns to [place] for further evaluation and treatment of pain involving the lumbar and lower extremity region. MRI  revealed the patient to be with evidence of   Degenerative disc disease of the lumbar spine  Mild lumbar levoscoliosis is again seen with apex at L3. There is no listhesis. Vertebral body heights are preserved. Intervertebral discs are well hydrated and maintained in height. No significant vertebral marrow edema is seen. The conus medullaris is normal in signal and terminates at L1-2. Paraspinal soft tissues are unremarkable.  There is mild lower lumbar facet arthrosis bilaterally from L3-4 to L5-S1, greatest at L4-5. No disc herniation, spinal stenosis, or neural foraminal stenosis is identified.  IMPRESSION: 1. Mild lower lumbar facet arthrosis. No disc herniation or stenosis. 2. Mild lumbar levoscoliosis.. . There is concern regarding significant component of patient's pain being due to lumbar facet syndrome The risks, benefits, and expectations of the procedure have been discussed and explained to the patient who was understanding and in agreement with suggested treatment plan. We will proceed with interventional treatment as discussed and as explained to the patient who was understanding and wished to proceed with procedure as planned.   DESCRIPTION OF PROCEDURE: Lumbar facet (medial branch block) with IV Versed, IV fentanyl conscious sedation, EKG, blood pressure, pulse, capnography, and pulse oximetry monitoring. The procedure was performed with the patient in the prone position. Betadine prep of proposed entry site performed.   NEEDLE PLACEMENT AT: Left L 2 lumbar facet (medial branch block). Under fluoroscopic guidance with oblique  orientation of 15 degrees, a 22-gauge needle was inserted at the L 2 vertebral body level with needle placed at the targeted area of Burton's Eye or Eye of the [place] with documentation of needle placement in the superior and lateral border of targeted area of Burton's Eye or Eye of the Scotty Dog with oblique orientation of 15 degrees. Following documentation of needle placement at the L 2 vertebral body level, needle placement was then accomplished at the L 3 vertebral body level.   NEEDLE PLACEMENT AT L3, L4, and L5 VERTEBRAL BODY LEVELS ON THE LEFT SIDE The procedure was performed at the L3, L4, and L5 vertebral body levels exactly as was performed at the L 2 vertebral body level utilizing the same technique and under fluoroscopic guidance.  NEEDLE PLACEMENT AT THE SACRAL ALA with AP view of the lumbosacral spine. With the patient in the prone position, Betadine prep of proposed entry site accomplished, a 22 gauge needle was inserted in the region of the sacral ala (groove formed by the superior articulating process of S1 and the sacral wing). Following documentation of needle placement at the sacral ala,   Needle placement was then verified at all levels on lateral view. Following documentation of needle placement at all levels on lateral view and following negative aspiration for heme and CSF, each level was injected with 1 mL of 0.25% bupivacaine with Kenalog.     LUMBAR FACET, MEDIAL BRANCH NERVE, BLOCKS PERFORMED ON THE RIGHT SIDE   The procedure was performed on the right side exactly as was performed on the left side at the same levels and utilizing the same technique under fluoroscopic guidance.  Myoneural block injection of the left shoulder  Following Betadine prep of proposed entry site a 22-gauge needle was inserted into the region of the left shoulder and following negative aspiration a total of 2 cc of 0.25% bupivacaine was injected into the region of the left shoulder.     The patient tolerated the procedure well.   A total of 40 mg of Kenalog was utilized for the procedure.   PLAN:  1. Medications: The patient will continue presently prescribed medications. 2. May consider modification of treatment regimen at time of return appointment pending response to treatment rendered on today's visit. 3. The patient is to follow-up with primary care physician Dr.M Soles  for further evaluation of blood pressure and general medical condition status post steroid injection performed on today's visit. 4. Surgical follow-up evaluation.Has been addressed  5. Neurological follow-up evaluation.May consider [facility] EMG studies and other studiese patient may be candidate for radiofrequency procedures, implantation type procedures, and other treatment pending response to treatment and follow-up evaluation. 6. The patient has been advised to call [facility] prior to scheduled return appointment should there be significant change in condition or should patient have other concerns regarding condition prior to scheduled return appointment.  The patient is understanding and in agreement with suggested treatment plan.   Review of Systems     Objective:   Physical Exam        Assessment & Plan:

## 2016-04-28 NOTE — Progress Notes (Signed)
Patient here for procedure treatment.   Safety precautions to be maintained throughout the outpatient stay will include: orient to surroundings, keep bed in low position, maintain call bell within reach at all times, provide assistance with transfer out of bed and ambulation.

## 2016-04-28 NOTE — Patient Instructions (Addendum)
Pain Management Discharge Instructions  General Discharge Instructions :  If you need to reach your doctor call: Monday-Friday 8:00 am - 4:00 pm at [phone] or toll free [phone].  After clinic hours [phone] to have operator reach doctor.  Bring all of your medication bottles to all your appointments in the pain clinic.  To cancel or reschedule your appointment with Pain Management please remember to call 24 hours in advance to avoid a fee.  Refer to the educational materials which you have been given on: General Risks, I had my Procedure. Discharge Instructions, Post Sedation.  Post Procedure Instructions:  The drugs you were given will stay in your system until tomorrow, so for the next 24 hours you should not drive, make any legal decisions or drink any alcoholic beverages.  You may eat anything you prefer, but it is better to start with liquids then soups and crackers, and gradually work up to solid foods.  Please notify your doctor immediately if you have any unusual bleeding, trouble breathing or pain that is not related to your normal pain.  Depending on the type of procedure that was done, some parts of your body may feel week and/or numb.  This usually clears up by tonight or the next day.  Walk with the use of an assistive device or accompanied by an adult for the 24 hours.  You may use ice on the affected area for the first 24 hours.  Put ice in a Ziploc bag and cover with a towel and place against area 15 minutes on 15 minutes off.  You may switch to heat after 24 hours.  A prescription for FLEXERIL was given to you today.GENERAL RISKS AND COMPLICATIONS  What are the risk, side effects and possible complications? Generally speaking, most procedures are safe.  However, with any procedure there are risks, side effects, and the possibility of complications.  The risks and complications are dependent upon the sites that are lesioned, or the type of nerve block  to be performed.  The closer the procedure is to the spine, the more serious the risks are.  Great care is taken when placing the radio frequency needles, block needles or lesioning probes, but sometimes complications can occur. 1. Infection: Any time there is an injection through the skin, there is a risk of infection.  This is why sterile conditions are used for these blocks.  There are four possible types of infection. 1. Localized skin infection. 2. Central Nervous System Infection-This can be in the form of Meningitis, which can be deadly. 3. Epidural Infections-This can be in the form of an epidural abscess, which can cause pressure inside of the spine, causing compression of the spinal cord with subsequent paralysis. This would require an emergency surgery to decompress, and there are no guarantees that the patient would recover from the paralysis. 4. Discitis-This is an infection of the intervertebral discs.  It occurs in about 1% of discography procedures.  It is difficult to treat and it may lead to surgery.        2. Pain: the needles have to go through skin and soft tissues, will cause soreness.       3. Damage to internal structures:  The nerves to be lesioned may be near blood vessels or    other nerves which can be potentially damaged.       4. Bleeding: Bleeding is more common if the patient is taking blood thinners such as  aspirin, Coumadin, Ticiid, Plavix,  etc., or if he/she have some genetic predisposition  such as hemophilia. Bleeding into the spinal canal can cause compression of the spinal  cord with subsequent paralysis.  This would require an emergency surgery to  decompress and there are no guarantees that the patient would recover from the  paralysis.       5. Pneumothorax:  Puncturing of a lung is a possibility, every time a needle is introduced in  the area of the chest or upper back.  Pneumothorax refers to free air around the  collapsed lung(s), inside of the thoracic cavity  (chest cavity).  Another two possible  complications related to a similar event would include: Hemothorax and Chylothorax.   These are variations of the Pneumothorax, where instead of air around the collapsed  lung(s), you may have blood or chyle, respectively.       6. Spinal headaches: They may occur with any procedures in the area of the spine.       7. Persistent CSF (Cerebro-Spinal Fluid) leakage: This is a rare problem, but may occur  with prolonged intrathecal or epidural catheters either due to the formation of a fistulous  track or a dural tear.       8. Nerve damage: By working so close to the spinal cord, there is always a possibility of  nerve damage, which could be as serious as a permanent spinal cord injury with  paralysis.       9. Death:  Although rare, severe deadly allergic reactions known as "Anaphylactic  reaction" can occur to any of the medications used.      10. Worsening of the symptoms:  We can always make thing worse.  What are the chances of something like this happening? Chances of any of this occuring are extremely low.  By statistics, you have more of a chance of getting killed in a motor vehicle accident: while driving to the hospital than any of the above occurring .  Nevertheless, you should be aware that they are possibilities.  In general, it is similar to taking a shower.  Everybody knows that you can slip, hit your head and get killed.  Does that mean that you should not shower again?  Nevertheless always keep in mind that statistics do not mean anything if you happen to be on the wrong side of them.  Even if a procedure has a 1 (one) in a 1,000,000 (million) chance of going wrong, it you happen to be that one..Also, keep in mind that by statistics, you have more of a chance of having something go wrong when taking medications.  Who should not have this procedure? If you are on a blood thinning medication (e.g. Coumadin, Plavix, see list of "Blood Thinners"), or if  you have an active infection going on, you should not have the procedure.  If you are taking any blood thinners, please inform your physician.  How should I prepare for this procedure?  Do not eat or drink anything at least six hours prior to the procedure.  Bring a driver with you .  It cannot be a taxi.  Come accompanied by an adult that can drive you back, and that is strong enough to help you if your legs get weak or numb from the local anesthetic.  Take all of your medicines the morning of the procedure with just enough water to swallow them.  If you have diabetes, make sure that you are scheduled to have your procedure done first  thing in the morning, whenever possible.  If you have diabetes, take only half of your insulin dose and notify our nurse that you have done so as soon as you arrive at the clinic.  If you are diabetic, but only take blood sugar pills (oral hypoglycemic), then do not take them on the morning of your procedure.  You may take them after you have had the procedure.  Do not take aspirin or any aspirin-containing medications, at least eleven (11) days prior to the procedure.  They may prolong bleeding.  Wear loose fitting clothing that may be easy to take off and that you would not mind if it got stained with Betadine or blood.  Do not wear any jewelry or perfume  Remove any nail coloring.  It will interfere with some of our monitoring equipment.  NOTE: Remember that this is not meant to be interpreted as a complete list of all possible complications.  Unforeseen problems may occur.  BLOOD THINNERS The following drugs contain aspirin or other products, which can cause increased bleeding during surgery and should not be taken for 2 weeks prior to and 1 week after surgery.  If you should need take something for relief of minor pain, you may take acetaminophen which is found in Tylenol,m Datril, Anacin-3 and Panadol. It is not blood thinner. The products listed  below are.  Do not take any of the products listed below in addition to any listed on your instruction sheet.  A.P.C or A.P.C with Codeine Codeine Phosphate Capsules #3 Ibuprofen Ridaura  ABC compound Congesprin Imuran rimadil  Advil Cope Indocin Robaxisal  Alka-Seltzer Effervescent Pain Reliever and Antacid Coricidin or Coricidin-D  Indomethacin Rufen  Alka-Seltzer plus Cold Medicine Cosprin Ketoprofen S-A-C Tablets  Anacin Analgesic Tablets or Capsules Coumadin Korlgesic Salflex  Anacin Extra Strength Analgesic tablets or capsules CP-2 Tablets Lanoril Salicylate  Anaprox Cuprimine Capsules Levenox Salocol  Anexsia-D Dalteparin Magan Salsalate  Anodynos Darvon compound Magnesium Salicylate Sine-off  Ansaid Dasin Capsules Magsal Sodium Salicylate  Anturane Depen Capsules Marnal Soma  APF Arthritis pain formula Dewitt's Pills Measurin Stanback  Argesic Dia-Gesic Meclofenamic Sulfinpyrazone  Arthritis Bayer Timed Release Aspirin Diclofenac Meclomen Sulindac  Arthritis pain formula Anacin Dicumarol Medipren Supac  Analgesic (Safety coated) Arthralgen Diffunasal Mefanamic Suprofen  Arthritis Strength Bufferin Dihydrocodeine Mepro Compound Suprol  Arthropan liquid Dopirydamole Methcarbomol with Aspirin Synalgos  ASA tablets/Enseals Disalcid Micrainin Tagament  Ascriptin Doan's Midol Talwin  Ascriptin A/D Dolene Mobidin Tanderil  Ascriptin Extra Strength Dolobid Moblgesic Ticlid  Ascriptin with Codeine Doloprin or Doloprin with Codeine Momentum Tolectin  Asperbuf Duoprin Mono-gesic Trendar  Aspergum Duradyne Motrin or Motrin IB Triminicin  Aspirin plain, buffered or enteric coated Durasal Myochrisine Trigesic  Aspirin Suppositories Easprin Nalfon Trillsate  Aspirin with Codeine Ecotrin Regular or Extra Strength Naprosyn Uracel  Atromid-S Efficin Naproxen Ursinus  Auranofin Capsules Elmiron Neocylate Vanquish  Axotal Emagrin Norgesic Verin  Azathioprine Empirin or Empirin with Codeine  Normiflo Vitamin E  Azolid Emprazil Nuprin Voltaren  Bayer Aspirin plain, buffered or children's or timed BC Tablets or powders Encaprin Orgaran Warfarin Sodium  Buff-a-Comp Enoxaparin Orudis Zorpin  Buff-a-Comp with Codeine Equegesic Os-Cal-Gesic   Buffaprin Excedrin plain, buffered or Extra Strength Oxalid   Bufferin Arthritis Strength Feldene Oxphenbutazone   Bufferin plain or Extra Strength Feldene Capsules Oxycodone with Aspirin   Bufferin with Codeine Fenoprofen Fenoprofen Pabalate or Pabalate-SF   Buffets II Flogesic Panagesic   Buffinol plain or Extra Strength Florinal or Florinal with Codeine Panwarfarin  Buf-Tabs Flurbiprofen Penicillamine   Butalbital Compound Four-way cold tablets Penicillin   Butazolidin Fragmin Pepto-Bismol   Carbenicillin Geminisyn Percodan   Carna Arthritis Reliever Geopen Persantine   Carprofen Gold's salt Persistin   Chloramphenicol Goody's Phenylbutazone   Chloromycetin Haltrain Piroxlcam   Clmetidine heparin Plaquenil   Cllnoril Hyco-pap Ponstel   Clofibrate Hydroxy chloroquine Propoxyphen         Before stopping any of these medications, be sure to consult the physician who ordered them.  Some, such as Coumadin (Warfarin) are ordered to prevent or treat serious conditions such as "deep thrombosis", "pumonary embolisms", and other heart problems.  The amount of time that you may need off of the medication may also vary with the medication and the reason for which you were taking it.  If you are taking any of these medications, please make sure you notify your pain physician before you undergo any procedures.         Facet Joint Block, Care After Refer to this sheet in the next few weeks. These instructions provide you with information on caring for yourself after your procedure. Your health care provider may also give you more specific instructions. Your treatment has been planned according to current medical practices, but problems sometimes  occur. Call your health care provider if you have any problems or questions after your procedure. HOME CARE INSTRUCTIONS  2. Keep track of the amount of pain relief you feel and how long it lasts. 3. Limit pain medicine within the first 4-6 hours after the procedure as directed by your health care provider. 4. Resume taking dietary supplements and medicines as directed by your health care provider. 5. You may resume your regular diet. 6. Do not apply heat near or over the injection site(s) for 24 hours.  7. Do not take a bath or soak in water (such as a pool or lake) for 24 hours. 8. Do not drive for 24 hours unless approved by your health care provider. 9. Avoid strenuous activity for 24 hours. 10. Remove your bandages the morning after the procedure.  11. If the injection site is tender, applying an ice pack may relieve some tenderness. To do this: 1. Put ice in a bag. 2. Place a towel between your skin and the bag. 3. Leave the ice on for 15-20 minutes, 3-4 times a day. 12. Keep follow-up appointments as directed by your health care provider. SEEK MEDICAL CARE IF:   Your pain is not controlled by your medicines.   There is drainage from the injection site.   There is significant bleeding or swelling at the injection site.  You have diabetes and your blood sugar is above 180 mg/dL. SEEK IMMEDIATE MEDICAL CARE IF:   You develop a fever of 101F (38.3C) or greater.   You have worsening pain or swelling around the injection site.   You have red streaking around the injection site.   You develop severe pain that is not controlled by your medicines.   You develop a headache, stiff neck, nausea, or vomiting.   Your eyes become very sensitive to light.   You have weakness, paralysis, or tingling in your arms or legs that was not present before the procedure.   You develop difficulty urinating or breathing.    This information is not intended to replace advice given  to you by your health care provider. Make sure you discuss any questions you have with your health care provider.   Document Released: 11/10/2012 Document  Revised: 12/15/2014 Document Reviewed: 11/10/2012 Elsevier Interactive Patient Education 2016 [employer] Facet Joint Block The facet joints connect the bones of the spine (vertebrae). They make it possible for you to bend, twist, and make other movements with your spine. They also prevent you from overbending, overtwisting, and making other excessive movements.  A facet joint block is a procedure where a numbing medicine (anesthetic) is injected into a facet joint. Often, a type of anti-inflammatory medicine called a steroid is also injected. A facet joint block may be done for two reasons:  13. Diagnosis. A facet joint block may be done as a test to see whether neck or back pain is caused by a worn-down or infected facet joint. If the pain gets better after a facet joint block, it means the pain is probably coming from the facet joint. If the pain does not get better, it means the pain is probably not coming from the facet joint.  14. Therapy. A facet joint block may be done to relieve neck or back pain caused by a facet joint. A facet joint block is only done as a therapy if the pain does not improve with medicine, exercise programs, physical therapy, and other forms of pain management. LET [facility] CARE PROVIDER KNOW ABOUT:   Any allergies you have.   All medicines you are taking, including vitamins, herbs, eyedrops, and over-the-counter medicines and creams.   Previous problems you or members of your family have had with the use of anesthetics.   Any blood disorders you have had.   Other health problems you have. RISKS AND COMPLICATIONS Generally, having a facet joint block is safe. However, as with any procedure, complications can occur. Possible complications associated with having a facet joint block include:   Bleeding.    Injury to a nerve near the injection site.   Pain at the injection site.   Weakness or numbness in areas controlled by nerves near the injection site.   Infection.   Temporary fluid retention.   Allergic reaction to anesthetics or medicines used during the procedure. BEFORE THE PROCEDURE   Follow your health care provider's instructions if you are taking dietary supplements or medicines. You may need to stop taking them or reduce your dosage.   Do not take any new dietary supplements or medicines without asking your health care provider first.   Follow your health care provider's instructions about eating and drinking before the procedure. You may need to stop eating and drinking several hours before the procedure.   Arrange to have an adult drive you home after the procedure. PROCEDURE 12. You may need to remove your clothing and dress in an open-back gown so that your health care provider can access your spine.  13. The procedure will be done while you are lying on an X-ray table. Most of the time you will be asked to lie on your stomach, but you may be asked to lie in a different position if an injection will be made in your neck.  14. Special machines will be used to monitor your oxygen levels, heart rate, and blood pressure.  15. If an injection will be made in your neck, an intravenous (IV) tube will be inserted into one of your veins. Fluids and medicine will flow directly into your body through the IV tube.  16. The area over the facet joint where the injection will be made will be cleaned with an antiseptic soap. The surrounding skin will be covered with  sterile drapes.  17. An anesthetic will be applied to your skin to make the injection area numb. You may feel a temporary stinging or burning sensation.  18. A video X-ray machine will be used to locate the joint. A contrast dye may be injected into the facet joint area to help with locating the joint.   19. When the joint is located, an anesthetic medicine will be injected into the joint through the needle.  65. Your health care provider will ask you whether you feel pain relief. If you do feel relief, a steroid may be injected to provide pain relief for a longer period of time. If you do not feel relief or feel only partial relief, additional injections of an anesthetic may be made in other facet joints.  21. The needle will be removed, the skin will be cleansed, and bandages will be applied.  AFTER THE PROCEDURE   You will be observed for 15-30 minutes before being allowed to go home. Do not drive. Have an adult drive you or take a taxi or public transportation instead.   If you feel pain relief, the pain will return in several hours or days when the anesthetic wears off.   You may feel pain relief 2-14 days after the procedure. The amount of time this relief lasts varies from person to person.   It is normal to feel some tenderness over the injected area(s) for 2 days following the procedure.   If you have diabetes, you may have a temporary increase in blood sugar.   This information is not intended to replace advice given to you by your health care provider. Make sure you discuss any questions you have with your health care provider.   Document Released: 04/15/2007 Document Revised: 12/15/2014 Document Reviewed: 09/13/2012 Elsevier Interactive Patient Education [employer].

## 2016-04-29 ENCOUNTER — Telehealth: Payer: Self-pay | Admitting: Pain Medicine

## 2016-04-29 ENCOUNTER — Telehealth: Payer: Self-pay | Admitting: *Deleted

## 2016-04-29 NOTE — Telephone Encounter (Signed)
Dr. Primus Bravo -please advise     Thanks-Jennifer

## 2016-04-29 NOTE — Telephone Encounter (Signed)
Nurses, Please discuss patient's symptoms with me. The patient may need to address limitations of activity with her primary care physician. Please discuss details with me today so that we can address her concerns

## 2016-04-29 NOTE — Telephone Encounter (Signed)
No problems; wanted to know about time on next appointment which is on her discharge sheet.

## 2016-04-29 NOTE — Telephone Encounter (Signed)
Left voicemail for patient to return call so that we may discuss her issue.

## 2016-04-29 NOTE — Telephone Encounter (Signed)
Left voicemail re; procedure on yesterday, to call if there are questions or concerns.  

## 2016-04-29 NOTE — Telephone Encounter (Signed)
Patient returned call to clinic, had procedure and legs are hurting a little bit, wants to know if dr. Primus Bravo will write her a letter stating he doesn't want her to lift or push or pull anything so she can get some assistance

## 2016-04-30 ENCOUNTER — Telehealth: Payer: Self-pay | Admitting: *Deleted

## 2016-04-30 NOTE — Telephone Encounter (Signed)
Kristina Li to make her aware of Dr Primus Bravo response re; functional capacity evaluation.  Will further discuss at her next appt.  Patient verbalizes u/o information

## 2016-04-30 NOTE — Telephone Encounter (Signed)
Nurses Please inform patient that she needs to undergo surgical evaluation for functional capacity evaluation to evaluate her physical capabilities including pushing, pulling, and other activities. Please inform patient that we need to discuss scheduling her for a surgical evaluation for a functional capacity evaluation at time of her return appointment if she wishes to proceed with such evaluation to address her physical impairments

## 2016-04-30 NOTE — Telephone Encounter (Signed)
Spoke with patient re; previous phone call needing a letter from Dr Primus Bravo, stating that she can't push, pull or lift anything.  She states that this was discussed at her last visit.  She states that mother has been assisting her with her daily activities but she is unsure if she will be able to continue and would like to try and obtain some help with daily chores.  Patient has an appt 05/06/16.  Told patient that I would let Dr Primus Bravo know this information.

## 2016-04-30 NOTE — Telephone Encounter (Signed)
Spoke with patient re; previous message for clarification.  Routed to Dr Primus Bravo.

## 2016-04-30 NOTE — Telephone Encounter (Signed)
Called patient back re; Dr Primus Bravo remarks re; functional capacity evaluation.

## 2016-05-06 ENCOUNTER — Ambulatory Visit: Payer: Medicaid Other | Attending: Pain Medicine | Admitting: Pain Medicine

## 2016-05-06 ENCOUNTER — Encounter: Payer: Self-pay | Admitting: Pain Medicine

## 2016-05-06 VITALS — BP 110/88 | HR 88 | Temp 98.1°F | Resp 18 | Ht 66.0 in | Wt 150.0 lb

## 2016-05-06 DIAGNOSIS — R5382 Chronic fatigue, unspecified: Secondary | ICD-10-CM | POA: Insufficient documentation

## 2016-05-06 DIAGNOSIS — G5790 Unspecified mononeuropathy of unspecified lower limb: Secondary | ICD-10-CM

## 2016-05-06 DIAGNOSIS — M533 Sacrococcygeal disorders, not elsewhere classified: Secondary | ICD-10-CM

## 2016-05-06 DIAGNOSIS — M19012 Primary osteoarthritis, left shoulder: Secondary | ICD-10-CM

## 2016-05-06 DIAGNOSIS — M792 Neuralgia and neuritis, unspecified: Secondary | ICD-10-CM | POA: Diagnosis not present

## 2016-05-06 DIAGNOSIS — M1610 Unilateral primary osteoarthritis, unspecified hip: Secondary | ICD-10-CM | POA: Insufficient documentation

## 2016-05-06 DIAGNOSIS — M19019 Primary osteoarthritis, unspecified shoulder: Secondary | ICD-10-CM | POA: Diagnosis not present

## 2016-05-06 DIAGNOSIS — M542 Cervicalgia: Secondary | ICD-10-CM | POA: Diagnosis not present

## 2016-05-06 DIAGNOSIS — Z85850 Personal history of malignant neoplasm of thyroid: Secondary | ICD-10-CM | POA: Diagnosis not present

## 2016-05-06 DIAGNOSIS — N809 Endometriosis, unspecified: Secondary | ICD-10-CM | POA: Insufficient documentation

## 2016-05-06 DIAGNOSIS — M4186 Other forms of scoliosis, lumbar region: Secondary | ICD-10-CM | POA: Diagnosis not present

## 2016-05-06 DIAGNOSIS — M5136 Other intervertebral disc degeneration, lumbar region: Secondary | ICD-10-CM | POA: Diagnosis not present

## 2016-05-06 DIAGNOSIS — M19011 Primary osteoarthritis, right shoulder: Secondary | ICD-10-CM

## 2016-05-06 DIAGNOSIS — M7590 Shoulder lesion, unspecified, unspecified shoulder: Secondary | ICD-10-CM | POA: Diagnosis not present

## 2016-05-06 DIAGNOSIS — M25511 Pain in right shoulder: Secondary | ICD-10-CM | POA: Diagnosis present

## 2016-05-06 DIAGNOSIS — M5481 Occipital neuralgia: Secondary | ICD-10-CM

## 2016-05-06 DIAGNOSIS — M7060 Trochanteric bursitis, unspecified hip: Secondary | ICD-10-CM | POA: Diagnosis not present

## 2016-05-06 DIAGNOSIS — M25512 Pain in left shoulder: Secondary | ICD-10-CM | POA: Diagnosis present

## 2016-05-06 DIAGNOSIS — M7918 Myalgia, other site: Secondary | ICD-10-CM

## 2016-05-06 DIAGNOSIS — M797 Fibromyalgia: Secondary | ICD-10-CM | POA: Diagnosis not present

## 2016-05-06 MED ORDER — CYCLOBENZAPRINE HCL 5 MG PO TABS: ORAL_TABLET | ORAL | Status: DC

## 2016-05-06 MED ORDER — TRAMADOL HCL 50 MG PO TABS: ORAL_TABLET | ORAL | Status: DC

## 2016-05-06 MED ORDER — CYCLOBENZAPRINE HCL 10 MG PO TABS: ORAL_TABLET | ORAL | Status: DC

## 2016-05-06 NOTE — Progress Notes (Signed)
Subjective:    Patient ID: Kristina Li, female    DOB: February 24, 1981, 35 y.o.   MRN: [id number]  HPI  The patient is a 35 year old female who returns to pain management for further evaluation and treatment of pain involving the left shoulder greater than the right shoulder as well as the upper mid lower back and lower extremity regions. The patient states that she had significant improvement of lower back lower extremity pain following lumbar facet, medial branch nerve, blocks. The patient continues to have pain involving the left shoulder. We discussed patient's condition and will obtain MRI of the left shoulder. We will also address patient's medications and patient will begin taking tramadol caution to avoid excessive sedation confusion and other unresolved side effects as discussed with patient on today's visit. The patient also will continue Flexeril with caution to avoid excessive sedation and respiratory depression confusion and other side effects. We will continue to observe patient's response to lumbar facet, medial branch nerve, proximal consider modification of treatment pending follow-up evaluation. All agreed to suggested treatment plan  Review of Systems     Objective:   Physical Exam  There was tenderness of the splenius capitis and occipitalis musculature regions palpation which be produced pain of moderate degree on the right and moderate to moderately severe degree on the left. There was limited range of motion of the acromioclavicular joint region and patient appeared to be with slightly decreased grip strength on the left compared to the right. There was unremarkable Spurling's maneuver. Palpation over the cervical and thoracic facet regions reproduced pain of moderate degree without crepitus of the thoracic region noted. Palpation over the lumbar paraspinal musculatures and lumbar facet region was attends to palpation of mild degree with lateral bending rotation extension  and palpation of the lumbar facets reproducing mild discomfort. There was mild tenderness of the PSIS and PII S region as well as the gluteal and piriformis musculature region. Straight leg raising was tolerated to 30 without increased pain with dorsiflexion noted. DTRs appeared to be trace at the knees. There was no sensory deficit or dermatomal distribution detected. There was negative clonus negative Homans. Abdomen nontender with no costovertebral tenderness noted.      Assessment & Plan:       Degenerative disc disease of the lumbar spine  Mild lumbar levoscoliosis is again seen with apex at L3. There is no listhesis. Vertebral body heights are preserved. Intervertebral discs are well hydrated and maintained in height. No significant vertebral marrow edema is seen. The conus medullaris is normal in signal and terminates at L1-2. Paraspinal soft tissues are unremarkable.  There is mild lower lumbar facet arthrosis bilaterally from L3-4 to L5-S1, greatest at L4-5. No disc herniation, spinal stenosis, or neural foraminal stenosis is identified.  IMPRESSION: 1. Mild lower lumbar facet arthrosis. No disc herniation or stenosis. 2. Mild lumbar levoscoliosis.  Lumbar facet syndrome  Sacroiliac joint dysfunction  Degenerative joint disease of shoulder  Tendinitis of shoulder  Bilateral occipital neuralgia  Cervicalgia  Ilioinguinal neuralgia  Fibromyalgia  Chronic fatigue syndrome  Myofascial pain syndrome  History of surgery for thyroid cancer  Endometriosis  Degenerative joint disease of hip  Greater trochanteric bursitis     PLAN   Continue present medications continue Flexeril and tramadol. Caution tramadol and Flexeril ( cyclobenzaprine )can cause respiratory depression, excessive sedation, confusion, and other side effects  .Exercise extreme caution when taking these medications to avoid undesirable side effects as discussed  F/U PCP Dr.  Sabino Snipes  for evaliation of  BP and general medical  condition  Rheumatological evaluation as discussed  Psych follow-up evaluation as scheduled  Vascular follow-up evaluation as needed  Ask the nurses and secretary the date of MRI of your left shoulder  F/U surgical evaluation. May consider pending follow-up evaluations  F/U neurological evaluation. May consider pending follow-up evaluations  May consider radiofrequency rhizolysis or intraspinal procedures pending response to present treatment and F/U evaluation   Patient  Is to call [facility] should patient have concerns prior to scheduled return appointment.

## 2016-05-06 NOTE — Progress Notes (Signed)
Safety precautions to be maintained throughout the outpatient stay will include: orient to surroundings, keep bed in low position, maintain call bell within reach at all times, provide assistance with transfer out of bed and ambulation.  

## 2016-05-06 NOTE — Patient Instructions (Addendum)
PLAN   Continue present medications continue Flexeril and tramadol. Caution tramadol and Flexeril ( cyclobenzaprine )can cause respiratory depression, excessive sedation, confusion, and other side effects  .Exercise extreme caution when taking these medications to avoid undesirable side effects as discussed  F/U PCP Dr. Sabino Snipes  for evaliation of  BP and general medical  condition  Rheumatological evaluation as discussed  Psych follow-up evaluation as scheduled  Vascular follow-up evaluation as needed  Ask the nurses and secretary the date of MRI of your left shoulder  F/U surgical evaluation. May consider pending follow-up evaluations  F/U neurological evaluation. May consider pending follow-up evaluations  May consider radiofrequency rhizolysis or intraspinal procedures pending response to present treatment and F/U evaluation   Patient  Is to call [facility] should patient have concerns prior to scheduled return appointment.  Patient cautioned regarding side effects of Tramadol and Flexeril - drowsiness and respiratory depression.  DO NOT overtake medications.  Take as prescribed.

## 2016-05-15 ENCOUNTER — Encounter: Payer: Self-pay | Admitting: Pain Medicine

## 2016-05-15 ENCOUNTER — Ambulatory Visit: Payer: Medicaid Other | Attending: Pain Medicine | Admitting: Pain Medicine

## 2016-05-15 VITALS — BP 118/73 | HR 82 | Temp 97.8°F | Resp 14 | Ht 66.5 in | Wt 150.0 lb

## 2016-05-15 DIAGNOSIS — M797 Fibromyalgia: Secondary | ICD-10-CM | POA: Diagnosis not present

## 2016-05-15 DIAGNOSIS — M7590 Shoulder lesion, unspecified, unspecified shoulder: Secondary | ICD-10-CM | POA: Diagnosis not present

## 2016-05-15 DIAGNOSIS — M4186 Other forms of scoliosis, lumbar region: Secondary | ICD-10-CM | POA: Insufficient documentation

## 2016-05-15 DIAGNOSIS — M5481 Occipital neuralgia: Secondary | ICD-10-CM

## 2016-05-15 DIAGNOSIS — M546 Pain in thoracic spine: Secondary | ICD-10-CM | POA: Diagnosis present

## 2016-05-15 DIAGNOSIS — M542 Cervicalgia: Secondary | ICD-10-CM

## 2016-05-15 DIAGNOSIS — M792 Neuralgia and neuritis, unspecified: Secondary | ICD-10-CM | POA: Insufficient documentation

## 2016-05-15 DIAGNOSIS — M7918 Myalgia, other site: Secondary | ICD-10-CM

## 2016-05-15 DIAGNOSIS — R51 Headache: Secondary | ICD-10-CM | POA: Insufficient documentation

## 2016-05-15 DIAGNOSIS — M5136 Other intervertebral disc degeneration, lumbar region: Secondary | ICD-10-CM | POA: Insufficient documentation

## 2016-05-15 DIAGNOSIS — M19019 Primary osteoarthritis, unspecified shoulder: Secondary | ICD-10-CM | POA: Diagnosis not present

## 2016-05-15 DIAGNOSIS — M19012 Primary osteoarthritis, left shoulder: Secondary | ICD-10-CM

## 2016-05-15 DIAGNOSIS — G5790 Unspecified mononeuropathy of unspecified lower limb: Secondary | ICD-10-CM

## 2016-05-15 DIAGNOSIS — M19011 Primary osteoarthritis, right shoulder: Secondary | ICD-10-CM

## 2016-05-15 DIAGNOSIS — M533 Sacrococcygeal disorders, not elsewhere classified: Secondary | ICD-10-CM

## 2016-05-15 NOTE — Patient Instructions (Addendum)
PLAN   Continue present medications continue Flexeril and tramadol. Caution tramadol and Flexeril ( cyclobenzaprine )can cause respiratory depression, excessive sedation, confusion, and other side effects  .Exercise extreme caution when taking these medications to avoid undesirable side effects as discussed  Greater occipital nerve block to be performed at time of return appointment  F/U PCP Dr. Sabino Snipes  for evaliation of  BP and general medical  condition  Rheumatological evaluation as discussed  Psych follow-up evaluation as scheduled  Vascular follow-up evaluation as needed  Ask the nurses and secretary the date of MRI of your left shoulder  F/U surgical evaluation. May consider pending follow-up evaluations  F/U neurological evaluation. May consider pending follow-up evaluations  May consider radiofrequency rhizolysis or intraspinal procedures pending response to present treatment and F/U evaluation   Patient  Is to call [facility] should patient have concerns prior to scheduled return appointment.  Pt given preprocedure insturctions.  Take metoprolol morning of procedure with sips of water.  Bring driver.  Information sheet for greater occipital nerve block

## 2016-05-15 NOTE — Progress Notes (Signed)
Subjective:    Patient ID: Kristina Li, female    DOB: [birth year], 35 y.o.   MRN: [id number]  HPI  The patient is a 35 year old female who returns to pain management for further evaluation and treatment of pain involving the region of the shoulder back and lower extremity regions. The patient is quite pleased with the amount of relief she obtained previous lumbar facet, medial branch nerve blocks. The patient states that she is with pain occurring the back of the neck radiating to the back of the hip precipitating headaches with some pain occurring in the region of the shoulder of mild-to-moderate degree The patient also admits to some mild pain involving the lower back and lower region of the buttocks. The patient was without trauma change in events of daily living the call significant change in symptomatology. We will consider patient for greater occipital nerve block to be performed at time return appointment in attempt to decrease severity of headache, minimize progression of symptoms, and avoid the need for more involved treatment. We will also perform myoneural block injections in the region of the shoulder for pain occurring in the surrounding musculature region of the shoulder. The patient was with understanding and agreement with suggested treatment plan  Review of Systems     Objective:   Physical Exam  There was tenderness of the paraspinal musculature region of the splenius capitis and occipitalis region a moderate degree. There were no new masses of the head and neck noted. There was tenderness to palpation of the cervical facet cervical paraspinal musculature region a moderate degree with moderate tenderness and muscle spasm involving the trapezius levator scapula and rhomboid musculature regions. Palpation of the acromioclavicular and glenohumeral joint regions reproduce moderate discomfort and there was mild difficulty performing drop test. Patient appeared to be with  slightly decreased grip strength and Tinel and Phalen's maneuver were without increased pain of significant degree. Palpation over the region of the thoracic region thoracic facet region was attends to palpation of moderate degree without crepitus of the thoracic region noted. Palpation over the lumbar paraspinal musculatures and lumbar facet region was attends to palpation of mild degree with lateral bending rotation extension and palpation of the lumbar facets reproducing mild discomfort. Palpation over the PSIS and PII S regions reproduce mild discomfort as well. There was mild tenderness of the greater trochanteric region iliotibial band region. Straight leg raising was tolerates approximately 30 without increased pain with dorsiflexion noted. DTRs appeared to be trace at the knees and there was negative clonus negative Homans. Abdomen was without tenderness to palpation and no costovertebral tenderness was noted      Assessment & Plan:         Bilateral occipital neuralgia  Tendinitis of shoulder  Degenerative disc disease of the lumbar spine  Mild lumbar levoscoliosis is again seen with apex at L3. There is no listhesis. Vertebral body heights are preserved. Intervertebral discs are well hydrated and maintained in height. No significant vertebral marrow edema is seen. The conus medullaris is normal in signal and terminates at L1-2. Paraspinal soft tissues are unremarkable.  There is mild lower lumbar facet arthrosis bilaterally from L3-4 to L5-S1, greatest at L4-5. No disc herniation, spinal stenosis, or neural foraminal stenosis is identified.  IMPRESSION: 1. Mild lower lumbar facet arthrosis. No disc herniation or stenosis. 2. Mild lumbar levoscoliosis.  Lumbar facet syndrome  Sacroiliac joint dysfunction  Degenerative joint disease of shoulder  Cervicalgia  Ilioinguinal neuralgia  Fibromyalgia  PLAN   Continue present medications continue Flexeril  and tramadol. Caution tramadol and Flexeril ( cyclobenzaprine )can cause respiratory depression, excessive sedation, confusion, and other side effects  .Exercise extreme caution when taking these medications to avoid undesirable side effects as discussed  Greater occipital nerve block to be performed at time of return appointment  F/U PCP Dr. Sabino Snipes  for evaliation of  BP and general medical  condition  Rheumatological evaluation as discussed  Psych follow-up evaluation as scheduled  Vascular follow-up evaluation as needed  Ask the nurses and secretary the date of MRI of your left shoulder  F/U surgical evaluation. May consider pending follow-up evaluations  F/U neurological evaluation. May consider pending follow-up evaluations  May consider radiofrequency rhizolysis or intraspinal procedures pending response to present treatment and F/U evaluation   Patient  Is to call [facility] should patient have concerns prior to scheduled return appointment.

## 2016-05-29 ENCOUNTER — Ambulatory Visit: Payer: Medicaid Other

## 2016-06-02 ENCOUNTER — Ambulatory Visit
Admission: RE | Admit: 2016-06-02 | Discharge: 2016-06-02 | Disposition: A | Discharge status: Home / Self Care | Payer: Medicaid Other | Source: Ambulatory Visit | Attending: Pain Medicine | Admitting: Pain Medicine

## 2016-06-02 DIAGNOSIS — G5790 Unspecified mononeuropathy of unspecified lower limb: Secondary | ICD-10-CM

## 2016-06-02 DIAGNOSIS — M19012 Primary osteoarthritis, left shoulder: Secondary | ICD-10-CM

## 2016-06-02 DIAGNOSIS — M542 Cervicalgia: Secondary | ICD-10-CM

## 2016-06-02 DIAGNOSIS — M533 Sacrococcygeal disorders, not elsewhere classified: Secondary | ICD-10-CM

## 2016-06-02 DIAGNOSIS — M797 Fibromyalgia: Secondary | ICD-10-CM

## 2016-06-02 DIAGNOSIS — M7918 Myalgia, other site: Secondary | ICD-10-CM

## 2016-06-02 DIAGNOSIS — M5481 Occipital neuralgia: Secondary | ICD-10-CM

## 2016-06-02 DIAGNOSIS — M19011 Primary osteoarthritis, right shoulder: Secondary | ICD-10-CM

## 2016-06-03 ENCOUNTER — Ambulatory Visit: Payer: Medicaid Other | Admitting: Pain Medicine

## 2016-06-04 ENCOUNTER — Encounter: Payer: Self-pay | Admitting: Pain Medicine

## 2016-06-04 ENCOUNTER — Ambulatory Visit: Payer: Medicaid Other | Attending: Pain Medicine | Admitting: Pain Medicine

## 2016-06-04 VITALS — BP 117/84 | HR 75 | Temp 98.0°F | Resp 12 | Ht 66.5 in | Wt 150.0 lb

## 2016-06-04 DIAGNOSIS — M19012 Primary osteoarthritis, left shoulder: Secondary | ICD-10-CM

## 2016-06-04 DIAGNOSIS — M533 Sacrococcygeal disorders, not elsewhere classified: Secondary | ICD-10-CM

## 2016-06-04 DIAGNOSIS — M19011 Primary osteoarthritis, right shoulder: Secondary | ICD-10-CM

## 2016-06-04 DIAGNOSIS — M542 Cervicalgia: Secondary | ICD-10-CM | POA: Diagnosis not present

## 2016-06-04 DIAGNOSIS — R51 Headache: Secondary | ICD-10-CM | POA: Insufficient documentation

## 2016-06-04 DIAGNOSIS — M5481 Occipital neuralgia: Secondary | ICD-10-CM

## 2016-06-04 DIAGNOSIS — M7918 Myalgia, other site: Secondary | ICD-10-CM

## 2016-06-04 DIAGNOSIS — M797 Fibromyalgia: Secondary | ICD-10-CM

## 2016-06-04 DIAGNOSIS — G5790 Unspecified mononeuropathy of unspecified lower limb: Secondary | ICD-10-CM

## 2016-06-04 MED ORDER — TRIAMCINOLONE ACETONIDE 40 MG/ML IJ SUSP
40.0000 mg | Freq: Once | INTRAMUSCULAR | Status: AC
  Administered 2016-06-04: 40 mg
  Filled 2016-06-04: qty 1

## 2016-06-04 MED ORDER — MIDAZOLAM HCL 5 MG/5ML IJ SOLN
5.0000 mg | Freq: Once | INTRAMUSCULAR | Status: AC
  Administered 2016-06-04: 3 mg via INTRAVENOUS
  Filled 2016-06-04: qty 5

## 2016-06-04 MED ORDER — FENTANYL CITRATE (PF) 100 MCG/2ML IJ SOLN
100.0000 ug | Freq: Once | INTRAMUSCULAR | Status: AC
  Administered 2016-06-04: 50 ug via INTRAVENOUS
  Filled 2016-06-04: qty 2

## 2016-06-04 MED ORDER — ORPHENADRINE CITRATE 30 MG/ML IJ SOLN
60.0000 mg | Freq: Once | INTRAMUSCULAR | Status: AC
  Administered 2016-06-04: 60 mg via INTRAMUSCULAR
  Filled 2016-06-04: qty 2

## 2016-06-04 MED ORDER — LIDOCAINE HCL (PF) 1 % IJ SOLN
10.0000 mL | Freq: Once | INTRAMUSCULAR | Status: DC
  Filled 2016-06-04: qty 10

## 2016-06-04 MED ORDER — BUPIVACAINE HCL (PF) 0.25 % IJ SOLN
30.0000 mL | Freq: Once | INTRAMUSCULAR | Status: AC
  Administered 2016-06-04: 30 mL
  Filled 2016-06-04: qty 30

## 2016-06-04 MED ORDER — LACTATED RINGERS IV SOLN: 1000.0000 mL | INTRAVENOUS | Status: DC

## 2016-06-04 NOTE — Progress Notes (Signed)
Subjective:    Patient ID: Kristina Li, female    DOB: [birth year], 35 y.o.   MRN: [id number]  HPI                                     BILATERAL OCCIPITAL NERVE BLOCKS  NOTE: The patient is a 35 y.o.-year-old female who returns to the [facility] for further evaluation and treatment of pain consisting of pain involving the region of the neck and headache.  Patient is A severe pain involving the back of the neck with pain radiating from the back of the neck to the back of the head. The patient is with pain which involves the upper back region and shoulders as well with pain aggravated with reaching and lifting twisting turning maneuvers of the neck. There is concern regarding headaches been due to component of greater occipital neuralgia. .  The risks, benefits, and expectations of the procedure have been discussed and explained to patient, who is understanding and wishes to proceed with interventional treatment as discussed and as explained to patient.  Will proceed with greater occipital nerve blocks with myoneural block injections at this time as discussed and as explained to patient.  All are understanding and in agreement with suggested treatment plan.    PROCEDURE:  Greater occipital nerve block on the left side with IV Versed, IV Fentanyl, conscious sedation, EKG, blood pressure, pulse, capnography, and pulse oximetry monitoring.  Procedure performed with patient in prone position.  Greater occipital nerve block on the left side.   With patient in prone position, Betadine prep of proposed entry site accomplished.  Following identification of the nuchal ridge, 22 -gauge needle was inserted at the level of the nuchal ridge medial to the occipital artery.  Following negative aspiration, 4cc 0.25% bupivacaine with Kenalog injected for left greater occipital nerve block.  Needle was removed.  Patient tolerated injection well.   Greater occipital nerve block on the rightt side.  The greater occipital nerve block on the right side was performed exactly as the left greater occipital nerve block was performed and utilizing the same technique.  Myoneural block injections of the trapezius musculature region Following Betadine prep of proposed entry site a 22-gauge needle was inserted into the trapezius musculature region and following negative aspiration 2 cc of 0.25% bupivacaine with Norflex was injected for myoneural block injection of the trapezius musculature region 4  The patient tolerated procedure well   A total of 10 mg Kenalog was utilized for the entire procedure.  PLAN:    1. Medications: Will continue presently prescribed medications Flexeril and tramadol at this time. 2. Patient to follow up with primary care physician Dr.  Caryn Bee for evaluation of blood pressure and general medical condition status post procedure performed on today's visit. 3. Neurological evaluation for further assessment of headaches and for other studies as discussed. 4. Surgical evaluation as discussed.  5. Patient may be candidate for Botox injections, radiofrequency procedures, as well as implantation type procedures pending response to treatment rendered on today's visit and pending follow-up evaluation. 6. Patient has been advised to adhere to proper body mechanics and to avoid activities which appear to aggravate condition.cations:  Will continue presently prescribed medications at this time. 7. The patient is understanding and in agreement with the suggested treatment plan.    Review of Systems     Objective:   Physical Exam  Assessment & Plan:

## 2016-06-04 NOTE — Patient Instructions (Addendum)
PLAN   Continue present medications continue Flexeril and tramadol. Caution tramadol and Flexeril ( cyclobenzaprine )can cause respiratory depression, excessive sedation, confusion, and other side effects  .Exercise extreme caution when taking these medications to avoid undesirable side effects as discussed  F/U PCP Dr. Sabino Snipes  for evaliation of  BP and general medical  condition  Rheumatological evaluation as discussed  Psych follow-up evaluation as scheduled  Vascular follow-up evaluation as needed  Ask the nurses and secretary the date of MRI of your left shoulder  F/U surgical evaluation. May consider pending follow-up evaluations  F/U neurological evaluation. May consider pending follow-up evaluations  May consider radiofrequency rhizolysis or intraspinal procedures pending response to present treatment and F/U evaluation   Patient  Is to call [facility] should patient have concerns prior to scheduled return appointment.  Occipital Nerve Block Patient Information  Description: The occipital nerves originate in the cervical (neck) spinal cord and travel upward through muscle and tissue to supply sensation to the back of the head and top of the scalp.  In addition, the nerves control some of the muscles of the scalp.  Occipital neuralgia is an irritation of these nerves which can cause headaches, numbness of the scalp, and neck discomfort.     The occipital nerve block will interrupt nerve transmission through these nerves and can relieve pain and spasm.  The block consists of insertion of a small needle under the skin in the back of the head to deposit local anesthetic (numbing medicine) and/or steroids around the nerve.  The entire block usually lasts less than 5 minutes.  Conditions which may be treated by occipital blocks:   Muscular pain and spasm of the scalp  Nerve irritation, back of the head  Headaches  Upper neck pain  Preparation for the  injection:  1. Do not eat any solid food or dairy products within 8 hours of your appointment. 2. You may drink clear liquids up to 3 hours before appointment.  Clear liquids include water, black coffee, juice or soda.  No milk or cream please. 3. You may take your regular medication, including pain medications, with a sip of water before you appointment.  Diabetics should hold regular insulin (if taken separately) and take 1/2 normal NPH dose the morning of the procedure.  Carry some sugar containing items with you to your appointment. 4. A driver must accompany you and be prepared to drive you home after your procedure. 5. Bring all your current medications with you. 6. An IV may be inserted and sedation may be given at the discretion of the physician. 7. A blood pressure cuff, EKG, and other monitors will often be applied during the procedure.  Some patients may need to have extra oxygen administered for a short period. 8. You will be asked to provide medical information, including your allergies and medications, prior to the procedure.  We must know immediately if you are taking blood thinners (like Coumadin/Warfarin) or if you are allergic to IV iodine contrast (dye).  We must know if you could possible be pregnant.  9. Do not wear a high collared shirt or turtleneck.  Tie long hair up in the back if possible.  Possible side-effects:   Bleeding from needle site  Infection (rare, may require surgery)  Nerve injury (rare)  Hair on back of neck can be tinged with iodine scrub (this will wash out)  Light-headedness (temporary)  Pain at injection site (several days)  Decreased blood pressure (rare,  temporary)  Seizure (very rare)  Call if you experience:   Hives or difficulty breathing ( go to the emergency room)  Inflammation or drainage at the injection site(s)  Please note:  Although the local anesthetic injected can often make your painful muscles or headache feel good for  several hours after the injection, the pain may return.  It takes 3-7 days for steroids to work.  You may not notice any pain relief for at least one week.  If effective, we will often do a series of injections spaced 3-6 weeks apart to maximally decrease your pain.  If you have any questions, please call [phone] [facility]  Be careful moving about. Muscle spasms in the area of the injection may occur.  Use ice for the next 24 hours (15 minutes on, 15 minutes off).  After 24 hours, you may use heat for comfort if you wish.  Post procedure numbness or redness is expected, average 4-6 hours.  If numbness develops after 4-6 hours and is felt to be progressing and worsening, immediately contact your physician.

## 2016-06-04 NOTE — Progress Notes (Signed)
Patient states she can use betadine with contrast dye allergy.

## 2016-06-04 NOTE — Progress Notes (Signed)
Safety precautions to be maintained throughout the outpatient stay will include: orient to surroundings, keep bed in low position, maintain call bell within reach at all times, provide assistance with transfer out of bed and ambulation.  

## 2016-06-05 ENCOUNTER — Telehealth: Payer: Self-pay | Admitting: *Deleted

## 2016-06-05 NOTE — Telephone Encounter (Signed)
Left voicemail with patient to call our office if there are questions or concerns re; procedure on yesterday. 

## 2016-06-18 ENCOUNTER — Telehealth: Payer: Self-pay | Admitting: *Deleted

## 2016-06-18 ENCOUNTER — Other Ambulatory Visit: Payer: Self-pay | Admitting: Pain Medicine

## 2016-06-18 ENCOUNTER — Telehealth: Payer: Self-pay

## 2016-06-18 DIAGNOSIS — M7918 Myalgia, other site: Secondary | ICD-10-CM

## 2016-06-18 DIAGNOSIS — M542 Cervicalgia: Secondary | ICD-10-CM

## 2016-06-18 DIAGNOSIS — M797 Fibromyalgia: Secondary | ICD-10-CM

## 2016-06-18 DIAGNOSIS — G5790 Unspecified mononeuropathy of unspecified lower limb: Secondary | ICD-10-CM

## 2016-06-18 DIAGNOSIS — M19011 Primary osteoarthritis, right shoulder: Secondary | ICD-10-CM

## 2016-06-18 DIAGNOSIS — M19012 Primary osteoarthritis, left shoulder: Secondary | ICD-10-CM

## 2016-06-18 DIAGNOSIS — M533 Sacrococcygeal disorders, not elsewhere classified: Secondary | ICD-10-CM

## 2016-06-18 DIAGNOSIS — M5481 Occipital neuralgia: Secondary | ICD-10-CM

## 2016-06-18 MED ORDER — DIAZEPAM 5 MG PO TABS
ORAL_TABLET | ORAL | Status: DC
Start: 2016-06-18 — End: 2016-08-22

## 2016-06-18 NOTE — Telephone Encounter (Signed)
Pt wants Dr Primus Bravo to know that she has not had her MRI yet. I originally scheduled pt for her MRI on 6/22 pt canceled appt saying she has anxiety issues but pt went through with MRI on 5/3 with out meds. Now her medicaid approval of the MRI has expired I did tell pt that I would work on getting the approval dates extended. Pt still does not seem to understand whats going on.

## 2016-06-18 NOTE — Telephone Encounter (Signed)
[place] Nurses and Angie I will prescribe Valium for patient to have MRI of the left shoulder if this will help resolve all matters I will also place another order for MRI of the left shoulder. Please let me know if I need to do anything as to assist with this matter. Thank you

## 2016-06-18 NOTE — Telephone Encounter (Signed)
voicmail left with patient.

## 2016-06-18 NOTE — Telephone Encounter (Signed)
Dr. Primus Bravo  What  Do you need nursing to do?

## 2016-06-18 NOTE — Telephone Encounter (Signed)
Voicemail left with patient will Dr Primus Bravo response about placing another order for MRI and also that he would order Valium for anxiety during the procedure.

## 2016-06-19 ENCOUNTER — Ambulatory Visit: Payer: Medicaid Other | Admitting: Pain Medicine

## 2016-07-01 ENCOUNTER — Telehealth: Payer: Self-pay | Admitting: Pain Medicine

## 2016-07-01 NOTE — Telephone Encounter (Signed)
Spoke with patient and she states that she is having left shoulder pain.  Does not feel that she has injured it.  Patient states she has difficulty taking the tramadol because it causes nausea but she continues to take the Flexeril.  Told patient that she does have an appt with Dr Primus Bravo on August 3 @ 1045 for evaluation and medication refill, which she was unaware of.  Patient states that she will discuss her pain at that time with Dr Primus Bravo.

## 2016-07-01 NOTE — Telephone Encounter (Signed)
Patient states she is having left shoulder pain and wants to know what to do ?

## 2016-07-10 ENCOUNTER — Other Ambulatory Visit: Payer: Self-pay | Admitting: Pain Medicine

## 2016-07-10 ENCOUNTER — Telehealth: Payer: Self-pay

## 2016-07-10 ENCOUNTER — Ambulatory Visit: Payer: Medicaid Other | Admitting: Pain Medicine

## 2016-07-10 NOTE — Telephone Encounter (Signed)
Called to patient to help her with confusion on her medications.  Left voicemail to call us back if she needs assistant.  Also, left voicemail with patient as to why she cancelled appt today if she was having questions or concerns re; medications.

## 2016-07-10 NOTE — Telephone Encounter (Signed)
Pt wants someone to call her concerning meds. Pt seems very confused.

## 2016-07-10 NOTE — Telephone Encounter (Signed)
Left voicemail with patient to call us back re; medication questions.

## 2016-07-22 ENCOUNTER — Ambulatory Visit: Payer: Medicaid Other

## 2016-07-24 ENCOUNTER — Telehealth: Payer: Self-pay

## 2016-07-24 NOTE — Telephone Encounter (Signed)
Pt is complaining of shoulder pain in both shoulders. Pt had a MRI scheduled but pt failed to mention she was claustrophobic. I called pt on 8/3 to let her know I got the MRI approved again. I had to leave a VM the pt still did not show to her MRI appt. This is the second time this pt has let her approval for an MRI expire. Pt has canceled last 3 appts. I did let her know the importance of keeping those MRI appts because that is how Dr. Primus Bravo will be able to help her pain. Pt does not seem to understand the importance of keeping the appts. Pt has called about the same issue since June. She seems to be very much out of it when I try to explain things to her. I will see if medicaid will approve MRI once again but normally they do not after the second time

## 2016-07-24 NOTE — Telephone Encounter (Signed)
Thank you for this information regarding the patient. The patient has missed appointments and I think that it is best for patient to come with a responsible adult whom she will allow to assist with the management of her care. We will need the patient's permission to allow the responsible adult of her choice to assist with the management of her care. I think this will be the best option at this time to try to keep the patient on schedule and to avoid misunderstandings  Thank you

## 2016-07-24 NOTE — Telephone Encounter (Signed)
Dr. Primus Bravo, I think this is FYI

## 2016-08-12 ENCOUNTER — Ambulatory Visit: Payer: Medicaid Other | Admitting: Pain Medicine

## 2016-08-13 ENCOUNTER — Encounter: Payer: Self-pay | Admitting: Pain Medicine

## 2016-08-13 ENCOUNTER — Telehealth: Payer: Self-pay | Admitting: Pain Medicine

## 2016-08-13 ENCOUNTER — Ambulatory Visit: Payer: Medicaid Other | Admitting: Pain Medicine

## 2016-08-13 VITALS — BP 128/89 | HR 81 | Temp 98.2°F | Resp 15 | Ht 66.5 in | Wt 154.0 lb

## 2016-08-13 NOTE — Patient Instructions (Addendum)
PLAN   Continue present medications continue Flexeril and tramadol. Caution tramadol and Flexeril ( cyclobenzaprine )can cause respiratory depression, excessive sedation, confusion, and other side effects  .Exercise extreme caution when taking these medications to avoid undesirable side effects as discussed  F/U PCP Dr. Sabino Snipes  for evaliation of  BP and general medical  condition  Rheumatological evaluation as discussed  Psych follow-up evaluation as scheduled  Vascular follow-up evaluation as needed  Ask the nurses and secretary the date of orthopedic evaluation of left shoulder. MRI reveals tendinosis and bursitis of the left shoulder  F/U surgical evaluation. May consider pending follow-up evaluations  F/U neurological evaluation. May consider pending follow-up evaluations  May consider radiofrequency rhizolysis or intraspinal procedures pending response to present treatment and F/U evaluation   Patient  Is to [facility] should patient have concerns prior to scheduled return appointment.

## 2016-08-13 NOTE — Progress Notes (Unsigned)
Safety precautions to be maintained throughout the outpatient stay will include: orient to surroundings, keep bed in low position, maintain call bell within reach at all times, provide assistance with transfer out of bed and ambulation.  

## 2016-08-13 NOTE — Telephone Encounter (Signed)
Patient advised per voicemail that she must come for appointment in order to receive any medications.

## 2016-08-13 NOTE — Telephone Encounter (Signed)
Patient was in a room, said she was not feeling well and left without telling anyone, she called and wants to know can you call her in some meds for pain.

## 2016-08-14 ENCOUNTER — Emergency Department
Admission: EM | Admit: 2016-08-14 | Discharge: 2016-08-15 | Disposition: A | Discharge status: Other Acute Inpatient Hospital | Payer: Medicaid Other | Attending: Student in an Organized Health Care Education/Training Program | Admitting: Student in an Organized Health Care Education/Training Program

## 2016-08-14 ENCOUNTER — Encounter: Payer: Self-pay | Admitting: Emergency Medicine

## 2016-08-14 ENCOUNTER — Telehealth: Payer: Self-pay

## 2016-08-14 DIAGNOSIS — Z85850 Personal history of malignant neoplasm of thyroid: Secondary | ICD-10-CM | POA: Diagnosis not present

## 2016-08-14 DIAGNOSIS — R109 Unspecified abdominal pain: Secondary | ICD-10-CM | POA: Insufficient documentation

## 2016-08-14 DIAGNOSIS — J45909 Unspecified asthma, uncomplicated: Secondary | ICD-10-CM | POA: Insufficient documentation

## 2016-08-14 DIAGNOSIS — F419 Anxiety disorder, unspecified: Secondary | ICD-10-CM

## 2016-08-14 DIAGNOSIS — E039 Hypothyroidism, unspecified: Secondary | ICD-10-CM | POA: Insufficient documentation

## 2016-08-14 DIAGNOSIS — F4310 Post-traumatic stress disorder, unspecified: Secondary | ICD-10-CM

## 2016-08-14 DIAGNOSIS — Z79899 Other long term (current) drug therapy: Secondary | ICD-10-CM | POA: Diagnosis not present

## 2016-08-14 DIAGNOSIS — M797 Fibromyalgia: Secondary | ICD-10-CM | POA: Diagnosis present

## 2016-08-14 DIAGNOSIS — C73 Malignant neoplasm of thyroid gland: Secondary | ICD-10-CM | POA: Diagnosis present

## 2016-08-14 LAB — CBC
HCT: 36.8 % (ref 35.0–47.0)
Hemoglobin: 12.6 g/dL (ref 12.0–16.0)
MCH: 29.3 pg (ref 26.0–34.0)
MCHC: 34.1 g/dL (ref 32.0–36.0)
MCV: 86 fL (ref 80.0–100.0)
PLATELETS: 251 10*3/uL (ref 150–440)
RBC: 4.29 MIL/uL (ref 3.80–5.20)
RDW: 14.1 % (ref 11.5–14.5)
WBC: 6.3 10*3/uL (ref 3.6–11.0)

## 2016-08-14 LAB — COMPREHENSIVE METABOLIC PANEL
ALT: 18 U/L (ref 14–54)
AST: 23 U/L (ref 15–41)
Albumin: 4.3 g/dL (ref 3.5–5.0)
Alkaline Phosphatase: 47 U/L (ref 38–126)
Anion gap: 6 (ref 5–15)
BILIRUBIN TOTAL: 0.3 mg/dL (ref 0.3–1.2)
BUN: 8 mg/dL (ref 6–20)
CHLORIDE: 107 mmol/L (ref 101–111)
CO2: 25 mmol/L (ref 22–32)
CREATININE: 0.91 mg/dL (ref 0.44–1.00)
Calcium: 9 mg/dL (ref 8.9–10.3)
Glucose, Bld: 94 mg/dL (ref 65–99)
Potassium: 4.1 mmol/L (ref 3.5–5.1)
Sodium: 138 mmol/L (ref 135–145)
TOTAL PROTEIN: 7.4 g/dL (ref 6.5–8.1)

## 2016-08-14 LAB — URINALYSIS COMPLETE WITH MICROSCOPIC (ARMC ONLY)
BILIRUBIN URINE: NEGATIVE
Bacteria, UA: NONE SEEN
GLUCOSE, UA: NEGATIVE mg/dL
HGB URINE DIPSTICK: NEGATIVE
KETONES UR: NEGATIVE mg/dL
LEUKOCYTES UA: NEGATIVE
NITRITE: NEGATIVE
PH: 5 (ref 5.0–8.0)
Protein, ur: NEGATIVE mg/dL
SPECIFIC GRAVITY, URINE: 1.011 (ref 1.005–1.030)

## 2016-08-14 LAB — URINE DRUG SCREEN, QUALITATIVE (ARMC ONLY)
Amphetamines, Ur Screen: NOT DETECTED
BARBITURATES, UR SCREEN: NOT DETECTED
Benzodiazepine, Ur Scrn: NOT DETECTED
CANNABINOID 50 NG, UR ~~LOC~~: NOT DETECTED
COCAINE METABOLITE, UR ~~LOC~~: NOT DETECTED
MDMA (ECSTASY) UR SCREEN: NOT DETECTED
Methadone Scn, Ur: NOT DETECTED
OPIATE, UR SCREEN: NOT DETECTED
Phencyclidine (PCP) Ur S: NOT DETECTED
TRICYCLIC, UR SCREEN: POSITIVE — AB

## 2016-08-14 LAB — PREGNANCY, URINE: Preg Test, Ur: NEGATIVE

## 2016-08-14 LAB — LIPASE, BLOOD: LIPASE: 23 U/L (ref 11–51)

## 2016-08-14 MED ORDER — BUDESONIDE 0.25 MG/2ML IN SUSP
0.2500 mg | Freq: Two times a day (BID) | RESPIRATORY_TRACT | Status: DC
  Administered 2016-08-14 – 2016-08-15 (×2): 0.25 mg via RESPIRATORY_TRACT
  Filled 2016-08-14 (×5): qty 2

## 2016-08-14 MED ORDER — B COMPLEX PO TABS: 1.0000 | ORAL_TABLET | Freq: Every day | ORAL | Status: DC

## 2016-08-14 MED ORDER — FLUOXETINE HCL 10 MG PO CAPS
10.0000 mg | ORAL_CAPSULE | Freq: Every day | ORAL | Status: DC
  Administered 2016-08-14 – 2016-08-15 (×2): 10 mg via ORAL
  Filled 2016-08-14 (×3): qty 1

## 2016-08-14 MED ORDER — HYDROXYZINE HCL 25 MG PO TABS: 25.0000 mg | ORAL_TABLET | Freq: Four times a day (QID) | ORAL | Status: DC | PRN

## 2016-08-14 MED ORDER — AMITRIPTYLINE HCL 10 MG PO TABS: 10.0000 mg | ORAL_TABLET | Freq: Every day | ORAL | Status: DC

## 2016-08-14 MED ORDER — QUETIAPINE FUMARATE 200 MG PO TABS
100.0000 mg | ORAL_TABLET | Freq: Every day | ORAL | Status: DC
  Administered 2016-08-14: 100 mg via ORAL

## 2016-08-14 MED ORDER — LORAZEPAM 1 MG PO TABS
1.0000 mg | ORAL_TABLET | Freq: Once | ORAL | Status: AC
  Administered 2016-08-14: 1 mg via ORAL
  Filled 2016-08-14: qty 1

## 2016-08-14 MED ORDER — TRAMADOL HCL 50 MG PO TABS
50.0000 mg | ORAL_TABLET | Freq: Two times a day (BID) | ORAL | Status: DC | PRN
  Administered 2016-08-15: 50 mg via ORAL
  Filled 2016-08-14: qty 1

## 2016-08-14 MED ORDER — METOPROLOL SUCCINATE ER 50 MG PO TB24
50.0000 mg | ORAL_TABLET | Freq: Every day | ORAL | Status: DC
  Administered 2016-08-15: 50 mg via ORAL
  Filled 2016-08-14: qty 1

## 2016-08-14 MED ORDER — ALBUTEROL SULFATE HFA 108 (90 BASE) MCG/ACT IN AERS
1.0000 | INHALATION_SPRAY | Freq: Four times a day (QID) | RESPIRATORY_TRACT | Status: DC | PRN
  Filled 2016-08-14: qty 6.7

## 2016-08-14 MED ORDER — BECLOMETHASONE DIPROPIONATE 80 MCG/ACT IN AERS: 2.0000 | INHALATION_SPRAY | Freq: Two times a day (BID) | RESPIRATORY_TRACT | Status: DC

## 2016-08-14 MED ORDER — FLUOXETINE HCL 20 MG PO CAPS
ORAL_CAPSULE | ORAL | Status: AC
  Filled 2016-08-14: qty 1

## 2016-08-14 MED ORDER — LEVOTHYROXINE SODIUM 75 MCG PO TABS
75.0000 ug | ORAL_TABLET | Freq: Every day | ORAL | Status: DC
  Administered 2016-08-15: 75 ug via ORAL
  Filled 2016-08-14: qty 1

## 2016-08-14 MED ORDER — B COMPLEX-C PO TABS
1.0000 | ORAL_TABLET | Freq: Every day | ORAL | Status: DC
  Administered 2016-08-15: 1 via ORAL
  Filled 2016-08-14 (×2): qty 1

## 2016-08-14 MED ORDER — QUETIAPINE FUMARATE 25 MG PO TABS
ORAL_TABLET | ORAL | Status: AC
  Filled 2016-08-14: qty 4

## 2016-08-14 MED ORDER — AMITRIPTYLINE HCL 25 MG PO TABS
25.0000 mg | ORAL_TABLET | Freq: Every day | ORAL | Status: DC
  Administered 2016-08-14: 25 mg via ORAL
  Filled 2016-08-14 (×2): qty 1

## 2016-08-14 NOTE — ED Notes (Signed)
EDP at bedside  

## 2016-08-14 NOTE — ED Triage Notes (Addendum)
Pt states "I had a little bit of a crisis situation today". Pt denies SI/HI at this time. Pt states she spoke with a Education officer, museum because she had not been feeling well and was referred to the ER by the Education officer, museum. Pt states that she feels anxious at this time. Pt denies taking anything at this time, VSS and WNL. Pt repeatedly states, "my nerves is up just a little bit". Pt also states that she has had some lower abdominal cramping x 2 days. Pt states diarrhea and a little bit of nausea. NAD noted at this time.

## 2016-08-14 NOTE — ED Notes (Signed)
TTS at bedside. 
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
## 2016-08-14 NOTE — ED Notes (Signed)
Dr.[name] at bedside  
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
## 2016-08-14 NOTE — Telephone Encounter (Signed)
Patient left vm. Returned patients call, transferred her to Coralyn Helling who was working on call backs.

## 2016-08-14 NOTE — ED Notes (Signed)
Moved to [place]

## 2016-08-14 NOTE — Progress Notes (Signed)
Patient left pain management AGAINST MEDICAL ADVICE. MRI was reviewed and patient was scheduled for orthopedic evaluation of the shoulder.. There was no charge for visit and patient was removed from the scheduled.
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
## 2016-08-14 NOTE — ED Notes (Signed)
Report was received from Arnaldo Natal., RN; Pt. Verbalizes no complaints or distress; denies S.I./Hi.; verbalizes having Depression; Continue to monitor with 15 min. Monitoring.

## 2016-08-15 ENCOUNTER — Encounter: Payer: Self-pay | Admitting: Psychiatry

## 2016-08-15 ENCOUNTER — Inpatient Hospital Stay
Admission: AD | Admit: 2016-08-15 | Discharge: 2016-08-22 | DRG: 885 | Disposition: A | Discharge status: Home / Self Care | Payer: Medicaid Other | Source: Intra-hospital | Attending: Psychiatry | Admitting: Psychiatry

## 2016-08-15 DIAGNOSIS — M797 Fibromyalgia: Secondary | ICD-10-CM | POA: Diagnosis present

## 2016-08-15 DIAGNOSIS — Z825 Family history of asthma and other chronic lower respiratory diseases: Secondary | ICD-10-CM | POA: Diagnosis not present

## 2016-08-15 DIAGNOSIS — K5900 Constipation, unspecified: Secondary | ICD-10-CM | POA: Diagnosis present

## 2016-08-15 DIAGNOSIS — Z809 Family history of malignant neoplasm, unspecified: Secondary | ICD-10-CM | POA: Diagnosis not present

## 2016-08-15 DIAGNOSIS — J449 Chronic obstructive pulmonary disease, unspecified: Secondary | ICD-10-CM | POA: Diagnosis present

## 2016-08-15 DIAGNOSIS — Z8261 Family history of arthritis: Secondary | ICD-10-CM

## 2016-08-15 DIAGNOSIS — Z9049 Acquired absence of other specified parts of digestive tract: Secondary | ICD-10-CM

## 2016-08-15 DIAGNOSIS — I10 Essential (primary) hypertension: Secondary | ICD-10-CM | POA: Diagnosis present

## 2016-08-15 DIAGNOSIS — Z886 Allergy status to analgesic agent status: Secondary | ICD-10-CM | POA: Diagnosis not present

## 2016-08-15 DIAGNOSIS — Z818 Family history of other mental and behavioral disorders: Secondary | ICD-10-CM

## 2016-08-15 DIAGNOSIS — K219 Gastro-esophageal reflux disease without esophagitis: Secondary | ICD-10-CM | POA: Diagnosis present

## 2016-08-15 DIAGNOSIS — Z91040 Latex allergy status: Secondary | ICD-10-CM

## 2016-08-15 DIAGNOSIS — Z923 Personal history of irradiation: Secondary | ICD-10-CM

## 2016-08-15 DIAGNOSIS — Z79899 Other long term (current) drug therapy: Secondary | ICD-10-CM

## 2016-08-15 DIAGNOSIS — Z8249 Family history of ischemic heart disease and other diseases of the circulatory system: Secondary | ICD-10-CM

## 2016-08-15 DIAGNOSIS — Z91410 Personal history of adult physical and sexual abuse: Secondary | ICD-10-CM | POA: Diagnosis not present

## 2016-08-15 DIAGNOSIS — Z9889 Other specified postprocedural states: Secondary | ICD-10-CM

## 2016-08-15 DIAGNOSIS — F333 Major depressive disorder, recurrent, severe with psychotic symptoms: Secondary | ICD-10-CM | POA: Diagnosis present

## 2016-08-15 DIAGNOSIS — C73 Malignant neoplasm of thyroid gland: Secondary | ICD-10-CM | POA: Diagnosis present

## 2016-08-15 DIAGNOSIS — Z823 Family history of stroke: Secondary | ICD-10-CM

## 2016-08-15 DIAGNOSIS — G8929 Other chronic pain: Secondary | ICD-10-CM | POA: Diagnosis present

## 2016-08-15 DIAGNOSIS — F4310 Post-traumatic stress disorder, unspecified: Secondary | ICD-10-CM | POA: Diagnosis present

## 2016-08-15 DIAGNOSIS — E039 Hypothyroidism, unspecified: Secondary | ICD-10-CM | POA: Diagnosis present

## 2016-08-15 DIAGNOSIS — F323 Major depressive disorder, single episode, severe with psychotic features: Principal | ICD-10-CM | POA: Diagnosis present

## 2016-08-15 DIAGNOSIS — Z85850 Personal history of malignant neoplasm of thyroid: Secondary | ICD-10-CM | POA: Diagnosis not present

## 2016-08-15 DIAGNOSIS — Z8614 Personal history of Methicillin resistant Staphylococcus aureus infection: Secondary | ICD-10-CM

## 2016-08-15 MED ORDER — LIOTHYRONINE SODIUM 5 MCG PO TABS
5.0000 ug | ORAL_TABLET | Freq: Every day | ORAL | Status: DC
  Administered 2016-08-15: 5 ug via ORAL
  Filled 2016-08-15: qty 1

## 2016-08-15 MED ORDER — PANTOPRAZOLE SODIUM 40 MG PO TBEC
40.0000 mg | DELAYED_RELEASE_TABLET | Freq: Every day | ORAL | Status: DC
  Administered 2016-08-15 – 2016-08-22 (×8): 40 mg via ORAL
  Filled 2016-08-15 (×8): qty 1

## 2016-08-15 MED ORDER — MAGNESIUM HYDROXIDE 400 MG/5ML PO SUSP
30.0000 mL | Freq: Every day | ORAL | Status: DC | PRN
  Administered 2016-08-17 – 2016-08-19 (×3): 30 mL via ORAL
  Filled 2016-08-15 (×2): qty 30

## 2016-08-15 MED ORDER — LIOTHYRONINE SODIUM 5 MCG PO TABS
5.0000 ug | ORAL_TABLET | Freq: Every day | ORAL | Status: DC
  Administered 2016-08-16 – 2016-08-22 (×7): 5 ug via ORAL
  Filled 2016-08-15 (×10): qty 1

## 2016-08-15 MED ORDER — ALBUTEROL SULFATE HFA 108 (90 BASE) MCG/ACT IN AERS
2.0000 | INHALATION_SPRAY | Freq: Four times a day (QID) | RESPIRATORY_TRACT | Status: DC | PRN
  Administered 2016-08-16 – 2016-08-21 (×5): 2 via RESPIRATORY_TRACT
  Filled 2016-08-15: qty 6.7

## 2016-08-15 MED ORDER — RISPERIDONE 1 MG PO TABS
2.0000 mg | ORAL_TABLET | Freq: Every day | ORAL | Status: DC
  Filled 2016-08-15 (×2): qty 2

## 2016-08-15 MED ORDER — ACETAMINOPHEN 325 MG PO TABS
650.0000 mg | ORAL_TABLET | Freq: Four times a day (QID) | ORAL | Status: DC | PRN
  Administered 2016-08-15: 650 mg via ORAL
  Filled 2016-08-15: qty 2

## 2016-08-15 MED ORDER — HYDROXYZINE HCL 25 MG PO TABS
25.0000 mg | ORAL_TABLET | Freq: Three times a day (TID) | ORAL | Status: DC | PRN
  Administered 2016-08-15 – 2016-08-21 (×9): 25 mg via ORAL
  Filled 2016-08-15 (×10): qty 1

## 2016-08-15 MED ORDER — METOPROLOL SUCCINATE ER 25 MG PO TB24
50.0000 mg | ORAL_TABLET | Freq: Every day | ORAL | Status: DC
  Administered 2016-08-16 – 2016-08-22 (×7): 50 mg via ORAL
  Filled 2016-08-15 (×8): qty 2

## 2016-08-15 MED ORDER — FENTANYL 12 MCG/HR TD PT72
12.5000 ug | MEDICATED_PATCH | TRANSDERMAL | Status: DC
  Administered 2016-08-15 – 2016-08-21 (×3): 12.5 ug via TRANSDERMAL
  Filled 2016-08-15 (×3): qty 1

## 2016-08-15 MED ORDER — PRAZOSIN HCL 2 MG PO CAPS: 2.0000 mg | ORAL_CAPSULE | Freq: Two times a day (BID) | ORAL | Status: DC

## 2016-08-15 MED ORDER — ALUM & MAG HYDROXIDE-SIMETH 200-200-20 MG/5ML PO SUSP
30.0000 mL | ORAL | Status: DC | PRN
  Filled 2016-08-15: qty 30

## 2016-08-15 MED ORDER — AMITRIPTYLINE HCL 50 MG PO TABS
50.0000 mg | ORAL_TABLET | Freq: Every day | ORAL | Status: DC
  Administered 2016-08-15 – 2016-08-19 (×5): 50 mg via ORAL
  Filled 2016-08-15 (×6): qty 1

## 2016-08-15 MED ORDER — ACETAMINOPHEN 325 MG PO TABS
650.0000 mg | ORAL_TABLET | Freq: Four times a day (QID) | ORAL | Status: DC | PRN
  Administered 2016-08-16 – 2016-08-21 (×3): 650 mg via ORAL
  Filled 2016-08-15 (×4): qty 2

## 2016-08-15 MED ORDER — LEVOTHYROXINE SODIUM 75 MCG PO TABS
75.0000 ug | ORAL_TABLET | Freq: Every day | ORAL | Status: DC
  Administered 2016-08-16 – 2016-08-22 (×7): 75 ug via ORAL
  Filled 2016-08-15 (×9): qty 1

## 2016-08-15 MED ORDER — CYCLOBENZAPRINE HCL 10 MG PO TABS
5.0000 mg | ORAL_TABLET | Freq: Three times a day (TID) | ORAL | Status: DC
  Administered 2016-08-15 – 2016-08-22 (×19): 5 mg via ORAL
  Filled 2016-08-15 (×19): qty 1

## 2016-08-15 NOTE — Tx Team (Signed)
Initial Treatment Plan 08/15/2016 4:49 PM Vernard Gambles [id number]    PATIENT STRESSORS: Health problems Traumatic event   PATIENT STRENGTHS: Ability for insight Motivation for treatment/growth   PATIENT IDENTIFIED PROBLEMS: Anxiety  Depression  Chronic Pain                 DISCHARGE CRITERIA:  Improved stabilization in mood, thinking, and/or behavior Verbal commitment to aftercare and medication compliance  PRELIMINARY DISCHARGE PLAN: Attend aftercare/continuing care group Outpatient therapy  PATIENT/FAMILY INVOLVEMENT: This treatment plan has been presented to and reviewed with the patient, VERNELLA STUDENT.  The patient and family have been given the opportunity to ask questions and make suggestions.  Micheline Maze, RN 08/15/2016, 4:49 PM

## 2016-08-15 NOTE — ED Notes (Addendum)
Pt denied SI/HI/AVH. She says she has PTSD and social anxiety disorder, for which six months of therapy had not helped. She is calm and cooperative. Pt given specimen cup and asked to provide urine sample. Pt verbalized understanding and indicated she will provide specimen shortly. Breakfast tray provided.

## 2016-08-15 NOTE — ED Notes (Signed)
Called BMU to give report. RN will call back to get report upon return from lunch.

## 2016-08-15 NOTE — Progress Notes (Signed)
Patient was discharged per order for transfer to BMU. Pt was escorted to BMU, where belongings and paperwork were given to [facility] staff member. Pt verbalized readiness for transfer and signed for same.

## 2016-08-15 NOTE — Progress Notes (Signed)
Spoke with pharmacy to confirm pt's home meds are with pharmacy.

## 2016-08-15 NOTE — ED Notes (Signed)

## 2016-08-15 NOTE — ED Notes (Addendum)
Lunch tray provided. 

## 2016-08-15 NOTE — Plan of Care (Signed)
Problem: Self-Concept: Goal: Level of anxiety will decrease Outcome: Not Progressing Patient observed to be anxious. Patient encouraged to deep breathe.

## 2016-08-15 NOTE — Consult Note (Signed)
Order received for patient request for prayer. Chaplain visited with patient, who reports that she has had to 'empty herself, her emotions to doctors and others all afternoon' and she is tired.  She asks for prayer for being able to organize her thoughts, find help and peace; she is comfortable with this occuring outside the dining/gathering room, which chaplain provides in visit.  She also requested follow up over the weekend, if she checks with nurse, because she finds much comfort in knowing that God is present with her.  Chaplain affirms that she can request prayer and chaplain on call can assist her.

## 2016-08-15 NOTE — [facility] Group Notes (Signed)
[place] Group Notes:  (Nursing/MHT/Case Management/Adjunct)  Date:  08/15/2016  Time:  5:01 PM  Type of Therapy:  Psychoeducational Skills  Participation Level:  Did Not Attend  Charise Killian 08/15/2016, 5:01 PM
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
## 2016-08-15 NOTE — ED Provider Notes (Signed)
-----------------------------------------   7:19 AM on 08/15/2016 -----------------------------------------   Blood pressure 108/61, pulse 89, temperature 97.7 F (36.5 C), temperature source Oral, resp. rate 18, height 5\' 6"  (1.676 m), weight 150 lb (68 kg), last menstrual period 07/13/2016, SpO2 100 %.  The patient had no acute events since last update.  Calm and cooperative at this time.  Disposition is pending Psychiatry/Behavioral Medicine team recommendations.     Paulette Blanch, MD 08/15/16 [phone]

## 2016-08-15 NOTE — BHH Suicide Risk Assessment (Signed)
[facility] Admission Suicide Risk Assessment   Nursing information obtained from:    Demographic factors:    Current Mental Status:    Loss Factors:    Historical Factors:    Risk Reduction Factors:     Total Time spent with patient: 1 hour Principal Problem: Major depressive disorder, single episode, severe without psychotic features ([place]) Diagnosis:   Patient Active Problem List   Diagnosis Date Noted  . Major depressive disorder, single episode, severe without psychotic features ([place]) [F32.2] 08/15/2016  . 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 DDD (degenerative disc disease), lumbar [M51.36] 03/18/2016  . 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 Lumbar radiculopathy [M54.16] 03/18/2016  . 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 Cervicalgia [M54.2] 01/31/2016  . Sacroiliac joint dysfunction [M53.3] 01/31/2016  . Myofascial pain [M79.1] 01/31/2016  . Fibromyalgia [M79.7] 01/31/2016  . Bilateral occipital neuralgia [M54.81] 01/31/2016  . Hyperlipidemia [E78.5] 07/01/2012  . Thyroid cancer ([place]) [C73]   . Post traumatic stress disorder (PTSD) [F43.10]   . Gastroesophageal reflux disease [K21.9]   . Endometriosis [N80.9]    Subjective Data: depression, anxiety, paranoia.  Continued Clinical Symptoms:    The "Alcohol Use Disorders Identification Test", Guidelines for Use in Primary Care, Second Edition.  World Pharmacologist [facility]). Score between 0-7:  no or low risk or alcohol related problems. Score between 8-15:  moderate risk of alcohol related problems. Score between 16-19:  high risk of alcohol related problems. Score 20 or above:  warrants further diagnostic evaluation for alcohol dependence and treatment.   CLINICAL FACTORS:   Severe Anxiety and/or Agitation Depression:   Impulsivity Chronic Pain Currently Psychotic   Musculoskeletal: Strength & Muscle Tone: within normal limits Gait & Station: normal Patient leans: N/A  Psychiatric Specialty  Exam: Physical Exam  Nursing note and vitals reviewed.   Review of Systems  Musculoskeletal: Positive for joint pain and myalgias.  Psychiatric/Behavioral: Positive for depression. The patient is nervous/anxious.     Last menstrual period 07/13/2016.There is no height or weight on file to calculate BMI.  General Appearance: Casual  Eye Contact:  Fair  Speech:  Slow  Volume:  Decreased  Mood:  Anxious and Depressed  Affect:  Congruent  Thought Process:  Goal Directed  Orientation:  Full (Time, Place, and Person)  Thought Content:  Delusions and Paranoid Ideation  Suicidal Thoughts:  No  Homicidal Thoughts:  No  Memory:  Immediate;   Fair Recent;   Fair Remote;   Fair  Judgement:  Impaired  Insight:  Shallow  Psychomotor Activity:  Psychomotor Retardation  Concentration:  Concentration: Fair and Attention Span: Fair  Recall:  [employer] of Knowledge:  Fair  Language:  Fair  Akathisia:  No  Handed:  Right  AIMS (if indicated):     Assets:  Communication Skills Desire for Improvement Financial Resources/Insurance Housing Resilience Social Support  ADL's:  Intact  Cognition:  WNL  Sleep:         COGNITIVE FEATURES THAT CONTRIBUTE TO RISK:  None    SUICIDE RISK:   Mild:  Suicidal ideation of limited frequency, intensity, duration, and specificity.  There are no identifiable plans, no associated intent, mild dysphoria and related symptoms, good self-control (both objective and subjective assessment), few other risk factors, and identifiable protective factors, including available and accessible social support.   PLAN OF CARE: Hospital admission, medication management, discharge planning.  Ms. Kennis is a 35 year old female with a history of PTSD and chronic pain admitted for  paranoid ideation and worsening of depression.   1. Mood and psychosis. We will start 2 mg Risperdal tonight for psychosis and 50 mg of Elavil for depression.  2. History of thyroid cancer.  We'll continue Cytomel and Synthroid.  3. COPD. She is on albuterol.  4. Chronic pain. She is on Flexeril.  5. Hypertension. She is on Toprol.  6. GERD. She is on Protonix.  7. Metabolic syndrome monitoring. Lipid profile, hemoglobin A1c and TSH are pending.   8. Disposition. She will be discharged to home. She will follow up with [facility].  I certify that inpatient services furnished can reasonably be expected to improve the patient's condition.    Orson Slick, MD 08/15/2016, 4:00 PM

## 2016-08-16 LAB — LIPID PANEL
CHOLESTEROL: 173 mg/dL (ref 0–200)
HDL: 54 mg/dL (ref >40)
LDL Cholesterol: 102 mg/dL — ABNORMAL HIGH (ref 0–99)
Total CHOL/HDL Ratio: 3.2 RATIO
Triglycerides: 83 mg/dL (ref <150)
VLDL: 17 mg/dL (ref 0–40)

## 2016-08-16 LAB — HEMOGLOBIN A1C: HEMOGLOBIN A1C: 5.4 % (ref 4.0–6.0)

## 2016-08-16 LAB — TSH: TSH: 1.254 u[IU]/mL (ref 0.350–4.500)

## 2016-08-16 NOTE — Plan of Care (Signed)
Problem: Safety: Goal: Ability to remain free from injury will improve Outcome: Progressing Patient has remained free from danger during this shift.

## 2016-08-16 NOTE — BHH Group Notes (Signed)
[facility] Group Notes:  (Nursing/MHT/Case Management/Adjunct)  Date:  08/16/2016  Time:  1:26 AM  Type of Therapy:  Psychoeducational Skills  Participation Level:  Did Not Attend  Participation Quality: Summary of Progress/Problems:  Kristina Li 08/16/2016, 1:26 AM

## 2016-08-16 NOTE — Progress Notes (Signed)
D: Patient appears flat and depressed. She is reluctant to talk about why she is here. She denies SI/HI/AVH. Patient's mother has called multiple times wanting information. Patient states she only wants to give out code and does not want her to have any other information. She has secluded to her room and does not interact with peers.  A: Medication given with education. Encouragement provided.  R: Patient was compliant with all medications except for Risperdal. She states it gives her an allergic reaction. She has remained calm and cooperative. Safety maintained with 15 min checks.
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
## 2016-08-16 NOTE — BHH Group Notes (Signed)
[place] LCSW Group Therapy  08/16/2016 2:05 PM  Type of Therapy:  Group Therapy  Participation Level:  Active  Participation Quality:  Appropriate and Sharing  Affect:  Appropriate  Cognitive:  Appropriate  Insight:  Improving  Engagement in Therapy:  Engaged  Modes of Intervention:  Activity, Discussion and Support  Summary of Progress/Problems: Self esteem: Patients discussed self esteem and how it impacts them. They discussed what aspects in their lives has influenced their self esteem. They were challenged to identify changes that are needed in order to improve self esteem. Patients participated in activity where they had to identify positive adjectives they felt described their personality. Patients shared with the group on the following areas: Things I am good at, What I like about my appearance, I've helped others by, What I value the most, compliments I have received, challenges I have overcome, thing that make me unique, and Times I've made others happy. Pt stated that she feels support groups could improve her self-esteem. Pt stated she tends to be irritable when dealing with others due to her significant physical pain.    Kristina Li, [place] 08/16/2016, 2:05 PM

## 2016-08-16 NOTE — Progress Notes (Addendum)
Pt has been pleasant and cooperative. Pt' s mood and affect has been depressed. Pt needs encouragement to be active on the unit. Pt denies SI and A/V hallucinations.
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
## 2016-08-17 MED ORDER — GABAPENTIN 100 MG PO CAPS
100.0000 mg | ORAL_CAPSULE | Freq: Three times a day (TID) | ORAL | Status: DC
  Administered 2016-08-17 – 2016-08-18 (×2): 100 mg via ORAL
  Filled 2016-08-17 (×2): qty 1

## 2016-08-17 MED ORDER — QUETIAPINE FUMARATE 25 MG PO TABS
50.0000 mg | ORAL_TABLET | Freq: Every day | ORAL | Status: DC
  Administered 2016-08-19: 50 mg via ORAL
  Filled 2016-08-17 (×3): qty 2

## 2016-08-17 NOTE — Progress Notes (Signed)
Patient slept 7 hrs.
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
## 2016-08-17 NOTE — [facility] Group Notes (Signed)
[facility] LCSW Group Therapy Note  08/17/2016 1:00pm  Type of Therapy and Topic: Group Therapy: Holding onto Grudges   Participation Level: Minimal  Description of Group:  In this group patients will be asked to explore and define a grudge. Patients will be guided to discuss their thoughts, feelings, and behaviors as to why one holds on to grudges and reasons why people have grudges. Patients will process the impact grudges have on daily life and identify thoughts and feelings related to holding on to grudges. Facilitator will challenge patients to identify ways of letting go of grudges and the benefits once released. Patients will be confronted to address why one struggles letting go of grudges. Lastly, patients will identify feelings and thoughts related to what life would look like without grudges and actions steps that patients can take to begin to let go of the grudge. This group will be process-oriented, with patients participating in exploration of their own experiences as well as giving and receiving support and challenge from other group members.    Therapeutic Goals:  1. Patient will identify specific grudges related to their personal life.  2. Patient will identify feelings, thoughts, and beliefs around grudges.  3. Patient will identify how one releases grudges appropriately.  4. Patient will identify situations where they could have let go of the grudge, but instead chose to hold on.    Summary of Patient Progress: Pt arrived late to group and did not participate in group discussion.   Therapeutic Modalities:  Cognitive Behavioral Therapy  Solution Focused Therapy  Motivational Interviewing  Brief Therapy    Peri Maris, [place] 08/17/2016 1:04 PM

## 2016-08-17 NOTE — [facility] Group Notes (Signed)
[facility] Group Notes:  (Nursing/MHT/Case Management/Adjunct)  Date:  08/17/2016  Time:  5:45 AM  Type of Therapy:  Psychoeducational Skills  Participation Level:  Did Not Attend  Summary of Progress/Problems:  Reece Agar 08/17/2016, 5:45 AM

## 2016-08-17 NOTE — Progress Notes (Signed)
Pt has been pleasant and cooperative. Pt noted to be disorganized at times. Pt denies SI and A/V hallucinations. Pt continues to be seclusive to her at times, limited verbal  Interactions noted with peers and staff.

## 2016-08-17 NOTE — Plan of Care (Signed)
Problem: Health Behavior/Discharge Planning: Goal: Compliance with therapeutic regimen will improve Outcome: Not Progressing Pt refused night scheduled Risperdal dose.

## 2016-08-17 NOTE — Progress Notes (Signed)
D: Observed pt in in dayroom. Patient alert and oriented x4. Patient denies SI/HI/AVH. Pt affect is anxious and depressed. Pt rated anxiety 6/10 and depression "I don't know." Pt stated her day was "all right, wasn't feeling that well." Pt thought content can be bizarre at times.  Pt forwarded little with nurse stating "I'm tired, I just got up."  A: Offered active listening and support. Provided therapeutic communication. Administered scheduled medications. Attempted to educate pt on medications. Encouraged pt to attend groups and actively participate in care.  R: Pt cooperative. Pt refused night dose of risperdal stating "It makes me sick." Pt could not elaborate stating " I don't know how, my doctor told me."  Will continue Q15 min. checks. Safety maintained.

## 2016-08-18 MED ORDER — GABAPENTIN 100 MG PO CAPS
200.0000 mg | ORAL_CAPSULE | Freq: Three times a day (TID) | ORAL | Status: DC
  Administered 2016-08-18 – 2016-08-22 (×11): 200 mg via ORAL
  Filled 2016-08-18 (×12): qty 2

## 2016-08-18 NOTE — [facility] Group Notes (Signed)
[place] Group Notes:  (Nursing/MHT/Case Management/Adjunct)  Date:  08/18/2016  Time:  9:25 PM  Type of Therapy:  Group Therapy  Participation Level:  Active  Participation Quality:  Appropriate  Affect:  Appropriate  Cognitive:  Appropriate  Insight:  Appropriate  Engagement in Group:  Engaged  Modes of Intervention:  Education  Summary of Progress/Problems:  Rinaldo Cloud 08/18/2016, 9:25 PM

## 2016-08-18 NOTE — Plan of Care (Signed)
Problem: Self-Concept: Goal: Level of anxiety will decrease Outcome: Not Progressing Pt affect is anxious this evening. She rates anxiety 6/10 and requests PRN medication.  Problem: Safety: Goal: Periods of time without injury will increase Outcome: Progressing Pt remains free from harm.

## 2016-08-18 NOTE — Plan of Care (Signed)
Problem: Coping: Goal: Ability to verbalize feelings will improve Outcome: Progressing Pt open with feelings and verbalizes how she feels and can identify ways to cope. Medication compliant. Socializes in dayroom this afternoon. Pleasant and cooperative. Support and encouragement provided. Will continue to monitor.

## 2016-08-18 NOTE — Progress Notes (Signed)
D: Pt is isolative to her room this evening. She appears anxious on approach and asks many questions about her prescribed medications and their side effects. She rates anxiety 6/10 and states "I've just been down." Denies SI/HI/AVH at this time. Pt c/o constipation and requests PRN medication. PRN medication also given for anxiety. Pt refuses evening dose of seroquel despite encouragement. A: Emotional support and encouragement provided. Medications administered with education. q15 minute safety checks maintained. R: Pt remains free from harm. Will continue to monitor.

## 2016-08-18 NOTE — Progress Notes (Signed)
Recreation Therapy Notes  Date: 09.11.17 Time: 9:30 am Location: Craft Room  Group Topic: Self-expression  Goal Area(s) Addresses:  Patient will identify one color per emotion listed on wheel. Patient will verbalize benefit of using art as a means of self-expression. Patient will verbalize on emotion experienced during session. Patient will be educated on other forms of self-expression.  Behavioral Response: Did not attend  Intervention: Emotion Wheel  Activity: Patients were given an Emotion Wheel worksheet and instructed to pick a color for each emotion listed on the wheel.  Education: LRT educated patients on other forms of self-expression.  Education Outcome: Patient did not attend group.  Clinical Observations/Feedback: Patient did not attend group.  Leonette Monarch, LRT/CTRS 08/18/2016 10:14 AM

## 2016-08-18 NOTE — [facility] Group Notes (Signed)
[place] LCSW Group Therapy   08/18/2016 1pm Type of Therapy: Group Therapy   Participation Level: Active   Participation Quality: Attentive, Sharing and Supportive   Affect: Appropriate   Cognitive: Alert and Oriented   Insight: Developing/Improving and Engaged   Engagement in Therapy: Developing/Improving and Engaged   Modes of Intervention: Clarification, Confrontation, Discussion, Education, Exploration,  Limit-setting, Orientation, Problem-solving, Rapport Building, Art therapist, Socialization and Support   Summary of Progress/Problems: Pt identified obstacles faced currently and processed barriers involved in overcoming these obstacles. Pt identified steps necessary for overcoming these obstacles and explored motivation (internal and external) for facing these difficulties head on. Pt further identified one area of concern in their lives and chose a goal to focus on for today. Pt defined an obstacle as "attempting to get over something." She identified chronic pain and anxiety as obstacles that she faces. She stated in order to overcome her obstacles, she believes that identifying with a strong support system and treatment team would be the most helpful.   Glorious Peach, MSW, LCSWA 08/18/2016 2:26PM

## 2016-08-18 NOTE — Progress Notes (Signed)
D: Denies SI/HI/AVH. Reports she is here to "get over some things." Isolative to room this morning, but comes out this afternoon to attend group and socialize in dayroom. Appears anxious. Reports dizziness after gabapentin increase-educated pt on safety with ambulating. Noted to be up on unit without complications.    A: Support and encouragement provided. Medications administered as ordered. Every 15 minute checks for safety.   R: Medication compliant. Safety maintained. Attended group.   Will continue to monitor.
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
## 2016-08-19 MED ORDER — ONDANSETRON HCL 4 MG PO TABS: 4.0000 mg | ORAL_TABLET | Freq: Three times a day (TID) | ORAL | Status: DC | PRN

## 2016-08-19 MED ORDER — MAGNESIUM CITRATE PO SOLN
1.0000 | Freq: Once | ORAL | Status: AC
  Administered 2016-08-20: 1 via ORAL
  Filled 2016-08-19: qty 296

## 2016-08-19 NOTE — [facility] Group Notes (Signed)
Goals Group  Date/Time: 9:00 AM Type of Therapy and Topic: Group Therapy: Goals Group: SMART Goals  ?  Participation Level: Moderate  ?  Description of Group:  ?  The purpose of a daily goals group is to assist and guide patients in setting recovery/wellness-related goals. The objective is to set goals as they relate to the crisis in which they were admitted. Patients will be using SMART goal modalities to set measurable goals. Characteristics of realistic goals will be discussed and patients will be assisted in setting and processing how one will reach their goal. Facilitator will also assist patients in applying interventions and coping skills learned in psycho-education groups to the SMART goal and process how one will achieve defined goal.  ?  Therapeutic Goals:  ?  -Patients will develop and document one goal related to or their crisis in which brought them into treatment.  -Patients will be guided by LCSW using SMART goal setting modality in how to set a measurable, attainable, realistic and time sensitive goal.  -Patients will process barriers in reaching goal.  -Patients will process interventions in how to overcome and successful in reaching goal.  ?  Patient's Goal: Invited but did not attend. ?  Therapeutic Modalities:  Motivational Interviewing  Cognitive Behavioral Therapy  Crisis Intervention Model  SMART goals setting   Glorious Peach, MSW, LCSW-A 08/19/2016, 9:27AM

## 2016-08-19 NOTE — Progress Notes (Signed)
D: Noted odd behavior through out shift .  Remains to  Have problem  With constipation .   Attending  unit  Programs  Patient stated slept good last night .Stated appetite is good and energy level  Is normal. Stated concentration is good . Stated  Depression Denies suicidal  homicidal ideations  .  No auditory hallucinations  No pain concerns . Appropriate ADL'S. Interacting with peers and staff.  A: Encourage patient participation with unit programming . Instruction  Given on  Medication , verbalize understanding. R: Voice no other concerns. Staff continue to monitor
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
## 2016-08-19 NOTE — Plan of Care (Signed)
Problem: Coping: Goal: Ability to identify and develop effective coping behavior will improve Outcome: Progressing Working on coping skills .    

## 2016-08-19 NOTE — Tx Team (Signed)
Interdisciplinary Treatment and Diagnostic Plan Update  08/19/2016 Time of Session: 10:30am Kristina Li MRN: [id number]  Principal Diagnosis: Major depressive disorder, single episode, severe with psychotic features [facility])  Secondary Diagnoses: Principal Problem:   Major depressive disorder, single episode, severe with psychotic features ([name]) Active Problems:   Thyroid cancer ([name])   Post traumatic stress disorder (PTSD)   Gastroesophageal reflux disease   Fibromyalgia   HTN (hypertension)   COPD (chronic obstructive pulmonary disease) ([name])   Current Medications:  Current Facility-Administered Medications  Medication Dose Route Frequency Provider Last Rate Last Dose  . acetaminophen (TYLENOL) tablet 650 mg  650 mg Oral Q6H PRN Clovis Fredrickson, MD   650 mg at 08/19/16 0642  . albuterol (PROVENTIL HFA;VENTOLIN HFA) 108 (90 Base) MCG/ACT inhaler 2 puff  2 puff Inhalation Q6H PRN Clovis Fredrickson, MD   2 puff at 08/18/16 1724  . alum & mag hydroxide-simeth (MAALOX/MYLANTA) 200-200-20 MG/5ML suspension 30 mL  30 mL Oral Q4H PRN [name] [name], MD      . amitriptyline (ELAVIL) tablet 50 mg  50 mg Oral QHS Clovis Fredrickson, MD   50 mg at 08/18/16 2120  . cyclobenzaprine (FLEXERIL) tablet 5 mg  5 mg Oral TID Clovis Fredrickson, MD   5 mg at 08/19/16 1248  . fentaNYL (DURAGESIC - dosed mcg/hr) 12.5 mcg  12.5 mcg Transdermal Q72H [name] [name], MD   12.5 mcg at 08/18/16 1849  . gabapentin (NEURONTIN) capsule 200 mg  200 mg Oral TID Clovis Fredrickson, MD   200 mg at 08/19/16 1248  . hydrOXYzine (ATARAX/VISTARIL) tablet 25 mg  25 mg Oral TID PRN Clovis Fredrickson, MD   25 mg at 08/18/16 0645  . levothyroxine (SYNTHROID, LEVOTHROID) tablet 75 mcg  75 mcg Oral QAC breakfast Clovis Fredrickson, MD   75 mcg at 08/19/16 0641  . liothyronine (CYTOMEL) tablet 5 mcg  5 mcg Oral Daily Clovis Fredrickson, MD   5 mcg at 08/19/16 0641  . magnesium citrate  solution 1 Bottle  1 Bottle Oral Once [name] [name], MD      . magnesium hydroxide (MILK OF MAGNESIA) suspension 30 mL  30 mL Oral Daily PRN Clovis Fredrickson, MD   30 mL at 08/19/16 0859  . metoprolol succinate (TOPROL-XL) 24 hr tablet 50 mg  50 mg Oral Daily [name] [name], MD   50 mg at 08/19/16 0854  . ondansetron (ZOFRAN) tablet 4 mg  4 mg Oral Q8H PRN [name] [name], MD      . pantoprazole (PROTONIX) EC tablet 40 mg  40 mg Oral Daily [name] [name], MD   40 mg at 08/19/16 0855  . QUEtiapine (SEROQUEL) tablet 50 mg  50 mg Oral QHS [name] [name], MD       PTA Medications: Facility-Administered Medications Prior to Admission  Medication Dose Route Frequency Provider Last Rate Last Dose  . bupivacaine (PF) (MARCAINE) 0.25 % injection 30 mL  30 mL Other Once Mohammed Kindle, MD      . lactated ringers infusion 1,000 mL  1,000 mL Intravenous Continuous Mohammed Kindle, MD      . lidocaine (PF) (XYLOCAINE) 1 % injection 10 mL  10 mL Subcutaneous Once Mohammed Kindle, MD      . orphenadrine (NORFLEX) injection 60 mg  60 mg Intramuscular Once Mohammed Kindle, MD      . triamcinolone acetonide [facility]) injection 40 mg  40 mg Other Once Mohammed Kindle,  MD       Prescriptions Prior to Admission  Medication Sig Dispense Refill Last Dose  . albuterol (PROVENTIL HFA;VENTOLIN HFA) 108 (90 BASE) MCG/ACT inhaler Inhale 1-2 puffs into the lungs every 6 (six) hours as needed for wheezing or shortness of breath. (Patient not taking: Reported on 08/14/2016) 1 Inhaler 0 Not Taking at Unknown time  . amitriptyline (ELAVIL) 25 MG tablet Take 10 mg by mouth at bedtime.    08/14/2016 at am  . Aspirin-Acetaminophen-Caffeine (EXCEDRIN EXTRA STRENGTH PO) Take 1 tablet by mouth daily as needed (headache). Reported on 04/28/2016   08/14/2016 at am  . b complex vitamins tablet Take 1 tablet by mouth daily. Reported on 04/28/2016   Not Taking at Unknown time  . beclomethasone (QVAR) 80 MCG/ACT  inhaler Inhale 2 puffs into the lungs 2 (two) times daily. Reported on 04/16/2016   Not Taking at Unknown time  . cholecalciferol (VITAMIN D) 1000 UNITS tablet Take 1,000 Units by mouth daily. Reported on 03/05/2016   Not Taking at Unknown time  . cyclobenzaprine (FLEXERIL) 5 MG tablet Limit 1 tablet by mouth per day or twice per day if tolerated   (NOTE PILL IS 5 MG) 50 tablet 0 08/14/2016 at am  . diazepam (VALIUM) 5 MG tablet Limit 1 tablet by mouth 1 hour prior to MRI if tolerated then take 1 tablet by mouth at the beginning of the MRI. If tolerated  May take 1 additional pill one hour later if necessary and if tolerated (Patient not taking: Reported on 08/13/2016) 3 tablet 0 Not Taking at Unknown time  . hydrOXYzine (ATARAX/VISTARIL) 25 MG tablet Take 25 mg by mouth every 6 (six) hours as needed. Reported on 04/16/2016   Not Taking at Unknown time  . liothyronine (CYTOMEL) 5 MCG tablet Take 5 mcg by mouth daily.  2 08/14/2016 at am  . metoprolol succinate (TOPROL-XL) 50 MG 24 hr tablet Take 50 mg by mouth daily. Reported on 04/16/2016  5 08/14/2016 at am  . nitroGLYCERIN (NITROSTAT) 0.4 MG SL tablet Place 0.4 mg under the tongue every 5 (five) minutes as needed for chest pain.   prn at prn  . ondansetron (ZOFRAN) 4 MG tablet Take 4 mg by mouth every 8 (eight) hours as needed for nausea or vomiting. Reported on 04/16/2016   prn at prn  . oxyCODONE (OXYCONTIN) 10 mg 12 hr tablet Take 10 mg by mouth every 6 (six) hours.   08/13/2016 at Unknown time  . pantoprazole (PROTONIX) 40 MG tablet Take 40 mg by mouth daily.   Not Taking at Unknown time  . SYNTHROID 75 MCG tablet TAKE 1 TABLET BY MOUTH EVERY DAY 90 tablet 0 08/14/2016 at am  . traMADol (ULTRAM) 50 MG tablet Limit 1/2  -  1  tablet by mouth per day or 2 times  times per day if tolerated   NO HYDROCODONE ACETAMINOPHEN 50 tablet 0 08/14/2016 at am    Treatment Modalities: Medication Management, Group therapy, Case management,  1 to 1 session with clinician,  Psychoeducation, Recreational therapy.   Physician Treatment Plan for Primary Diagnosis: Major depressive disorder, single episode, severe with psychotic features ([place]) Long Term Goal(s): Improvement in symptoms so as ready for discharge   Short Term Goals: Ability to identify changes in lifestyle to reduce recurrence of condition will improve, Ability to verbalize feelings will improve, Ability to identify and develop effective coping behaviors will improve, Ability to maintain clinical measurements within normal limits will improve and Compliance  with prescribed medications will improve  Medication Management: Evaluate patient's response, side effects, and tolerance of medication regimen.  Therapeutic Interventions: 1 to 1 sessions, Unit Group sessions and Medication administration.  Evaluation of Outcomes: Progressing  Physician Treatment Plan for Secondary Diagnosis: Principal Problem:   Major depressive disorder, single episode, severe with psychotic features ([place]) Active Problems:   Thyroid cancer ([place])   Post traumatic stress disorder (PTSD)   Gastroesophageal reflux disease   Fibromyalgia   HTN (hypertension)   COPD (chronic obstructive pulmonary disease) ([place])  Long Term Goal(s): Improvement in symptoms so as ready for discharge  Short Term Goals: Ability to identify changes in lifestyle to reduce recurrence of condition will improve, Ability to verbalize feelings will improve, Ability to identify and develop effective coping behaviors will improve, Ability to maintain clinical measurements within normal limits will improve and Compliance with prescribed medications will improve  Medication Management: Evaluate patient's response, side effects, and tolerance of medication regimen.  Therapeutic Interventions: 1 to 1 sessions, Unit Group sessions and Medication administration.  Evaluation of Outcomes: Progressing   RN Treatment Plan for Primary Diagnosis: Major depressive  disorder, single episode, severe with psychotic features ([place]) Long Term Goal(s): Knowledge of disease and therapeutic regimen to maintain health will improve  Short Term Goals: Ability to remain free from injury will improve, Ability to verbalize frustration and anger appropriately will improve, Ability to demonstrate self-control and Ability to disclose and discuss suicidal ideas  Medication Management: RN will administer medications as ordered by provider, will assess and evaluate patient's response and provide education to patient for prescribed medication. RN will report any adverse and/or side effects to prescribing provider.  Therapeutic Interventions: 1 on 1 counseling sessions, Psychoeducation, Medication administration, Evaluate responses to treatment, Monitor vital signs and CBGs as ordered, Perform/monitor CIWA, COWS, AIMS and Fall Risk screenings as ordered, Perform wound care treatments as ordered.  Evaluation of Outcomes: Progressing   LCSW Treatment Plan for Primary Diagnosis: Major depressive disorder, single episode, severe with psychotic features ([place]) Long Term Goal(s): Safe transition to appropriate next level of care at discharge, Engage patient in therapeutic group addressing interpersonal concerns.  Short Term Goals: Engage patient in aftercare planning with referrals and resources, Identify triggers associated with mental health/substance abuse issues and Increase skills for wellness and recovery  Therapeutic Interventions: Assess for all discharge needs, 1 to 1 time with Social worker, Explore available resources and support systems, Assess for adequacy in community support network, Educate family and significant other(s) on suicide prevention, Complete Psychosocial Assessment, Interpersonal group therapy.  Evaluation of Outcomes: Progressing   Progress in Treatment: Attending groups: Yes. Participating in groups: Yes. Taking medication as prescribed:  Yes. Toleration medication: Yes. Family/Significant other contact made: No, will contact:    Patient understands diagnosis: Yes. Discussing patient identified problems/goals with staff: Yes. Medical problems stabilized or resolved: Yes. Denies suicidal/homicidal ideation: No. Issues/concerns per patient self-inventory: No. Other:    New problem(s) identified: No, Describe:     New Short Term/Long Term Goal(s):  Discharge Plan or Barriers:   Reason for Continuation of Hospitalization: Depression Medication stabilization Other; describe Disorganized thinking  Estimated Length of Stay:3-5 days  Attendees: Patient:Kristina Li 08/19/2016 4:24 PM  Physician: Orson Slick, MD 08/19/2016 4:24 PM  Nursing: Polly Cobia, RN 08/19/2016 4:24 PM  RN Care Manager: 08/19/2016 4:24 PM  Social Worker: Dossie Arbour, LCSW 08/19/2016 4:24 PM  Recreational Therapist: Everitt Amber, LRT 08/19/2016 4:24 PM  Other:  08/19/2016 4:24 PM  Other:  08/19/2016 4:24 PM  Other: 08/19/2016 4:24 PM    Scribe for Treatment Team: August Saucer, LCSW,MSW, LCSW 08/19/2016 4:24 PM

## 2016-08-19 NOTE — [facility] Group Notes (Signed)
[place] LCSW Group Therapy   08/19/2016 1pm  Type of Therapy: Group Therapy   Participation Level: Active   Participation Quality: Attentive, Sharing and Supportive   Affect: Appropriate  Cognitive: Alert and Oriented   Insight: Developing/Improving and Engaged   Engagement in Therapy: Developing/Improving and Engaged   Modes of Intervention: Clarification, Confrontation, Discussion, Education, Exploration,  Limit-setting, Orientation, Problem-solving, Rapport Building, Art therapist, Socialization and Support  Summary of Progress/Problems: The topic for group therapy was feelings about diagnosis. Pt actively participated in group discussion on their past and current diagnosis and how they feel towards this. Pt also identified how society and family members judge them, based on their diagnosis as well as stereotypes and stigmas. Pt identified diagnosis as PTSD and social anxiety. She states feelings around her diagnosis as nervous and suspicious. Pt identified cognitive distortions as "people don't understand me." Pt's action plan/new thought is believing in oneself and explaining and educating love ones about diagnoses.         Glorious Peach, MSW, LCSWA 08/19/2016, 2:12PM

## 2016-08-19 NOTE — [facility] Group Notes (Signed)
[place] LCSW Group Therapy   08/19/2016 9:30am  Type of Therapy: Group Therapy   Participation Level: Pt invited but did not attend session.  Participation Quality: Pt invited but did not attend session.   Glorious Peach, MSW, LCSW-A 08/19/2016, 11:09AM

## 2016-08-19 NOTE — Progress Notes (Signed)
Recreation Therapy Notes  Date: 9.12.17 Time: 1:00 pm Location: Craft Room  Group Topic: Self-expression  Goal Area(s) Addresses:  Patient will be able to identify a color that represents each emotion. Patient will verbalize benefit of using art as a means of self-expression. Patient will verbalize one emotion experienced while participating in activity.  Behavioral Response: Did not attend  Intervention: The Colors Within Me  Activity: Patients were given blank face worksheets and instructed to pick a color for each emotion they were feeling and show on the face how much of that emotion they were feeling.  Education: LRT educated patients on other forms of self-expression.  Education Outcome: Acknowledges education/In group clarification offered/Needs additional education.   Clinical Observations/Feedback: Patient did not attend group.  Leonette Monarch, LRT/CTRS 08/19/2016 3:28 PM

## 2016-08-19 NOTE — Plan of Care (Signed)
Problem: Activity: Goal: Interest or engagement in activities will improve Outcome: Progressing Pt seen in the milieu more this evening. She interacts with staff and peers appropriately.  Problem: Coping: Goal: Ability to cope will improve Outcome: Progressing Pt reports that she prays in order to cope with her anxiety.

## 2016-08-19 NOTE — Progress Notes (Signed)
D: Pt is seen in the milieu interacting appropriately with peers this evening. She reports that she is working on attending more groups and talking to her peers. Pt appears less anxious than previous night. She states that she prays when feeling anxious and feels relief. She rates anxiety 5/10 and depression 0/10. Denies SI/HI/AVH at this time. Pt rates pain 3/10 with movement. Pt refuses evening dose of seroquel.  A: Emotional support and encouragement provided. Medications administered with education. q15 minute safety checks maintained. R: Pt remains free from harm. Will continue to monitor.

## 2016-08-20 MED ORDER — QUETIAPINE FUMARATE 100 MG PO TABS
100.0000 mg | ORAL_TABLET | Freq: Every day | ORAL | Status: DC
  Administered 2016-08-20 – 2016-08-21 (×2): 100 mg via ORAL
  Filled 2016-08-20 (×3): qty 1

## 2016-08-20 MED ORDER — INFLUENZA VAC SPLIT QUAD 0.5 ML IM SUSY
0.5000 mL | PREFILLED_SYRINGE | INTRAMUSCULAR | Status: AC
  Administered 2016-08-21: 0.5 mL via INTRAMUSCULAR
  Filled 2016-08-20: qty 0.5

## 2016-08-20 MED ORDER — DULOXETINE HCL 60 MG PO CPEP
60.0000 mg | ORAL_CAPSULE | Freq: Every day | ORAL | Status: DC
  Administered 2016-08-20: 60 mg via ORAL
  Filled 2016-08-20 (×2): qty 1

## 2016-08-20 NOTE — [facility] Group Notes (Signed)
[facility] LCSW Group Therapy   08/20/2016  9:30 am   Type of Therapy: Group Therapy: Emotional Regulation   Participation Level: Invited but did not attend.  Participation Quality: Invited but did not attend.    Glorious Peach, MSW, Latanya Presser

## 2016-08-20 NOTE — [facility] Group Notes (Signed)
[facility] Group Notes:  (Nursing/MHT/Case Management/Adjunct)  Date:  08/20/2016  Time:  9:16 PM  Type of Therapy:  Evening Wrap-up Group  Participation Level:  Did Not Attend  Participation Quality:  N/A   Affect:  N/A  Cognitive:  N/A  Insight:  None  Engagement in Group:  N/A  Modes of Intervention:  Activity  Summary of Progress/Problems:  Levonne Spiller 08/20/2016, 9:16 PM

## 2016-08-20 NOTE — Progress Notes (Signed)
Patient remains paranoid and grandosity. Tangential. Med compliant. Attends groups. Albuterol inhaler given PRN for SOB. No voiced thoughts of hurting herself. No resp distress noted. No behavior issues. Will continue to monitor for safety and behavior.

## 2016-08-20 NOTE — Plan of Care (Signed)
Problem: Safety: Goal: Ability to remain free from injury will improve Outcome: Progressing Patient remains free from harm. No voiced thoughts of hurting herself. No injuries noted.

## 2016-08-20 NOTE — Progress Notes (Signed)
Patient continue to voice of constipation . Received Mag Citrate this am shift . Patient has difficulty processing information when in conversation with  Staff . Attending unit programing . Appropriate ADL's and personal chores. D: Patient stated slept good last night .Stated appetite is good and energy level  Is normal. Stated concentration is good .  Voice of continue  Depression Denies suicidal  homicidal ideations  .  No auditory hallucinations  No pain concerns . Appropriate ADL'S. Interacting with peers and staff.  A: Encourage patient participation with unit programming . Instruction  Given on  Medication , verbalize understanding. R: Voice no other concerns. Staff continue to monitor

## 2016-08-20 NOTE — Progress Notes (Signed)
Recreation Therapy Notes  Date: 09.13.17 Time: 1:00 pm Location: Craft Room  Group Topic: Self-esteem  Goal Area(s) Addresses:  Patient will be able to identify benefit of self-esteem. Patient will be able to identify ways to increase self-esteem.  Behavioral Response: Attentive, Interactive  Intervention: Self-Portrait  Activity: Patients were instructed to draw their self-portrait of how they were feeling. Patients were instructed to write their name on a piece of paper and a positive trait. Patient then wrote positive traits about peers. Patient were instructed to draw their self-portrait of how they felt after reading the feedback from their peers.  Education: LRT educated patients on ways they can increase their self-esteem.  Education Outcome: Acknowledges education/In group clarification offered   Clinical Observations/Feedback: Patient completed activity by drawing both self-portraits. Patients wrote positive traits about self and peers. Patient contributed to group discussion by stating what was different about her faces, how she felt after reading her peers comments, that it was not difficult to believe the comments, and how she can improve her self-esteem.  Leonette Monarch, LRT/CTRS 08/20/2016 3:23 PM
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
## 2016-08-20 NOTE — Plan of Care (Signed)
Problem: Coping: Goal: Ability to identify and develop effective coping behavior will improve Working on coping skills information sheet given

## 2016-08-20 NOTE — [facility] Group Notes (Signed)
[facility] Group Notes:  (Nursing/MHT/Case Management/Adjunct)  Date:  08/20/2016  Time:  5:38 PM  Type of Therapy:  Psychoeducational Skills  Participation Level:  Did Not Attend   Adela Lank [facility] 08/20/2016, 5:38 PM

## 2016-08-20 NOTE — [facility] Group Notes (Signed)
[facility] Group Notes:  (Nursing/MHT/Case Management/Adjunct)  Date:  08/20/2016  Time:  7:13 AM  Type of Therapy:  Group Therapy  Participation Level:  Active  Participation Quality:  Appropriate  Affect:  Appropriate    Cognitive:  Appropriate      Insight:  Appropriate                  Engagement in Group:  Engaged  Modes of Intervention:  Discussion  Summary of Progress/Problems:  Kandis Fantasia 08/20/2016, 7:13 AM

## 2016-08-21 MED ORDER — AMITRIPTYLINE HCL 50 MG PO TABS
25.0000 mg | ORAL_TABLET | Freq: Every day | ORAL | Status: DC
  Administered 2016-08-21: 25 mg via ORAL
  Filled 2016-08-21: qty 1

## 2016-08-21 MED ORDER — PSEUDOEPHEDRINE HCL ER 120 MG PO TB12
120.0000 mg | ORAL_TABLET | Freq: Two times a day (BID) | ORAL | Status: DC | PRN
  Administered 2016-08-21: 120 mg via ORAL
  Filled 2016-08-21: qty 1

## 2016-08-21 NOTE — Tx Team (Signed)
Interdisciplinary Treatment and Diagnostic Plan Update  08/21/2016 Time of Session: 2:00pm Kristina Li MRN: [id number]  Principal Diagnosis: Major depressive disorder, single episode, severe with psychotic features ([place])  Secondary Diagnoses: Principal Problem:   Major depressive disorder, single episode, severe with psychotic features ([place]) Active Problems:   Thyroid cancer ([place])   Post traumatic stress disorder (PTSD)   Gastroesophageal reflux disease   Fibromyalgia   HTN (hypertension)   COPD (chronic obstructive pulmonary disease) ([place])   Current Medications:  Current Facility-Administered Medications  Medication Dose Route Frequency Provider Last Rate Last Dose  . acetaminophen (TYLENOL) tablet 650 mg  650 mg Oral Q6H PRN Clovis Fredrickson, MD   650 mg at 08/19/16 0642  . albuterol (PROVENTIL HFA;VENTOLIN HFA) 108 (90 Base) MCG/ACT inhaler 2 puff  2 puff Inhalation Q6H PRN Clovis Fredrickson, MD   2 puff at 08/21/16 1143  . alum & mag hydroxide-simeth (MAALOX/MYLANTA) 200-200-20 MG/5ML suspension 30 mL  30 mL Oral Q4H PRN [name] [name], MD      . amitriptyline (ELAVIL) tablet 25 mg  25 mg Oral QHS Hildred Priest, MD      . cyclobenzaprine (FLEXERIL) tablet 5 mg  5 mg Oral TID Clovis Fredrickson, MD   5 mg at 08/21/16 1141  . fentaNYL (DURAGESIC - dosed mcg/hr) 12.5 mcg  12.5 mcg Transdermal Q72H [name] [name], MD   12.5 mcg at 08/18/16 1849  . gabapentin (NEURONTIN) capsule 200 mg  200 mg Oral TID Clovis Fredrickson, MD   200 mg at 08/21/16 1141  . levothyroxine (SYNTHROID, LEVOTHROID) tablet 75 mcg  75 mcg Oral QAC breakfast Clovis Fredrickson, MD   75 mcg at 08/21/16 0636  . liothyronine (CYTOMEL) tablet 5 mcg  5 mcg Oral Daily Clovis Fredrickson, MD   5 mcg at 08/21/16 0636  . magnesium hydroxide (MILK OF MAGNESIA) suspension 30 mL  30 mL Oral Daily PRN Clovis Fredrickson, MD   30 mL at 08/19/16 0859  . metoprolol succinate  (TOPROL-XL) 24 hr tablet 50 mg  50 mg Oral Daily [name] [name], MD   50 mg at 08/21/16 0852  . pantoprazole (PROTONIX) EC tablet 40 mg  40 mg Oral Daily Clovis Fredrickson, MD   40 mg at 08/21/16 0851  . QUEtiapine (SEROQUEL) tablet 100 mg  100 mg Oral QHS [name] [name], MD   100 mg at 08/20/16 2210   PTA Medications: Facility-Administered Medications Prior to Admission  Medication Dose Route Frequency Provider Last Rate Last Dose  . bupivacaine (PF) (MARCAINE) 0.25 % injection 30 mL  30 mL Other Once Mohammed Kindle, MD      . lactated ringers infusion 1,000 mL  1,000 mL Intravenous Continuous Mohammed Kindle, MD      . lidocaine (PF) (XYLOCAINE) 1 % injection 10 mL  10 mL Subcutaneous Once Mohammed Kindle, MD      . orphenadrine (NORFLEX) injection 60 mg  60 mg Intramuscular Once Mohammed Kindle, MD      . triamcinolone acetonide (KENALOG-40) injection 40 mg  40 mg Other Once Mohammed Kindle, MD       Prescriptions Prior to Admission  Medication Sig Dispense Refill Last Dose  . albuterol (PROVENTIL HFA;VENTOLIN HFA) 108 (90 BASE) MCG/ACT inhaler Inhale 1-2 puffs into the lungs every 6 (six) hours as needed for wheezing or shortness of breath. (Patient not taking: Reported on 08/14/2016) 1 Inhaler 0 Not Taking at Unknown time  . amitriptyline (ELAVIL) 25  MG tablet Take 10 mg by mouth at bedtime.    08/14/2016 at am  . Aspirin-Acetaminophen-Caffeine (EXCEDRIN EXTRA STRENGTH PO) Take 1 tablet by mouth daily as needed (headache). Reported on 04/28/2016   08/14/2016 at am  . b complex vitamins tablet Take 1 tablet by mouth daily. Reported on 04/28/2016   Not Taking at Unknown time  . beclomethasone (QVAR) 80 MCG/ACT inhaler Inhale 2 puffs into the lungs 2 (two) times daily. Reported on 04/16/2016   Not Taking at Unknown time  . cholecalciferol (VITAMIN D) 1000 UNITS tablet Take 1,000 Units by mouth daily. Reported on 03/05/2016   Not Taking at Unknown time  . cyclobenzaprine (FLEXERIL) 5 MG tablet  Limit 1 tablet by mouth per day or twice per day if tolerated   (NOTE PILL IS 5 MG) 50 tablet 0 08/14/2016 at am  . diazepam (VALIUM) 5 MG tablet Limit 1 tablet by mouth 1 hour prior to MRI if tolerated then take 1 tablet by mouth at the beginning of the MRI. If tolerated  May take 1 additional pill one hour later if necessary and if tolerated (Patient not taking: Reported on 08/13/2016) 3 tablet 0 Not Taking at Unknown time  . hydrOXYzine (ATARAX/VISTARIL) 25 MG tablet Take 25 mg by mouth every 6 (six) hours as needed. Reported on 04/16/2016   Not Taking at Unknown time  . liothyronine (CYTOMEL) 5 MCG tablet Take 5 mcg by mouth daily.  2 08/14/2016 at am  . metoprolol succinate (TOPROL-XL) 50 MG 24 hr tablet Take 50 mg by mouth daily. Reported on 04/16/2016  5 08/14/2016 at am  . nitroGLYCERIN (NITROSTAT) 0.4 MG SL tablet Place 0.4 mg under the tongue every 5 (five) minutes as needed for chest pain.   prn at prn  . ondansetron (ZOFRAN) 4 MG tablet Take 4 mg by mouth every 8 (eight) hours as needed for nausea or vomiting. Reported on 04/16/2016   prn at prn  . oxyCODONE (OXYCONTIN) 10 mg 12 hr tablet Take 10 mg by mouth every 6 (six) hours.   08/13/2016 at Unknown time  . pantoprazole (PROTONIX) 40 MG tablet Take 40 mg by mouth daily.   Not Taking at Unknown time  . SYNTHROID 75 MCG tablet TAKE 1 TABLET BY MOUTH EVERY DAY 90 tablet 0 08/14/2016 at am  . traMADol (ULTRAM) 50 MG tablet Limit 1/2  -  1  tablet by mouth per day or 2 times  times per day if tolerated   NO HYDROCODONE ACETAMINOPHEN 50 tablet 0 08/14/2016 at am    Treatment Modalities: Medication Management, Group therapy, Case management,  1 to 1 session with clinician, Psychoeducation, Recreational therapy.   Physician Treatment Plan for Primary Diagnosis: Major depressive disorder, single episode, severe with psychotic features ([place]) Long Term Goal(s): Improvement in symptoms so as ready for discharge   Short Term Goals: Ability to identify changes  in lifestyle to reduce recurrence of condition will improve, Ability to verbalize feelings will improve, Ability to identify and develop effective coping behaviors will improve, Ability to maintain clinical measurements within normal limits will improve and Compliance with prescribed medications will improve  Medication Management: Evaluate patient's response, side effects, and tolerance of medication regimen.  Therapeutic Interventions: 1 to 1 sessions, Unit Group sessions and Medication administration.  Evaluation of Outcomes: Adequate for Discharge  Physician Treatment Plan for Secondary Diagnosis: Principal Problem:   Major depressive disorder, single episode, severe with psychotic features [facility]) Active Problems:   Thyroid cancer ([place])  Post traumatic stress disorder (PTSD)   Gastroesophageal reflux disease   Fibromyalgia   HTN (hypertension)   COPD (chronic obstructive pulmonary disease) ([place])  Long Term Goal(s): Improvement in symptoms so as ready for discharge  Short Term Goals: Ability to identify changes in lifestyle to reduce recurrence of condition will improve, Ability to verbalize feelings will improve, Ability to identify and develop effective coping behaviors will improve, Ability to maintain clinical measurements within normal limits will improve and Compliance with prescribed medications will improve  Medication Management: Evaluate patient's response, side effects, and tolerance of medication regimen.  Therapeutic Interventions: 1 to 1 sessions, Unit Group sessions and Medication administration.  Evaluation of Outcomes: Adequate for Discharge   RN Treatment Plan for Primary Diagnosis: Major depressive disorder, single episode, severe with psychotic features ([place]) Long Term Goal(s): Knowledge of disease and therapeutic regimen to maintain health will improve  Short Term Goals: Ability to remain free from injury will improve, Ability to verbalize frustration and anger  appropriately will improve, Ability to demonstrate self-control and Ability to disclose and discuss suicidal ideas  Medication Management: RN will administer medications as ordered by provider, will assess and evaluate patient's response and provide education to patient for prescribed medication. RN will report any adverse and/or side effects to prescribing provider.  Therapeutic Interventions: 1 on 1 counseling sessions, Psychoeducation, Medication administration, Evaluate responses to treatment, Monitor vital signs and CBGs as ordered, Perform/monitor CIWA, COWS, AIMS and Fall Risk screenings as ordered, Perform wound care treatments as ordered.  Evaluation of Outcomes: Adequate for Discharge   LCSW Treatment Plan for Primary Diagnosis: Major depressive disorder, single episode, severe with psychotic features ([place]) Long Term Goal(s): Safe transition to appropriate next level of care at discharge, Engage patient in therapeutic group addressing interpersonal concerns.  Short Term Goals: Engage patient in aftercare planning with referrals and resources, Identify triggers associated with mental health/substance abuse issues and Increase skills for wellness and recovery  Therapeutic Interventions: Assess for all discharge needs, 1 to 1 time with Social worker, Explore available resources and support systems, Assess for adequacy in community support network, Educate family and significant other(s) on suicide prevention, Complete Psychosocial Assessment, Interpersonal group therapy.  Evaluation of Outcomes: Adequate for Discharge   Progress in Treatment: Attending groups: Yes. Participating in groups: Yes. Taking medication as prescribed: Yes. Toleration medication: Yes. Family/Significant other contact made: No, CSW documented contact attempts.  Patient understands diagnosis: Yes. Discussing patient identified problems/goals with staff: Yes. Medical problems stabilized or resolved:  Yes. Denies suicidal/homicidal ideation: Yes. Issues/concerns per patient self-inventory: No. Other: n/a  New problem(s) identified: None identified at this time.  New Short Term/Long Term Goal(s): None identified at this time.   Discharge Plan or Barriers: Patient will discharge home with her mother and follow-up with [facility] for outpatient therapy and medication management.   Reason for Continuation of Hospitalization: Medication stabilization  Estimated Length of Stay: Anticipated discharge 08/22/2016  Attendees: Patient: Kristina Li 08/21/2016 2:40 PM  Physician: Dr. Merlyn AlbertPatsey Berthold, MD 08/21/2016 2:40 PM  Nursing: Carolynn Sayers, RN 08/21/2016 2:40 PM  RN Care Manager: 08/21/2016 2:40 PM  Social Worker: Lear Ng. Claybon Jabs MSW, [place] 08/21/2016 2:40 PM  Recreational Therapist:  08/21/2016 2:40 PM  Other:  08/21/2016 2:40 PM  Other:  08/21/2016 2:40 PM  Other: 08/21/2016 2:40 PM    Scribe for Treatment Team: Jolaine Click, [place] 08/21/2016 2:45 PM

## 2016-08-21 NOTE — [facility] Suicide Risk Assessment (Signed)
[place] INPATIENT:  Family/Significant Other Suicide Prevention Education  Suicide Prevention Education:  Contact Attempts:Nancy Sharlett Iles (mother [phone]), has been identified by the patient as the family member/significant other with whom the patient will be residing, and identified as the person(s) who will aid the patient in the event of a mental health crisis.  With written consent from the patient, two attempts were made to provide suicide prevention education, prior to and/or following the patient's discharge.  We were unsuccessful in providing suicide prevention education.  A suicide education pamphlet was given to the patient to share with family/significant other.  Date and time of first attempt: 08-17-16/ 12:30pm (left voicemail) Date and time of second attempt: 08-20-16/ 4:50pm (left voicemail)  Clover Feehan G. [place], [place] 08/21/2016, 10:39 AM
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
## 2016-08-21 NOTE — Progress Notes (Signed)
D: Pt reports improved mood this evening. She is seen in the milieu. Pt rates anxiety 4/10 and depression 0/10. Denies SI/HI/AVH at this time. Pt requests PRN vistaril. She asks questions regarding medications appropriately. Pt c/o pain rated 4/10 with movement. She reports that she has had several bowel movements after receiving mag citrate. A: Emotional support and encouragement provided. Medications administered with education. q15 minute safety checks maintained. R: Pt remains free from harm. Will continue to monitor.

## 2016-08-21 NOTE — BHH Group Notes (Signed)
[place] Group Notes:  (Nursing/MHT/Case Management/Adjunct)  Date:  08/21/2016  Time:  4:27 PM  Type of Therapy:  Psychoeducational Skills  Participation Level:  Did Not Attend  [facility] 08/21/2016, 4:27 PM

## 2016-08-21 NOTE — Plan of Care (Signed)
Problem: Education: Goal: Knowledge of the prescribed therapeutic regimen will improve Outcome: Progressing Pt asks questions appropriately regarding prescribed medications. She verbalizes understanding of information provided.

## 2016-08-21 NOTE — Progress Notes (Signed)
Pt awake, up on unit most of the day. Attends some groups, interacts appropriately with peers. Reports improved mood. Continues to question all medications and requests print outs to refer to regarding medications and diagnosis. Requests PRN vistaril that has been discontinued. Reports depression 6/10, hopelessness 7/10, anxiety 8/10 (low 0-10 high). Denies SI/HI/AVH.  Medication compliant except Cymbalta that was later discontinued.  Safety maintained by every 15 minute checks. Support and encouragement provided. Mediations administered. Will continue to monitor.

## 2016-08-21 NOTE — Plan of Care (Signed)
Problem: Self-Concept: Goal: Ability to modify response to factors that promote anxiety will improve Outcome: Not Progressing Continues to report anxiety and displays anxiety on the unit. Coping skills encouraged. Will continue to monitor.

## 2016-08-21 NOTE — BHH Group Notes (Signed)
[place] LCSW Group Therapy   08/21/2016 9:30 am   Type of Therapy: Group Therapy   Participation Level: Pt invited but did not attend.   Participation Quality: Pt invited but did not attend.   Glorious Peach, MSW, LCSW-A 08/21/2016, 10:43AM

## 2016-08-22 MED ORDER — FENTANYL 12 MCG/HR TD PT72: 12.5000 ug | MEDICATED_PATCH | TRANSDERMAL | 0 refills | Status: DC

## 2016-08-22 MED ORDER — QUETIAPINE FUMARATE 100 MG PO TABS: 100.0000 mg | ORAL_TABLET | Freq: Every day | ORAL | 0 refills | Status: DC

## 2016-08-22 MED ORDER — GABAPENTIN 100 MG PO CAPS: 200.0000 mg | ORAL_CAPSULE | Freq: Three times a day (TID) | ORAL | 0 refills | Status: DC

## 2016-08-22 MED ORDER — AMITRIPTYLINE HCL 25 MG PO TABS: 50.0000 mg | ORAL_TABLET | Freq: Every day | ORAL | 0 refills | Status: DC

## 2016-08-22 NOTE — Plan of Care (Signed)
Problem: Coping: Goal: Ability to identify and develop effective coping behavior will improve Outcome: Progressing Pt is working on coping with anxiety by attempting to socialize with others.

## 2016-08-22 NOTE — Progress Notes (Signed)
Discharge Note:  Patient denies SI/AVH/HI.  Patient appeared to be in no acute distress at this time.  Patient discharge instructions explained and patient verbalized understanding.  Patient belongings returned and patient acknowledged receipt of those belongings.  Patient left facility with her mother.

## 2016-08-22 NOTE — BHH Suicide Risk Assessment (Signed)
[place] INPATIENT:  Family/Significant Other Suicide Prevention Education  Suicide Prevention Education: (Late entry from 08/21/2016) Education Completed;Kristina Li (mother [phone]), has been identified by the patient as the family member/significant other with whom the patient will be residing, and identified as the person(s) who will aid the patient in the event of a mental health crisis (suicidal ideations/suicide attempt).  With written consent from the patient, the family member/significant other has been provided the following suicide prevention education, prior to the and/or following the discharge of the patient. Mother stated she will be patients ride at discharge and will ensure patient goes to follow-up appointments.   The suicide prevention education provided includes the following:  Suicide risk factors  Suicide prevention and interventions  National Suicide Hotline telephone number  [facility] assessment telephone number  [facility] Emergency Assistance [place] and/or Residential Mobile Crisis Unit telephone number  Request made of family/significant other to:  Remove weapons (e.g., guns, rifles, knives), all items previously/currently identified as safety concern.    Remove drugs/medications (over-the-counter, prescriptions, illicit drugs), all items previously/currently identified as a safety concern.  The family member/significant other verbalizes understanding of the suicide prevention education information provided.  The family member/significant other agrees to remove the items of safety concern listed above.  Kristina Li, [place] 08/22/2016, 9:05 AM

## 2016-08-22 NOTE — Progress Notes (Signed)
  [facility] Adult Case Management Discharge Plan :  Will you be returning to the same living situation after discharge:  Yes,  return home to live with her mother.  At discharge, do you have transportation home?: Yes,  mother Do you have the ability to pay for your medications: Yes,  patient has insurance.  Release of information consent forms completed and in the chart;  Patient's signature needed at discharge.  Patient to Follow up at: Follow-up [facility]. Go on 08/25/2016.   Why:  Go to [facility] during walk-in hours for new clients M-F 9a-4p for outpatient therapy and medication managment follow-up. Please bring your insurance card and discharge summary. Arrive early to ensure prompt service.  Contact information: [place] [address] Phone:[phone] or [phone] [phone]          Next level of care provider has access to [place] and Suicide Prevention discussed: Yes,  with patient and mother  Have you used any form of tobacco in the last 30 days? (Cigarettes, Smokeless Tobacco, Cigars, and/or Pipes): No  Has patient been referred to the Quitline?: N/A patient is not a smoker  Patient has been referred for addiction treatment: N/A, however patient has been referred for outpatient therapy and medication management.   Tyshun Tuckerman G. [place], [place] 08/22/2016, 9:08 AM

## 2016-08-22 NOTE — BHH Group Notes (Signed)
[place] Group Notes:  (Nursing/MHT/Case Management/Adjunct)  Date:  08/22/2016  Time:  2:43 AM  Type of Therapy:  Group Therapy  Participation Level:  Active  Participation Quality:  Appropriate  Affect:  Appropriate  Cognitive:  Appropriate  Insight:  Appropriate  Engagement in Group:  Engaged  Modes of Intervention:  n/a  Summary of Progress/Problems:  Marylynn Pearson 08/22/2016, 2:43 AM
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
## 2016-08-22 NOTE — BHH Suicide Risk Assessment (Signed)
[facility] Discharge Suicide Risk Assessment   Principal Problem: Major depressive disorder, single episode, severe with psychotic features [facility]) Discharge Diagnoses:  Patient Active Problem List   Diagnosis Date Noted  . Major depressive disorder, single episode, severe with psychotic features ([place]) [F32.3] 08/15/2016  . HTN (hypertension) [I10] 08/15/2016  . COPD (chronic obstructive pulmonary disease) ([place]) [J44.9] 08/15/2016  . 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 DDD (degenerative disc disease), lumbar [M51.36] 03/18/2016  . 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 Lumbar radiculopathy [M54.16] 03/18/2016  . 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 Cervicalgia [M54.2] 01/31/2016  . Sacroiliac joint dysfunction [M53.3] 01/31/2016  . Myofascial pain [M79.1] 01/31/2016  . Fibromyalgia [M79.7] 01/31/2016  . Bilateral occipital neuralgia [M54.81] 01/31/2016  . Thyroid cancer ([place]) [C73]   . Post traumatic stress disorder (PTSD) [F43.10]   . Gastroesophageal reflux disease [K21.9]   . Endometriosis [N80.9]       Psychiatric Specialty Exam: ROS  Blood pressure 120/71, pulse 89, temperature 98.7 F (37.1 C), resp. rate 18, height 5\' 6"  (1.676 m), weight 70.8 kg (156 lb), last menstrual period 07/13/2016, SpO2 100 %.Body mass index is 25.18 kg/m.                                                       Mental Status Per Nursing Assessment::   On Admission:     Demographic Factors:  AAF   Loss Factors: Decline in physical health  Historical Factors: NA  Risk Reduction Factors:   Positive social support  Continued Clinical Symptoms:  Depression:   Insomnia Chronic Pain Previous Psychiatric Diagnoses and Treatments  Cognitive Features That Contribute To Risk:  None    Suicide Risk:  Minimal: No identifiable suicidal ideation.  Patients presenting with no risk factors but with morbid ruminations; may be classified as minimal risk based  on the severity of the depressive symptoms  Follow-up [place]. Go on 08/25/2016.   Why:  Go to [place] during walk-in hours for new clients M-F 9a-4p for outpatient therapy and medication managment follow-up. Please bring your insurance card and discharge summary. Arrive early to ensure prompt service.  Contact information: [place] [address] Phone:[phone] or [phone] Fax:[phone]          Hildred Priest, MD 08/22/2016, 11:24 AM

## 2016-08-22 NOTE — Progress Notes (Signed)
D: Observed pt in day room interacting with peers. Patient alert and oriented x4. Patient denies SI/HI/AVH. Pt affect is anxious and blunted, but appears to be brightening. Pt stated her day was "pretty good.Marland KitchenMarland KitchenI felt better today." Pt mentioned that she felt drowsy earlier today.Pt stated she's not as "down" as she was. Pt indicated that she was trying to be more social this evening. Pt rated depression 3/10 and anxiety 6/10. Pt c/o of throat pain and sinus congestion. A: Offered active listening and support. Provided therapeutic communication. Administered scheduled medications. Supported pt's efforts to increase socialization. Called doctor in regards to pt's complaints, and gave sudafed and tylenol prn per doctor's orders.  R: Pt pleasant and cooperative. Pt medication compliant. Will continue Q15 min. checks. Safety maintained.

## 2016-09-02 ENCOUNTER — Ambulatory Visit: Payer: Medicaid Other | Admitting: Pain Medicine

## 2016-10-03 DIAGNOSIS — R079 Chest pain, unspecified: Secondary | ICD-10-CM | POA: Insufficient documentation

## 2016-10-11 ENCOUNTER — Encounter: Payer: Self-pay | Admitting: Urgent Care

## 2016-10-11 ENCOUNTER — Emergency Department
Admission: EM | Admit: 2016-10-11 | Discharge: 2016-10-12 | Disposition: A | Discharge status: Home / Self Care | Payer: Medicaid Other | Attending: Emergency Medicine | Admitting: Emergency Medicine

## 2016-10-11 DIAGNOSIS — J45909 Unspecified asthma, uncomplicated: Secondary | ICD-10-CM | POA: Insufficient documentation

## 2016-10-11 DIAGNOSIS — Z85850 Personal history of malignant neoplasm of thyroid: Secondary | ICD-10-CM | POA: Diagnosis not present

## 2016-10-11 DIAGNOSIS — I10 Essential (primary) hypertension: Secondary | ICD-10-CM | POA: Diagnosis not present

## 2016-10-11 DIAGNOSIS — R0602 Shortness of breath: Secondary | ICD-10-CM | POA: Diagnosis not present

## 2016-10-11 DIAGNOSIS — Z79899 Other long term (current) drug therapy: Secondary | ICD-10-CM | POA: Insufficient documentation

## 2016-10-11 DIAGNOSIS — J449 Chronic obstructive pulmonary disease, unspecified: Secondary | ICD-10-CM | POA: Insufficient documentation

## 2016-10-11 DIAGNOSIS — E039 Hypothyroidism, unspecified: Secondary | ICD-10-CM | POA: Insufficient documentation

## 2016-10-11 DIAGNOSIS — R079 Chest pain, unspecified: Secondary | ICD-10-CM | POA: Insufficient documentation

## 2016-10-11 LAB — BASIC METABOLIC PANEL
ANION GAP: 5 (ref 5–15)
BUN: 10 mg/dL (ref 6–20)
CO2: 27 mmol/L (ref 22–32)
Calcium: 8.8 mg/dL — ABNORMAL LOW (ref 8.9–10.3)
Chloride: 104 mmol/L (ref 101–111)
Creatinine, Ser: 1.05 mg/dL — ABNORMAL HIGH (ref 0.44–1.00)
GLUCOSE: 89 mg/dL (ref 65–99)
POTASSIUM: 3.6 mmol/L (ref 3.5–5.1)
SODIUM: 136 mmol/L (ref 135–145)

## 2016-10-11 LAB — CBC
HEMATOCRIT: 34.5 % — AB (ref 35.0–47.0)
HEMOGLOBIN: 11.7 g/dL — AB (ref 12.0–16.0)
MCH: 28.5 pg (ref 26.0–34.0)
MCHC: 34 g/dL (ref 32.0–36.0)
MCV: 83.8 fL (ref 80.0–100.0)
Platelets: 233 10*3/uL (ref 150–440)
RBC: 4.11 MIL/uL (ref 3.80–5.20)
RDW: 14.3 % (ref 11.5–14.5)
WBC: 5.6 10*3/uL (ref 3.6–11.0)

## 2016-10-11 LAB — TROPONIN I: Troponin I: <0.03 ng/mL (ref <0.03)

## 2016-10-11 NOTE — ED Triage Notes (Signed)
Patient presents with c/o LEFT side chest pain that began a few hours PTA. (+) SOB reported. Patient reports that she sis feel like she was anxious to some extent, however wanted to get checked out to be safe. Patient calm in triage.

## 2016-10-12 ENCOUNTER — Emergency Department: Payer: Medicaid Other

## 2016-10-12 LAB — TROPONIN I

## 2016-10-12 MED ORDER — GI COCKTAIL ~~LOC~~
30.0000 mL | Freq: Once | ORAL | Status: AC
  Administered 2016-10-12: 30 mL via ORAL
  Filled 2016-10-12: qty 30

## 2016-10-12 MED ORDER — LORAZEPAM 0.5 MG PO TABS
0.5000 mg | ORAL_TABLET | Freq: Once | ORAL | Status: AC
  Administered 2016-10-12: 0.5 mg via ORAL
  Filled 2016-10-12: qty 1

## 2016-10-12 NOTE — Discharge Instructions (Signed)
Please follow-up with Dr. Humphrey Rolls as you have scheduled
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
## 2016-10-27 ENCOUNTER — Emergency Department
Admission: EM | Admit: 2016-10-27 | Discharge: 2016-10-28 | Disposition: A | Discharge status: Other Acute Inpatient Hospital | Payer: Medicaid Other | Attending: Emergency Medicine | Admitting: Emergency Medicine

## 2016-10-27 DIAGNOSIS — E039 Hypothyroidism, unspecified: Secondary | ICD-10-CM | POA: Insufficient documentation

## 2016-10-27 DIAGNOSIS — J449 Chronic obstructive pulmonary disease, unspecified: Secondary | ICD-10-CM | POA: Insufficient documentation

## 2016-10-27 DIAGNOSIS — I10 Essential (primary) hypertension: Secondary | ICD-10-CM | POA: Diagnosis not present

## 2016-10-27 DIAGNOSIS — F419 Anxiety disorder, unspecified: Secondary | ICD-10-CM | POA: Insufficient documentation

## 2016-10-27 DIAGNOSIS — Z79899 Other long term (current) drug therapy: Secondary | ICD-10-CM | POA: Insufficient documentation

## 2016-10-27 DIAGNOSIS — J45909 Unspecified asthma, uncomplicated: Secondary | ICD-10-CM | POA: Diagnosis not present

## 2016-10-27 DIAGNOSIS — Z85850 Personal history of malignant neoplasm of thyroid: Secondary | ICD-10-CM | POA: Insufficient documentation

## 2016-10-27 MED ORDER — LORAZEPAM 1 MG PO TABS
1.0000 mg | ORAL_TABLET | Freq: Once | ORAL | Status: AC
  Administered 2016-10-28: 1 mg via ORAL
  Filled 2016-10-27: qty 1

## 2016-10-27 NOTE — ED Triage Notes (Signed)
Pt presents to ED with increasing anxiety for the past 3 days. Denies SI or HI. Hx of the same. Pt states she just wants to "talk to someone".

## 2016-10-27 NOTE — ED Notes (Signed)
Pt brought in via ems from home with anxiety and feeling paranoid. Pt states hearing voices at times.  Pt denies SI or HI.  Pt denies drug use or etoh use.  Pt has fibromyalgia and is taking gabapentin x 1 month.  Pt alert. Calm and cooperative. Pt in hallway bed.

## 2016-10-28 ENCOUNTER — Encounter: Payer: Self-pay | Admitting: Psychiatry

## 2016-10-28 ENCOUNTER — Inpatient Hospital Stay
Admission: EM | Admit: 2016-10-28 | Discharge: 2016-11-04 | DRG: 885 | Disposition: A | Discharge status: Home / Self Care | Payer: Medicaid Other | Source: Intra-hospital | Attending: Psychiatry | Admitting: Psychiatry

## 2016-10-28 DIAGNOSIS — R0989 Other specified symptoms and signs involving the circulatory and respiratory systems: Secondary | ICD-10-CM | POA: Diagnosis present

## 2016-10-28 DIAGNOSIS — M797 Fibromyalgia: Secondary | ICD-10-CM | POA: Diagnosis present

## 2016-10-28 DIAGNOSIS — K5900 Constipation, unspecified: Secondary | ICD-10-CM | POA: Diagnosis present

## 2016-10-28 DIAGNOSIS — Z825 Family history of asthma and other chronic lower respiratory diseases: Secondary | ICD-10-CM | POA: Diagnosis not present

## 2016-10-28 DIAGNOSIS — F419 Anxiety disorder, unspecified: Secondary | ICD-10-CM | POA: Diagnosis not present

## 2016-10-28 DIAGNOSIS — E039 Hypothyroidism, unspecified: Secondary | ICD-10-CM | POA: Diagnosis present

## 2016-10-28 DIAGNOSIS — F418 Other specified anxiety disorders: Secondary | ICD-10-CM | POA: Diagnosis present

## 2016-10-28 DIAGNOSIS — R45851 Suicidal ideations: Secondary | ICD-10-CM | POA: Diagnosis present

## 2016-10-28 DIAGNOSIS — M19019 Primary osteoarthritis, unspecified shoulder: Secondary | ICD-10-CM | POA: Diagnosis present

## 2016-10-28 DIAGNOSIS — Z886 Allergy status to analgesic agent status: Secondary | ICD-10-CM | POA: Diagnosis not present

## 2016-10-28 DIAGNOSIS — Z8614 Personal history of Methicillin resistant Staphylococcus aureus infection: Secondary | ICD-10-CM | POA: Diagnosis not present

## 2016-10-28 DIAGNOSIS — I10 Essential (primary) hypertension: Secondary | ICD-10-CM | POA: Diagnosis present

## 2016-10-28 DIAGNOSIS — G8929 Other chronic pain: Secondary | ICD-10-CM | POA: Diagnosis present

## 2016-10-28 DIAGNOSIS — Z7951 Long term (current) use of inhaled steroids: Secondary | ICD-10-CM

## 2016-10-28 DIAGNOSIS — M419 Scoliosis, unspecified: Secondary | ICD-10-CM | POA: Diagnosis present

## 2016-10-28 DIAGNOSIS — Z91040 Latex allergy status: Secondary | ICD-10-CM

## 2016-10-28 DIAGNOSIS — I341 Nonrheumatic mitral (valve) prolapse: Secondary | ICD-10-CM | POA: Diagnosis present

## 2016-10-28 DIAGNOSIS — Z818 Family history of other mental and behavioral disorders: Secondary | ICD-10-CM

## 2016-10-28 DIAGNOSIS — Z85850 Personal history of malignant neoplasm of thyroid: Secondary | ICD-10-CM

## 2016-10-28 DIAGNOSIS — F333 Major depressive disorder, recurrent, severe with psychotic symptoms: Principal | ICD-10-CM | POA: Diagnosis present

## 2016-10-28 DIAGNOSIS — J449 Chronic obstructive pulmonary disease, unspecified: Secondary | ICD-10-CM | POA: Diagnosis present

## 2016-10-28 DIAGNOSIS — K219 Gastro-esophageal reflux disease without esophagitis: Secondary | ICD-10-CM | POA: Diagnosis present

## 2016-10-28 DIAGNOSIS — Z809 Family history of malignant neoplasm, unspecified: Secondary | ICD-10-CM

## 2016-10-28 DIAGNOSIS — F4310 Post-traumatic stress disorder, unspecified: Secondary | ICD-10-CM | POA: Diagnosis present

## 2016-10-28 DIAGNOSIS — G4700 Insomnia, unspecified: Secondary | ICD-10-CM | POA: Diagnosis present

## 2016-10-28 DIAGNOSIS — Z79899 Other long term (current) drug therapy: Secondary | ICD-10-CM

## 2016-10-28 DIAGNOSIS — Z8249 Family history of ischemic heart disease and other diseases of the circulatory system: Secondary | ICD-10-CM | POA: Diagnosis not present

## 2016-10-28 DIAGNOSIS — Z9109 Other allergy status, other than to drugs and biological substances: Secondary | ICD-10-CM

## 2016-10-28 DIAGNOSIS — Z91013 Allergy to seafood: Secondary | ICD-10-CM

## 2016-10-28 LAB — COMPREHENSIVE METABOLIC PANEL
ALK PHOS: 50 U/L (ref 38–126)
ALT: 16 U/L (ref 14–54)
ANION GAP: 7 (ref 5–15)
AST: 23 U/L (ref 15–41)
Albumin: 4 g/dL (ref 3.5–5.0)
BILIRUBIN TOTAL: 0.3 mg/dL (ref 0.3–1.2)
BUN: 9 mg/dL (ref 6–20)
CALCIUM: 8.8 mg/dL — AB (ref 8.9–10.3)
CO2: 29 mmol/L (ref 22–32)
Chloride: 102 mmol/L (ref 101–111)
Creatinine, Ser: 0.92 mg/dL (ref 0.44–1.00)
GFR calc non Af Amer: >60 mL/min (ref >60)
Glucose, Bld: 94 mg/dL (ref 65–99)
Potassium: 3.8 mmol/L (ref 3.5–5.1)
SODIUM: 138 mmol/L (ref 135–145)
TOTAL PROTEIN: 7.1 g/dL (ref 6.5–8.1)

## 2016-10-28 LAB — CBC
HCT: 35.1 % (ref 35.0–47.0)
Hemoglobin: 11.6 g/dL — ABNORMAL LOW (ref 12.0–16.0)
MCH: 27.5 pg (ref 26.0–34.0)
MCHC: 33.2 g/dL (ref 32.0–36.0)
MCV: 82.8 fL (ref 80.0–100.0)
PLATELETS: 234 10*3/uL (ref 150–440)
RBC: 4.23 MIL/uL (ref 3.80–5.20)
RDW: 14.2 % (ref 11.5–14.5)
WBC: 5.5 10*3/uL (ref 3.6–11.0)

## 2016-10-28 LAB — URINE DRUG SCREEN, QUALITATIVE (ARMC ONLY)
Amphetamines, Ur Screen: NOT DETECTED
BARBITURATES, UR SCREEN: NOT DETECTED
BENZODIAZEPINE, UR SCRN: NOT DETECTED
Cannabinoid 50 Ng, Ur ~~LOC~~: NOT DETECTED
Cocaine Metabolite,Ur ~~LOC~~: NOT DETECTED
MDMA (Ecstasy)Ur Screen: NOT DETECTED
METHADONE SCREEN, URINE: NOT DETECTED
Opiate, Ur Screen: NOT DETECTED
Phencyclidine (PCP) Ur S: NOT DETECTED
TRICYCLIC, UR SCREEN: POSITIVE — AB

## 2016-10-28 LAB — ACETAMINOPHEN LEVEL

## 2016-10-28 LAB — SALICYLATE LEVEL

## 2016-10-28 LAB — ETHANOL: Alcohol, Ethyl (B): <5 mg/dL (ref <5)

## 2016-10-28 LAB — POCT PREGNANCY, URINE: PREG TEST UR: NEGATIVE

## 2016-10-28 MED ORDER — ALBUTEROL SULFATE HFA 108 (90 BASE) MCG/ACT IN AERS
1.0000 | INHALATION_SPRAY | Freq: Four times a day (QID) | RESPIRATORY_TRACT | Status: DC | PRN
  Administered 2016-10-29 – 2016-11-03 (×5): 2 via RESPIRATORY_TRACT
  Filled 2016-10-28: qty 6.7

## 2016-10-28 MED ORDER — GABAPENTIN 100 MG PO CAPS
ORAL_CAPSULE | ORAL | Status: AC
  Administered 2016-10-28: 100 mg
  Filled 2016-10-28: qty 1

## 2016-10-28 MED ORDER — ALUM & MAG HYDROXIDE-SIMETH 200-200-20 MG/5ML PO SUSP
30.0000 mL | ORAL | Status: DC | PRN
  Administered 2016-10-31 – 2016-11-03 (×2): 30 mL via ORAL
  Filled 2016-10-28 (×3): qty 30

## 2016-10-28 MED ORDER — MAGNESIUM HYDROXIDE 400 MG/5ML PO SUSP
30.0000 mL | Freq: Every day | ORAL | Status: DC | PRN
  Administered 2016-11-01 – 2016-11-03 (×2): 30 mL via ORAL
  Filled 2016-10-28 (×2): qty 30

## 2016-10-28 MED ORDER — IBUPROFEN 400 MG PO TABS
ORAL_TABLET | ORAL | Status: AC
  Filled 2016-10-28: qty 1

## 2016-10-28 MED ORDER — ACETAMINOPHEN 325 MG PO TABS
650.0000 mg | ORAL_TABLET | Freq: Four times a day (QID) | ORAL | Status: DC | PRN
  Administered 2016-10-29 – 2016-11-02 (×4): 650 mg via ORAL
  Filled 2016-10-28 (×3): qty 2

## 2016-10-28 MED ORDER — GABAPENTIN 100 MG PO CAPS
200.0000 mg | ORAL_CAPSULE | Freq: Three times a day (TID) | ORAL | Status: DC
  Administered 2016-10-28 – 2016-11-04 (×21): 200 mg via ORAL
  Filled 2016-10-28 (×20): qty 2

## 2016-10-28 MED ORDER — METOPROLOL SUCCINATE ER 50 MG PO TB24
50.0000 mg | ORAL_TABLET | Freq: Every day | ORAL | Status: DC
  Administered 2016-10-28 – 2016-11-04 (×8): 50 mg via ORAL
  Filled 2016-10-28 (×7): qty 1

## 2016-10-28 MED ORDER — LIOTHYRONINE SODIUM 25 MCG PO TABS
25.0000 ug | ORAL_TABLET | Freq: Every day | ORAL | Status: DC
Start: 2016-10-29 — End: 2016-11-04
  Administered 2016-10-29 – 2016-11-04 (×7): 25 ug via ORAL
  Filled 2016-10-28 (×11): qty 1

## 2016-10-28 MED ORDER — HYDROXYZINE HCL 25 MG PO TABS
25.0000 mg | ORAL_TABLET | Freq: Three times a day (TID) | ORAL | Status: DC | PRN
  Administered 2016-10-28 – 2016-10-31 (×5): 25 mg via ORAL
  Filled 2016-10-28 (×4): qty 1

## 2016-10-28 MED ORDER — NICOTINE 21 MG/24HR TD PT24: 21.0000 mg | MEDICATED_PATCH | Freq: Every day | TRANSDERMAL | Status: DC | PRN

## 2016-10-28 MED ORDER — AMITRIPTYLINE HCL 50 MG PO TABS
50.0000 mg | ORAL_TABLET | Freq: Every day | ORAL | Status: DC
  Administered 2016-10-28 – 2016-11-03 (×7): 50 mg via ORAL
  Filled 2016-10-28 (×8): qty 1

## 2016-10-28 MED ORDER — TRAZODONE HCL 50 MG PO TABS
50.0000 mg | ORAL_TABLET | Freq: Every evening | ORAL | Status: DC | PRN
  Administered 2016-10-28 – 2016-11-03 (×5): 50 mg via ORAL
  Filled 2016-10-28 (×4): qty 1

## 2016-10-28 MED ORDER — AMITRIPTYLINE HCL 10 MG PO TABS: 50.0000 mg | ORAL_TABLET | Freq: Every day | ORAL | Status: DC

## 2016-10-28 MED ORDER — LEVOTHYROXINE SODIUM 75 MCG PO TABS
75.0000 ug | ORAL_TABLET | Freq: Every day | ORAL | Status: DC
  Administered 2016-10-29 – 2016-11-04 (×7): 75 ug via ORAL
  Filled 2016-10-28 (×8): qty 1

## 2016-10-28 MED ORDER — ARIPIPRAZOLE 5 MG PO TABS: 5.0000 mg | ORAL_TABLET | Freq: Every day | ORAL | Status: DC

## 2016-10-28 MED ORDER — ARIPIPRAZOLE 10 MG PO TABS
5.0000 mg | ORAL_TABLET | Freq: Every day | ORAL | Status: DC
  Administered 2016-10-28 – 2016-11-01 (×5): 5 mg via ORAL
  Filled 2016-10-28 (×5): qty 1

## 2016-10-28 NOTE — Tx Team (Signed)
Initial Treatment Plan 10/28/2016 6:18 PM Kristina Li [id number]    PATIENT STRESSORS: Educational concerns Health problems   PATIENT STRENGTHS: Ability for insight Capable of independent living Communication skills Supportive family/friends   PATIENT IDENTIFIED PROBLEMS: Depression 10/28/16  Psychosis 10/28/16                   DISCHARGE CRITERIA:  Ability to meet basic life and health needs Improved stabilization in mood, thinking, and/or behavior Medical problems require only outpatient monitoring  PRELIMINARY DISCHARGE PLAN: Outpatient therapy Return to previous living arrangement  PATIENT/FAMILY INVOLVEMENT: This treatment plan has been presented to and reviewed with the patient, Kristina Li, and/or family member,  The patient and family have been given the opportunity to ask questions and make suggestions.  Leodis Liverpool, RN 10/28/2016, 6:18 PM

## 2016-10-28 NOTE — ED Notes (Signed)
Pt resting with eyes closed.
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
## 2016-10-28 NOTE — ED Notes (Signed)
Gave pt apple juice  

## 2016-10-28 NOTE — ED Notes (Signed)
Pt given apple juice  

## 2016-10-28 NOTE — ED Notes (Signed)
Pt in hallway bed and is unable to go to bhu at this time due to population of patients in [place].  Charge nurse andrea rn aware and dr. Owens Shark aware.  Pt also made aware.

## 2016-10-28 NOTE — Plan of Care (Signed)
Problem: Education: Goal: Knowledge of [place] [facility] Education information/materials will improve Outcome: Not Progressing New Admission  reviewing admission criteria

## 2016-10-28 NOTE — ED Notes (Signed)
Pt belongings, glasses taken to [facility] behavioal med unit

## 2016-10-28 NOTE — ED Notes (Signed)
meds given

## 2016-10-28 NOTE — ED Notes (Signed)
Pt moved into 23 to get out of hallway.

## 2016-10-28 NOTE — BH Assessment (Signed)
Patient is to be admitted to Psych Inpatient, per [facility]. [facility] attending physician (Dr. Jerilee Hoh) will writer admission orders..  Attending Physician will be Dr. Bary Leriche.   Patient has been assigned to room 323, by [facility] Charge Nurse [place]   Intake Paper Work has been signed and placed on patient chart.  ER staff is aware of the admission Holley Raring, ER Sect.; Dr. Denzil Hughes, ER MD; [facility], [place], Patient Access).

## 2016-10-28 NOTE — ED Notes (Signed)
Pt in quad shower at this time. Pt given clean set of scrubs and supplies needed to bathe.

## 2016-10-29 DIAGNOSIS — F333 Major depressive disorder, recurrent, severe with psychotic symptoms: Principal | ICD-10-CM

## 2016-10-29 MED ORDER — DIPHENHYDRAMINE HCL 25 MG PO CAPS
50.0000 mg | ORAL_CAPSULE | Freq: Four times a day (QID) | ORAL | Status: DC | PRN
  Administered 2016-10-29: 50 mg via ORAL
  Filled 2016-10-29: qty 2

## 2016-10-29 NOTE — Progress Notes (Signed)
D: Pt denies SI/HI/AVH, affect is flat and sad. Patient's thoughts are organized no bizarre behavior noted. Pt is cooperative with treatment plan. Patient ppears less anxious and minimal interaction with peers and staff.  A: Pt was offered support and encouragement. Pt was given scheduled medications. Pt was encouraged to attend groups. Q 15 minute checks were done for safety.  R:Pt did not attend group. Pt is taking medication. Pt is  receptive to treatment and safety maintained on unit.

## 2016-10-29 NOTE — Plan of Care (Signed)
Problem: Coping: Goal: Ability to verbalize feelings will improve Outcome: Progressing Patient verbalized feelings to staff.    

## 2016-10-29 NOTE — BHH Group Notes (Signed)
[facility] LCSW Group Therapy Note  Date/Time 10/29/2016  Type of Therapy/Topic:  Group Therapy:  Emotion Regulation  Participation Level:  Active Mood: Reported okay mood  Description of Group:    The purpose of this group is to assist patients in learning to regulate negative emotions and experience positive emotions. Patients will be guided to discuss ways in which they have been vulnerable to their negative emotions. These vulnerabilities will be juxtaposed with experiences of positive emotions or situations, and patients challenged to use positive emotions to combat negative ones. Special emphasis will be placed on coping with negative emotions in conflict situations, and patients will process healthy conflict resolution skills.  Therapeutic Goals: 1. Patient will identify two positive emotions or experiences to reflect on in order to balance out negative emotions:  2. Patient will label two or more emotions that they find the most difficult to experience:  3. Patient will be able to demonstrate positive conflict resolution skills through discussion or role plays:   Summary of Patient Progress:  Pt able to meet therapeutic goals and has good insight into her emotions.    Therapeutic Modalities:   Cognitive Behavioral Therapy Feelings Identification Dialectical Behavioral Therapy  Dossie Arbour, MSW, LCSW
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
## 2016-10-29 NOTE — [facility] Suicide Risk Assessment (Signed)
[facility] INPATIENT:  Family/Significant Other Suicide Prevention Education  Suicide Prevention Education:  Patient Refusal for Family/Significant Other Suicide Prevention Education: The patient Kristina Li has refused to provide written consent for family/significant other to be provided Family/Significant Other Suicide Prevention Education during admission and/or prior to discharge.  Physician notified.  CSW completed SPE with the pt.    Alphonse Guild Nalany Steedley 10/29/2016, 3:28 PM
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
## 2016-10-29 NOTE — [facility] Suicide Risk Assessment (Signed)
[facility] Admission Suicide Risk Assessment   Nursing information obtained from:    Demographic factors:    Current Mental Status:    Loss Factors:    Historical Factors:    Risk Reduction Factors:     Total Time spent with patient: 1 hour Principal Problem: Major depressive disorder, recurrent, severe with psychotic features ([place]) Diagnosis:   Patient Active Problem List   Diagnosis Date Noted  . Major depressive disorder, recurrent, severe with psychotic features ([place]) [F33.3] 08/15/2016  . HTN (hypertension) [I10] 08/15/2016  . COPD (chronic obstructive pulmonary disease) ([place]) [J44.9] 08/15/2016  . 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 DDD (degenerative disc disease), lumbar [M51.36] 03/18/2016  . Facet syndrome, lumbar [M12.88] 03/18/2016  . Lumbar radiculopathy [M54.16] 03/18/2016  . 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 Cervicalgia [M54.2] 01/31/2016  . Sacroiliac joint dysfunction [M53.3] 01/31/2016  . Myofascial pain [M79.1] 01/31/2016  . Fibromyalgia [M79.7] 01/31/2016  . Bilateral occipital neuralgia [M54.81] 01/31/2016  . Thyroid cancer ([place]) [C73]   . Post traumatic stress disorder (PTSD) [F43.10]   . Gastroesophageal reflux disease [K21.9]   . Endometriosis [N80.9]    Subjective Data: depression, psychosis.  Continued Clinical Symptoms:  Alcohol Use Disorder Identification Test Final Score (AUDIT): 0 The "Alcohol Use Disorders Identification Test", Guidelines for Use in Primary Care, Second Edition.  World Pharmacologist [facility]). Score between 0-7:  no or low risk or alcohol related problems. Score between 8-15:  moderate risk of alcohol related problems. Score between 16-19:  high risk of alcohol related problems. Score 20 or above:  warrants further diagnostic evaluation for alcohol dependence and treatment.   CLINICAL FACTORS:   Depression:   Hopelessness Impulsivity Insomnia Chronic Pain Currently Psychotic   Musculoskeletal: Strength &  Muscle Tone: within normal limits Gait & Station: normal Patient leans: N/A  Psychiatric Specialty Exam: Physical Exam  Nursing note and vitals reviewed.   Review of Systems  Psychiatric/Behavioral: Positive for depression, hallucinations and suicidal ideas. The patient is nervous/anxious and has insomnia.   All other systems reviewed and are negative.   Blood pressure (!) 128/109, pulse (!) 104, temperature 98.6 F (37 C), resp. rate 18, height 5' 6.5" (1.689 m), weight 74.8 kg (165 lb), last menstrual period 10/27/2016, SpO2 100 %.Body mass index is 26.23 kg/m.  General Appearance: Casual  Eye Contact:  Fair  Speech:  Clear and Coherent  Volume:  Decreased  Mood:  Depressed, Hopeless and Worthless  Affect:  Blunt  Thought Process:  Goal Directed and Descriptions of Associations: Intact  Orientation:  Full (Time, Place, and Person)  Thought Content:  Delusions, Hallucinations: Auditory and Paranoid Ideation  Suicidal Thoughts:  No  Homicidal Thoughts:  No  Memory:  Immediate;   Fair Recent;   Fair Remote;   Fair  Judgement:  Impaired  Insight:  Lacking  Psychomotor Activity:  Psychomotor Retardation  Concentration:  Concentration: Fair and Attention Span: Fair  Recall:  [employer] of Knowledge:  Fair  Language:  Fair  Akathisia:  No  Handed:  Right  AIMS (if indicated):     Assets:  Communication Skills Desire for Improvement Financial Resources/Insurance Housing Resilience Social Support  ADL's:  Intact  Cognition:  WNL  Sleep:  Number of Hours: 6.5      COGNITIVE FEATURES THAT CONTRIBUTE TO RISK:  None    SUICIDE RISK:   Moderate:  Frequent suicidal ideation with limited intensity, and duration, some specificity in terms of plans, no associated intent, good  self-control, limited dysphoria/symptomatology, some risk factors present, and identifiable protective factors, including available and accessible social support.   PLAN OF CARE: Hospital admission,  medication management, discharge planning.  Kristina Li is a 35 year old female with a history of depression, anxiety, and chronic pain admitted for paranoid ideation and worsening of depression.   1. Mood and psychosis. She was started on Abilify 5 mg psychosis and 50 mg of Elavil for depression.  2. History of thyroid cancer. We'll continue Cytomel and Synthroid.  3. COPD. She is on albuterol.  4. Chronic pain. She is on Neurontin.   5. Hypertension. She is on Toprol.  6. GERD. She is on Protonix.  7. Metabolic syndrome monitoring. Lipid profile, hemoglobin A1c and TSH were done during previous admision.    8. PTSD. She is unable to tolerate Minipress. Hydroxyzine is available.  9. Smoking. Nicotine patch is available.  10. Insomnia. Trazodone is available.   11. Disposition. She will be discharged to home. She will follow up with [facility].  I certify that inpatient services furnished can reasonably be expected to improve the patient's condition.  Orson Slick, MD 10/29/2016, 11:26 AM

## 2016-10-29 NOTE — Progress Notes (Signed)
Recreation Therapy Notes  Date: 11.22.17 Time: 1:00 pm Location: Craft Room  Group Topic: Self-esteem  Goal Area(s) Addresses:  Patient will be able to identify benefit of self-esteem. Patient will be able to identify ways to increase self-esteem.  Behavioral Response: Attentive, Interactive  Intervention: Self-Portrait  Activity: Patients were given a blank face worksheet and were instructed to draw their self-portrait of how they were feeling. Patients were then given paper and were instructed to write their name and a positive trait about themselves. Patient passed the papers around and wrote positive traits about peers. Patients were instructed to draw their self-portrait again based off how they felt after reading positive comments.  Education: LRT educated patients on ways they can increase their self-esteem.  Education Outcome: Acknowledges education/In group clarification offered   Clinical Observations/Feedback: Patient drew both self-portraits. Patient wrote positive trait about self and peers. Patient contributed to group discussion.  Leonette Monarch, LRT/CTRS 10/29/2016 3:28 PM

## 2016-10-29 NOTE — Progress Notes (Signed)
Patient denies suicidal or homicidal ideations and AV hallucinations.Appropriate with staff & peers.Compliant with medications.Attended groups.Appetite & energy level good.Support & encouragement given.

## 2016-10-30 MED ORDER — PSEUDOEPHEDRINE HCL 30 MG PO TABS
30.0000 mg | ORAL_TABLET | Freq: Three times a day (TID) | ORAL | Status: DC | PRN
  Administered 2016-10-30 – 2016-11-03 (×4): 30 mg via ORAL
  Filled 2016-10-30 (×4): qty 1
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
## 2016-10-30 NOTE — Progress Notes (Signed)
D: Patient is alert and oriented on the unit this shift. Patient isolative , not complient with  groups today. Patient denies suicidal ideation, homicidal ideation, auditory or visual hallucinations at the present time.  A: Scheduled medications are administered to patient as per MD orders. Emotional support and encouragement are provided. Patient is maintained on q.15 minute safety checks. Patient is informed to notify staff with questions or concerns. R: No adverse medication reactions are noted. Patient is cooperative with medication administration and treatment plan today. Patient is receptive,  anxious and cooperative on the unit at this time. Patient does not interact with others on the unit this shift. Patient contracts for safety at this time. Patient remains safe at this time.

## 2016-10-30 NOTE — Plan of Care (Signed)
Problem: Coping: Goal: Ability to cope will improve Outcome: Progressing Patient does have insight into her depression, will participate in activities, meal and groups, encouraged to vent regarding feelings and thoughts.

## 2016-10-30 NOTE — Progress Notes (Signed)
D: Pt denies SI/HI/AVH. Pt is cooperative with treatment plan, less irritable  Thoughts are organized and logical.  Pt appears less anxious minimal  Interaction with peers and staff.  A: Pt was offered support and encouragement. Pt was given scheduled medications. Pt was encouraged to attend groups. Q 15 minute checks were done for safety.  R:Pt did not attend group. Pt is taking medication. .Pt receptive to treatment and safety maintained on unit.

## 2016-10-30 NOTE — Plan of Care (Signed)
Problem: Self-Concept: Goal: Level of anxiety will decrease Outcome: Progressing Patient expressed less anxiety.

## 2016-10-30 NOTE — Plan of Care (Signed)
Problem: Coping: Goal: Ability to cope will improve Outcome: Not Progressing Pt has no coping skills to utilize on the unit at this time [employer]

## 2016-10-30 NOTE — [facility] Group Notes (Signed)
[place] Group Notes:  (Nursing/MHT/Case Management/Adjunct)  Date:  10/30/2016  Time:  2:57 AM  Type of Therapy:  Psychoeducational Skills  Participation Level:  Did Not Attend  Summary of Progress/Problems:  Reece Agar 10/30/2016, 2:57 AM

## 2016-10-31 MED ORDER — HYDROXYZINE HCL 10 MG PO TABS
10.0000 mg | ORAL_TABLET | Freq: Three times a day (TID) | ORAL | Status: DC | PRN
  Administered 2016-11-01 – 2016-11-04 (×5): 10 mg via ORAL
  Filled 2016-10-31 (×8): qty 1
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
## 2016-10-31 NOTE — BHH Group Notes (Signed)
[place] Group Notes:  (Nursing/MHT/Case Management/Adjunct)  Date:  10/31/2016  Time:  3:43 AM  Type of Therapy:  Group Therapy  Participation Level:  Did Not Attend    Kristina Li 10/31/2016, 3:43 AM

## 2016-10-31 NOTE — Social Work (Addendum)
[place] LCSW Group Therapy   10/31/2016 9:30 AM   Type of Therapy: Group Therapy   Participation Level: Active   Participation Quality: Attentive, Sharing and Supportive   Affect: Appropriate   Cognitive: Alert and Oriented   Insight: Developing/Improving and Engaged   Engagement in Therapy: Developing/Improving and Engaged   Modes of Intervention: Clarification, Confrontation, Discussion, Education, Exploration, Limit-setting, Orientation, Problem-solving, Rapport Building, Art therapist, Socialization and Support   Summary of Progress/Problems: The topic for today was feelings about relapse. Pt discussed what relapse prevention is to them and identified triggers that they are on the path to relapse. Pt processed their feeling towards relapse and was able to relate to peers. Pt discussed coping skills that can be used for relapse prevention. Pt defined a relapse as "talking or revisiting negative situations." Pt identified group therapy, medication, and exercising as coping mechanisms to reduce exposure to triggers and decrease chances of relapse.     Glorious Peach, MSW, LCSWA 10/31/2016, 11:19AM

## 2016-10-31 NOTE — Progress Notes (Signed)
Patient is calm & cooperative in the unit.Denies suicidal or homicidal ideations and AV hallucinations.Patient called staff to her room this morning stated that someone is in her room but staff could not find anyone.Patient did not ask for any PRN pain medicine.Compliant with meds.Attended groups.Support & encouragement given.

## 2016-11-01 MED ORDER — GUAIFENESIN ER 600 MG PO TB12
600.0000 mg | ORAL_TABLET | Freq: Two times a day (BID) | ORAL | Status: DC | PRN
  Administered 2016-11-03: 600 mg via ORAL
  Filled 2016-11-01 (×2): qty 1

## 2016-11-01 MED ORDER — PANTOPRAZOLE SODIUM 40 MG PO TBEC
40.0000 mg | DELAYED_RELEASE_TABLET | Freq: Every day | ORAL | Status: DC
  Administered 2016-11-01 – 2016-11-04 (×4): 40 mg via ORAL
  Filled 2016-11-01 (×4): qty 1

## 2016-11-01 NOTE — Progress Notes (Signed)
Pt denied SI/HI/AVH. Affect blunted. Cooperative with care and appropriate with staff and peers. Medication compliant. Requested information to be printed on Fibromyalgia and Hypothyroidism. Voices no additional concerns at this time. Will continue to monitor.

## 2016-11-01 NOTE — BHH Group Notes (Signed)
[place] LCSW Group Therapy  11/01/2016 2:25 PM  Type of Therapy:  Group Therapy  Participation Level:  Patient did not attend group. CSW invited patient to group.   Summary of Progress/Problems:Coping Skills: Patients defined and discussed healthy coping skills. Patients identified healthy coping skills they would like to try during hospitalization and after discharge. CSW offered insight to varying coping skills that may have been new to patients such as practicing mindfulness.  Hodges Treiber G. [place], [place] 11/01/2016, 2:25 PM

## 2016-11-01 NOTE — BHH Group Notes (Signed)
[place] Group Notes:  (Nursing/MHT/Case Management/Adjunct)  Date:  11/01/2016  Time:  11:42 PM  Type of Therapy:  Psychoeducational Skills  Participation Level:  Active  Participation Quality:  Attentive and Resistant  Affect:  Appropriate  Cognitive:  Alert and Oriented  Insight:  Good  Engagement in Group:  Developing/Improving  Modes of Intervention:  Discussion, Education and Exploration  Summary of Progress/Problems:  Kathi Ludwig 11/01/2016, 11:42 PM

## 2016-11-01 NOTE — Progress Notes (Signed)
Pt has been pleasant and cooperative. Pt's mood and affect has been depressed.  Pt needs some  Redirecting.Pt has been seclusive with limited verbal interactions. Some med seeking behaviors noted.
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
## 2016-11-02 MED ORDER — ARIPIPRAZOLE 10 MG PO TABS
10.0000 mg | ORAL_TABLET | Freq: Every day | ORAL | Status: DC
  Administered 2016-11-02 – 2016-11-04 (×3): 10 mg via ORAL
  Filled 2016-11-02 (×3): qty 1

## 2016-11-02 NOTE — Progress Notes (Signed)
Pt has been pleasant and cooperative. Pt's mood and affect has been depressed.  Pt needs some  Redirecting.Pt has been seclusive with limited verbal interactions. Some med seeking behaviors noted. Pt has been seclusive to her room most of the day. Pt states she feels better.

## 2016-11-02 NOTE — Progress Notes (Signed)
Pt denies SI/HI/AVH. Affect blunted. Delayed responses. Appears paranoid at times and at times to be responding to internal stimuli. Attended evening group. Medication compliant. Minimal interaction with staff and peers. Appropriate behaviors. Voices no additional concerns at this time. Safety maintained. Will continue to monitor.

## 2016-11-02 NOTE — [facility] Group Notes (Signed)
[place] LCSW Group Therapy  11/02/2016 2:51 PM  Type of Therapy:  Group Therapy  Participation Level:  Patient did not attend group. CSW invited patient to group.   Summary of Progress/Problems:Stress management: Patients defined and discussed the topic of stress and the related symptoms and triggers for stress. Patients identified healthy coping skills they would like to try during hospitalization and after discharge to manage stress in a healthy way. CSW offered insight to varying stress management techniques.   Kristina Li, [place] 11/02/2016, 2:51 PM
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
## 2016-11-03 MED ORDER — TUBERCULIN PPD 5 UNIT/0.1ML ID SOLN
5.0000 [IU] | Freq: Once | INTRADERMAL | Status: DC
  Filled 2016-11-03: qty 0.1

## 2016-11-03 NOTE — Progress Notes (Signed)
Pt awake, alert, oriented and up on unit today. Appropriately interacts with staff/peers. Socializes in dayroom much of the day. Denies SI/HI/AVH. Continues to be slow with responses/interactions. Brightens on approach. Complains of constipation this afternoon-MOM administered as ordered and prune juice ordered from dietary per pt's request. Fixated on pain due to fibromyalgia and needing assistance. Pt refuses to carry her tray at meal times, but is fully able to complete ADLs independently. Medication compliant.   Support and encouragement provided with use of therapeutic communication. Medications administered as ordered with education. Safety maintained with every 15 minute checks. Will continue to monitor.
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
## 2016-11-03 NOTE — Progress Notes (Signed)
Pt denies any SI/HI/AVH. Attended evening group. Visible in milieu with appropriate behaviors among staff and peers. Slow to respond to assessment questioning. Mood noted to be brighter than previous two evenings. Pleasant during interaction. Medication compliant. Voices no additional concerns at this time. Safety maintained. Will continue to monitor.

## 2016-11-03 NOTE — Plan of Care (Signed)
Problem: Education: Goal: Will be free of psychotic symptoms Outcome: Progressing Paranoia has decreased. Pt does socialize in dayroom, attend groups, is active in milieu. Denies SI/HI/AVH.

## 2016-11-03 NOTE — Progress Notes (Signed)
PT was pleasant and conversational in day room. Was completing work in Education officer, community. PT talked briefly with other Pts and then shared what she feels has been progress made during her stay. After about 20 minutes PTdismissed herself. PT did indicated she would be open to further conversation. [place] will follow-up.

## 2016-11-04 MED ORDER — CYCLOBENZAPRINE HCL 5 MG PO TABS
5.0000 mg | ORAL_TABLET | Freq: Three times a day (TID) | ORAL | 1 refills | Status: DC | PRN
Start: 2016-11-04 — End: 2017-04-17

## 2016-11-04 MED ORDER — ARIPIPRAZOLE ER 400 MG IM SRER
400.0000 mg | INTRAMUSCULAR | Status: DC
  Administered 2016-11-04: 400 mg via INTRAMUSCULAR
  Filled 2016-11-04: qty 400

## 2016-11-04 MED ORDER — PANTOPRAZOLE SODIUM 40 MG PO TBEC: 40.0000 mg | DELAYED_RELEASE_TABLET | Freq: Every day | ORAL | 1 refills | Status: DC

## 2016-11-04 MED ORDER — LEVOTHYROXINE SODIUM 75 MCG PO TABS: 75.0000 ug | ORAL_TABLET | Freq: Every day | ORAL | 3 refills | Status: DC

## 2016-11-04 MED ORDER — GABAPENTIN 100 MG PO CAPS: 200.0000 mg | ORAL_CAPSULE | Freq: Three times a day (TID) | ORAL | 1 refills | Status: DC

## 2016-11-04 MED ORDER — LIOTHYRONINE SODIUM 25 MCG PO TABS: 25.0000 ug | ORAL_TABLET | Freq: Every day | ORAL | 2 refills | Status: DC

## 2016-11-04 MED ORDER — METOPROLOL SUCCINATE ER 50 MG PO TB24: 50.0000 mg | ORAL_TABLET | Freq: Every day | ORAL | 5 refills | Status: DC

## 2016-11-04 MED ORDER — AMITRIPTYLINE HCL 25 MG PO TABS: 50.0000 mg | ORAL_TABLET | Freq: Every day | ORAL | 0 refills | Status: DC

## 2016-11-04 MED ORDER — TRAZODONE HCL 50 MG PO TABS: 50.0000 mg | ORAL_TABLET | Freq: Every evening | ORAL | 1 refills | Status: DC | PRN

## 2016-11-04 MED ORDER — CYCLOBENZAPRINE HCL 10 MG PO TABS
5.0000 mg | ORAL_TABLET | Freq: Three times a day (TID) | ORAL | Status: DC | PRN
  Administered 2016-11-04: 5 mg via ORAL
  Filled 2016-11-04: qty 1

## 2016-11-04 MED ORDER — POLYETHYLENE GLYCOL 3350 17 G PO PACK: 17.0000 g | PACK | Freq: Every day | ORAL | Status: DC

## 2016-11-04 MED ORDER — ARIPIPRAZOLE ER 400 MG IM SRER
400.0000 mg | INTRAMUSCULAR | 1 refills | Status: DC
Start: 2016-11-04 — End: 2017-04-17

## 2016-11-04 MED ORDER — POLYETHYLENE GLYCOL 3350 17 G PO PACK: 17.0000 g | PACK | Freq: Every day | ORAL | 0 refills | Status: DC

## 2016-11-04 MED ORDER — ARIPIPRAZOLE 10 MG PO TABS: 10.0000 mg | ORAL_TABLET | Freq: Every day | ORAL | 0 refills | Status: DC

## 2016-11-04 MED ORDER — ALBUTEROL SULFATE HFA 108 (90 BASE) MCG/ACT IN AERS: 1.0000 | INHALATION_SPRAY | Freq: Four times a day (QID) | RESPIRATORY_TRACT | 0 refills | Status: DC | PRN

## 2016-11-04 NOTE — Tx Team (Signed)
Interdisciplinary Treatment and Diagnostic Plan Update  11/04/2016 Time of Session: 10:30am Kristina Li MRN: [id number]  Principal Diagnosis: Major depressive disorder, recurrent, severe with psychotic features [facility])  Secondary Diagnoses: Principal Problem:   Major depressive disorder, recurrent, severe with psychotic features ([place]) Active Problems:   Post traumatic stress disorder (PTSD)   Fibromyalgia   HTN (hypertension)   COPD (chronic obstructive pulmonary disease) ([place])   Current Medications:  Current Facility-Administered Medications  Medication Dose Route Frequency Provider Last Rate Last Dose  . acetaminophen (TYLENOL) tablet 650 mg  650 mg Oral Q6H PRN Hildred Priest, MD   650 mg at 11/02/16 1131  . albuterol (PROVENTIL HFA;VENTOLIN HFA) 108 (90 Base) MCG/ACT inhaler 1-2 puff  1-2 puff Inhalation Q6H PRN Hildred Priest, MD   2 puff at 11/03/16 0859  . alum & mag hydroxide-simeth (MAALOX/MYLANTA) 200-200-20 MG/5ML suspension 30 mL  30 mL Oral Q4H PRN Hildred Priest, MD   30 mL at 11/03/16 1745  . amitriptyline (ELAVIL) tablet 50 mg  50 mg Oral QHS Hildred Priest, MD   50 mg at 11/03/16 2210  . ARIPiprazole (ABILIFY) tablet 10 mg  10 mg Oral Daily Chauncey Mann, MD   10 mg at 11/04/16 0846  . gabapentin (NEURONTIN) capsule 200 mg  200 mg Oral TID Hildred Priest, MD   200 mg at 11/04/16 0846  . guaiFENesin (MUCINEX) 12 hr tablet 600 mg  600 mg Oral BID PRN Chauncey Mann, MD   600 mg at 11/03/16 1745  . hydrOXYzine (ATARAX/VISTARIL) tablet 10 mg  10 mg Oral TID PRN Rainey Pines, MD   10 mg at 11/04/16 0640  . levothyroxine (SYNTHROID, LEVOTHROID) tablet 75 mcg  75 mcg Oral QAC breakfast Hildred Priest, MD   75 mcg at 11/04/16 [phone]  . liothyronine (CYTOMEL) tablet 25 mcg  25 mcg Oral QAC breakfast Hildred Priest, MD   25 mcg at 11/04/16 [phone]  . magnesium hydroxide (MILK OF MAGNESIA) suspension 30  mL  30 mL Oral Daily PRN Hildred Priest, MD   30 mL at 11/03/16 1435  . metoprolol succinate (TOPROL-XL) 24 hr tablet 50 mg  50 mg Oral Daily Hildred Priest, MD   50 mg at 11/04/16 0846  . nicotine (NICODERM CQ - dosed in mg/24 hours) patch 21 mg  21 mg Transdermal Daily PRN Hildred Priest, MD      . pantoprazole (PROTONIX) EC tablet 40 mg  40 mg Oral Daily Chauncey Mann, MD   40 mg at 11/04/16 0846  . pseudoephedrine (SUDAFED) tablet 30 mg  30 mg Oral Q8H PRN Rainey Pines, MD   30 mg at 11/03/16 2210  . traZODone (DESYREL) tablet 50 mg  50 mg Oral QHS PRN Hildred Priest, MD   50 mg at 11/03/16 2210   PTA Medications: Prescriptions Prior to Admission  Medication Sig Dispense Refill Last Dose  . albuterol (PROVENTIL HFA;VENTOLIN HFA) 108 (90 BASE) MCG/ACT inhaler Inhale 1-2 puffs into the lungs every 6 (six) hours as needed for wheezing or shortness of breath. 1 Inhaler 0 prn at prn  . amitriptyline (ELAVIL) 25 MG tablet Take 2 tablets (50 mg total) by mouth at bedtime. 30 tablet 0 10/27/2016 at pm  . beclomethasone (QVAR) 40 MCG/ACT inhaler Inhale 2 puffs into the lungs 2 (two) times daily.   unknown at unknown  . chlorzoxazone (PARAFON) 500 MG tablet Take by mouth 3 (three) times daily as needed for muscle spasms.   prn at prn  . fentaNYL (  DURAGESIC - DOSED MCG/HR) 12 MCG/HR Place 1 patch (12.5 mcg total) onto the skin every 3 (three) days. 5 patch 0 unknown at unknown  . gabapentin (NEURONTIN) 100 MG capsule Take 2 capsules (200 mg total) by mouth 3 (three) times daily. 90 capsule 0 10/27/2016 at pm  . liothyronine (CYTOMEL) 25 MCG tablet Take 25 mcg by mouth daily.   2 10/27/2016 at am  . metoprolol succinate (TOPROL-XL) 50 MG 24 hr tablet Take 50 mg by mouth daily. Reported on 04/16/2016  5 10/27/2016 at 0730  . pantoprazole (PROTONIX) 40 MG tablet Take 40 mg by mouth daily.   unknown at unknown  . SYNTHROID 75 MCG tablet TAKE 1 TABLET BY MOUTH EVERY  DAY 90 tablet 0 10/27/2016 at am    Patient Stressors: Educational concerns Health problems  Patient Strengths: Ability for insight Capable of independent living Communication skills Supportive family/friends  Treatment Modalities: Medication Management, Group therapy, Case management,  1 to 1 session with clinician, Psychoeducation, Recreational therapy.   Physician Treatment Plan for Primary Diagnosis: Major depressive disorder, recurrent, severe with psychotic features ([place]) Long Term Goal(s): Improvement in symptoms so as ready for discharge NA   Short Term Goals: Ability to identify changes in lifestyle to reduce recurrence of condition will improve Ability to verbalize feelings will improve Ability to disclose and discuss suicidal ideas Ability to demonstrate self-control will improve Ability to identify and develop effective coping behaviors will improve Ability to maintain clinical measurements within normal limits will improve Compliance with prescribed medications will improve NA  Medication Management: Evaluate patient's response, side effects, and tolerance of medication regimen.  Therapeutic Interventions: 1 to 1 sessions, Unit Group sessions and Medication administration.  Evaluation of Outcomes: Adequate for Discharge  Physician Treatment Plan for Secondary Diagnosis: Principal Problem:   Major depressive disorder, recurrent, severe with psychotic features ([place]) Active Problems:   Post traumatic stress disorder (PTSD)   Fibromyalgia   HTN (hypertension)   COPD (chronic obstructive pulmonary disease) ([place])  Long Term Goal(s): Improvement in symptoms so as ready for discharge NA   Short Term Goals: Ability to identify changes in lifestyle to reduce recurrence of condition will improve Ability to verbalize feelings will improve Ability to disclose and discuss suicidal ideas Ability to demonstrate self-control will improve Ability to identify and develop  effective coping behaviors will improve Ability to maintain clinical measurements within normal limits will improve Compliance with prescribed medications will improve NA     Medication Management: Evaluate patient's response, side effects, and tolerance of medication regimen.  Therapeutic Interventions: 1 to 1 sessions, Unit Group sessions and Medication administration.  Evaluation of Outcomes: Adequate for Discharge   RN Treatment Plan for Primary Diagnosis: Major depressive disorder, recurrent, severe with psychotic features ([place]) Long Term Goal(s): Knowledge of disease and therapeutic regimen to maintain health will improve  Short Term Goals: Ability to remain free from injury will improve, Ability to verbalize frustration and anger appropriately will improve, Ability to demonstrate self-control, Ability to verbalize feelings will improve, Ability to identify and develop effective coping behaviors will improve and Compliance with prescribed medications will improve  Medication Management: RN will administer medications as ordered by provider, will assess and evaluate patient's response and provide education to patient for prescribed medication. RN will report any adverse and/or side effects to prescribing provider.  Therapeutic Interventions: 1 on 1 counseling sessions, Psychoeducation, Medication administration, Evaluate responses to treatment, Monitor vital signs and CBGs as ordered, Perform/monitor CIWA, COWS, AIMS and Fall  Risk screenings as ordered, Perform wound care treatments as ordered.  Evaluation of Outcomes: Adequate for Discharge   LCSW Treatment Plan for Primary Diagnosis: Major depressive disorder, recurrent, severe with psychotic features ([place]) Long Term Goal(s): Safe transition to appropriate next level of care at discharge, Engage patient in therapeutic group addressing interpersonal concerns.  Short Term Goals: Engage patient in aftercare planning with referrals and  resources, Increase social support, Increase ability to appropriately verbalize feelings, Increase emotional regulation and Facilitate acceptance of mental health diagnosis and concerns  Therapeutic Interventions: Assess for all discharge needs, 1 to 1 time with Social worker, Explore available resources and support systems, Assess for adequacy in community support network, Educate family and significant other(s) on suicide prevention, Complete Psychosocial Assessment, Interpersonal group therapy.  Evaluation of Outcomes: Adequate for Discharge   Progress in Treatment: Attending groups: Yes. Participating in groups: Yes. Taking medication as prescribed: Yes. Toleration medication: Yes. Family/Significant other contact made: No, will contact:  pt's parents Patient understands diagnosis: Yes. Discussing patient identified problems/goals with staff: Yes. Medical problems stabilized or resolved: Yes. Denies suicidal/homicidal ideation: Yes. Issues/concerns per patient self-inventory: Yes. Other: none listed  New problem(s) identified: No, Describe:  none listed  New Short Term/Long Term Goal(s):  Discharge Plan or Barriers: Pt will Pt will discharge to [facility] to [facility] at the [facility] and will follow up with [facility] for medication management and therapy.    Reason for Continuation of Hospitalization: Anxiety Depression Hallucinations Medical Issues Medication stabilization  Estimated Length of Stay: Date of discharge: 11/04/16   Attendees: Patient: Kristina Li 11/04/2016 10:56 AM  Physician: Dr. Bary Leriche, MD 11/04/2016 10:56 AM  Nursing: Polly Cobia, RN 11/04/2016 10:56 AM  RN Care Manager: 11/04/2016 10:56 AM  Social Worker: Alphonse Guild. Daine Gravel, LCAS 11/04/2016 10:56 AM  Recreational Therapist: Everitt Amber, LRT 11/04/2016 10:56 AM  Other:  11/04/2016 10:56 AM  Other:  11/04/2016 10:56 AM  Other: 11/04/2016 10:56 AM     Scribe for Treatment Team: Alphonse Guild Jatoria Kneeland, LCSWA 11/04/2016

## 2016-11-04 NOTE — NC FL2 (Signed)
[place] LEVEL OF CARE SCREENING TOOL     IDENTIFICATION  Patient Name: Kristina Li Birthdate: 1981-04-27 Sex: female Admission Date (Current Location): 10/28/2016  [place] and [place] Number:  Selena Lesser [id number] [place] and Address:  [facility], [address], [place], [place] [address]      Provider Number: [id number]  Attending Physician Name and Address:  Clovis Fredrickson, MD  Relative Name and Phone Number:       Current Level of Care: Hospital Recommended Level of Care: Other (Comment) (Transition to Independent Living home) Prior Approval Number:    Date Approved/Denied:   PASRR Number:    Discharge Plan:  (Independent Living)    Current Diagnoses: Patient Active Problem List   Diagnosis Date Noted  . Major depressive disorder, recurrent, severe with psychotic features ([place]) 08/15/2016  . HTN (hypertension) 08/15/2016  . COPD (chronic obstructive pulmonary disease) ([place]) 08/15/2016  . 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 DDD (degenerative disc disease), lumbar 03/18/2016  . Facet syndrome, lumbar 03/18/2016  . Lumbar radiculopathy 03/18/2016  . DJD of shoulder 03/05/2016  . Cervicalgia 01/31/2016  . Sacroiliac joint dysfunction 01/31/2016  . Myofascial pain 01/31/2016  . Fibromyalgia 01/31/2016  . Bilateral occipital neuralgia 01/31/2016  . Thyroid cancer ([place])   . Post traumatic stress disorder (PTSD)   . Gastroesophageal reflux disease   . Endometriosis     Orientation RESPIRATION BLADDER Height & Weight     Self, Time, Situation, Place  Normal Continent Weight: 165 lb (74.8 kg) Height:  5' 6.5" (168.9 cm)  BEHAVIORAL SYMPTOMS/MOOD NEUROLOGICAL BOWEL NUTRITION STATUS      Continent    AMBULATORY STATUS COMMUNICATION OF NEEDS Skin   Independent Verbally Normal                       Personal Care Assistance Level of Assistance              Functional Limitations Info             SPECIAL CARE FACTORS FREQUENCY                       Contractures Contractures Info: Not present    Additional Factors Info                  Current Medications (11/04/2016):  This is the current hospital active medication list Current Facility-Administered Medications  Medication Dose Route Frequency Provider Last Rate Last Dose  . acetaminophen (TYLENOL) tablet 650 mg  650 mg Oral Q6H PRN Hildred Priest, MD   650 mg at 11/02/16 1131  . albuterol (PROVENTIL HFA;VENTOLIN HFA) 108 (90 Base) MCG/ACT inhaler 1-2 puff  1-2 puff Inhalation Q6H PRN Hildred Priest, MD   2 puff at 11/03/16 0859  . alum & mag hydroxide-simeth (MAALOX/MYLANTA) 200-200-20 MG/5ML suspension 30 mL  30 mL Oral Q4H PRN Hildred Priest, MD   30 mL at 11/03/16 1745  . amitriptyline (ELAVIL) tablet 50 mg  50 mg Oral QHS Hildred Priest, MD   50 mg at 11/03/16 2210  . ARIPiprazole (ABILIFY) tablet 10 mg  10 mg Oral Daily Chauncey Mann, MD   10 mg at 11/04/16 0846  . ARIPiprazole ER SRER 400 mg  400 mg Intramuscular Q28 days [name] [name], MD      . cyclobenzaprine (FLEXERIL) tablet 5 mg  5 mg Oral TID PRN Clovis Fredrickson, MD   5 mg at  11/04/16 1258  . gabapentin (NEURONTIN) capsule 200 mg  200 mg Oral TID Hildred Priest, MD   200 mg at 11/04/16 1256  . guaiFENesin (MUCINEX) 12 hr tablet 600 mg  600 mg Oral BID PRN Chauncey Mann, MD   600 mg at 11/03/16 1745  . hydrOXYzine (ATARAX/VISTARIL) tablet 10 mg  10 mg Oral TID PRN Rainey Pines, MD   10 mg at 11/04/16 0640  . levothyroxine (SYNTHROID, LEVOTHROID) tablet 75 mcg  75 mcg Oral QAC breakfast Hildred Priest, MD   75 mcg at 11/04/16 [phone]  . liothyronine (CYTOMEL) tablet 25 mcg  25 mcg Oral QAC breakfast Hildred Priest, MD   25 mcg at 11/04/16 [phone]  . magnesium hydroxide (MILK OF MAGNESIA) suspension 30 mL  30 mL Oral Daily PRN Hildred Priest, MD   30 mL at  11/03/16 1435  . metoprolol succinate (TOPROL-XL) 24 hr tablet 50 mg  50 mg Oral Daily Hildred Priest, MD   50 mg at 11/04/16 0846  . nicotine (NICODERM CQ - dosed in mg/24 hours) patch 21 mg  21 mg Transdermal Daily PRN Hildred Priest, MD      . pantoprazole (PROTONIX) EC tablet 40 mg  40 mg Oral Daily Chauncey Mann, MD   40 mg at 11/04/16 0846  . polyethylene glycol (MIRALAX / GLYCOLAX) packet 17 g  17 g Oral Daily [name] [name], MD      . pseudoephedrine (SUDAFED) tablet 30 mg  30 mg Oral Q8H PRN Rainey Pines, MD   30 mg at 11/03/16 2210  . traZODone (DESYREL) tablet 50 mg  50 mg Oral QHS PRN Hildred Priest, MD   50 mg at 11/03/16 2210     Discharge Medications: Please see discharge summary for a list of discharge medications.  Relevant Imaging Results:  Relevant Lab Results:   Additional Information    Alphonse Guild Caryl Manas, LCSWA

## 2016-11-04 NOTE — Progress Notes (Signed)
Pleasant and cooperative.  Denies SI/HI/AVH.  Rates depression as a 3.  Verbalizes that she has some anxiety.   Discharge instructions given, verbalized understanding.  Prescriptions given and personal belongings returned.  Escorted off unit by staff to meet Kendra Opitz to travel to transitional housing.

## 2016-11-04 NOTE — BHH Group Notes (Signed)
[facility] Group Notes:  (Nursing/MHT/Case Management/Adjunct)  Date:  11/04/2016  Time:  1:55 PM  Type of Therapy:  Psychoeducational Skills  Participation Level:  Active  Participation Quality:  Appropriate, Attentive and Supportive  Affect:  Appropriate  Cognitive:  Appropriate  Insight:  Appropriate  Engagement in Group:  Engaged and Supportive  Modes of Intervention:  Discussion and Education  Summary of Progress/Problems:  Kristina Li 11/04/2016, 1:55 PM
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
## 2016-11-04 NOTE — BHH Suicide Risk Assessment (Signed)
[facility] Discharge Suicide Risk Assessment   Principal Problem: Major depressive disorder, recurrent, severe with psychotic features [facility]) Discharge Diagnoses:  Patient Active Problem List   Diagnosis Date Noted  . Major depressive disorder, recurrent, severe with psychotic features ([place]) [F33.3] 08/15/2016  . HTN (hypertension) [I10] 08/15/2016  . COPD (chronic obstructive pulmonary disease) ([place]) [J44.9] 08/15/2016  . 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 DDD (degenerative disc disease), lumbar [M51.36] 03/18/2016  . Facet syndrome, lumbar [M12.88] 03/18/2016  . Lumbar radiculopathy [M54.16] 03/18/2016  . 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 Cervicalgia [M54.2] 01/31/2016  . Sacroiliac joint dysfunction [M53.3] 01/31/2016  . Myofascial pain [M79.1] 01/31/2016  . Fibromyalgia [M79.7] 01/31/2016  . Bilateral occipital neuralgia [M54.81] 01/31/2016  . Thyroid cancer ([place]) [C73]   . Post traumatic stress disorder (PTSD) [F43.10]   . Gastroesophageal reflux disease [K21.9]   . Endometriosis [N80.9]     Total Time spent with patient: 30 minutes  Musculoskeletal: Strength & Muscle Tone: within normal limits Gait & Station: normal Patient leans: N/A  Psychiatric Specialty Exam: Review of Systems  Psychiatric/Behavioral: Positive for depression.  All other systems reviewed and are negative.   Blood pressure (!) 120/94, pulse 93, temperature 98.9 F (37.2 C), temperature source Oral, resp. rate 18, height 5' 6.5" (1.689 m), weight 74.8 kg (165 lb), last menstrual period 10/27/2016, SpO2 100 %.Body mass index is 26.23 kg/m.  General Appearance: Casual  Eye Contact::  Good  Speech:  Clear and Coherent409  Volume:  Normal  Mood:  Euthymic  Affect:  Appropriate  Thought Process:  Goal Directed and Descriptions of Associations: Intact  Orientation:  Full (Time, Place, and Person)  Thought Content:  WDL  Suicidal Thoughts:  No  Homicidal Thoughts:  No  Memory:  Immediate;    Fair Recent;   Fair Remote;   Fair  Judgement:  Fair  Insight:  Fair  Psychomotor Activity:  Normal  Concentration:  Fair  Recall:  [employer] of [place]  Language: Fair  Akathisia:  No  Handed:  Right  AIMS (if indicated):     Assets:  Communication Skills Desire for Improvement Financial Resources/Insurance Housing Physical Health Resilience Social Support  Sleep:  Number of Hours: 4.75  Cognition: WNL  ADL's:  Intact   Mental Status Per Nursing Assessment::   On Admission:     Demographic Factors:  NA  Loss Factors: Loss of significant relationship  Historical Factors: Prior suicide attempts, Family history of mental illness or substance abuse and Impulsivity  Risk Reduction Factors:   Sense of responsibility to family, Living with another person, especially a relative and Positive social support  Continued Clinical Symptoms:  Depression:   Insomnia Chronic Pain Medical Diagnoses and Treatments/Surgeries  Cognitive Features That Contribute To Risk:  None    Suicide Risk:  Minimal: No identifiable suicidal ideation.  Patients presenting with no risk factors but with morbid ruminations; may be classified as minimal risk based on the severity of the depressive symptoms  Follow-up Information    [employer] ACT Team CSW NEEDED NOT COMPLETE SEE CSW Follow up.   Why:  You have been referred to [facility] for a higher level of care and you will be contacted by them after discharge to assess you forv medication managment and therapy Contact information: [facility] Team [address] [place], [place] [address] Ph: [phone] Fax: [place] Follow up.   Why:  Please arrive to the walk-in clinic between the hours of 8am-2:30pm for an assessment and hospital follow up for medication management and therapy.  Please call Sherrian Divers at [phone] for questions and assistance. Contact  information: [place] of [place] [address] Ph: [phone] Fax: [phone]             Plan Of Care/Follow-up recommendations:  Activity:  as tolerated. Diet:  regular. Other:  keep follow up appointments.  Orson Slick, MD 11/04/2016, 12:04 PM

## 2016-11-04 NOTE — Plan of Care (Signed)
Problem: Coping: Goal: Ability to verbalize feelings will improve Outcome: Progressing Patient verbalized feeling to staff.    

## 2016-11-04 NOTE — Progress Notes (Signed)
Recreation Therapy Notes  Date: 11.28.17 Time: 9:30 am Location: Craft Room  Group Topic: Self-expression  Goal Area(s) Addresses:  Patient will identify one color per emotion listed on the wheel. Patient will verbalize one emotion experienced during session. Patient will be educated on other forms of self-expression  Behavioral Response: Arrived late, Attentive, Interactive  Intervention: Emotion Wheel  Activity: Patients were given an Licensed conveyancer and were instructed to pick a color for each emotion listed on the wheel.  Education: LRT educated patients on different forms of self-expression.  Education Outcome: Acknowledges education/In group clarification offered  Clinical Observations/Feedback: Patient arrived to group at approximately 10:07 am. LRT explained activity. Patient picked a color for each emotion. Patient contributed to group discussion by stating certain colors she picked for certain emotions.  Leonette Monarch, LRT/CTRS 11/04/2016 10:27 AM

## 2016-11-04 NOTE — Progress Notes (Signed)
  [facility] Adult Case Management Discharge Plan :  Will you be returning to the same living situation after discharge:  No. Pt will discharge to [facility] to be admitted to the [facility] Transition to [place] home  At discharge, do you have transportation home?: Yes,  pt will be picked up by her home Do you have the ability to pay for your medications: Yes,  pt will be provided with prescriptions at discharge  Release of information consent forms completed and in the chart;  Patient's signature needed at discharge.  Patient to Follow up at: Follow-up Information    [facility] Follow up.   Why:  Please arrive to the walk-in clinic Monday through Friday from 9-4pm for your assessment for your hospital follow up for medication management and therapy. Contact information: Lethea Killings [address] [place], [place] [address] Phone: [phone] Fax: [phone]       [facility] [place] Follow up.   Why:  Please arrivre on November 28th, 2017 to be admitted into the Transition to [facility] for long-term assistance with your needs, doctor's appointments and medication requirements Contact information: [address] [place], [place] [address] Ph: [phone] Fax: Fax Not Needed          Next level of care provider has access to [place] and Suicide Prevention discussed: Yes,  completed with pt  Have you used any form of tobacco in the last 30 days? (Cigarettes, Smokeless Tobacco, Cigars, and/or Pipes): No  Has patient been referred to the Quitline?: N/A patient is not a smoker  Patient has been referred for addiction treatment: N/A  Alphonse Guild Doreene Forrey 11/04/2016, 1:24 PM

## 2016-11-04 NOTE — Progress Notes (Signed)
D: Pt denies SI/HI/AVH, but noted responding to internal stimuli. Pt is cooperative, affect is flat and sad but brightens upon approach. Patient appears paranoid, often reassured and redirected. Patient appears anxious and somatic with multiple request at several times. Patient is not interacting with peers and staff appropriately.  A: Pt was offered support and encouragement. Pt was given scheduled medications. Pt was encouraged to attend groups. Q 15 minute checks were done for safety.  R:Pt did not attend group. Pt is taking medication. Pt receptive to treatment and safety maintained on unit.

## 2016-12-16 ENCOUNTER — Other Ambulatory Visit: Payer: Self-pay | Admitting: Pain Medicine

## 2017-01-07 ENCOUNTER — Emergency Department
Admission: EM | Admit: 2017-01-07 | Discharge: 2017-01-07 | Disposition: A | Discharge status: Home / Self Care | Payer: Medicaid Other | Attending: Emergency Medicine | Admitting: Emergency Medicine

## 2017-01-07 DIAGNOSIS — Z5321 Procedure and treatment not carried out due to patient leaving prior to being seen by health care provider: Secondary | ICD-10-CM | POA: Insufficient documentation

## 2017-01-07 DIAGNOSIS — Z79899 Other long term (current) drug therapy: Secondary | ICD-10-CM | POA: Insufficient documentation

## 2017-01-07 DIAGNOSIS — E039 Hypothyroidism, unspecified: Secondary | ICD-10-CM | POA: Insufficient documentation

## 2017-01-07 DIAGNOSIS — J45909 Unspecified asthma, uncomplicated: Secondary | ICD-10-CM | POA: Diagnosis not present

## 2017-01-07 DIAGNOSIS — R51 Headache: Secondary | ICD-10-CM | POA: Diagnosis present

## 2017-01-07 NOTE — ED Notes (Signed)
Called for c-pod room x3

## 2017-01-07 NOTE — ED Triage Notes (Signed)
Headache to bilateral temples that began yesterday, got better then began again 2 hours ago. Photosensitivity. Pt alert and oriented X4, active, cooperative, pt in NAD. RR even and unlabored, color WNL.

## 2017-01-07 NOTE — ED Notes (Signed)
Pt called in lobby, no response 

## 2017-01-13 ENCOUNTER — Other Ambulatory Visit: Payer: Self-pay | Admitting: Pain Medicine

## 2017-01-17 ENCOUNTER — Emergency Department
Admission: EM | Admit: 2017-01-17 | Discharge: 2017-01-17 | Disposition: A | Discharge status: Home / Self Care | Payer: Medicaid Other | Attending: Emergency Medicine | Admitting: Emergency Medicine

## 2017-01-17 ENCOUNTER — Encounter: Payer: Self-pay | Admitting: Emergency Medicine

## 2017-01-17 DIAGNOSIS — R253 Fasciculation: Secondary | ICD-10-CM | POA: Diagnosis present

## 2017-01-17 DIAGNOSIS — Z79899 Other long term (current) drug therapy: Secondary | ICD-10-CM | POA: Insufficient documentation

## 2017-01-17 DIAGNOSIS — J45909 Unspecified asthma, uncomplicated: Secondary | ICD-10-CM | POA: Insufficient documentation

## 2017-01-17 DIAGNOSIS — E039 Hypothyroidism, unspecified: Secondary | ICD-10-CM | POA: Diagnosis not present

## 2017-01-17 DIAGNOSIS — I10 Essential (primary) hypertension: Secondary | ICD-10-CM | POA: Diagnosis not present

## 2017-01-17 LAB — BASIC METABOLIC PANEL
Anion gap: 5 (ref 5–15)
BUN: 13 mg/dL (ref 6–20)
CHLORIDE: 105 mmol/L (ref 101–111)
CO2: 28 mmol/L (ref 22–32)
CREATININE: 0.81 mg/dL (ref 0.44–1.00)
Calcium: 8.8 mg/dL — ABNORMAL LOW (ref 8.9–10.3)
GFR calc Af Amer: >60 mL/min (ref >60)
GFR calc non Af Amer: >60 mL/min (ref >60)
Glucose, Bld: 89 mg/dL (ref 65–99)
POTASSIUM: 3.8 mmol/L (ref 3.5–5.1)
SODIUM: 138 mmol/L (ref 135–145)

## 2017-01-17 LAB — TSH: TSH: 3.484 u[IU]/mL (ref 0.350–4.500)

## 2017-01-17 LAB — MAGNESIUM: Magnesium: 2.1 mg/dL (ref 1.7–2.4)

## 2017-01-17 NOTE — Discharge Instructions (Signed)
Return to the emergency room if you have any new or worrisome symptoms. Do not drink caffeine. Follow closely with your  primary care doctor.

## 2017-01-17 NOTE — ED Triage Notes (Signed)
Pt to ed with c/o left eye twitching, abd pain and twitching, and also twitching in all 4 extremities.
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
## 2017-01-19 LAB — POCT PREGNANCY, URINE: Preg Test, Ur: NEGATIVE

## 2017-01-22 ENCOUNTER — Emergency Department
Admission: EM | Admit: 2017-01-22 | Discharge: 2017-01-22 | Disposition: A | Discharge status: Home / Self Care | Payer: Medicaid Other | Attending: Emergency Medicine | Admitting: Emergency Medicine

## 2017-01-22 ENCOUNTER — Encounter: Payer: Self-pay | Admitting: Emergency Medicine

## 2017-01-22 DIAGNOSIS — J449 Chronic obstructive pulmonary disease, unspecified: Secondary | ICD-10-CM | POA: Insufficient documentation

## 2017-01-22 DIAGNOSIS — E039 Hypothyroidism, unspecified: Secondary | ICD-10-CM | POA: Diagnosis not present

## 2017-01-22 DIAGNOSIS — R1030 Lower abdominal pain, unspecified: Secondary | ICD-10-CM | POA: Insufficient documentation

## 2017-01-22 DIAGNOSIS — I10 Essential (primary) hypertension: Secondary | ICD-10-CM | POA: Insufficient documentation

## 2017-01-22 DIAGNOSIS — Z79899 Other long term (current) drug therapy: Secondary | ICD-10-CM | POA: Insufficient documentation

## 2017-01-22 DIAGNOSIS — J45909 Unspecified asthma, uncomplicated: Secondary | ICD-10-CM | POA: Diagnosis not present

## 2017-01-22 LAB — CBC
HCT: 34.9 % — ABNORMAL LOW (ref 35.0–47.0)
Hemoglobin: 11.1 g/dL — ABNORMAL LOW (ref 12.0–16.0)
MCH: 26 pg (ref 26.0–34.0)
MCHC: 31.8 g/dL — ABNORMAL LOW (ref 32.0–36.0)
MCV: 81.8 fL (ref 80.0–100.0)
PLATELETS: 252 10*3/uL (ref 150–440)
RBC: 4.27 MIL/uL (ref 3.80–5.20)
RDW: 15.5 % — AB (ref 11.5–14.5)
WBC: 6.1 10*3/uL (ref 3.6–11.0)

## 2017-01-22 LAB — COMPREHENSIVE METABOLIC PANEL
ALT: 15 U/L (ref 14–54)
AST: 18 U/L (ref 15–41)
Albumin: 4.1 g/dL (ref 3.5–5.0)
Alkaline Phosphatase: 56 U/L (ref 38–126)
Anion gap: 5 (ref 5–15)
BILIRUBIN TOTAL: 0.2 mg/dL — AB (ref 0.3–1.2)
BUN: 13 mg/dL (ref 6–20)
CHLORIDE: 104 mmol/L (ref 101–111)
CO2: 30 mmol/L (ref 22–32)
CREATININE: 0.93 mg/dL (ref 0.44–1.00)
Calcium: 9 mg/dL (ref 8.9–10.3)
Glucose, Bld: 83 mg/dL (ref 65–99)
POTASSIUM: 4.2 mmol/L (ref 3.5–5.1)
Sodium: 139 mmol/L (ref 135–145)
TOTAL PROTEIN: 7.6 g/dL (ref 6.5–8.1)

## 2017-01-22 LAB — URINALYSIS, COMPLETE (UACMP) WITH MICROSCOPIC
Bacteria, UA: NONE SEEN
Bilirubin Urine: NEGATIVE
GLUCOSE, UA: NEGATIVE mg/dL
HGB URINE DIPSTICK: NEGATIVE
KETONES UR: NEGATIVE mg/dL
NITRITE: NEGATIVE
PH: 6 (ref 5.0–8.0)
Protein, ur: NEGATIVE mg/dL
SPECIFIC GRAVITY, URINE: 1.02 (ref 1.005–1.030)

## 2017-01-22 LAB — LIPASE, BLOOD: LIPASE: 25 U/L (ref 11–51)

## 2017-01-22 MED ORDER — DICYCLOMINE HCL 20 MG PO TABS: 20.0000 mg | ORAL_TABLET | Freq: Three times a day (TID) | ORAL | 0 refills | Status: DC | PRN
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
## 2017-01-22 NOTE — Discharge Instructions (Signed)
Please seek medical attention for any high fevers, chest pain, shortness of breath, change in behavior, persistent vomiting, bloody stool or any other new or concerning symptoms.  

## 2017-01-22 NOTE — ED Notes (Signed)
Patient presents to the ED with lower abdominal, "pinching" in abdomen, pelvic, and rectal area.  Patient reports last BM was two days ago.  Patient states, "that is normal for me."  Patient reports a recent period as well as celibacy x 2 years.  Patient denies history of similar pain.  Patient has no obvious distress at this time.  Denies vomiting, fever, and diarrhea.  Patient states she has had less of an appetite and decreased energy level over the past 2 days.

## 2017-01-22 NOTE — ED Triage Notes (Signed)
Pt with abd pain started last week. Pt denies any vaginal bleeding and or discharge.

## 2017-01-23 DIAGNOSIS — R1030 Lower abdominal pain, unspecified: Secondary | ICD-10-CM | POA: Diagnosis present

## 2017-01-23 DIAGNOSIS — E039 Hypothyroidism, unspecified: Secondary | ICD-10-CM | POA: Insufficient documentation

## 2017-01-23 DIAGNOSIS — Z85850 Personal history of malignant neoplasm of thyroid: Secondary | ICD-10-CM | POA: Diagnosis not present

## 2017-01-23 DIAGNOSIS — J45909 Unspecified asthma, uncomplicated: Secondary | ICD-10-CM | POA: Insufficient documentation

## 2017-01-23 DIAGNOSIS — J449 Chronic obstructive pulmonary disease, unspecified: Secondary | ICD-10-CM | POA: Insufficient documentation

## 2017-01-23 DIAGNOSIS — K5900 Constipation, unspecified: Secondary | ICD-10-CM | POA: Diagnosis not present

## 2017-01-24 ENCOUNTER — Emergency Department
Admission: EM | Admit: 2017-01-24 | Discharge: 2017-01-24 | Disposition: A | Discharge status: Home / Self Care | Payer: Medicaid Other | Attending: Emergency Medicine | Admitting: Emergency Medicine

## 2017-01-24 ENCOUNTER — Encounter: Payer: Self-pay | Admitting: Emergency Medicine

## 2017-01-24 ENCOUNTER — Emergency Department: Payer: Medicaid Other

## 2017-01-24 DIAGNOSIS — K5900 Constipation, unspecified: Secondary | ICD-10-CM

## 2017-01-24 DIAGNOSIS — R1030 Lower abdominal pain, unspecified: Secondary | ICD-10-CM

## 2017-01-24 LAB — COMPREHENSIVE METABOLIC PANEL
ALK PHOS: 57 U/L (ref 38–126)
ALT: 18 U/L (ref 14–54)
AST: 22 U/L (ref 15–41)
Albumin: 4.2 g/dL (ref 3.5–5.0)
Anion gap: 4 — ABNORMAL LOW (ref 5–15)
BUN: 10 mg/dL (ref 6–20)
CALCIUM: 8.8 mg/dL — AB (ref 8.9–10.3)
CHLORIDE: 105 mmol/L (ref 101–111)
CO2: 28 mmol/L (ref 22–32)
CREATININE: 1.01 mg/dL — AB (ref 0.44–1.00)
GFR calc non Af Amer: >60 mL/min (ref >60)
GLUCOSE: 98 mg/dL (ref 65–99)
Potassium: 3.4 mmol/L — ABNORMAL LOW (ref 3.5–5.1)
SODIUM: 137 mmol/L (ref 135–145)
Total Bilirubin: 0.5 mg/dL (ref 0.3–1.2)
Total Protein: 7.6 g/dL (ref 6.5–8.1)

## 2017-01-24 LAB — CBC
HCT: 34.6 % — ABNORMAL LOW (ref 35.0–47.0)
Hemoglobin: 11.2 g/dL — ABNORMAL LOW (ref 12.0–16.0)
MCH: 26.4 pg (ref 26.0–34.0)
MCHC: 32.3 g/dL (ref 32.0–36.0)
MCV: 81.8 fL (ref 80.0–100.0)
PLATELETS: 240 10*3/uL (ref 150–440)
RBC: 4.22 MIL/uL (ref 3.80–5.20)
RDW: 15.2 % — AB (ref 11.5–14.5)
WBC: 5.5 10*3/uL (ref 3.6–11.0)

## 2017-01-24 LAB — URINALYSIS, COMPLETE (UACMP) WITH MICROSCOPIC
Bacteria, UA: NONE SEEN
Bilirubin Urine: NEGATIVE
Glucose, UA: NEGATIVE mg/dL
Hgb urine dipstick: NEGATIVE
Ketones, ur: NEGATIVE mg/dL
Leukocytes, UA: NEGATIVE
Nitrite: NEGATIVE
PH: 6 (ref 5.0–8.0)
Protein, ur: NEGATIVE mg/dL
SPECIFIC GRAVITY, URINE: 1.012 (ref 1.005–1.030)

## 2017-01-24 LAB — LIPASE, BLOOD: Lipase: 27 U/L (ref 11–51)

## 2017-01-24 LAB — POCT PREGNANCY, URINE: PREG TEST UR: NEGATIVE

## 2017-01-24 MED ORDER — IOPAMIDOL (ISOVUE-300) INJECTION 61%
100.0000 mL | Freq: Once | INTRAVENOUS | Status: AC | PRN
  Administered 2017-01-24: 100 mL via INTRAVENOUS

## 2017-01-24 MED ORDER — IOPAMIDOL (ISOVUE-300) INJECTION 61%
30.0000 mL | Freq: Once | INTRAVENOUS | Status: AC | PRN
  Administered 2017-01-24: 30 mL via ORAL

## 2017-01-24 NOTE — ED Triage Notes (Signed)
Pt ambulatory to triage in NAD, accompanied by EMS, report lower abd pain x 1 week.  Pt reports some constipation, LBM 2 days ago and was normal.  Report pain is pinching. Denies urinary sx

## 2017-01-24 NOTE — Discharge Instructions (Addendum)
You have been seen in the Emergency Department (ED) for abdominal pain.  Your evaluation did not identify a clear cause of your symptoms but was generally reassuring.  We believe your discomfort is caused by constipation.  We recommend that you use one or more of the following over-the-counter medications in the order described:   1)  Colace (or Dulcolax) 100 mg:  This is a stool softener, and you may take it once or twice a day as needed. 2)  Senna tablets:  This is a bowel stimulant that will help "push" out your stool. It is the next step to add after you have tried a stool softener. 3)  Miralax (powder):  This medication works by drawing additional fluid into your intestines and helps to flush out your stool.  Mix the powder with water or juice according to label instructions.  It may help if the Colace and Senna are not sufficient, but you must be sure to use the recommended amount of water or juice when you mix up the powder. Remember that narcotic pain medications are constipating, so avoid them or minimize their use.  Drink plenty of fluids.  Please return to the Emergency Department immediately if you develop new or worsening symptoms that concern you, such as (but not limited to) fever > 101 degrees, severe abdominal pain, or persistent vomiting.

## 2017-01-24 NOTE — ED Notes (Signed)
Pt presents to ED 60 c/o "pinching" to lower abdomen and the rectal area; pt states pain started "about a week ago"; pt states last bowel movement was about 4 days ago; pt has been passing gas; and does not report any problem with urinating; pt denies any nausea or vomiting; pt's abdomen is soft and tender and audible in all quadrants; pt is awake and alert x4, speaking in complete sentences.
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
## 2017-01-24 NOTE — ED Notes (Signed)
Report from kenny, rn.  

## 2017-01-26 ENCOUNTER — Other Ambulatory Visit: Payer: Self-pay | Admitting: *Deleted

## 2017-01-27 ENCOUNTER — Encounter: Payer: Self-pay | Admitting: Gastroenterology

## 2017-01-27 ENCOUNTER — Other Ambulatory Visit: Payer: Self-pay

## 2017-01-27 ENCOUNTER — Ambulatory Visit (INDEPENDENT_AMBULATORY_CARE_PROVIDER_SITE_OTHER): Payer: Medicaid Other | Admitting: Gastroenterology

## 2017-01-27 VITALS — BP 118/76 | HR 102 | Ht 66.5 in | Wt 177.0 lb

## 2017-01-27 DIAGNOSIS — R194 Change in bowel habit: Secondary | ICD-10-CM | POA: Diagnosis not present

## 2017-01-27 DIAGNOSIS — K625 Hemorrhage of anus and rectum: Secondary | ICD-10-CM
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
## 2017-01-27 NOTE — Patient Instructions (Addendum)
Constipation, Adult °Constipation is when a person: °· Poops (has a bowel movement) fewer times in a week than normal. °· Has a hard time pooping. °· Has poop that is dry, hard, or bigger than normal. ° °Follow these instructions at home: °Eating and drinking ° °· Eat foods that have a lot of fiber, such as: °? Fresh fruits and vegetables. °? Whole grains. °? Beans. °· Eat less of foods that are high in fat, low in fiber, or overly processed, such as: °? French fries. °? Hamburgers. °? Cookies. °? Candy. °? Soda. °· Drink enough fluid to keep your pee (urine) clear or pale yellow. °General instructions °· Exercise regularly or as told by your doctor. °· Go to the restroom when you feel like you need to poop. Do not hold it in. °· Take over-the-counter and prescription medicines only as told by your doctor. These include any fiber supplements. °· Do pelvic floor retraining exercises, such as: °? Doing deep breathing while relaxing your lower belly (abdomen). °? Relaxing your pelvic floor while pooping. °· Watch your condition for any changes. °· Keep all follow-up visits as told by your doctor. This is important. °Contact a doctor if: °· You have pain that gets worse. °· You have a fever. °· You have not pooped for 4 days. °· You throw up (vomit). °· You are not hungry. °· You lose weight. °· You are bleeding from the anus. °· You have thin, pencil-like poop (stool). °Get help right away if: °· You have a fever, and your symptoms suddenly get worse. °· You leak poop or have blood in your poop. °· Your belly feels hard or bigger than normal (is bloated). °· You have very bad belly pain. °· You feel dizzy or you faint. °This information is not intended to replace advice given to you by your health care provider. Make sure you discuss any questions you have with your health care provider. °Document Released: 05/12/2008 Document Revised: 06/13/2016 Document Reviewed: 05/14/2016 °Elsevier Interactive Patient Education ©  2017 [employer] Inc[employer] ° °

## 2017-02-19 ENCOUNTER — Ambulatory Visit
Admission: RE | Admit: 2017-02-19 | Discharge: 2017-02-19 | Disposition: A | Discharge status: Home / Self Care | Payer: Medicaid Other | Source: Ambulatory Visit | Attending: Gastroenterology | Admitting: Gastroenterology

## 2017-02-19 ENCOUNTER — Ambulatory Visit: Payer: Medicaid Other | Admitting: Certified Registered"

## 2017-02-19 ENCOUNTER — Encounter
Admission: RE | Disposition: A | Discharge status: Home / Self Care | Payer: Self-pay | Source: Ambulatory Visit | Attending: Gastroenterology

## 2017-02-19 ENCOUNTER — Encounter: Payer: Self-pay | Admitting: *Deleted

## 2017-02-19 DIAGNOSIS — F4310 Post-traumatic stress disorder, unspecified: Secondary | ICD-10-CM | POA: Insufficient documentation

## 2017-02-19 DIAGNOSIS — K640 First degree hemorrhoids: Secondary | ICD-10-CM | POA: Insufficient documentation

## 2017-02-19 DIAGNOSIS — K219 Gastro-esophageal reflux disease without esophagitis: Secondary | ICD-10-CM | POA: Insufficient documentation

## 2017-02-19 DIAGNOSIS — Z923 Personal history of irradiation: Secondary | ICD-10-CM | POA: Diagnosis not present

## 2017-02-19 DIAGNOSIS — D649 Anemia, unspecified: Secondary | ICD-10-CM | POA: Diagnosis not present

## 2017-02-19 DIAGNOSIS — K621 Rectal polyp: Secondary | ICD-10-CM

## 2017-02-19 DIAGNOSIS — E039 Hypothyroidism, unspecified: Secondary | ICD-10-CM | POA: Diagnosis not present

## 2017-02-19 DIAGNOSIS — F329 Major depressive disorder, single episode, unspecified: Secondary | ICD-10-CM | POA: Diagnosis not present

## 2017-02-19 DIAGNOSIS — K625 Hemorrhage of anus and rectum: Secondary | ICD-10-CM | POA: Diagnosis not present

## 2017-02-19 DIAGNOSIS — Z79899 Other long term (current) drug therapy: Secondary | ICD-10-CM | POA: Insufficient documentation

## 2017-02-19 DIAGNOSIS — M419 Scoliosis, unspecified: Secondary | ICD-10-CM | POA: Diagnosis not present

## 2017-02-19 DIAGNOSIS — Z955 Presence of coronary angioplasty implant and graft: Secondary | ICD-10-CM | POA: Diagnosis not present

## 2017-02-19 DIAGNOSIS — Z85850 Personal history of malignant neoplasm of thyroid: Secondary | ICD-10-CM | POA: Insufficient documentation

## 2017-02-19 DIAGNOSIS — I341 Nonrheumatic mitral (valve) prolapse: Secondary | ICD-10-CM | POA: Insufficient documentation

## 2017-02-19 DIAGNOSIS — M1990 Unspecified osteoarthritis, unspecified site: Secondary | ICD-10-CM | POA: Insufficient documentation

## 2017-02-19 DIAGNOSIS — M797 Fibromyalgia: Secondary | ICD-10-CM | POA: Diagnosis not present

## 2017-02-19 DIAGNOSIS — J45909 Unspecified asthma, uncomplicated: Secondary | ICD-10-CM | POA: Diagnosis not present

## 2017-02-19 HISTORY — PX: COLONOSCOPY WITH PROPOFOL: SHX5780

## 2017-02-19 LAB — POCT PREGNANCY, URINE: Preg Test, Ur: NEGATIVE

## 2017-02-19 SURGERY — COLONOSCOPY WITH PROPOFOL
Anesthesia: General

## 2017-02-19 MED ORDER — PROPOFOL 10 MG/ML IV BOLUS
INTRAVENOUS | Status: DC | PRN
  Administered 2017-02-19: 70 mg via INTRAVENOUS

## 2017-02-19 MED ORDER — SODIUM CHLORIDE 0.9 % IV SOLN
INTRAVENOUS | Status: DC
Start: 2017-02-19 — End: 2017-02-19
  Administered 2017-02-19: 08:00:00 via INTRAVENOUS

## 2017-02-19 MED ORDER — MIDAZOLAM HCL 2 MG/2ML IJ SOLN
INTRAMUSCULAR | Status: AC
  Filled 2017-02-19: qty 2

## 2017-02-19 MED ORDER — PROPOFOL 500 MG/50ML IV EMUL
INTRAVENOUS | Status: AC
  Filled 2017-02-19: qty 50

## 2017-02-19 MED ORDER — MIDAZOLAM HCL 2 MG/2ML IJ SOLN
INTRAMUSCULAR | Status: DC | PRN
  Administered 2017-02-19: 2 mg via INTRAVENOUS

## 2017-02-19 MED ORDER — PROPOFOL 500 MG/50ML IV EMUL
INTRAVENOUS | Status: DC | PRN
  Administered 2017-02-19: 150 ug/kg/min via INTRAVENOUS

## 2017-02-19 NOTE — Anesthesia Procedure Notes (Signed)
Performed by: Marbella Markgraf Pre-anesthesia Checklist: Patient identified, Emergency Drugs available, Suction available, Patient being monitored and Timeout performed Patient Re-evaluated:Patient Re-evaluated prior to inductionOxygen Delivery Method: Nasal cannula Preoxygenation: Pre-oxygenation with 100% oxygen Intubation Type: IV induction       

## 2017-02-19 NOTE — Anesthesia Postprocedure Evaluation (Signed)
Anesthesia Post Note  Patient: GOLDEN EMILE  Procedure(s) Performed: Procedure(s) (LRB): COLONOSCOPY WITH PROPOFOL (N/A)  Patient location during evaluation: Endoscopy Anesthesia Type: General Level of consciousness: awake and alert Pain management: pain level controlled Vital Signs Assessment: post-procedure vital signs reviewed and stable Respiratory status: spontaneous breathing, nonlabored ventilation, respiratory function stable and patient connected to nasal cannula oxygen Cardiovascular status: blood pressure returned to baseline and stable Postop Assessment: no signs of nausea or vomiting Anesthetic complications: no     Last Vitals:  Vitals:   02/19/17 0916 02/19/17 0926  BP: 104/62 107/63  Pulse: 70 (!) 44  Resp: 16 12  Temp:      Last Pain:  Vitals:   02/19/17 0926  TempSrc:   PainSc: 0-No pain                 Martha Clan
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
## 2017-02-19 NOTE — Op Note (Signed)
[facility] Gastroenterology Patient Name: Kristina Li Procedure Date: 02/19/2017 8:07 AM MRN: [id number] Account #: [id number] Date of Birth: [birth year] Admit Type: Outpatient Age: 36 Room: [facility] ENDO ROOM 4 Gender: Female Note Status: Finalized Procedure:            Colonoscopy Indications:          Rectal bleeding Providers:            [name] MD, MD Referring MD:         Tania Ade (Referring MD) Medicines:            Monitored Anesthesia Care Complications:        No immediate complications. Procedure:            Pre-Anesthesia Assessment:                       - Prior to the procedure, a History and Physical was                        performed, and patient medications, allergies and                        sensitivities were reviewed. The patient's tolerance of                        previous anesthesia was reviewed.                       - The risks and benefits of the procedure and the                        sedation options and risks were discussed with the                        patient. All questions were answered and informed                        consent was obtained.                       - The risks and benefits of the procedure and the                        sedation options and risks were discussed with the                        patient. All questions were answered and informed                        consent was obtained.                       - ASA Grade Assessment: II - A patient with mild                        systemic disease.                       After obtaining informed consent, the colonoscope was                        passed  under direct vision. Throughout the procedure,                        the patient's blood pressure, pulse, and oxygen                        saturations were monitored continuously. The                        Colonoscope was introduced through the anus and                        advanced to the  the cecum, identified by the                        appendiceal orifice, IC valve and transillumination.                        The colonoscopy was performed with ease. The patient                        tolerated the procedure well. The quality of the bowel                        preparation was excellent. Findings:      The perianal and digital rectal examinations were normal.      Non-bleeding internal hemorrhoids were found during retroflexion. The       hemorrhoids were medium-sized and Grade I (internal hemorrhoids that do       not prolapse).      A 6 mm polyp was found in the rectum. The polyp was semi-sessile. The       polyp was removed with a cold snare. Resection and retrieval were       complete.      The exam was otherwise without abnormality on direct and retroflexion       views. Impression:           - Non-bleeding internal hemorrhoids.                       - One 6 mm polyp in the rectum, removed with a cold                        snare. Resected and retrieved.                       - The examination was otherwise normal on direct and                        retroflexion views. Recommendation:       - Discharge patient to home (with escort).                       - Resume previous diet.                       - Continue present medications.                       - Await pathology results.                       -  Repeat colonoscopy for surveillance based on                        pathology results.                       - Return to GI office in 6 weeks. Procedure Code(s):    --- Professional ---                       [phone], Colonoscopy, flexible; with removal of tumor(s),                        polyp(s), or other lesion(s) by snare technique Diagnosis Code(s):    --- Professional ---                       K62.1, Rectal polyp                       K64.0, First degree hemorrhoids                       K62.5, Hemorrhage of anus and rectum CPT copyright 2016 American  Medical Association. All rights reserved. The codes documented in this report are preliminary and upon coder review may  be revised to meet current compliance requirements. [name], MD [name] MD, MD 02/19/2017 8:54:03 AM This report has been signed electronically. Number of Addenda: 0 Note Initiated On: 02/19/2017 8:07 AM Scope Withdrawal Time: 0 hours 13 minutes 53 seconds  Total Procedure Duration: 0 hours 17 minutes 59 seconds       [facility]
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
## 2017-02-19 NOTE — Anesthesia Post-op Follow-up Note (Cosign Needed)
Anesthesia QCDR form completed.        

## 2017-02-19 NOTE — Transfer of Care (Signed)
Immediate Anesthesia Transfer of Care Note  Patient: Kristina Li  Procedure(s) Performed: Procedure(s): COLONOSCOPY WITH PROPOFOL (N/A)  Patient Location: PACU  Anesthesia Type:General  Level of Consciousness: sedated and responds to stimulation  Airway & Oxygen Therapy: Patient Spontanous Breathing and Patient connected to nasal cannula oxygen  Post-op Assessment: Report given to RN and Post -op Vital signs reviewed and stable  Post vital signs: Reviewed and stable  Last Vitals:  Vitals:   02/19/17 0750 02/19/17 0857  BP: 125/75 (!) 92/55  Pulse: 89   Resp: 14 18  Temp: 36.5 C     Last Pain:  Vitals:   02/19/17 0750  TempSrc: Tympanic         Complications: No apparent anesthesia complications

## 2017-02-20 ENCOUNTER — Telehealth: Payer: Self-pay

## 2017-02-20 ENCOUNTER — Encounter: Payer: Self-pay | Admitting: Gastroenterology

## 2017-02-20 LAB — SURGICAL PATHOLOGY

## 2017-02-20 NOTE — Telephone Encounter (Signed)
-----   Message from [name], MD sent at 02/20/2017 10:55 AM EDT ----- Benign polyp next colonoscopy at age 36

## 2017-02-20 NOTE — Telephone Encounter (Signed)
LVM for patient callback for results. 

## 2017-03-13 ENCOUNTER — Telehealth: Payer: Self-pay | Admitting: Gastroenterology

## 2017-03-13 NOTE — Telephone Encounter (Signed)
Patient returned your call.

## 2017-04-13 ENCOUNTER — Emergency Department
Admission: EM | Admit: 2017-04-13 | Discharge: 2017-04-14 | Disposition: A | Discharge status: Other Acute Inpatient Hospital | Payer: Medicaid Other | Attending: Emergency Medicine | Admitting: Emergency Medicine

## 2017-04-13 ENCOUNTER — Encounter: Payer: Self-pay | Admitting: Emergency Medicine

## 2017-04-13 DIAGNOSIS — R443 Hallucinations, unspecified: Secondary | ICD-10-CM | POA: Diagnosis present

## 2017-04-13 DIAGNOSIS — F29 Unspecified psychosis not due to a substance or known physiological condition: Secondary | ICD-10-CM | POA: Insufficient documentation

## 2017-04-13 DIAGNOSIS — J45909 Unspecified asthma, uncomplicated: Secondary | ICD-10-CM | POA: Diagnosis not present

## 2017-04-13 DIAGNOSIS — I10 Essential (primary) hypertension: Secondary | ICD-10-CM | POA: Insufficient documentation

## 2017-04-13 DIAGNOSIS — J449 Chronic obstructive pulmonary disease, unspecified: Secondary | ICD-10-CM | POA: Diagnosis not present

## 2017-04-13 DIAGNOSIS — Z79899 Other long term (current) drug therapy: Secondary | ICD-10-CM | POA: Insufficient documentation

## 2017-04-13 DIAGNOSIS — E039 Hypothyroidism, unspecified: Secondary | ICD-10-CM | POA: Diagnosis not present

## 2017-04-13 LAB — COMPREHENSIVE METABOLIC PANEL
ALBUMIN: 4 g/dL (ref 3.5–5.0)
ALT: 12 U/L — ABNORMAL LOW (ref 14–54)
ANION GAP: 6 (ref 5–15)
AST: 16 U/L (ref 15–41)
Alkaline Phosphatase: 56 U/L (ref 38–126)
BUN: 12 mg/dL (ref 6–20)
CO2: 26 mmol/L (ref 22–32)
Calcium: 8.8 mg/dL — ABNORMAL LOW (ref 8.9–10.3)
Chloride: 106 mmol/L (ref 101–111)
Creatinine, Ser: 0.87 mg/dL (ref 0.44–1.00)
GFR calc Af Amer: >60 mL/min (ref >60)
GFR calc non Af Amer: >60 mL/min (ref >60)
GLUCOSE: 86 mg/dL (ref 65–99)
POTASSIUM: 3.8 mmol/L (ref 3.5–5.1)
SODIUM: 138 mmol/L (ref 135–145)
TOTAL PROTEIN: 7.4 g/dL (ref 6.5–8.1)
Total Bilirubin: 0.4 mg/dL (ref 0.3–1.2)

## 2017-04-13 LAB — CBC
HEMATOCRIT: 34.1 % — AB (ref 35.0–47.0)
Hemoglobin: 11.1 g/dL — ABNORMAL LOW (ref 12.0–16.0)
MCH: 25.4 pg — ABNORMAL LOW (ref 26.0–34.0)
MCHC: 32.6 g/dL (ref 32.0–36.0)
MCV: 78 fL — AB (ref 80.0–100.0)
Platelets: 221 10*3/uL (ref 150–440)
RBC: 4.37 MIL/uL (ref 3.80–5.20)
RDW: 16.2 % — AB (ref 11.5–14.5)
WBC: 7.6 10*3/uL (ref 3.6–11.0)

## 2017-04-13 LAB — SALICYLATE LEVEL: Salicylate Lvl: <7 mg/dL (ref 2.8–30.0)

## 2017-04-13 LAB — ACETAMINOPHEN LEVEL

## 2017-04-13 LAB — ETHANOL: Alcohol, Ethyl (B): <5 mg/dL (ref <5)

## 2017-04-13 NOTE — ED Notes (Signed)
Pt reports hearing voices for past 2-3 weeks.  Pt states I think my medicines are messed up and its causing me to hear voices.  No SI or HI.  Denies drug or etoh use.  Pt calm and cooperative.

## 2017-04-13 NOTE — ED Notes (Signed)
Patient unable to urinate at this time. 

## 2017-04-13 NOTE — ED Triage Notes (Signed)
Patient to ER for c/o hearing voices while at home, none currently. States she has been on the following medications and believes it may be d/t meds:  Gabapentin 600mg  Amatriptyline Abilify  States she recently increased Abilify to 30mg , but no changes in meds otherwise. Patient denies voices telling her to do anything, but states "it sounded like they were talking to each other". Patient to ER voluntarily at this time.  States this week was the first time she has ever heard voices. Denies SI, but states "I've just been feeling down and feeling nervous about what I was hearing.".  Patient lives with mother and step father.

## 2017-04-13 NOTE — ED Notes (Signed)
Patient's belongings: Kristina Li including $14 in one dollar bills, $10 in ten dollar bills, $20 in twenty dollar bills. Total = $44 Also has 1 platinum debit card with [place] [phone]), as well as EBT card ([phone]).  Cell phone with charger Ring, necklace, pair of earrings (all gray metal in color) Clothing with flip flops  This RN counted money with second verification from [facility] [place], [place].
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
## 2017-04-14 ENCOUNTER — Other Ambulatory Visit: Payer: Self-pay

## 2017-04-14 ENCOUNTER — Inpatient Hospital Stay
Admission: EM | Admit: 2017-04-14 | Discharge: 2017-04-17 | DRG: 885 | Disposition: A | Discharge status: Home / Self Care | Payer: Medicaid Other | Source: Intra-hospital | Attending: Psychiatry | Admitting: Psychiatry

## 2017-04-14 DIAGNOSIS — F203 Undifferentiated schizophrenia: Secondary | ICD-10-CM | POA: Diagnosis not present

## 2017-04-14 DIAGNOSIS — K219 Gastro-esophageal reflux disease without esophagitis: Secondary | ICD-10-CM | POA: Diagnosis present

## 2017-04-14 DIAGNOSIS — A6000 Herpesviral infection of urogenital system, unspecified: Secondary | ICD-10-CM | POA: Diagnosis present

## 2017-04-14 DIAGNOSIS — Z91048 Other nonmedicinal substance allergy status: Secondary | ICD-10-CM | POA: Diagnosis not present

## 2017-04-14 DIAGNOSIS — C73 Malignant neoplasm of thyroid gland: Secondary | ICD-10-CM | POA: Diagnosis present

## 2017-04-14 DIAGNOSIS — G479 Sleep disorder, unspecified: Secondary | ICD-10-CM | POA: Diagnosis present

## 2017-04-14 DIAGNOSIS — Z886 Allergy status to analgesic agent status: Secondary | ICD-10-CM | POA: Diagnosis not present

## 2017-04-14 DIAGNOSIS — G4700 Insomnia, unspecified: Secondary | ICD-10-CM | POA: Diagnosis present

## 2017-04-14 DIAGNOSIS — Z818 Family history of other mental and behavioral disorders: Secondary | ICD-10-CM

## 2017-04-14 DIAGNOSIS — Z8614 Personal history of Methicillin resistant Staphylococcus aureus infection: Secondary | ICD-10-CM | POA: Diagnosis not present

## 2017-04-14 DIAGNOSIS — Z91013 Allergy to seafood: Secondary | ICD-10-CM

## 2017-04-14 DIAGNOSIS — F4310 Post-traumatic stress disorder, unspecified: Secondary | ICD-10-CM | POA: Diagnosis present

## 2017-04-14 DIAGNOSIS — I10 Essential (primary) hypertension: Secondary | ICD-10-CM | POA: Diagnosis present

## 2017-04-14 DIAGNOSIS — Z79899 Other long term (current) drug therapy: Secondary | ICD-10-CM | POA: Diagnosis not present

## 2017-04-14 DIAGNOSIS — M797 Fibromyalgia: Secondary | ICD-10-CM | POA: Diagnosis present

## 2017-04-14 DIAGNOSIS — F29 Unspecified psychosis not due to a substance or known physiological condition: Secondary | ICD-10-CM | POA: Diagnosis not present

## 2017-04-14 DIAGNOSIS — J449 Chronic obstructive pulmonary disease, unspecified: Secondary | ICD-10-CM | POA: Diagnosis present

## 2017-04-14 DIAGNOSIS — Z91040 Latex allergy status: Secondary | ICD-10-CM

## 2017-04-14 DIAGNOSIS — Z5181 Encounter for therapeutic drug level monitoring: Secondary | ICD-10-CM

## 2017-04-14 LAB — URINE DRUG SCREEN, QUALITATIVE (ARMC ONLY)
AMPHETAMINES, UR SCREEN: NOT DETECTED
Barbiturates, Ur Screen: NOT DETECTED
Benzodiazepine, Ur Scrn: NOT DETECTED
CANNABINOID 50 NG, UR ~~LOC~~: NOT DETECTED
COCAINE METABOLITE, UR ~~LOC~~: NOT DETECTED
MDMA (ECSTASY) UR SCREEN: NOT DETECTED
METHADONE SCREEN, URINE: NOT DETECTED
Opiate, Ur Screen: NOT DETECTED
Phencyclidine (PCP) Ur S: NOT DETECTED
TRICYCLIC, UR SCREEN: POSITIVE — AB

## 2017-04-14 LAB — POCT PREGNANCY, URINE: PREG TEST UR: NEGATIVE

## 2017-04-14 MED ORDER — LORAZEPAM 2 MG PO TABS
ORAL_TABLET | ORAL | Status: AC
  Administered 2017-04-14: 2 mg via ORAL
  Filled 2017-04-14: qty 1

## 2017-04-14 MED ORDER — ALBUTEROL SULFATE HFA 108 (90 BASE) MCG/ACT IN AERS
2.0000 | INHALATION_SPRAY | RESPIRATORY_TRACT | Status: DC | PRN
  Filled 2017-04-14: qty 6.7

## 2017-04-14 MED ORDER — PANTOPRAZOLE SODIUM 40 MG PO TBEC
DELAYED_RELEASE_TABLET | ORAL | Status: AC
  Administered 2017-04-14: 40 mg via ORAL
  Filled 2017-04-14: qty 1

## 2017-04-14 MED ORDER — METOPROLOL SUCCINATE ER 50 MG PO TB24: 50.0000 mg | ORAL_TABLET | Freq: Every day | ORAL | Status: DC

## 2017-04-14 MED ORDER — PANTOPRAZOLE SODIUM 40 MG PO TBEC
40.0000 mg | DELAYED_RELEASE_TABLET | Freq: Every day | ORAL | Status: DC
  Administered 2017-04-14: 40 mg via ORAL

## 2017-04-14 MED ORDER — LORAZEPAM 2 MG PO TABS
2.0000 mg | ORAL_TABLET | ORAL | Status: DC | PRN
  Administered 2017-04-14: 2 mg via ORAL

## 2017-04-14 MED ORDER — TRAZODONE HCL 50 MG PO TABS
50.0000 mg | ORAL_TABLET | Freq: Every day | ORAL | Status: DC
  Administered 2017-04-14: 50 mg via ORAL
  Filled 2017-04-14: qty 1

## 2017-04-14 MED ORDER — METOPROLOL SUCCINATE ER 50 MG PO TB24
ORAL_TABLET | ORAL | Status: DC
Start: 2017-04-14 — End: 2017-04-14
  Filled 2017-04-14: qty 1

## 2017-04-14 MED ORDER — ARIPIPRAZOLE 10 MG PO TABS
20.0000 mg | ORAL_TABLET | Freq: Every day | ORAL | Status: DC
  Administered 2017-04-14: 20 mg via ORAL
  Filled 2017-04-14: qty 2

## 2017-04-14 MED ORDER — AMITRIPTYLINE HCL 25 MG PO TABS
25.0000 mg | ORAL_TABLET | Freq: Every day | ORAL | Status: DC
  Administered 2017-04-14: 25 mg via ORAL
  Filled 2017-04-14 (×3): qty 1

## 2017-04-14 MED ORDER — ACETAMINOPHEN 325 MG PO TABS
650.0000 mg | ORAL_TABLET | Freq: Four times a day (QID) | ORAL | Status: DC | PRN
  Administered 2017-04-14 (×2): 650 mg via ORAL
  Filled 2017-04-14 (×2): qty 2

## 2017-04-14 MED ORDER — LEVOTHYROXINE SODIUM 75 MCG PO TABS
75.0000 ug | ORAL_TABLET | Freq: Every day | ORAL | Status: DC
  Administered 2017-04-14: 75 ug via ORAL

## 2017-04-14 NOTE — ED Notes (Addendum)
Report called to [facility] RN. Patient aware of transfer. Patient has signed paperwork. Plan to be admitted to unit at 2330.

## 2017-04-14 NOTE — ED Notes (Addendum)
Informed Katie, RN-Charge of need of an EKG for this patient

## 2017-04-14 NOTE — ED Notes (Signed)
Per Benjamine Mola, RN orientee, pt states she feels like she needs rest more than anything (has been sleeping to time of writing and had to be awoken to discuss transfer). Based on the patients request, writer called Adult unit and talked with Leafy Ro, RN expressing pts request/need for restorative rest (she also got a substantial dose of Ativan at 0500). We agreed that patient could continue to rest in the [facility] and we would look to transfer her this afternoon.

## 2017-04-14 NOTE — Consult Note (Signed)
Chart reviewed. Patient appears to require inpatient admission. Orders completed.

## 2017-04-14 NOTE — ED Notes (Signed)
Call placed to [facility], RN charge to remind her of the need for an EKG for this patient
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
## 2017-04-14 NOTE — ED Notes (Signed)
Patient is alert and oriented x 4.  Patient presents with calm affect and mood is appropriate to situation.  Reports auditory hallucination while awake.  Denies suicidal thoughts.  Reports goal for today is to get enough sleep and rest.  Medications given as prescribed.  Patient offered support and encouragements as needed.  Routine safety checks maintained. Patient is in bed resting.  No behavioral issues noted.

## 2017-04-14 NOTE — ED Notes (Signed)
Report off to [employer]

## 2017-04-14 NOTE — ED Notes (Signed)

## 2017-04-14 NOTE — ED Notes (Signed)
Per Gwen, the BMU will not be able to accept this patient until after change of shift. Pt calm and cooperative, resting quietly

## 2017-04-14 NOTE — ED Notes (Signed)
12 lead performed on the patient. Print out of the EKG left with [place].

## 2017-04-14 NOTE — ED Notes (Signed)
Pt. Alert and oriented, warm and dry, in no distress. Pt. Denies SI, HI, and VH. Pt reporting AH. Pt states, "the voices are not as loud as they where yesterday. I think they are better because I have been able to get some sleep." Pt. Encouraged to let nursing staff know of any concerns or needs.

## 2017-04-14 NOTE — BH Assessment (Signed)
Patient is to be admitted to [place] by Dr. Weber Cooks.  Attending Physician will be Dr. Bary Leriche.   Patient has been assigned to room 303A, by [place].   Intake Paper Work has been signed and placed on patient chart.  ER staff is aware of the admission Lattie Haw, ER Sect.; Dr. Jimmye Norman, ER MD;  Mateo Flow, Patient's Nurse & Mateo Flow, Patient Access).

## 2017-04-14 NOTE — ED Notes (Signed)
ED BHU [place] Is the patient under IVC or is there intent for IVC: No. Is the patient medically cleared: Yes.   Is there vacancy in the ED BHU: Yes.   Is the population mix appropriate for patient: Yes.   Is the patient awaiting placement in inpatient or outpatient setting: Yes.   Has the patient had a psychiatric consult: Yes.   Survey of unit performed for contraband, proper placement and condition of furniture, tampering with fixtures in bathroom, shower, and each patient room: Yes.  ; Findings: NA APPEARANCE/BEHAVIOR calm, cooperative and adequate rapport can be established NEURO ASSESSMENT Orientation: time, place and person Hallucinations: Yes.  Auditory Hallucinations Speech: Normal Gait: normal RESPIRATORY ASSESSMENT Normal expansion.  Clear to auscultation.  No rales, rhonchi, or wheezing. CARDIOVASCULAR ASSESSMENT regular rate and rhythm, S1, S2 normal, no murmur, click, rub or gallop GASTROINTESTINAL ASSESSMENT soft, nontender, BS WNL, no r/g EXTREMITIES normal strength, tone, and muscle mass PLAN OF CARE Provide calm/safe environment. Vital signs assessed twice daily. ED BHU Assessment once each 12-hour shift. Collaborate with intake RN daily or as condition indicates. Assure the ED provider has rounded once each shift. Provide and encourage hygiene. Provide redirection as needed. Assess for escalating behavior; address immediately and inform ED provider.  Assess family dynamic and appropriateness for visitation as needed: Yes.  ; If necessary, describe findings: NA Educate the patient/family about BHU procedures/visitation: Yes.  ; If necessary, describe findings: NA

## 2017-04-14 NOTE — BH Assessment (Signed)
Per chart, pt recommended for inpatient admission by Dr.[name]. Pt chart currently under review by Charge RN Meredith Mody) for possible [facility] placement.

## 2017-04-14 NOTE — ED Notes (Signed)
Pt. To BHU from ED ambulatory without difficulty, to room  BHU5. Report from [facility]. Pt. Is alert and oriented, warm and dry in no distress. Pt. Denies SI, HI, and VH. Pt states having AH. Hearing voices but just chatter. Pt. Calm and cooperative. Pt. Made aware of security cameras and Q15 minute rounds. Pt. Encouraged to let Nursing staff know of any concerns or needs.

## 2017-04-14 NOTE — ED Notes (Signed)
ED BHU [place] Is the patient under IVC or is there intent for IVC: No. Is the patient medically cleared: Yes.   Is there vacancy in the ED BHU: Yes.   Is the population mix appropriate for patient: Yes.   Is the patient awaiting placement in inpatient or outpatient setting: Yes.   Has the patient had a psychiatric consult: Yes.   Survey of unit performed for contraband, proper placement and condition of furniture, tampering with fixtures in bathroom, shower, and each patient room: Yes.  ; Findings: NA APPEARANCE/BEHAVIOR calm, cooperative and adequate rapport can be established NEURO ASSESSMENT Orientation: time, place and person Hallucinations: Yes.  Auditory Hallucinations Speech: Normal Gait: normal RESPIRATORY ASSESSMENT Normal expansion.  Clear to auscultation.  No rales, rhonchi, or wheezing. CARDIOVASCULAR ASSESSMENT regular rate and rhythm, S1, S2 normal, no murmur, click, rub or gallop GASTROINTESTINAL ASSESSMENT soft, nontender, BS WNL, no r/g EXTREMITIES normal strength, tone, and muscle mass PLAN OF CARE Provide calm/safe environment. Vital signs assessed twice daily. ED BHU Assessment once each 12-hour shift. Collaborate with intake RN daily or as condition indicates. Assure the ED provider has rounded once each shift. Provide and encourage hygiene. Provide redirection as needed. Assess for escalating behavior; address immediately and inform ED provider.  Assess family dynamic and appropriateness for visitation as needed: Yes.  ; If necessary, describe findings: na Educate the patient/family about BHU procedures/visitation: Yes.  ; If necessary, describe findings: na

## 2017-04-15 ENCOUNTER — Encounter: Payer: Self-pay | Admitting: Psychiatry

## 2017-04-15 DIAGNOSIS — F203 Undifferentiated schizophrenia: Secondary | ICD-10-CM | POA: Diagnosis present

## 2017-04-15 LAB — LIPID PANEL
Cholesterol: 170 mg/dL (ref 0–200)
HDL: 55 mg/dL (ref >40)
LDL CALC: 103 mg/dL — AB (ref 0–99)
TRIGLYCERIDES: 60 mg/dL (ref <150)
Total CHOL/HDL Ratio: 3.1 RATIO
VLDL: 12 mg/dL (ref 0–40)

## 2017-04-15 LAB — TSH: TSH: 4.653 u[IU]/mL — ABNORMAL HIGH (ref 0.350–4.500)

## 2017-04-15 MED ORDER — TRAZODONE HCL 50 MG PO TABS: 50.0000 mg | ORAL_TABLET | Freq: Every evening | ORAL | Status: DC | PRN

## 2017-04-15 MED ORDER — ETODOLAC 200 MG PO CAPS
200.0000 mg | ORAL_CAPSULE | Freq: Two times a day (BID) | ORAL | Status: DC
  Administered 2017-04-15 – 2017-04-17 (×4): 200 mg via ORAL
  Filled 2017-04-15 (×4): qty 1

## 2017-04-15 MED ORDER — HYDROXYZINE HCL 25 MG PO TABS
25.0000 mg | ORAL_TABLET | Freq: Three times a day (TID) | ORAL | Status: DC | PRN
  Administered 2017-04-15 – 2017-04-16 (×2): 25 mg via ORAL
  Filled 2017-04-15 (×2): qty 1

## 2017-04-15 MED ORDER — LORAZEPAM 2 MG PO TABS: 2.0000 mg | ORAL_TABLET | ORAL | Status: DC | PRN

## 2017-04-15 MED ORDER — ALBUTEROL SULFATE HFA 108 (90 BASE) MCG/ACT IN AERS
2.0000 | INHALATION_SPRAY | RESPIRATORY_TRACT | Status: DC | PRN
  Administered 2017-04-15 – 2017-04-17 (×2): 2 via RESPIRATORY_TRACT
  Filled 2017-04-15: qty 6.7

## 2017-04-15 MED ORDER — METOPROLOL SUCCINATE ER 50 MG PO TB24
50.0000 mg | ORAL_TABLET | Freq: Every day | ORAL | Status: DC
Start: 2017-04-15 — End: 2017-04-17
  Administered 2017-04-15 – 2017-04-17 (×3): 50 mg via ORAL
  Filled 2017-04-15 (×3): qty 1

## 2017-04-15 MED ORDER — ARIPIPRAZOLE 10 MG PO TABS
30.0000 mg | ORAL_TABLET | Freq: Every day | ORAL | Status: DC
  Administered 2017-04-16: 30 mg via ORAL
  Filled 2017-04-15: qty 3

## 2017-04-15 MED ORDER — PANTOPRAZOLE SODIUM 40 MG PO TBEC
40.0000 mg | DELAYED_RELEASE_TABLET | Freq: Every day | ORAL | Status: DC
  Administered 2017-04-15 – 2017-04-17 (×3): 40 mg via ORAL
  Filled 2017-04-15 (×3): qty 1

## 2017-04-15 MED ORDER — AMITRIPTYLINE HCL 25 MG PO TABS
25.0000 mg | ORAL_TABLET | Freq: Every day | ORAL | Status: DC
  Administered 2017-04-15: 25 mg via ORAL
  Filled 2017-04-15: qty 1

## 2017-04-15 MED ORDER — ARIPIPRAZOLE 10 MG PO TABS: 20.0000 mg | ORAL_TABLET | Freq: Every day | ORAL | Status: DC

## 2017-04-15 MED ORDER — TRAZODONE HCL 50 MG PO TABS
50.0000 mg | ORAL_TABLET | Freq: Every day | ORAL | Status: DC
  Administered 2017-04-15: 50 mg via ORAL
  Filled 2017-04-15: qty 1

## 2017-04-15 MED ORDER — ALUM & MAG HYDROXIDE-SIMETH 200-200-20 MG/5ML PO SUSP: 30.0000 mL | ORAL | Status: DC | PRN

## 2017-04-15 MED ORDER — MAGNESIUM HYDROXIDE 400 MG/5ML PO SUSP: 30.0000 mL | Freq: Every day | ORAL | Status: DC | PRN

## 2017-04-15 MED ORDER — ACETAMINOPHEN 325 MG PO TABS
650.0000 mg | ORAL_TABLET | Freq: Four times a day (QID) | ORAL | Status: DC | PRN
  Administered 2017-04-16: 650 mg via ORAL
  Filled 2017-04-15 (×2): qty 2

## 2017-04-15 MED ORDER — LEVOTHYROXINE SODIUM 50 MCG PO TABS
75.0000 ug | ORAL_TABLET | Freq: Every day | ORAL | Status: DC
  Administered 2017-04-15 – 2017-04-17 (×3): 75 ug via ORAL
  Filled 2017-04-15 (×3): qty 1

## 2017-04-15 MED ORDER — LIDOCAINE 5 % EX PTCH
1.0000 | MEDICATED_PATCH | CUTANEOUS | Status: DC
  Administered 2017-04-15 – 2017-04-16 (×2): 1 via TRANSDERMAL
  Filled 2017-04-15 (×4): qty 1
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
## 2017-04-15 NOTE — [facility] Suicide Risk Assessment (Signed)
[place] INPATIENT:  Family/Significant Other Suicide Prevention Education  Suicide Prevention Education:  Education Completed; mother, Starleen Arms ph#: [phone] has been identified by the patient as the family member/significant other with whom the patient will be residing, and identified as the person(s) who will aid the patient in the event of a mental health crisis (suicidal ideations/suicide attempt).  With written consent from the patient, the family member/significant other has been provided the following suicide prevention education, prior to the and/or following the discharge of the patient.  The suicide prevention education provided includes the following:  Suicide risk factors  Suicide prevention and interventions  National Suicide Hotline telephone number  [facility] assessment telephone number  [facility] Emergency Assistance [place] and/or Residential Mobile Crisis Unit telephone number  Request made of family/significant other to:  Remove weapons (e.g., guns, rifles, knives), all items previously/currently identified as safety concern.    Remove drugs/medications (over-the-counter, prescriptions, illicit drugs), all items previously/currently identified as a safety concern.  The family member/significant other verbalizes understanding of the suicide prevention education information provided.  The family member/significant other agrees to remove the items of safety concern listed above.  Emilie Rutter, MSW, LCSW-A 04/15/2017, 2:55 PM

## 2017-04-15 NOTE — Progress Notes (Signed)
D: Patient stated slept good last night .Stated appetite fairand energy level  Iow Stated concentration is good . Stated on Depression scale 8 , hopeless 7 and anxiety 7 .( low 0-10 high) Denies suicidal  homicidal ideations  .  No auditory hallucinations  No pain concerns . Appropriate ADL'S. Interacting with peers and staff. Attending unit programing.  Patient  Very needy and somatic   A: Encourage patient participation with unit programming . Instruction  Given on  Medication , verbalize understanding. R: Voice no other concerns. Staff continue to monitor

## 2017-04-15 NOTE — B[facility] Group Notes (Signed)
[facility] LCSW Group Therapy   04/15/2017  1:00 pm   Type of Therapy: Group Therapy   Participation Level: Active   Participation Quality: Attentive, Sharing and Supportive   Affect: Appropriate   Cognitive: Alert and Oriented   Insight: Developing/Improving and Engaged   Engagement in Therapy: Developing/Improving and Engaged   Modes of Intervention: Clarification, Confrontation, Discussion, Education, Exploration, Limit-setting, Orientation, Problem-solving, Rapport Building, Art therapist, Socialization and Support   Summary of Progress/Problems: The topic for group today was emotional regulation. This group focused on both positive and negative emotion identification and allowed  group members to process ways to identify feelings, regulate negative emotions, and find healthy ways to manage internal/external emotions. Group members were asked to reflect on a time when their reaction to an emotion led to a negative outcome and explored how alternative responses using emotion regulation would have benefited them. Group members were also asked to discuss a time when emotion regulation was utilized when a negative emotion was experienced.     Glorious Peach, MSW, LCSWA 04/15/2017, 1:37PM

## 2017-04-15 NOTE — Progress Notes (Signed)
Recreation Therapy Notes  INPATIENT RECREATION THERAPY ASSESSMENT  Patient Details Name: Kristina Li MRN: [id number] DOB: Apr 24, 1981 Today's Date: 04/15/2017  Patient Stressors: Other (Comment) (Dealing with Fibromyalgia - has to have help to do things around the house)  Coping Skills:   Isolate, Exercise, Art/Dance, Talking, Music, Sports, Other (Comment) (Talk about positive things, spend time with mom, journal, pray)  Personal Challenges: Communication, Concentration, Decision-Making, Relationships, Social Interaction, Trusting Others  Leisure Interests (2+):  Music - Listen, Exercise - Walking, Individual - Other (Comment) Charity fundraiser)  Awareness of Community Resources:  Yes  Community Resources:  [place], Tax inspector  Current Use: Yes  If no, Barriers?:    Patient Strengths:  Good listener, good friend  Patient Identified Areas of Improvement:  Social skills, not isolating, concentration  Current Recreation Participation:  Listen to music, talk to others  Patient Goal for Hospitalization:  To get medications adjusted and pain level to go down  [place] of Residence:  [place] Residence:  [place]   Current SI (including self-harm):  No  Current HI:  No  Consent to Intern Participation: N/A   Leonette Monarch, LRT/CTRS 04/15/2017, 2:45 PM

## 2017-04-15 NOTE — [facility] Suicide Risk Assessment (Signed)
[facility] Admission Suicide Risk Assessment   Nursing information obtained from:    Demographic factors:    Current Mental Status:    Loss Factors:    Historical Factors:    Risk Reduction Factors:     Total Time spent with patient: 1 hour Principal Problem: Undifferentiated schizophrenia ([place]) Diagnosis:   Patient Active Problem List   Diagnosis Date Noted  . Undifferentiated schizophrenia ([place]) [F20.3] 04/15/2017  . Rectal polyp [K62.1]   . First degree hemorrhoids [K64.0]   . Hemorrhage of rectum and anus [K62.5]   . 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 HTN (hypertension) [I10] 08/15/2016  . COPD (chronic obstructive pulmonary disease) ([place]) [J44.9] 08/15/2016  . 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 DDD (degenerative disc disease), lumbar [M51.36] 03/18/2016  . 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 Lumbar radiculopathy [M54.16] 03/18/2016  . 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 Cervicalgia [M54.2] 01/31/2016  . Sacroiliac joint dysfunction [M53.3] 01/31/2016  . Myofascial pain [M79.1] 01/31/2016  . Fibromyalgia [M79.7] 01/31/2016  . Bilateral occipital neuralgia [M54.81] 01/31/2016  . Thyroid cancer ([place]) [C73]   . Post traumatic stress disorder (PTSD) [F43.10]   . Gastroesophageal reflux disease [K21.9]   . Endometriosis [N80.9]    Subjective Data: hallucinations.  Continued Clinical Symptoms:  Alcohol Use Disorder Identification Test Final Score (AUDIT): 0 The "Alcohol Use Disorders Identification Test", Guidelines for Use in Primary Care, Second Edition.  World Pharmacologist [facility] ([place] Score between 0-7:  no or low risk or alcohol related problems. Score between 8-15:  moderate risk of alcohol related problems. Score between 16-19:  high risk of alcohol related problems. Score 20 or above:  warrants further diagnostic evaluation for alcohol dependence and treatment.   CLINICAL  FACTORS:   Depression:   Delusional Impulsivity Severe Schizophrenia:   Depressive state Paranoid or undifferentiated type   Musculoskeletal: Strength & Muscle Tone: within normal limits Gait & Station: normal Patient leans: N/A  Psychiatric Specialty Exam: Physical Exam  Nursing note and vitals reviewed. Psychiatric: Her speech is delayed. She is actively hallucinating. Thought content is paranoid. Cognition and memory are normal. She expresses impulsivity. She exhibits a depressed mood.    Review of Systems  Respiratory: Positive for shortness of breath.   Musculoskeletal: Positive for back pain, joint pain and myalgias.  Psychiatric/Behavioral: Positive for depression and hallucinations. The patient is nervous/anxious.   All other systems reviewed and are negative.   Blood pressure 122/66, pulse 89, temperature 98.4 F (36.9 C), resp. rate 18, height 5\' 7"  (1.702 m), weight 78 kg (172 lb), last menstrual period 03/17/2017.Body mass index is 26.94 kg/m.  General Appearance: Casual  Eye Contact:  Good  Speech:  Clear and Coherent  Volume:  Normal  Mood:  Depressed  Affect:  Blunt  Thought Process:  Goal Directed and Descriptions of Associations: Intact  Orientation:  Full (Time, Place, and Person)  Thought Content:  Hallucinations: Auditory  Suicidal Thoughts:  No  Homicidal Thoughts:  No  Memory:  Immediate;   Fair Recent;   Fair Remote;   Fair  Judgement:  Fair  Insight:  Shallow  Psychomotor Activity:  Psychomotor Retardation  Concentration:  Concentration: Fair and Attention Span: Fair  Recall:  [employer] of Knowledge:  Fair  Language:  Fair  Akathisia:  No  Handed:  Right  AIMS (if indicated):     Assets:  Communication Skills Desire for Improvement Financial Resources/Insurance Housing Resilience Social  Support  ADL's:  Intact  Cognition:  WNL  Sleep:         COGNITIVE FEATURES THAT CONTRIBUTE TO RISK:  None    SUICIDE RISK:   Mild:   Suicidal ideation of limited frequency, intensity, duration, and specificity.  There are no identifiable plans, no associated intent, mild dysphoria and related symptoms, good self-control (both objective and subjective assessment), few other risk factors, and identifiable protective factors, including available and accessible social support.  PLAN OF CARE: Hospital admision, medication management, discharge planning.  Ms. Nessel is a 36 years old female with a history of depression admitted for new onset psychosis.  1. Psychosis. We continued Abilify for the voices.  2. COPD. She is on Albuterol.  3. History of thyroid cancer. She is on Synthroid. TSH slightly elevated. Consider increasing Synthroid.  4. HTN. She is on Metoprolol.  5. GERD. She is on Protonix.  6. Insomnia. She is on Trazodone and Elavil.  7. Metabolic syndrome monitoring. Lipid panel is normal, TSH 4.6, HgbA1C are pending.  8. EKG. Normal sinus rhythm, QTc 430.  9. Pregnancy test is negative.   10. Fibromyalgia. We offered lidocaine patch and Lodine. The patient no longer wants to take Neurontin due to weight gain.   11. Disposition. She will be discharged with her parents. She will follow up with [facility].  I certify that inpatient services furnished can reasonably be expected to improve the patient's condition.   Orson Slick, MD 04/15/2017, 12:19 PM

## 2017-04-15 NOTE — Tx Team (Addendum)
Interdisciplinary Treatment and Diagnostic Plan Update  04/15/2017 Time of Session: 10:30 AM Kristina Li MRN: [id number]  Principal Diagnosis: Undifferentiated schizophrenia Kristina Li)  Secondary Diagnoses: Principal Problem:   Undifferentiated schizophrenia (Kristina Li) Active Problems:   Thyroid cancer (Kristina Li)   Gastroesophageal reflux disease   Fibromyalgia   HTN (hypertension)   COPD (chronic obstructive pulmonary disease) (HCC)   Current Medications:  Current Facility-Administered Medications  Medication Dose Route Frequency Provider Last Rate Last Dose  . acetaminophen (TYLENOL) tablet 650 mg  650 mg Oral Q6H PRN [name], [name], MD      . albuterol (PROVENTIL HFA;VENTOLIN HFA) 108 (90 Base) MCG/ACT inhaler 2 puff  2 puff Inhalation Q4H PRN [name], [name], MD      . alum & mag hydroxide-simeth (MAALOX/MYLANTA) 200-200-20 MG/5ML suspension 30 mL  30 mL Oral Q4H PRN [name], [name], MD      . amitriptyline (ELAVIL) tablet 25 mg  25 mg Oral QHS [name], [name], MD      . Derrill Memo ON 04/16/2017] ARIPiprazole (ABILIFY) tablet 30 mg  30 mg Oral QHS [name], [name], MD      . etodolac (LODINE) capsule 200 mg  200 mg Oral BID [name], [name], MD      . hydrOXYzine (ATARAX/VISTARIL) tablet 25 mg  25 mg Oral TID PRN [name], [name], MD      . levothyroxine (SYNTHROID, LEVOTHROID) tablet 75 mcg  75 mcg Oral QAC breakfast [name], Kristina Ren[place], MD   75 mcg at 04/15/17 0751  . lidocaine (LIDODERM) 5 % 1 patch  1 patch Transdermal Q24H [name], [name], MD      . magnesium hydroxide (MILK OF MAGNESIA) suspension 30 mL  30 mL Oral Daily PRN [name], [name], MD      . metoprolol succinate (TOPROL-XL) 24 hr tablet 50 mg  50 mg Oral Daily [name], Kristina Reno, MD   50 mg at 04/15/17 0752  . pantoprazole (PROTONIX) EC tablet 40 mg  40 mg Oral Daily [name], Kristina Reno, MD   40 mg at 04/15/17 0752  . traZODone (DESYREL) tablet 50 mg  50 mg Oral QHS [name], [name], MD       PTA  Medications: Prescriptions Prior to Admission  Medication Sig Dispense Refill Last Dose  . gabapentin (NEURONTIN) 100 MG capsule Take 2 capsules (200 mg total) by mouth 3 (three) times daily. 90 capsule 1 04/14/2017 at Unknown time  . albuterol (PROVENTIL HFA;VENTOLIN HFA) 108 (90 Base) MCG/ACT inhaler Inhale 1-2 puffs into the lungs every 6 (six) hours as needed for wheezing or shortness of breath. 1 Inhaler 0 Taking  . amitriptyline (ELAVIL) 25 MG tablet Take 2 tablets (50 mg total) by mouth at bedtime. 30 tablet 0 02/18/2017 at Unknown time  . ARIPiprazole (ABILIFY) 10 MG tablet Take 1 tablet (10 mg total) by mouth daily. 30 tablet 0 02/18/2017 at Unknown time  . ARIPiprazole ER 400 MG SRER Inject 400 mg into the muscle every 28 (twenty-eight) days. 1 each 1 Taking  . beclomethasone (QVAR) 40 MCG/ACT inhaler Inhale into the lungs.   Taking  . beclomethasone (QVAR) 40 MCG/ACT inhaler Inhale into the lungs.     . cyclobenzaprine (FLEXERIL) 5 MG tablet Take 1 tablet (5 mg total) by mouth 3 (three) times daily as needed for muscle spasms. 90 tablet 1 Taking  . dicyclomine (BENTYL) 20 MG tablet Take 1 tablet (20 mg total) by mouth 3 (three) times daily as needed (abdominal pain). Kristina Li  tablet 0 Taking  . hydrocortisone cream 1 % Apply topically.     Kristina Li levothyroxine (SYNTHROID) 75 MCG tablet Take 1 tablet (75 mcg total) by mouth daily. 90 tablet 3 02/18/2017 at Unknown time  . liothyronine (CYTOMEL) 5 MCG tablet Take 5 mcg by mouth daily.   02/18/2017 at Unknown time  . metoprolol succinate (TOPROL-XL) 50 MG 24 hr tablet Take 1 tablet (50 mg total) by mouth daily. Reported on 04/16/2016 30 tablet 5 02/19/2017 at 0630  . Multiple Vitamin (MULTI-VITAMINS) TABS Take by mouth.   Past Week at Unknown time  . ondansetron (ZOFRAN) 4 MG/5ML solution Take by mouth.   Taking  . pantoprazole (PROTONIX) 40 MG tablet Take 1 tablet (40 mg total) by mouth daily. 30 tablet 1 02/18/2017 at Unknown time  . polyethylene glycol  (MIRALAX / GLYCOLAX) packet Take 17 g by mouth daily. 14 each 0 02/18/2017 at Unknown time  . traZODone (DESYREL) 50 MG tablet Take 1 tablet (50 mg total) by mouth at bedtime as needed for sleep. 30 tablet 1 Past Week at Unknown time  . valACYclovir (VALTREX) 1000 MG tablet Take by mouth.   Taking    Patient Stressors: Medication change or noncompliance  Patient Strengths: Capable of independent living Communication skills Supportive family/friends  Treatment Modalities: Medication Management, Group therapy, Case management,  1 to 1 session with clinician, Psychoeducation, Recreational therapy.   Physician Treatment Plan for Primary Diagnosis: Undifferentiated schizophrenia (Kristina Li) Long Term Goal(s): Improvement in symptoms so as ready for discharge NA   Short Term Goals: Ability to identify changes in lifestyle to reduce recurrence of condition will improve Ability to verbalize feelings will improve Ability to disclose and discuss suicidal ideas Ability to demonstrate self-control will improve Ability to identify and develop effective coping behaviors will improve Compliance with prescribed medications will improve Ability to identify triggers associated with substance abuse/mental health issues will improve NA  Medication Management: Evaluate patient's response, side effects, and tolerance of medication regimen.  Therapeutic Interventions: 1 to 1 sessions, Unit Group sessions and Medication administration.  Evaluation of Outcomes: Progressing  Physician Treatment Plan for Secondary Diagnosis: Principal Problem:   Undifferentiated schizophrenia (Kristina Li) Active Problems:   Thyroid cancer (Kristina Li)   Gastroesophageal reflux disease   Fibromyalgia   HTN (hypertension)   COPD (chronic obstructive pulmonary disease) (Kristina Li)  Long Term Goal(s): Improvement in symptoms so as ready for discharge NA   Short Term Goals: Ability to identify changes in lifestyle to reduce recurrence of  condition will improve Ability to verbalize feelings will improve Ability to disclose and discuss suicidal ideas Ability to demonstrate self-control will improve Ability to identify and develop effective coping behaviors will improve Compliance with prescribed medications will improve Ability to identify triggers associated with substance abuse/mental health issues will improve NA     Medication Management: Evaluate patient's response, side effects, and tolerance of medication regimen.  Therapeutic Interventions: 1 to 1 sessions, Unit Group sessions and Medication administration.  Evaluation of Outcomes: Progressing   RN Treatment Plan for Primary Diagnosis: Undifferentiated schizophrenia ([place]) Long Term Goal(s): Knowledge of disease and therapeutic regimen to maintain health will improve  Short Term Goals: Ability to identify and develop effective coping behaviors will improve and Compliance with prescribed medications will improve  Medication Management: RN will administer medications as ordered by provider, will assess and evaluate patient's response and provide education to patient for prescribed medication. RN will report any adverse and/or side effects to prescribing provider.  Therapeutic Interventions: 1  on 1 counseling sessions, Psychoeducation, Medication administration, Evaluate responses to treatment, Monitor vital signs and CBGs as ordered, Perform/monitor CIWA, COWS, AIMS and Fall Risk screenings as ordered, Perform wound care treatments as ordered.  Evaluation of Outcomes: Progressing   LCSW Treatment Plan for Primary Diagnosis: Undifferentiated schizophrenia (Kristina [place]) Long Term Goal(s): Safe transition to appropriate next level of care at discharge, Engage patient in therapeutic group addressing interpersonal concerns.  Short Term Goals: Engage patient in aftercare planning with referrals and resources, Increase social support and Increase emotional  regulation  Therapeutic Interventions: Assess for all discharge needs, 1 to 1 time with Social worker, Explore available resources and support systems, Assess for adequacy in community support network, Educate family and significant other(s) on suicide prevention, Complete Psychosocial Assessment, Interpersonal group therapy.  Evaluation of Outcomes: Progressing    Recreational Therapy Treatment Plan for Primary Diagnosis: Undifferentiated schizophrenia ([place]) Long Term Goal(s): Patient will participate in recreation therapy treatment in at least 2 group sessions without prompting from [place]: Increase healthy decision making skills  Treatment Modalities: Group Therapy and Individual Treatment Sessions  Therapeutic Interventions: Psychoeducation  Evaluation of Outcomes: Progressing   Progress in Treatment: Attending groups: Yes. Participating in groups: Yes. Taking medication as prescribed: Yes. Toleration medication: Yes. Family/Significant other contact made: No, will contact:  CSW assessing proper family contact. Patient understands diagnosis: Yes. Discussing patient identified problems/goals with staff: Yes. Medical problems stabilized or resolved: Yes. Denies suicidal/homicidal ideation: Yes. Issues/concerns per patient self-inventory: No.  New problem(s) identified: No, Describe:  None identified.  New Short Term/Long Term Goal(s): Patient stated her goal is to get her medications adjusted.  Discharge Plan or Barriers: Patient will discharge home and follow-up with [place].  Reason for Continuation of Hospitalization: Depression Hallucinations Medication stabilization  Estimated Length of Stay: 3-5 days   Attendees: Patient: Kristina Li 04/15/2017 1:42 PM  Physician: Dr. Orson Slick, MD  04/15/2017 1:42 PM  Nursing: Elige Radon, BSN, RN  04/15/2017 1:42 PM  RN Care Manager: 04/15/2017 1:42 PM  Social Worker: Glorious Peach, MSW,  LCSW-A 04/15/2017 1:42 PM  Recreational Therapist: Drue Flirt, LRT/CTRS  04/15/2017 1:42 PM  Other: Lurline Idol, LCSW  04/15/2017 1:42 PM  Other:  04/15/2017 1:42 PM  Other: 04/15/2017 1:42 PM    Scribe for Treatment Team: Emilie Rutter, [place] 04/15/2017 1:42 PM

## 2017-04-15 NOTE — Tx Team (Signed)
Initial Treatment Plan 04/15/2017 2:07 AM Kristina Li NWG:[id number]    PATIENT STRESSORS: Medication change or noncompliance   PATIENT STRENGTHS: Capable of independent living Communication skills Supportive family/friends   PATIENT IDENTIFIED PROBLEMS: "Need to get my medicine straight."   "I need help with my depression."  Depression  Anxiety  Psychosis             DISCHARGE CRITERIA:  Ability to meet basic life and health needs Verbal commitment to aftercare and medication compliance  PRELIMINARY DISCHARGE PLAN: Return to previous living arrangement  PATIENT/FAMILY INVOLVEMENT: This treatment plan has been presented to and reviewed with the patient, [name], and/or family member.  The patient and family have been given the opportunity to ask questions and make suggestions.  Leia Alf, RN 04/15/2017, 2:07 AM
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
## 2017-04-15 NOTE — Progress Notes (Signed)
Recreation Therapy Notes  Date: 05.09.18 Time: 9:30 am Location: Craft Room  Group Topic: Self-esteem  Goal Area(s) Addresses:  Patient will write at least one positive trait about self. Patient will verbalize benefit of having a healthy self-esteem.  Behavioral Response: Attentive, Interactive  Intervention: I Am  Activity: Patients were given a worksheet with the letter I on it and were instructed to write as many positive traits about themselves inside the letter.  Education: LRT educated patients on ways to increase their self-esteem.  Education Outcome: Acknowledges education/In group clarification offered  Clinical Observations/Feedback: Patient wrote positive traits about self. Patient contributed to group discussion by stating it was easier to think of positive traits and why, what can make it difficult to think of positive traits, how her self-esteem affects her, and what she can do to increase her self-esteem.  Leonette Monarch, LRT/CTRS 04/15/2017 10:12 AM

## 2017-04-15 NOTE — Progress Notes (Signed)
Admission Note:  51yr female, skin was assessed and found to have multiple tattoos; upper chest , allover right arm, left arm, her neck, lower back and both eyelids, skin warm and intact. PT searched and no contraband found, Pt had no additional questions or concerns, will continue to monitor.

## 2017-04-15 NOTE — Progress Notes (Signed)
Admission Note  Pt is a 36 y/o female admitted onto [facility] I/P blue hall. Pt at the time of admission endorsed moderate anxiety, depression, and generalized body pains from h/o fibromyalgia; states, "I think one of my meds was increased and my depression got worse; as my depression continued to increase I started to becoming more anxious and then the voices increased."-Pt denied AVH at this time. Pt also denied SI or HI. Pt denied any illegal drugs or alcohol use. Support, encouragement, and safe environment provided.  15-minute safety checks initiated and continued. Pt remained calm and cooperative through the admission process. Pt searched and no contrabands found.

## 2017-04-15 NOTE — Plan of Care (Signed)
Problem: Coping: Goal: Ability to demonstrate self-control will improve Outcome: Progressing Appropriate  Behavior  On unit

## 2017-04-16 LAB — HEMOGLOBIN A1C
Hgb A1c MFr Bld: 5.5 % (ref 4.8–5.6)
Mean Plasma Glucose: 111 mg/dL

## 2017-04-16 MED ORDER — LIDOCAINE 5 % EX PTCH: 1.0000 | MEDICATED_PATCH | CUTANEOUS | 1 refills | Status: DC

## 2017-04-16 MED ORDER — ETODOLAC 200 MG PO CAPS: 200.0000 mg | ORAL_CAPSULE | Freq: Two times a day (BID) | ORAL | 1 refills | Status: DC

## 2017-04-16 MED ORDER — TRAZODONE HCL 100 MG PO TABS: 100.0000 mg | ORAL_TABLET | Freq: Every day | ORAL | 1 refills | Status: DC

## 2017-04-16 MED ORDER — TRAZODONE HCL 100 MG PO TABS
100.0000 mg | ORAL_TABLET | Freq: Every day | ORAL | Status: DC
  Administered 2017-04-16: 100 mg via ORAL
  Filled 2017-04-16: qty 1

## 2017-04-16 MED ORDER — AMITRIPTYLINE HCL 50 MG PO TABS
50.0000 mg | ORAL_TABLET | Freq: Every day | ORAL | Status: DC
  Administered 2017-04-16: 50 mg via ORAL
  Filled 2017-04-16: qty 1

## 2017-04-16 MED ORDER — ARIPIPRAZOLE 30 MG PO TABS: 30.0000 mg | ORAL_TABLET | Freq: Every day | ORAL | 1 refills | Status: DC

## 2017-04-16 NOTE — Progress Notes (Signed)
Patient continues with somatic complaints of pain. Denies SI, HI, AVH. Complaints of anxiety. Med and group compliant. Encouragement and support offered. Safety checks maintained. Pt receptive and remains safe on unit with q 15 min checks.

## 2017-04-16 NOTE — B[facility] Group Notes (Signed)
[facility] Group Notes:  (Nursing/MHT/Case Management/Adjunct)  Date:  04/16/2017  Time:  6:01 PM  Type of Therapy:  Psychoeducational Skills  Participation Level:  Active  Participation Quality:  Appropriate, Attentive and Sharing  Affect:  Appropriate  Cognitive:  Alert and Appropriate  Insight:  Appropriate  Engagement in Group:  Engaged  Modes of Intervention:  Discussion, Education and Support  Summary of Progress/Problems:  Kristina Li 04/16/2017, 6:01 PM

## 2017-04-16 NOTE — BHH Group Notes (Signed)
[facility] LCSW Group Therapy Note  Type of Therapy and Topic:  Group Therapy:  Goals Group: SMART Goals  Participation Level:  Patient attended group but did not participate during group.   Description of Group:   The purpose of a daily goals group is to assist and guide patients in setting recovery/wellness-related goals.  The objective is to set goals as they relate to the crisis in which they were admitted. Patients will be using SMART goal modalities to set measurable goals.  Characteristics of realistic goals will be discussed and patients will be assisted in setting and processing how one will reach their goal. Facilitator will also assist patients in applying interventions and coping skills learned in psycho-education groups to the SMART goal and process how one will achieve defined goal.  Therapeutic Goals: -Patients will develop and document one goal related to or their crisis in which brought them into treatment. -Patients will be guided by LCSW using SMART goal setting modality in how to set a measurable, attainable, realistic and time sensitive goal.  -Patients will process barriers in reaching goal. -Patients will process interventions in how to overcome and successful in reaching goal.   Summary of Patient Progress:  Patient Goal: Patient did not participate in group discussion.    Therapeutic Modalities:   Motivational Interviewing Public relations account executive Therapy Crisis Intervention Model SMART goals setting  Tylar Merendino G. [place], [facility] 04/16/2017 10:44 AM

## 2017-04-16 NOTE — Progress Notes (Signed)
D: Pt denies SI/HI/AVH. Pt is pleasant and cooperative, affect is bright.  Patient's thoughts are organized, she appears less anxious and she is interacting with peers and staff appropriately. No bizarre behavior noted.  A: Pt was offered support and encouragement. Pt was given scheduled medications. Pt was encouraged to attend groups. Q 15 minute checks were done for safety.  R:Pt attends groups and interacts well with peers and staff. Pt is taking medication. Pt has no complaints.Pt receptive to treatment and safety maintained on unit.

## 2017-04-16 NOTE — BHH Group Notes (Signed)
[facility] LCSW Group Therapy  04/16/2017 1:32 PM  Type of Therapy:  Group Therapy  Participation Level:  Patient did not attend group. CSW invited patient to group.   Summary of Progress/Problems: Balance in life: Patients will discuss the concept of balance and how it looks and feels to be unbalanced. Pt will identify areas in their life that is unbalanced and ways to become more balanced. They discussed what aspects in their lives has influenced their self care. Patients also discussed self care in the areas of self regulation/control, hygiene/appearance, sleep/relaxation, healthy leisure, healthy eating habits, exercise, inner peace/spirituality, self improvement, sobriety, and health management. They were challenged to identify changes that are needed in order to improve self care.  Siniya Lichty G. [place], [place] 04/16/2017, 1:36 PM

## 2017-04-16 NOTE — Plan of Care (Signed)
Problem: [facility] Participation in Recreation Therapeutic Interventions Goal: STG-Other Recreation Therapy Goal (Specify) STG: Decision Making - Within 4 treatment sessions, patient will verbalize understanding of the decision making charts in each of 2 treatment sessions to increase healthy decision making skills.  Outcome: Progressing Treatment Session 1; Completed 1 out of 2: At approximately 11:05 am, LRT met with patient in patient room. LRT educated and provided patient with decision making charts. Patient verbalized understanding. LRT encouraged patient to use the charts to help her make better decisions.  Leonette Monarch, LRT/CTRS 05.10.18 3:15 pm

## 2017-04-16 NOTE — Progress Notes (Signed)
Recreation Therapy Notes  Date: 05.10.18 Time: 9:30 am Location: Craft Room  Group Topic: Leisure Education  Goal Area(s) Addresses:  Patient will identify things they are grateful for. Patient will identify how being grateful can influence decision making.  Behavioral Response: Attentive, Interactive  Intervention: Grateful Wheel  Activity: Patients were given an I Am Grateful For worksheet and were instructed to write things they were grateful for under each category.  Education: LRT educated patients on leisure.  Education Outcome: Acknowledges education/In group clarification offered  Clinical Observations/Feedback: Patient wrote things she is grateful for. Patient contributed to group discussion by stating things she is grateful for, what category had more things she is grateful for it in, and how being aware of what she is grateful for can influence her decision making skills.  Leonette Monarch, LRT/CTRS 04/16/2017 10:17 AM

## 2017-04-16 NOTE — BHH Group Notes (Signed)
[place] [facility] Group Notes:  (Nursing/MHT/Case Management/Adjunct)  Date:  04/16/2017  Time:  9:42 PM  Type of Therapy:  Group Therapy  Participation Level:  Active  Participation Quality:  Appropriate  Affect:  Appropriate  Cognitive:  Appropriate  Insight:  Good  Engagement in Group:  Engaged  Modes of Intervention:  Support  Summary of Progress/Problems:  Kristina Li 04/16/2017, 9:42 PM

## 2017-04-16 NOTE — Plan of Care (Signed)
Problem: Safety: Goal: Ability to remain free from injury will improve Outcome: Not Progressing No injury reported or observed
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
## 2017-04-17 NOTE — Progress Notes (Signed)
Patient denies SI/HI, denies A/V hallucinations. Patient verbalizes understanding of discharge instructions, follow up care and prescriptions. Patient given all belongings from [facility] locker. Patient escorted out by staff, transported by family. 

## 2017-04-17 NOTE — Plan of Care (Signed)
Problem: [facility] Participation in Recreation Therapeutic Interventions Goal: STG-Other Recreation Therapy Goal (Specify) STG: Decision Making - Within 4 treatment sessions, patient will verbalize understanding of the decision making charts in each of 2 treatment sessions to increase healthy decision making skills.  Outcome: Completed/Met Date Met: 04/17/17 Treatment Session 2; Completed 2 out of 2: At approximately 8:45 am, LRT met with patient in patient room. Patient verbalized understanding of the decision making charts. LRT encouraged patient to use the charts to help her make better decisions.  Leonette Monarch, LRT/CTRS 05.11.18 11:53 am

## 2017-04-17 NOTE — Progress Notes (Signed)
Recreation Therapy Notes  INPATIENT RECREATION TR PLAN  Patient Details Name: Kristina Li MRN: [id number] DOB: 03-19-1981 Today's Date: 04/17/2017  Rec Therapy Plan Is patient appropriate for Therapeutic Recreation?: Yes Treatment times per week: At least once a week TR Treatment/Interventions: 1:1 session, Group participation (Comment) (Appropriate particpation in daily recreational therapy tx)  Discharge Criteria Pt will be discharged from therapy if:: Treatment goals are met, Discharged Treatment plan/goals/alternatives discussed and agreed upon by:: Patient/family  Discharge Summary Short term goals set: See Care Plan Short term goals met: Complete Progress toward goals comments: One-to-one attended Which groups?: Coping skills, Leisure education, Self-esteem One-to-one attended: Decision making Reason goals not met: N/A Therapeutic equipment acquired: None Reason patient discharged from therapy: Discharge from hospital Pt/family agrees with progress & goals achieved: Yes Date patient discharged from therapy: 04/17/17   Leonette Monarch, LRT/CTRS 04/17/2017, 11:57 AM

## 2017-04-17 NOTE — Progress Notes (Signed)
  [facility] Adult Case Management Discharge Plan :  Will you be returning to the same living situation after discharge:  Yes,  home with family. At discharge, do you have transportation home?: Yes, link bus. Do you have the ability to pay for your medications: Yes, Medicaid.   Release of information consent forms completed and in the chart;  Patient's signature needed at discharge.  Patient to Follow up at: Follow-up Information    [place] on 04/23/2017.   Why:  Please follow up with Dr. Lita Mains on May 17th at 11:30 AM. Please bring your dischage paperwork to this appointment. If you have questions or concerns, contact [facility] directly.  Contact information: [place] [address] [phone]           Next level of care provider has access to [place] and Suicide Prevention discussed: Yes,  SPE completed with mother.  Have you used any form of tobacco in the last 30 days? (Cigarettes, Smokeless Tobacco, Cigars, and/or Pipes): No  Has patient been referred to the Quitline?: N/A patient is not a smoker  Patient has been referred for addiction treatment: [place], MSW, LCSW-A 04/17/2017, 10:20 AM

## 2017-04-17 NOTE — Progress Notes (Signed)
Recreation Therapy Notes  Date: 05.11.18 Time: 9:30 am Location: Craft Room  Group Topic: Coping Skills  Goal Area(s) Addresses:  Patient will participate in healthy coping skill. Patient will verbalize what makes art a good coping skill.  Behavioral Response: Attentive, Interactive  Intervention: Coloring  Activity: Patients were given coloring sheets to color and were instructed to think of what emotions they were feeling as well as what their minds were focused on.  Education: LRT educated patients on healthy coping skills.  Education Outcome: Acknowledges education/In group clarification offered  Clinical Observations/Feedback: Patient colored coloring sheet. Patient left group at approximately 9:43 am with nursing. Patient returned to group at approximately 10:03 am. Patient contributed to group discussion by stating what makes art a good coping skill and what emotions she felt during group.  Leonette Monarch, LRT/CTRS 04/17/2017 10:25 AM

## 2017-04-17 NOTE — [facility] Suicide Risk Assessment (Signed)
[facility] Discharge Suicide Risk Assessment   Principal Problem: Undifferentiated schizophrenia [facility]) Discharge Diagnoses:  Patient Active Problem List   Diagnosis Date Noted  . Undifferentiated schizophrenia ([place]) [F20.3] 04/15/2017  . Rectal polyp [K62.1]   . First degree hemorrhoids [K64.0]   . Hemorrhage of rectum and anus [K62.5]   . Major depressive disorder, recurrent, severe with psychotic features ([place]) [F33.3] 08/15/2016  . HTN (hypertension) [I10] 08/15/2016  . COPD (chronic obstructive pulmonary disease) ([place]) [J44.9] 08/15/2016  . 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 DDD (degenerative disc disease), lumbar [M51.36] 03/18/2016  . Facet syndrome, lumbar ([place]) [M46.96] 03/18/2016  . Lumbar radiculopathy [M54.16] 03/18/2016  . 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 Cervicalgia [M54.2] 01/31/2016  . Sacroiliac joint dysfunction [M53.3] 01/31/2016  . Myofascial pain [M79.1] 01/31/2016  . Fibromyalgia [M79.7] 01/31/2016  . Bilateral occipital neuralgia [M54.81] 01/31/2016  . Thyroid cancer ([place]) [C73]   . Post traumatic stress disorder (PTSD) [F43.10]   . Gastroesophageal reflux disease [K21.9]   . Endometriosis [N80.9]     Total Time spent with patient: 30 minutes  Musculoskeletal: Strength & Muscle Tone: within normal limits Gait & Station: normal Patient leans: N/A  Psychiatric Specialty Exam: Review of Systems  Musculoskeletal: Positive for back pain, joint pain and myalgias.  Psychiatric/Behavioral: The patient has insomnia.   All other systems reviewed and are negative.   Blood pressure 113/64, pulse 94, temperature 99.1 F (37.3 C), temperature source Oral, resp. rate 16, height 5\' 7"  (1.702 m), weight 78 kg (172 lb), SpO2 99 %.Body mass index is 26.94 kg/m.  General Appearance: Casual  Eye Contact::  Good  Speech:  Clear and Coherent409  Volume:  Normal  Mood:  Euthymic  Affect:  Appropriate  Thought Process:  Goal Directed and Descriptions of  Associations: Intact  Orientation:  Full (Time, Place, and Person)  Thought Content:  WDL  Suicidal Thoughts:  No  Homicidal Thoughts:  No  Memory:  Immediate;   Fair Recent;   Fair Remote;   Fair  Judgement:  Fair  Insight:  Present  Psychomotor Activity:  Decreased  Concentration:  Fair  Recall:  [employer] of Knowledge:Fair  Language: Fair  Akathisia:  No  Handed:  Right  AIMS (if indicated):     Assets:  Communication Skills Desire for Improvement Financial Resources/Insurance Housing Resilience Social Support  Sleep:  Number of Hours: 3.15  Cognition: WNL  ADL's:  Intact   Mental Status Per Nursing Assessment::   On Admission:     Demographic Factors:  NA  Loss Factors: Decline in physical health  Historical Factors: Prior suicide attempts, Family history of mental illness or substance abuse and Impulsivity  Risk Reduction Factors:   Sense of responsibility to family, Living with another person, especially a relative, Positive social support and Positive therapeutic relationship  Continued Clinical Symptoms:  Schizophrenia:   Less than 26 years old Chronic Pain Medical Diagnoses and Treatments/Surgeries  Cognitive Features That Contribute To Risk:  None    Suicide Risk:  Minimal: No identifiable suicidal ideation.  Patients presenting with no risk factors but with morbid ruminations; may be classified as minimal risk based on the severity of the depressive symptoms  Follow-up Information    [place] on 04/23/2017.   Why:  Please follow up with Dr. Lita Mains on May 17th at 11:30 AM. Please bring your dischage paperwork to this appointment. If you have questions or concerns, contact [facility] directly.  Contact information: [phone] [place] [address] [phone]           Plan Of Care/Follow-up recommendations:  Activity:  As tolerated. Diet:  Low sodium heart healthy. Other:  Keep follow-up appointments.  Orson Slick, MD 04/17/2017, 8:24 AM

## 2017-04-19 DIAGNOSIS — I10 Essential (primary) hypertension: Secondary | ICD-10-CM | POA: Insufficient documentation

## 2017-04-19 DIAGNOSIS — F1290 Cannabis use, unspecified, uncomplicated: Secondary | ICD-10-CM | POA: Insufficient documentation

## 2017-04-19 DIAGNOSIS — E039 Hypothyroidism, unspecified: Secondary | ICD-10-CM | POA: Insufficient documentation

## 2017-04-19 DIAGNOSIS — Z79899 Other long term (current) drug therapy: Secondary | ICD-10-CM | POA: Diagnosis not present

## 2017-04-19 DIAGNOSIS — R1084 Generalized abdominal pain: Secondary | ICD-10-CM | POA: Diagnosis present

## 2017-04-19 DIAGNOSIS — Z85850 Personal history of malignant neoplasm of thyroid: Secondary | ICD-10-CM | POA: Diagnosis not present

## 2017-04-19 DIAGNOSIS — R1013 Epigastric pain: Secondary | ICD-10-CM | POA: Insufficient documentation

## 2017-04-19 DIAGNOSIS — J45909 Unspecified asthma, uncomplicated: Secondary | ICD-10-CM | POA: Insufficient documentation

## 2017-04-19 DIAGNOSIS — R110 Nausea: Secondary | ICD-10-CM | POA: Diagnosis not present

## 2017-04-19 LAB — URINALYSIS, COMPLETE (UACMP) WITH MICROSCOPIC
Bilirubin Urine: NEGATIVE
GLUCOSE, UA: NEGATIVE mg/dL
Ketones, ur: NEGATIVE mg/dL
Leukocytes, UA: NEGATIVE
NITRITE: NEGATIVE
PROTEIN: NEGATIVE mg/dL
SPECIFIC GRAVITY, URINE: 1.008 (ref 1.005–1.030)
pH: 7 (ref 5.0–8.0)

## 2017-04-19 LAB — COMPREHENSIVE METABOLIC PANEL
ALBUMIN: 3.8 g/dL (ref 3.5–5.0)
ALK PHOS: 50 U/L (ref 38–126)
ALT: 12 U/L — AB (ref 14–54)
AST: 18 U/L (ref 15–41)
Anion gap: 8 (ref 5–15)
BILIRUBIN TOTAL: 0.2 mg/dL — AB (ref 0.3–1.2)
BUN: 9 mg/dL (ref 6–20)
CALCIUM: 9.1 mg/dL (ref 8.9–10.3)
CO2: 27 mmol/L (ref 22–32)
Chloride: 105 mmol/L (ref 101–111)
Creatinine, Ser: 0.94 mg/dL (ref 0.44–1.00)
GFR calc Af Amer: >60 mL/min (ref >60)
GFR calc non Af Amer: >60 mL/min (ref >60)
GLUCOSE: 96 mg/dL (ref 65–99)
Potassium: 3.7 mmol/L (ref 3.5–5.1)
SODIUM: 140 mmol/L (ref 135–145)
TOTAL PROTEIN: 7.1 g/dL (ref 6.5–8.1)

## 2017-04-19 LAB — CBC
HCT: 32.2 % — ABNORMAL LOW (ref 35.0–47.0)
Hemoglobin: 10.4 g/dL — ABNORMAL LOW (ref 12.0–16.0)
MCH: 25 pg — AB (ref 26.0–34.0)
MCHC: 32.3 g/dL (ref 32.0–36.0)
MCV: 77.5 fL — ABNORMAL LOW (ref 80.0–100.0)
Platelets: 259 10*3/uL (ref 150–440)
RBC: 4.16 MIL/uL (ref 3.80–5.20)
RDW: 16.1 % — AB (ref 11.5–14.5)
WBC: 6.4 10*3/uL (ref 3.6–11.0)

## 2017-04-19 LAB — POCT PREGNANCY, URINE: PREG TEST UR: NEGATIVE

## 2017-04-19 LAB — LIPASE, BLOOD: Lipase: 28 U/L (ref 11–51)

## 2017-04-19 NOTE — ED Triage Notes (Signed)
Pt here today with some abd discomfort, states that she "feels like something is moving in her belly" pt also states that for the past week the right side of her head feels like the vessels are jumping and throbbing" pt reports pain in her left shoulder but that with her fibromyalgia she sometimes has pain there. Pt appears flat in her affect

## 2017-04-20 ENCOUNTER — Emergency Department
Admission: EM | Admit: 2017-04-20 | Discharge: 2017-04-20 | Disposition: A | Discharge status: Home / Self Care | Payer: Medicaid Other | Attending: Emergency Medicine | Admitting: Emergency Medicine

## 2017-04-20 DIAGNOSIS — R1011 Right upper quadrant pain: Secondary | ICD-10-CM

## 2017-04-20 DIAGNOSIS — R1013 Epigastric pain: Secondary | ICD-10-CM

## 2017-04-20 MED ORDER — GI COCKTAIL ~~LOC~~
ORAL | Status: AC
  Filled 2017-04-20: qty 30

## 2017-04-20 MED ORDER — GI COCKTAIL ~~LOC~~
30.0000 mL | Freq: Once | ORAL | Status: AC
  Administered 2017-04-20: 30 mL via ORAL

## 2017-04-20 MED ORDER — ACETAMINOPHEN 500 MG PO TABS
1000.0000 mg | ORAL_TABLET | Freq: Once | ORAL | Status: AC
  Administered 2017-04-20: 1000 mg via ORAL
  Filled 2017-04-20: qty 2

## 2017-04-20 MED ORDER — FAMOTIDINE 40 MG PO TABS: 40.0000 mg | ORAL_TABLET | Freq: Every evening | ORAL | 0 refills | Status: DC

## 2017-04-20 NOTE — Discharge Instructions (Signed)
Please make an appointment to follow-up with your primary care physician in 2 days for recheck. Return to the emergency department sooner for any new or worsening symptoms such as if you cannot eat or drink or for any other concerns.  It was a pleasure to take care of you today, and thank you for coming to our emergency department.  If you have any questions or concerns before leaving please ask the nurse to grab me and I'm more than happy to go through your aftercare instructions again.  If you were prescribed any opioid pain medication today such as Norco, Vicodin, Percocet, morphine, hydrocodone, or oxycodone please make sure you do not drive when you are taking this medication as it can alter your ability to drive safely.  If you have any concerns once you are home that you are not improving or are in fact getting worse before you can make it to your follow-up appointment, please do not hesitate to call [phone] and come back for further evaluation.  Darel Hong MD  Results for orders placed or performed during the hospital encounter of 04/20/17  Lipase, blood  Result Value Ref Range   Lipase 28 11 - 51 U/L  Comprehensive metabolic panel  Result Value Ref Range   Sodium 140 135 - 145 mmol/L   Potassium 3.7 3.5 - 5.1 mmol/L   Chloride 105 101 - 111 mmol/L   CO2 27 22 - 32 mmol/L   Glucose, Bld 96 65 - 99 mg/dL   BUN 9 6 - 20 mg/dL   Creatinine, Ser 0.94 0.44 - 1.00 mg/dL   Calcium 9.1 8.9 - 10.3 mg/dL   Total Protein 7.1 6.5 - 8.1 g/dL   Albumin 3.8 3.5 - 5.0 g/dL   AST 18 15 - 41 U/L   ALT 12 (L) 14 - 54 U/L   Alkaline Phosphatase 50 38 - 126 U/L   Total Bilirubin 0.2 (L) 0.3 - 1.2 mg/dL   GFR calc non Af Amer >60 >60 mL/min   GFR calc Af Amer >60 >60 mL/min   Anion gap 8 5 - 15  CBC  Result Value Ref Range   WBC 6.4 3.6 - 11.0 K/uL   RBC 4.16 3.80 - 5.20 MIL/uL   Hemoglobin 10.4 (L) 12.0 - 16.0 g/dL   HCT 32.2 (L) 35.0 - 47.0 %   MCV 77.5 (L) 80.0 - 100.0 fL   MCH 25.0 (L)  26.0 - 34.0 pg   MCHC 32.3 32.0 - 36.0 g/dL   RDW 16.1 (H) 11.5 - 14.5 %   Platelets 259 150 - 440 K/uL  Urinalysis, Complete w Microscopic  Result Value Ref Range   Color, Urine YELLOW (A) YELLOW   APPearance HAZY (A) CLEAR   Specific Gravity, Urine 1.008 1.005 - 1.030   pH 7.0 5.0 - 8.0   Glucose, UA NEGATIVE NEGATIVE mg/dL   Hgb urine dipstick MODERATE (A) NEGATIVE   Bilirubin Urine NEGATIVE NEGATIVE   Ketones, ur NEGATIVE NEGATIVE mg/dL   Protein, ur NEGATIVE NEGATIVE mg/dL   Nitrite NEGATIVE NEGATIVE   Leukocytes, UA NEGATIVE NEGATIVE   RBC / HPF 0-5 0 - 5 RBC/hpf   WBC, UA 0-5 0 - 5 WBC/hpf   Bacteria, UA RARE (A) NONE SEEN   Squamous Epithelial / LPF 0-5 (A) NONE SEEN   Mucous PRESENT   Pregnancy, urine POC  Result Value Ref Range   Preg Test, Ur NEGATIVE NEGATIVE
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
## 2017-05-05 ENCOUNTER — Other Ambulatory Visit: Payer: Self-pay | Admitting: Ophthalmology

## 2017-05-05 DIAGNOSIS — R519 Headache, unspecified: Secondary | ICD-10-CM

## 2017-05-05 DIAGNOSIS — R51 Headache: Principal | ICD-10-CM

## 2017-05-14 ENCOUNTER — Ambulatory Visit
Admission: RE | Admit: 2017-05-14 | Discharge: 2017-05-14 | Disposition: A | Discharge status: Home / Self Care | Payer: Medicaid Other | Source: Ambulatory Visit | Attending: Ophthalmology | Admitting: Ophthalmology

## 2017-05-14 DIAGNOSIS — R51 Headache: Secondary | ICD-10-CM | POA: Insufficient documentation

## 2017-05-14 DIAGNOSIS — R519 Headache, unspecified: Secondary | ICD-10-CM

## 2017-05-14 MED ORDER — GADOBENATE DIMEGLUMINE 529 MG/ML IV SOLN
12.0000 mL | Freq: Once | INTRAVENOUS | Status: AC | PRN
  Administered 2017-05-14: 12 mL via INTRAVENOUS

## 2017-11-19 IMAGING — MR MR LUMBAR SPINE W/O CM
4 of 5 series · 15 of 48 positions shown · non-contrast
Comparison: Lumbar spine radiographs 02/25/2016. CT abdomen and
pelvis 08/26/2010.

CLINICAL DATA: Low back and bilateral leg pain for 1 year. Numbness
and tingling in the right leg.

EXAM:
MRI LUMBAR SPINE WITHOUT CONTRAST
TECHNIQUE: Multiplanar, multisequence MR imaging of the lumbar spine was
performed. No intravenous contrast was administered.

[Series 2: T2 · sagittal · 4.0mm · 0.44mm/px · 6 of 16 slices shown (1 of 2)]
[im 1/16]
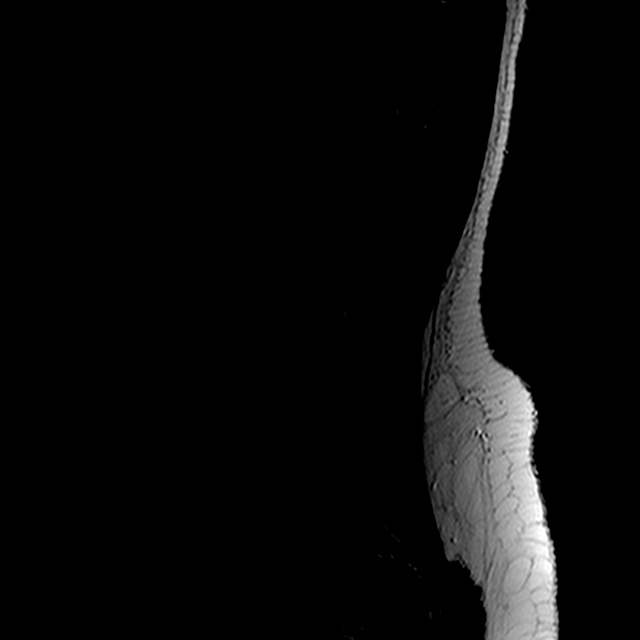
[im 4/16]
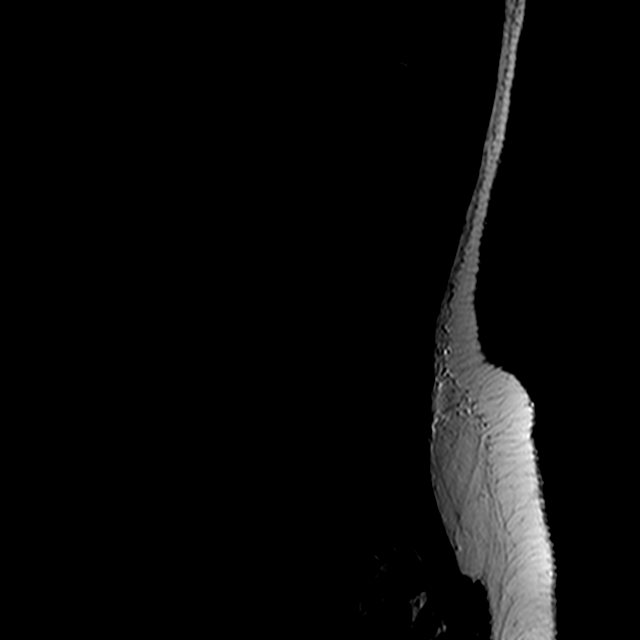
[im 7/16]
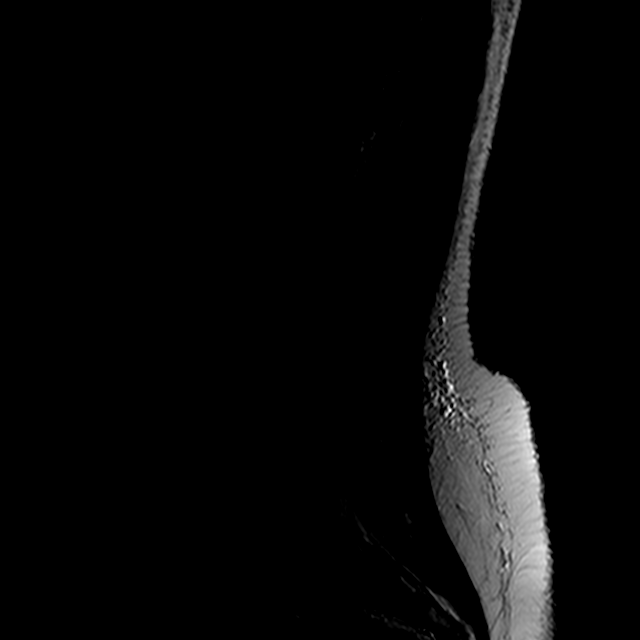
[im 10/16]
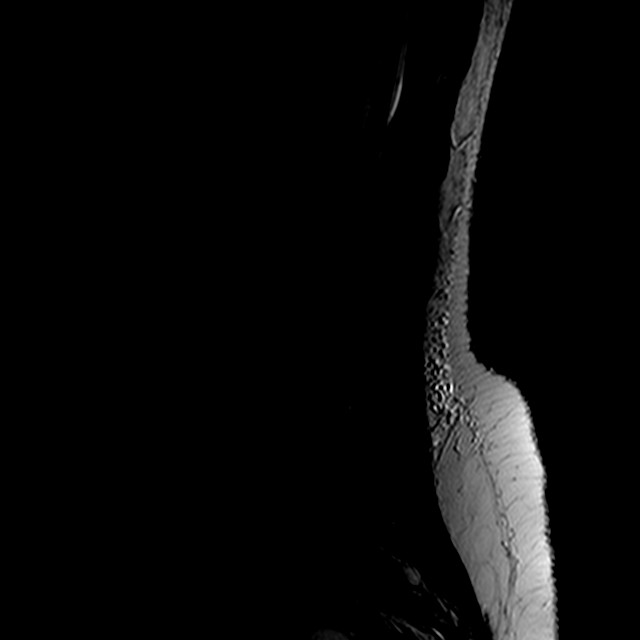
[im 13/16]
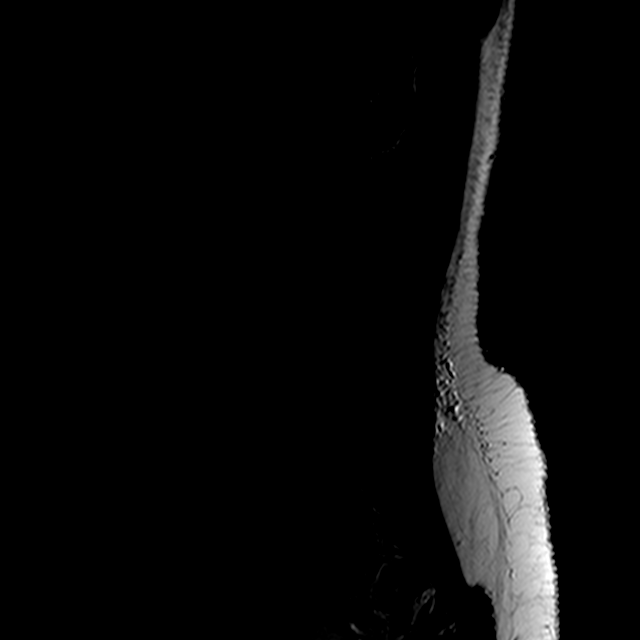
[im 16/16]
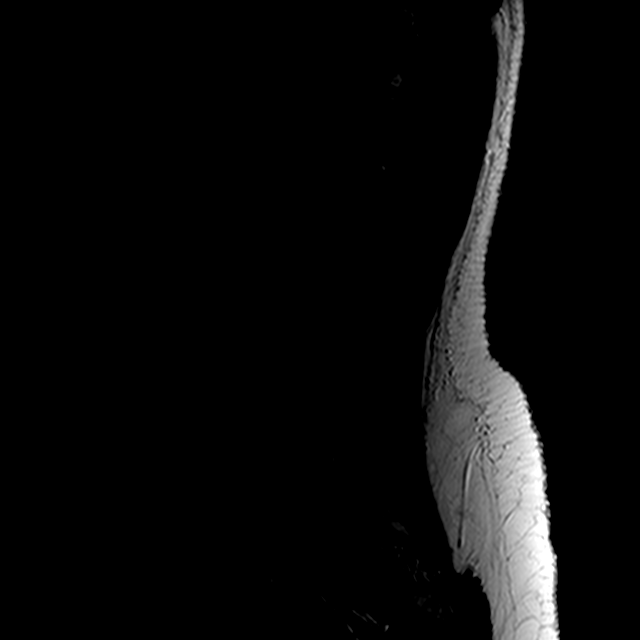

[Series 3: T1 · sagittal · 4.0mm · 0.44mm/px · 3 of 16 slices shown (1 of 2)]
[im 4/16]
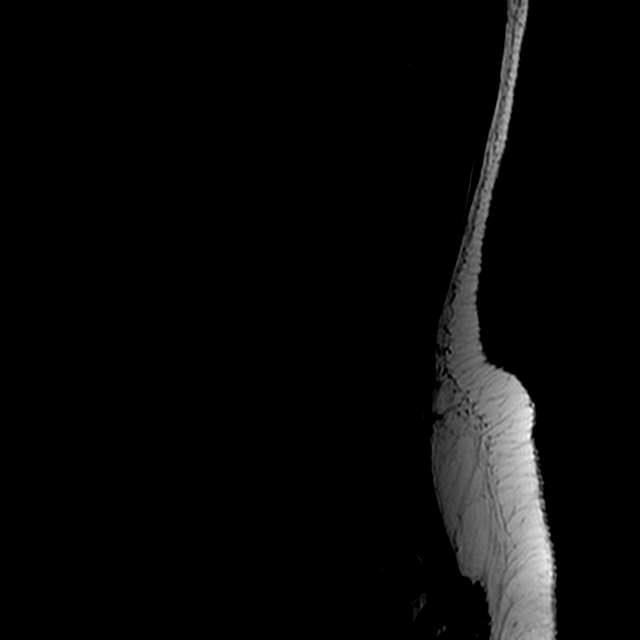
[im 10/16]
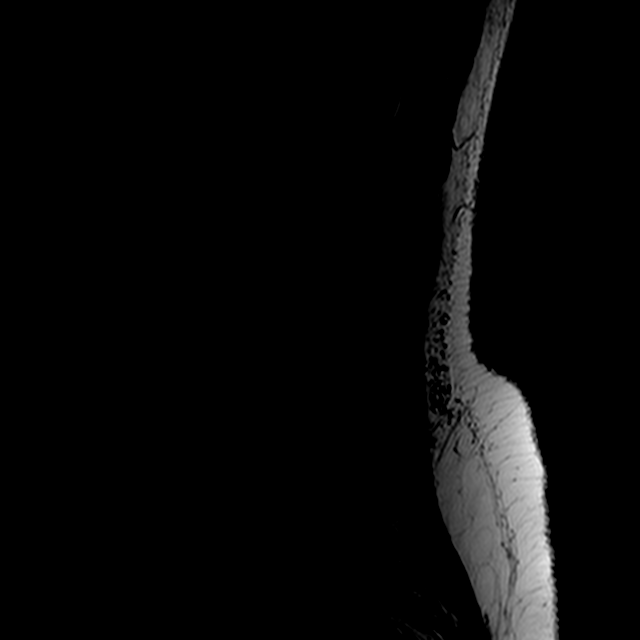
[im 16/16]
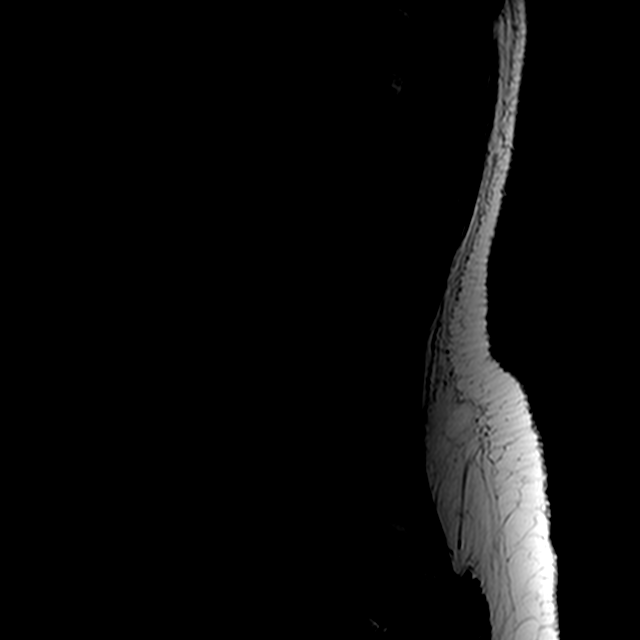

[Series 5: T2 · axial · 4.0mm · 0.39mm/px · z∈[-98,+73]mm · 3 of 40 slices shown (2 of 2)]
[im 6/40]
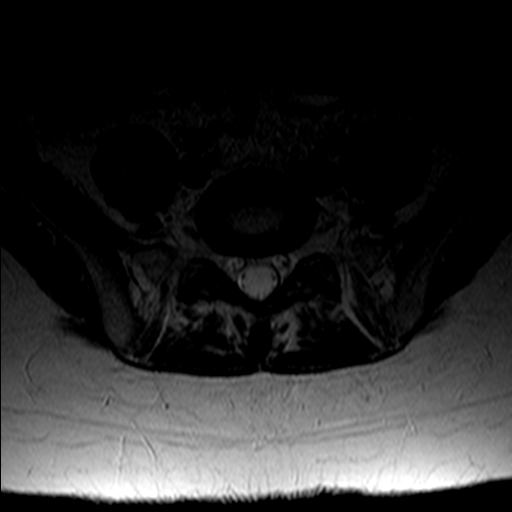
[im 20/40]
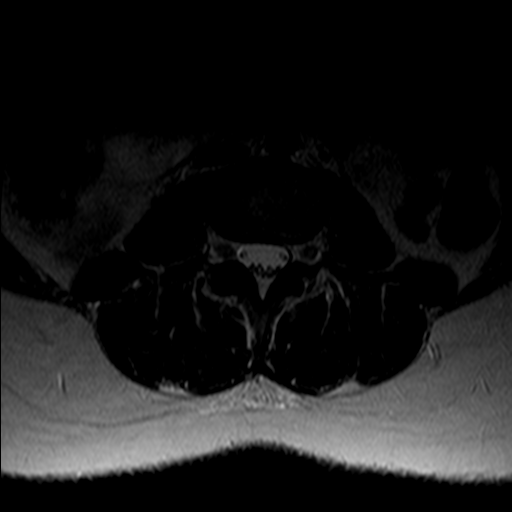
[im 34/40]
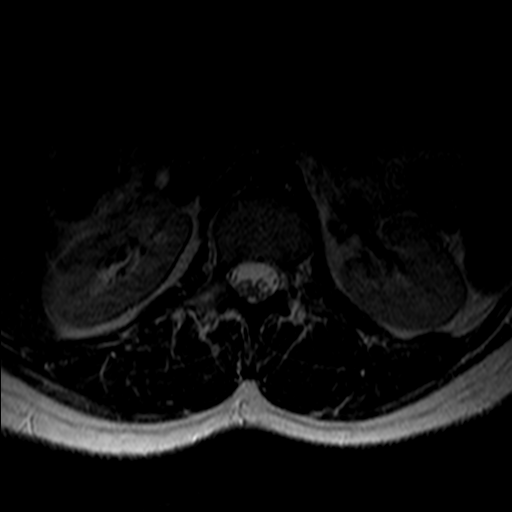

[Series 6: T1 · axial · 4.0mm · 0.39mm/px · z∈[-98,+73]mm · 3 of 40 slices shown (2 of 2)]
[im 6/40]
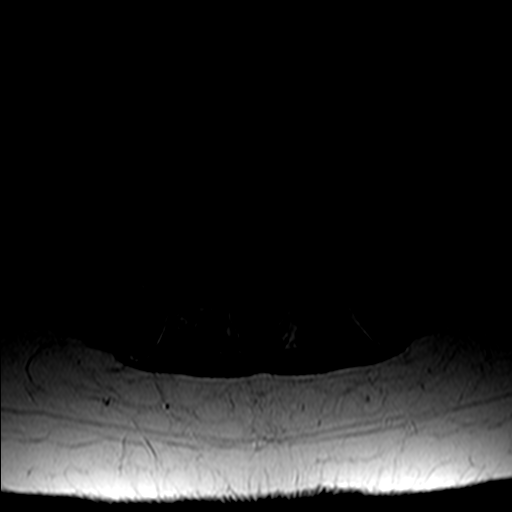
[im 20/40]
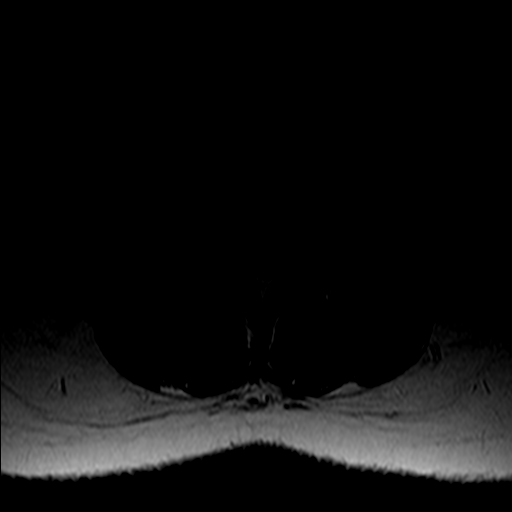
[im 34/40]
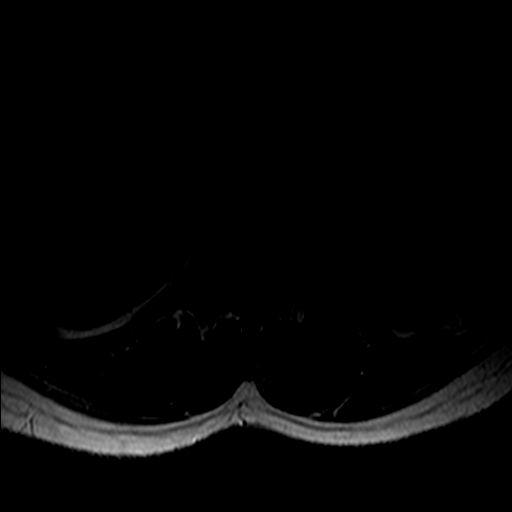

[15 of 48 positions shown; findings below may reference images not displayed]

FINDINGS: Normal lumbar segmentation demonstrated on comparison studies.

Mild lumbar levoscoliosis is again seen with apex at L3. There is no
listhesis. Vertebral body heights are preserved. Intervertebral
discs are well hydrated and maintained in height. No significant
vertebral marrow edema is seen. The conus medullaris is normal in
signal and terminates at L1-2. Paraspinal soft tissues are
unremarkable.

There is mild lower lumbar facet arthrosis bilaterally from L3-4 to
L5-S1, greatest at L4-5. No disc herniation, spinal stenosis, or
neural foraminal stenosis is identified.
IMPRESSION: 1. Mild lower lumbar facet arthrosis. No disc herniation or
stenosis.
2. Mild lumbar levoscoliosis.

## 2017-12-23 ENCOUNTER — Other Ambulatory Visit: Payer: Self-pay

## 2017-12-23 ENCOUNTER — Emergency Department
Admission: EM | Admit: 2017-12-23 | Discharge: 2017-12-23 | Disposition: A | Discharge status: Home / Self Care | Payer: Medicaid Other | Attending: Emergency Medicine | Admitting: Emergency Medicine

## 2017-12-23 ENCOUNTER — Emergency Department: Payer: Medicaid Other

## 2017-12-23 DIAGNOSIS — Z923 Personal history of irradiation: Secondary | ICD-10-CM | POA: Insufficient documentation

## 2017-12-23 DIAGNOSIS — Z85850 Personal history of malignant neoplasm of thyroid: Secondary | ICD-10-CM | POA: Insufficient documentation

## 2017-12-23 DIAGNOSIS — M5440 Lumbago with sciatica, unspecified side: Secondary | ICD-10-CM | POA: Insufficient documentation

## 2017-12-23 DIAGNOSIS — M5412 Radiculopathy, cervical region: Secondary | ICD-10-CM | POA: Diagnosis not present

## 2017-12-23 DIAGNOSIS — E039 Hypothyroidism, unspecified: Secondary | ICD-10-CM | POA: Diagnosis not present

## 2017-12-23 DIAGNOSIS — J45909 Unspecified asthma, uncomplicated: Secondary | ICD-10-CM | POA: Insufficient documentation

## 2017-12-23 DIAGNOSIS — M5416 Radiculopathy, lumbar region: Secondary | ICD-10-CM | POA: Insufficient documentation

## 2017-12-23 DIAGNOSIS — G8929 Other chronic pain: Secondary | ICD-10-CM | POA: Insufficient documentation

## 2017-12-23 DIAGNOSIS — Z91040 Latex allergy status: Secondary | ICD-10-CM | POA: Diagnosis not present

## 2017-12-23 DIAGNOSIS — Z79899 Other long term (current) drug therapy: Secondary | ICD-10-CM | POA: Insufficient documentation

## 2017-12-23 DIAGNOSIS — M549 Dorsalgia, unspecified: Secondary | ICD-10-CM | POA: Diagnosis present

## 2017-12-23 DIAGNOSIS — I10 Essential (primary) hypertension: Secondary | ICD-10-CM | POA: Diagnosis not present

## 2017-12-23 LAB — PREGNANCY, URINE: PREG TEST UR: NEGATIVE

## 2017-12-23 MED ORDER — PREDNISONE 50 MG PO TABS: ORAL_TABLET | ORAL | 0 refills | Status: DC

## 2017-12-23 MED ORDER — CYCLOBENZAPRINE HCL 10 MG PO TABS
10.0000 mg | ORAL_TABLET | Freq: Once | ORAL | Status: AC
  Administered 2017-12-23: 10 mg via ORAL
  Filled 2017-12-23: qty 1

## 2017-12-23 NOTE — ED Notes (Signed)
See triage note.

## 2017-12-23 NOTE — ED Notes (Signed)
At the request of the pt her mother Starleen Arms was called at [phone] to come and drive pt home

## 2017-12-23 NOTE — ED Triage Notes (Signed)
Pt c/o back pain that is up and down entire spinal cord with sharp pains radiating into bilat arms and running down back of both legs
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
## 2017-12-30 ENCOUNTER — Other Ambulatory Visit: Payer: Self-pay | Admitting: Student

## 2017-12-30 DIAGNOSIS — M545 Low back pain: Secondary | ICD-10-CM

## 2017-12-30 DIAGNOSIS — M5412 Radiculopathy, cervical region: Secondary | ICD-10-CM

## 2018-01-01 ENCOUNTER — Telehealth: Payer: Self-pay | Admitting: *Deleted

## 2018-01-08 ENCOUNTER — Ambulatory Visit: Payer: Medicaid Other

## 2018-01-08 ENCOUNTER — Ambulatory Visit
Admission: RE | Admit: 2018-01-08 | Discharge: 2018-01-08 | Disposition: A | Discharge status: Home / Self Care | Payer: Medicaid Other | Source: Ambulatory Visit | Attending: Student | Admitting: Student

## 2018-01-20 ENCOUNTER — Ambulatory Visit: Payer: Medicaid Other

## 2018-01-20 ENCOUNTER — Ambulatory Visit
Admission: RE | Admit: 2018-01-20 | Discharge: 2018-01-20 | Disposition: A | Discharge status: Home / Self Care | Payer: Medicaid Other | Source: Ambulatory Visit | Attending: Student | Admitting: Student

## 2018-01-20 DIAGNOSIS — M5412 Radiculopathy, cervical region: Secondary | ICD-10-CM | POA: Diagnosis present

## 2018-01-20 DIAGNOSIS — M50122 Cervical disc disorder at C5-C6 level with radiculopathy: Secondary | ICD-10-CM | POA: Insufficient documentation

## 2018-05-18 ENCOUNTER — Ambulatory Visit: Payer: Self-pay | Admitting: Student in an Organized Health Care Education/Training Program

## 2018-05-31 ENCOUNTER — Other Ambulatory Visit: Payer: Self-pay | Admitting: Internal Medicine

## 2018-05-31 DIAGNOSIS — R102 Pelvic and perineal pain: Secondary | ICD-10-CM

## 2018-06-03 ENCOUNTER — Ambulatory Visit
Admission: RE | Admit: 2018-06-03 | Discharge: 2018-06-03 | Disposition: A | Discharge status: Home / Self Care | Payer: Medicaid Other | Source: Ambulatory Visit | Attending: Internal Medicine | Admitting: Internal Medicine

## 2018-06-03 ENCOUNTER — Encounter (INDEPENDENT_AMBULATORY_CARE_PROVIDER_SITE_OTHER): Payer: Self-pay

## 2018-06-03 DIAGNOSIS — N83202 Unspecified ovarian cyst, left side: Secondary | ICD-10-CM | POA: Insufficient documentation

## 2018-06-03 DIAGNOSIS — N83201 Unspecified ovarian cyst, right side: Secondary | ICD-10-CM | POA: Insufficient documentation

## 2018-06-03 DIAGNOSIS — R102 Pelvic and perineal pain: Secondary | ICD-10-CM | POA: Diagnosis present

## 2018-06-16 ENCOUNTER — Telehealth: Payer: Self-pay | Admitting: Obstetrics & Gynecology

## 2018-06-16 NOTE — Telephone Encounter (Signed)
[facility] referring for heavy menses, anemia. Attempt to reach patient. Phone line didn't connect unable to leave message to patient to call back to be schedule

## 2018-06-17 NOTE — Telephone Encounter (Signed)
Patient is schedule 06/22/18 with [facility]

## 2018-06-20 DIAGNOSIS — D509 Iron deficiency anemia, unspecified: Secondary | ICD-10-CM | POA: Insufficient documentation
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
## 2018-06-21 ENCOUNTER — Inpatient Hospital Stay: Payer: Medicaid Other | Attending: Oncology | Admitting: Oncology

## 2018-06-21 ENCOUNTER — Other Ambulatory Visit: Payer: Self-pay

## 2018-06-21 ENCOUNTER — Encounter: Payer: Self-pay | Admitting: Oncology

## 2018-06-21 DIAGNOSIS — Z85850 Personal history of malignant neoplasm of thyroid: Secondary | ICD-10-CM | POA: Diagnosis not present

## 2018-06-21 DIAGNOSIS — D509 Iron deficiency anemia, unspecified: Secondary | ICD-10-CM | POA: Diagnosis present

## 2018-06-21 NOTE — Progress Notes (Signed)
New patient visit for iron deficiency anemia.  History of thyroid cancer, former patient of Dr Pandit's.

## 2018-06-22 ENCOUNTER — Ambulatory Visit: Payer: Medicaid Other | Admitting: Obstetrics and Gynecology

## 2018-06-22 ENCOUNTER — Encounter: Payer: Self-pay | Admitting: Obstetrics and Gynecology

## 2018-06-22 VITALS — BP 124/84 | HR 101 | Ht 67.0 in | Wt 197.5 lb

## 2018-06-22 DIAGNOSIS — R87610 Atypical squamous cells of undetermined significance on cytologic smear of cervix (ASC-US): Secondary | ICD-10-CM

## 2018-06-22 DIAGNOSIS — D500 Iron deficiency anemia secondary to blood loss (chronic): Secondary | ICD-10-CM

## 2018-06-22 DIAGNOSIS — Z30014 Encounter for initial prescription of intrauterine contraceptive device: Secondary | ICD-10-CM

## 2018-06-22 DIAGNOSIS — N938 Other specified abnormal uterine and vaginal bleeding: Secondary | ICD-10-CM | POA: Diagnosis not present

## 2018-06-22 DIAGNOSIS — R102 Pelvic and perineal pain: Secondary | ICD-10-CM | POA: Diagnosis not present

## 2018-06-22 DIAGNOSIS — N921 Excessive and frequent menstruation with irregular cycle: Secondary | ICD-10-CM | POA: Diagnosis not present

## 2018-06-22 NOTE — Patient Instructions (Signed)
I value your feedback and entrusting us with your care. If you get a [facility] patient survey, I would appreciate you taking the time to let us know about your experience today. Thank you! 
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
## 2018-06-23 ENCOUNTER — Inpatient Hospital Stay: Payer: Medicaid Other

## 2018-06-23 VITALS — BP 128/77 | HR 86 | Resp 18

## 2018-06-23 DIAGNOSIS — D509 Iron deficiency anemia, unspecified: Secondary | ICD-10-CM

## 2018-06-23 MED ORDER — FERUMOXYTOL INJECTION 510 MG/17 ML
510.0000 mg | Freq: Once | INTRAVENOUS | Status: AC
  Administered 2018-06-23: 510 mg via INTRAVENOUS
  Filled 2018-06-23: qty 17

## 2018-06-23 MED ORDER — SODIUM CHLORIDE 0.9 % IV SOLN
Freq: Once | INTRAVENOUS | Status: AC
  Administered 2018-06-23: 14:00:00 via INTRAVENOUS
  Filled 2018-06-23: qty 1000

## 2018-06-30 ENCOUNTER — Inpatient Hospital Stay: Payer: Medicaid Other

## 2018-06-30 ENCOUNTER — Other Ambulatory Visit: Payer: Self-pay | Admitting: Family

## 2018-06-30 VITALS — BP 134/77 | HR 93 | Temp 98.9°F | Resp 20

## 2018-06-30 DIAGNOSIS — D509 Iron deficiency anemia, unspecified: Secondary | ICD-10-CM | POA: Diagnosis not present

## 2018-06-30 DIAGNOSIS — M5134 Other intervertebral disc degeneration, thoracic region: Secondary | ICD-10-CM

## 2018-06-30 DIAGNOSIS — M5030 Other cervical disc degeneration, unspecified cervical region: Secondary | ICD-10-CM

## 2018-06-30 DIAGNOSIS — M5136 Other intervertebral disc degeneration, lumbar region: Secondary | ICD-10-CM

## 2018-06-30 MED ORDER — SODIUM CHLORIDE 0.9 % IV SOLN
Freq: Once | INTRAVENOUS | Status: AC
  Administered 2018-06-30: 14:00:00 via INTRAVENOUS
  Filled 2018-06-30: qty 1000

## 2018-06-30 MED ORDER — SODIUM CHLORIDE 0.9 % IV SOLN
510.0000 mg | Freq: Once | INTRAVENOUS | Status: AC
  Administered 2018-06-30: 510 mg via INTRAVENOUS
  Filled 2018-06-30: qty 17

## 2018-07-05 ENCOUNTER — Telehealth: Payer: Self-pay | Admitting: Obstetrics and Gynecology

## 2018-07-05 ENCOUNTER — Other Ambulatory Visit: Payer: Self-pay | Admitting: Obstetrics and Gynecology

## 2018-07-05 MED ORDER — MISOPROSTOL 100 MCG PO TABS: 100.0000 ug | ORAL_TABLET | Freq: Once | ORAL | 0 refills | Status: DC

## 2018-07-05 NOTE — Progress Notes (Signed)
Rx before IUD insertion.

## 2018-07-05 NOTE — Telephone Encounter (Signed)
Patient is schedule for Mirena insertion 08/06/18. Patient needs the prescription for Cytotec?

## 2018-07-05 NOTE — Telephone Encounter (Signed)
Rx eRxd. RN to notify pt to take 1 hr before appt.

## 2018-07-05 NOTE — Telephone Encounter (Signed)
Called pt and the phone said unable to take calls right now.

## 2018-07-06 ENCOUNTER — Encounter: Payer: Self-pay | Admitting: Obstetrics and Gynecology

## 2018-07-06 ENCOUNTER — Ambulatory Visit: Payer: Medicaid Other | Admitting: Obstetrics and Gynecology

## 2018-07-06 ENCOUNTER — Other Ambulatory Visit: Payer: Self-pay | Admitting: Obstetrics and Gynecology

## 2018-07-06 VITALS — BP 122/80 | HR 94 | Ht 67.0 in | Wt 196.5 lb

## 2018-07-06 DIAGNOSIS — N921 Excessive and frequent menstruation with irregular cycle: Secondary | ICD-10-CM

## 2018-07-06 DIAGNOSIS — Z30430 Encounter for insertion of intrauterine contraceptive device: Secondary | ICD-10-CM

## 2018-07-06 DIAGNOSIS — N898 Other specified noninflammatory disorders of vagina: Secondary | ICD-10-CM

## 2018-07-06 MED ORDER — FLUCONAZOLE 150 MG PO TABS: 150.0000 mg | ORAL_TABLET | Freq: Once | ORAL | 0 refills | Status: AC

## 2018-07-06 MED ORDER — MISOPROSTOL 100 MCG PO TABS: 100.0000 ug | ORAL_TABLET | Freq: Once | ORAL | 0 refills | Status: DC

## 2018-07-06 MED ORDER — LEVONORGESTREL 20 MCG/24HR IU IUD: 1.0000 | INTRAUTERINE_SYSTEM | Freq: Once | INTRAUTERINE | 0 refills | Status: DC

## 2018-07-06 NOTE — Progress Notes (Signed)
   Chief Complaint  Patient presents with  . Contraception    Mirena Insertion     IUD PROCEDURE NOTE:  Kristina Li is a 37 y.o. G1P0010 here for Mirena  IUD insertion for menorrhagia.   Pt is also having vaginal itching for over a wk. Pt had neg wet prep with PCP last wk but still having sx.   BP 122/80   Pulse 94   Ht 5\' 7"  (1.702 m)   Wt 196 lb 8 oz (89.1 kg)   LMP 07/05/2018 (Exact Date)   BMI 30.78 kg/m   IUD Insertion Procedure Note Patient identified, informed consent performed, consent signed.   Discussed risks of irregular bleeding, cramping, infection, malpositioning or misplacement of the IUD outside the uterus which may require further procedure such as laparoscopy, risk of failure <1%. Time out was performed.    Speculum placed in the vagina.  Cervix visualized.  Cleaned with Betadine x 2.  Grasped anteriorly with a single tooth tenaculum.  Uterus sounded to 8.0 cm.   IUD placed per manufacturer's recommendations.  Strings trimmed to 3 cm. Tenaculum was removed, good hemostasis noted.  Patient tolerated procedure well.   ASSESSMENT:  Encounter for insertion of intrauterine contraceptive device (IUD) - Plan: levonorgestrel (MIRENA, 52 MG,) 20 MCG/24HR IUD  Menometrorrhagia - Try IUD. RTO in 4 wks for f/u.   Vaginal itching - Rx diflucan. F/u prn.  - Plan: fluconazole (DIFLUCAN) 150 MG tablet   Meds ordered this encounter  Medications  . levonorgestrel (MIRENA, 52 MG,) 20 MCG/24HR IUD    Sig: 1 Intra Uterine Device (1 each total) by Intrauterine route once for 1 dose.    Dispense:  1 Intra Uterine Device    Refill:  0    Order Specific Question:   Supervising Provider    Answer:   [name] [id number]  . fluconazole (DIFLUCAN) 150 MG tablet    Sig: Take 1 tablet (150 mg total) by mouth once for 1 dose.    Dispense:  1 tablet    Refill:  0    Order Specific Question:   Supervising Provider    Answer:   [name] [[id number]]      Plan:  Patient was given post-procedure instructions.  She was advised to have backup contraception for one week.   Call if you are having increasing pain, cramps or bleeding or if you have a fever greater than 100.4 degrees F., shaking chills, nausea or vomiting. Patient was also asked to check IUD strings periodically and follow up in 4 weeks for IUD check.  Return in about 1 month (around 08/03/2018) for IUD check.  Devinne Epstein B. Gerald Kuehl, PA-C 07/06/2018 4:00 PM

## 2018-07-06 NOTE — Telephone Encounter (Signed)
Patient is calling to report her prescription is not at [facility]. Please advise patient is planning on being at that Pharmacy at 1:30 so she will have time to take medication before her schedule appointment today 07/06/18 with [facility] at 2:50

## 2018-07-06 NOTE — Patient Instructions (Signed)
I value your feedback and entrusting us with your care. If you get a [facility] patient survey, I would appreciate you taking the time to let us know about your experience today. Thank you!  [facility] [phone]  Instructions after IUD insertion  Most women experience no significant problems after insertion of an IUD, however minor cramping and spotting for a few days is common. Cramps may be treated with ibuprofen 800mg every 8 hours or Tylenol 650 mg every 4 hours. Contact [facility] immediately if you experience any of the following symptoms during the next week: temperature >99.6 degrees, worsening pelvic pain, abdominal pain, fainting, unusually heavy vaginal bleeding, foul vaginal discharge, or if you think you have expelled the IUD.  Nothing inserted in the vagina for 48 hours. You will be scheduled for a follow up visit in approximately four weeks.  You should check monthly to be sure you can feel the IUD strings in the upper vagina. If you are having a monthly period, try to check after each period. If you cannot feel the IUD strings,  contact [facility] immediately so we can do an exam to determine if the IUD has been expelled.   Please use backup protection until we can confirm the IUD is in place.  Call [facility] if you are exposed to or diagnosed with a sexually transmitted infection, as we will need to discuss whether it is safe for you to continue using an IUD.   

## 2018-07-10 ENCOUNTER — Ambulatory Visit
Admission: RE | Admit: 2018-07-10 | Discharge: 2018-07-10 | Disposition: A | Discharge status: Home / Self Care | Payer: Medicaid Other | Source: Ambulatory Visit | Attending: Family | Admitting: Family

## 2018-07-10 DIAGNOSIS — M5030 Other cervical disc degeneration, unspecified cervical region: Secondary | ICD-10-CM

## 2018-07-10 DIAGNOSIS — M5136 Other intervertebral disc degeneration, lumbar region: Secondary | ICD-10-CM

## 2018-07-10 DIAGNOSIS — M5134 Other intervertebral disc degeneration, thoracic region: Secondary | ICD-10-CM

## 2018-07-14 ENCOUNTER — Other Ambulatory Visit: Payer: Self-pay

## 2018-07-14 ENCOUNTER — Emergency Department (HOSPITAL_COMMUNITY)
Admission: EM | Admit: 2018-07-14 | Discharge: 2018-07-14 | Disposition: A | Discharge status: Home / Self Care | Payer: Medicaid Other | Attending: Emergency Medicine | Admitting: Emergency Medicine

## 2018-07-14 ENCOUNTER — Encounter (HOSPITAL_COMMUNITY): Payer: Self-pay

## 2018-07-14 ENCOUNTER — Emergency Department (HOSPITAL_COMMUNITY): Payer: Medicaid Other

## 2018-07-14 DIAGNOSIS — J45909 Unspecified asthma, uncomplicated: Secondary | ICD-10-CM | POA: Diagnosis not present

## 2018-07-14 DIAGNOSIS — E039 Hypothyroidism, unspecified: Secondary | ICD-10-CM | POA: Insufficient documentation

## 2018-07-14 DIAGNOSIS — L03115 Cellulitis of right lower limb: Secondary | ICD-10-CM | POA: Insufficient documentation

## 2018-07-14 DIAGNOSIS — I10 Essential (primary) hypertension: Secondary | ICD-10-CM | POA: Diagnosis not present

## 2018-07-14 DIAGNOSIS — M25561 Pain in right knee: Secondary | ICD-10-CM | POA: Diagnosis present

## 2018-07-14 DIAGNOSIS — Z79899 Other long term (current) drug therapy: Secondary | ICD-10-CM | POA: Diagnosis not present

## 2018-07-14 HISTORY — DX: Nonrheumatic mitral (valve) insufficiency: I34.0

## 2018-07-14 HISTORY — DX: Palpitations: R00.2

## 2018-07-14 HISTORY — DX: Nonrheumatic mitral valve disorder, unspecified: I34.9

## 2018-07-14 LAB — CBC WITH DIFFERENTIAL/PLATELET
Basophils Absolute: 0 10*3/uL (ref 0.0–0.1)
Basophils Relative: 0 %
EOS ABS: 0 10*3/uL (ref 0.0–0.7)
Eosinophils Relative: 0 %
HCT: 36.3 % (ref 36.0–46.0)
Hemoglobin: 11.4 g/dL — ABNORMAL LOW (ref 12.0–15.0)
LYMPHS ABS: 1.5 10*3/uL (ref 0.7–4.0)
Lymphocytes Relative: 17 %
MCH: 24.6 pg — AB (ref 26.0–34.0)
MCHC: 31.4 g/dL (ref 30.0–36.0)
MCV: 78.4 fL (ref 78.0–100.0)
MONO ABS: 0.8 10*3/uL (ref 0.1–1.0)
MONOS PCT: 9 %
NEUTROS PCT: 74 %
Neutro Abs: 6.4 10*3/uL (ref 1.7–7.7)
PLATELETS: 283 10*3/uL (ref 150–400)
RBC: 4.63 MIL/uL (ref 3.87–5.11)
RDW: 23.1 % — ABNORMAL HIGH (ref 11.5–15.5)
WBC: 8.7 10*3/uL (ref 4.0–10.5)

## 2018-07-14 LAB — BASIC METABOLIC PANEL
Anion gap: 7 (ref 5–15)
BUN: 11 mg/dL (ref 6–20)
CALCIUM: 8.9 mg/dL (ref 8.9–10.3)
CO2: 26 mmol/L (ref 22–32)
CREATININE: 1.03 mg/dL — AB (ref 0.44–1.00)
Chloride: 104 mmol/L (ref 98–111)
GFR calc non Af Amer: >60 mL/min (ref >60)
Glucose, Bld: 106 mg/dL — ABNORMAL HIGH (ref 70–99)
Potassium: 3.9 mmol/L (ref 3.5–5.1)
Sodium: 137 mmol/L (ref 135–145)

## 2018-07-14 MED ORDER — DOXYCYCLINE HYCLATE 100 MG PO CAPS: 100.0000 mg | ORAL_CAPSULE | Freq: Two times a day (BID) | ORAL | 0 refills | Status: DC

## 2018-07-14 MED ORDER — HYDROCODONE-ACETAMINOPHEN 5-325 MG PO TABS
1.0000 | ORAL_TABLET | Freq: Once | ORAL | Status: AC
Start: 2018-07-14 — End: 2018-07-14
  Administered 2018-07-14: 1 via ORAL
  Filled 2018-07-14: qty 1

## 2018-07-14 MED ORDER — IBUPROFEN 800 MG PO TABS: 800.0000 mg | ORAL_TABLET | Freq: Four times a day (QID) | ORAL | 0 refills | Status: DC | PRN

## 2018-07-14 MED ORDER — ONDANSETRON 8 MG PO TBDP
8.0000 mg | ORAL_TABLET | Freq: Once | ORAL | Status: AC
  Administered 2018-07-14: 8 mg via ORAL
  Filled 2018-07-14: qty 1

## 2018-07-14 NOTE — Telephone Encounter (Signed)
Pt seen mirena rcvd/charged 07/06/18

## 2018-07-14 NOTE — ED Notes (Signed)
Lab at the bedside 
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
## 2018-07-14 NOTE — Discharge Instructions (Addendum)
Use the crutches for weight bearing.  Take the antibiotic as directed until its finished.  Follow-up with the orthopedic provider listed or return here in 2-3 days if the symptoms are not improving

## 2018-07-14 NOTE — ED Notes (Signed)
Pt eating crackers and drinking ginger ale. Family at the bedside.

## 2018-07-14 NOTE — ED Notes (Signed)
Pt going to xray  

## 2018-07-14 NOTE — ED Triage Notes (Signed)
Pt c/o pain in r knee x 2 days.  Area swollen.  Small area of redness noted.  PT thinks the redness came from her resting her arm on her knee.  Denies injury.

## 2018-07-20 ENCOUNTER — Ambulatory Visit
Admission: RE | Admit: 2018-07-20 | Discharge: 2018-07-20 | Disposition: A | Discharge status: Home / Self Care | Payer: Medicaid Other | Source: Ambulatory Visit | Attending: Family | Admitting: Family

## 2018-07-20 DIAGNOSIS — M8938 Hypertrophy of bone, other site: Secondary | ICD-10-CM | POA: Diagnosis not present

## 2018-07-20 DIAGNOSIS — M4186 Other forms of scoliosis, lumbar region: Secondary | ICD-10-CM | POA: Diagnosis not present

## 2018-07-20 DIAGNOSIS — M5136 Other intervertebral disc degeneration, lumbar region: Secondary | ICD-10-CM | POA: Insufficient documentation

## 2018-07-20 DIAGNOSIS — M5030 Other cervical disc degeneration, unspecified cervical region: Secondary | ICD-10-CM | POA: Insufficient documentation

## 2018-07-20 DIAGNOSIS — M5134 Other intervertebral disc degeneration, thoracic region: Secondary | ICD-10-CM | POA: Insufficient documentation

## 2018-07-26 ENCOUNTER — Inpatient Hospital Stay: Payer: Medicaid Other | Attending: Oncology

## 2018-07-26 ENCOUNTER — Other Ambulatory Visit: Payer: Self-pay

## 2018-07-26 ENCOUNTER — Telehealth: Payer: Self-pay | Admitting: *Deleted

## 2018-07-26 DIAGNOSIS — D509 Iron deficiency anemia, unspecified: Secondary | ICD-10-CM

## 2018-07-26 DIAGNOSIS — Z85850 Personal history of malignant neoplasm of thyroid: Secondary | ICD-10-CM | POA: Diagnosis not present

## 2018-07-26 LAB — IRON AND TIBC
IRON: 58 ug/dL (ref 28–170)
Saturation Ratios: 21 % (ref 10.4–31.8)
TIBC: 273 ug/dL (ref 250–450)
UIBC: 215 ug/dL

## 2018-07-26 LAB — CBC WITH DIFFERENTIAL/PLATELET
BASOS PCT: 1 %
Basophils Absolute: 0 10*3/uL (ref 0–0.1)
Eosinophils Absolute: 0 10*3/uL (ref 0–0.7)
Eosinophils Relative: 1 %
HEMATOCRIT: 36.5 % (ref 35.0–47.0)
HEMOGLOBIN: 11.8 g/dL — AB (ref 12.0–16.0)
Lymphocytes Relative: 24 %
Lymphs Abs: 1.6 10*3/uL (ref 1.0–3.6)
MCH: 25.6 pg — AB (ref 26.0–34.0)
MCHC: 32.4 g/dL (ref 32.0–36.0)
MCV: 79.1 fL — AB (ref 80.0–100.0)
MONO ABS: 0.4 10*3/uL (ref 0.2–0.9)
MONOS PCT: 6 %
NEUTROS ABS: 4.8 10*3/uL (ref 1.4–6.5)
NEUTROS PCT: 68 %
Platelets: 321 10*3/uL (ref 150–440)
RBC: 4.61 MIL/uL (ref 3.80–5.20)
RDW: 25.8 % — AB (ref 11.5–14.5)
WBC: 6.9 10*3/uL (ref 3.6–11.0)

## 2018-07-26 LAB — FERRITIN: Ferritin: 256 ng/mL (ref 11–307)

## 2018-07-26 NOTE — Telephone Encounter (Signed)
Patient called and states she is not feeling well, weight loss in past week, thinks her anemia is worse and would like to come in for lab check.  She was in ER 12 days ago and had CBC drawn )   Ref Range & Units 12d ago  WBC 4.0 - 10.5 K/uL 8.7   RBC 3.87 - 5.11 MIL/uL 4.63   Hemoglobin 12.0 - 15.0 g/dL 11.4Low    HCT 36.0 - 46.0 % 36.3   MCV 78.0 - 100.0 fL 78.4   MCH 26.0 - 34.0 pg 24.6Low    MCHC 30.0 - 36.0 g/dL 31.4   RDW 11.5 - 15.5 % 23.1High    Platelets 150 - 400 K/uL 283   Neutrophils Relative % % 74   Neutro Abs 1.7 - 7.7 K/uL 6.4   Lymphocytes Relative % 17   Lymphs Abs 0.7 - 4.0 K/uL 1.5   Monocytes Relative % 9   Monocytes Absolute 0.1 - 1.0 K/uL 0.8   Eosinophils Relative % 0   Eosinophils Absolute 0.0 - 0.7 K/uL 0.0   Basophils Relative % 0   Basophils Absolute 0.0 - 0.1 K/uL 0.0   RBC Morphology  ANISOCYTES   Comment: Performed at [facility], [address] [place], [place] [address]  Resulting Agency  [place]      Specimen Collected: 07/14/18 11:08 Last Resulted: 07/14/18 11:37        Please advise

## 2018-07-26 NOTE — Telephone Encounter (Signed)
Per VO Dr Grayland Ormond, check CBC, IIBC, Ferr. Patient accepts appointment for this PM at 3

## 2018-08-03 ENCOUNTER — Ambulatory Visit: Payer: Medicaid Other | Admitting: Obstetrics and Gynecology

## 2018-08-04 ENCOUNTER — Ambulatory Visit: Payer: Medicaid Other | Admitting: Obstetrics and Gynecology

## 2018-08-04 ENCOUNTER — Encounter: Payer: Self-pay | Admitting: Obstetrics and Gynecology

## 2018-08-04 VITALS — BP 120/80 | HR 85 | Ht 67.0 in | Wt 190.0 lb

## 2018-08-04 DIAGNOSIS — Z30431 Encounter for routine checking of intrauterine contraceptive device: Secondary | ICD-10-CM

## 2018-08-04 NOTE — Patient Instructions (Signed)
I value your feedback and entrusting us with your care. If you get a [place] patient survey, I would appreciate you taking the time to let us know about your experience today. Thank you! 

## 2018-08-04 NOTE — Progress Notes (Signed)
   Chief Complaint  Patient presents with  . Follow-up    IUD string check, pt has qs abt the bleeding (colorwise), flow is lighter     History of Present Illness:  Kristina Li is a 37 y.o. that had a Mirena IUD placed approximately 4 weeks ago. Since that time, she denies dyspareunia, pelvic pain,  vaginal d/c, heavy bleeding. Has had light bleeding/spotting and mild cramping. Had IUD placed for menorrhagia and anemia. Doing well so far.   Review of Systems  Constitutional: Positive for fatigue. Negative for fever.  Gastrointestinal: Negative for blood in stool, constipation, diarrhea, nausea and vomiting.  Genitourinary: Positive for vaginal bleeding. Negative for dyspareunia, dysuria, flank pain, frequency, hematuria, urgency, vaginal discharge and vaginal pain.  Musculoskeletal: Negative for back pain.  Skin: Negative for rash.    Physical Exam:  BP 120/80   Pulse 85   Ht 5\' 7"  (1.702 m)   Wt 190 lb (86.2 kg)   LMP 07/28/2018 (Approximate)   BMI 29.76 kg/m  Body mass index is 29.76 kg/m.  Pelvic exam:  Two IUD strings present seen coming from the cervical os. EGBUS, vaginal vault and cervix: within normal limits   Assessment:   Encounter for routine checking of intrauterine contraceptive device (IUD)  IUD strings present in proper location; pt doing well  Plan: F/u if any signs of infection or can no longer feel the strings.   [name] B. Kaydynce Pat, PA-C 08/04/2018 11:58 AM

## 2018-08-22 ENCOUNTER — Emergency Department
Admission: EM | Admit: 2018-08-22 | Discharge: 2018-08-23 | Disposition: A | Discharge status: Home / Self Care | Payer: Medicaid Other | Attending: Emergency Medicine | Admitting: Emergency Medicine

## 2018-08-22 ENCOUNTER — Other Ambulatory Visit: Payer: Self-pay

## 2018-08-22 ENCOUNTER — Encounter: Payer: Self-pay | Admitting: Emergency Medicine

## 2018-08-22 DIAGNOSIS — Y93G1 Activity, food preparation and clean up: Secondary | ICD-10-CM | POA: Diagnosis not present

## 2018-08-22 DIAGNOSIS — R0989 Other specified symptoms and signs involving the circulatory and respiratory systems: Secondary | ICD-10-CM | POA: Diagnosis present

## 2018-08-22 DIAGNOSIS — Y998 Other external cause status: Secondary | ICD-10-CM | POA: Diagnosis not present

## 2018-08-22 DIAGNOSIS — Z923 Personal history of irradiation: Secondary | ICD-10-CM | POA: Diagnosis not present

## 2018-08-22 DIAGNOSIS — T18128A Food in esophagus causing other injury, initial encounter: Secondary | ICD-10-CM | POA: Diagnosis not present

## 2018-08-22 DIAGNOSIS — Z85850 Personal history of malignant neoplasm of thyroid: Secondary | ICD-10-CM | POA: Diagnosis not present

## 2018-08-22 DIAGNOSIS — K222 Esophageal obstruction: Secondary | ICD-10-CM | POA: Diagnosis not present

## 2018-08-22 DIAGNOSIS — J449 Chronic obstructive pulmonary disease, unspecified: Secondary | ICD-10-CM | POA: Diagnosis not present

## 2018-08-22 DIAGNOSIS — Y929 Unspecified place or not applicable: Secondary | ICD-10-CM | POA: Diagnosis not present

## 2018-08-22 DIAGNOSIS — X58XXXA Exposure to other specified factors, initial encounter: Secondary | ICD-10-CM | POA: Insufficient documentation

## 2018-08-22 DIAGNOSIS — Z79899 Other long term (current) drug therapy: Secondary | ICD-10-CM | POA: Diagnosis not present

## 2018-08-22 DIAGNOSIS — Z91040 Latex allergy status: Secondary | ICD-10-CM | POA: Insufficient documentation

## 2018-08-22 DIAGNOSIS — E039 Hypothyroidism, unspecified: Secondary | ICD-10-CM | POA: Diagnosis not present

## 2018-08-22 HISTORY — DX: Other cervical disc degeneration, unspecified cervical region: M50.30

## 2018-08-22 HISTORY — DX: Chronic obstructive pulmonary disease, unspecified: J44.9

## 2018-08-22 HISTORY — DX: Occipital neuralgia: M54.81

## 2018-08-22 HISTORY — DX: Chronic pain syndrome: G89.4

## 2018-08-22 MED ORDER — GI COCKTAIL ~~LOC~~
30.0000 mL | Freq: Once | ORAL | Status: AC
  Administered 2018-08-22: 30 mL via ORAL
  Filled 2018-08-22: qty 30

## 2018-08-22 MED ORDER — NITROGLYCERIN 0.4 MG SL SUBL
0.4000 mg | SUBLINGUAL_TABLET | Freq: Once | SUBLINGUAL | Status: AC
  Administered 2018-08-23: 0.4 mg via SUBLINGUAL
  Filled 2018-08-22: qty 1
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
## 2018-08-22 NOTE — ED Triage Notes (Addendum)
Patient states that she was eating chicken about an hour ago and feels like that it has gotten stuck. Patient states that she is able to swallow.

## 2018-08-23 NOTE — Discharge Instructions (Addendum)
Please make an appointment to follow-up with GI within the next week for reevaluation and return to the emergency department sooner for any concerns.  It was a pleasure to take care of you today, and thank you for coming to our emergency department.  If you have any questions or concerns before leaving please ask the nurse to grab me and I'm more than happy to go through your aftercare instructions again.  If you have any concerns once you are home that you are not improving or are in fact getting worse before you can make it to your follow-up appointment, please do not hesitate to call [phone] and come back for further evaluation.  Darel Hong, MD

## 2018-09-06 ENCOUNTER — Encounter (INDEPENDENT_AMBULATORY_CARE_PROVIDER_SITE_OTHER): Payer: Self-pay

## 2018-09-06 ENCOUNTER — Encounter: Payer: Self-pay | Admitting: Gastroenterology

## 2018-09-06 ENCOUNTER — Ambulatory Visit: Payer: Medicaid Other | Admitting: Gastroenterology

## 2018-09-06 ENCOUNTER — Other Ambulatory Visit: Payer: Self-pay

## 2018-09-06 VITALS — BP 105/63 | HR 81 | Ht 67.0 in | Wt 190.0 lb

## 2018-09-06 DIAGNOSIS — R4702 Dysphasia: Secondary | ICD-10-CM

## 2018-09-06 NOTE — Progress Notes (Signed)
Primary Care Physician: [name], MD  Primary Gastroenterologist:  Dr. Lucilla Li  Chief Complaint  Patient presents with  . Follow-up  . Dysphagia    HPI: Kristina Li is a 37 y.o. female here for follow-up after being seen in the emergency department.  The patient had been seen in the past by Dr. Vicente Males.  In February 2018 the patient was seen for abdominal pain with constipation.  She underwent a colonoscopy and had a polyp removed that appeared to be an inflammatory polyp.  She was also recommended to have a high-fiber diet. On the 15th of September of this year the patient was in the ER with a report of having checked again stuck in her esophagus.  During the hospital stay the patient felt like the food had gone down and drank some soda without any further obstruction.  The patient was then recommended to follow-up with GI. The patient reports that she has a feeling of fullness in her throat all the time.   Current Outpatient Medications  Medication Sig Dispense Refill  . albuterol (PROVENTIL HFA;VENTOLIN HFA) 108 (90 Base) MCG/ACT inhaler Inhale 1-2 puffs into the lungs every 6 (six) hours as needed for wheezing or shortness of breath. 1 Inhaler 0  . amitriptyline (ELAVIL) 25 MG tablet Take 2 tablets (50 mg total) by mouth at bedtime. 30 tablet 0  . cetirizine (ZYRTEC) 10 MG tablet Take 10 mg by mouth daily.    . diclofenac sodium (VOLTAREN) 1 % GEL Apply 2 g topically 4 (four) times daily.    . ferrous sulfate 325 (65 FE) MG EC tablet Take 325 mg by mouth daily with breakfast.    . fluticasone (FLOVENT HFA) 110 MCG/ACT inhaler Inhale 2 puffs into the lungs 2 (two) times daily.    . hydrOXYzine (ATARAX/VISTARIL) 10 MG tablet Take 50 mg by mouth at bedtime as needed for anxiety.    [name] levonorgestrel (MIRENA, 52 MG,) 20 MCG/24HR IUD 1 Intra Uterine Device (1 each total) by Intrauterine route once for 1 dose. 1 Intra Uterine Device 0  . levothyroxine (SYNTHROID,  LEVOTHROID) 88 MCG tablet   2  . liothyronine (CYTOMEL) 5 MCG tablet Take 5 mcg by mouth daily.    . metoprolol succinate (TOPROL-XL) 50 MG 24 hr tablet Take 1 tablet (50 mg total) by mouth daily. Reported on 04/16/2016 30 tablet 5  . pantoprazole (PROTONIX) 20 MG tablet Take 20 mg by mouth 2 (two) times daily.    . tapentadol (NUCYNTA) 50 MG tablet Take 50 mg by mouth 2 (two) times daily.    [name] tiZANidine (ZANAFLEX) 2 MG tablet Take 2 mg by mouth every 8 (eight) hours as needed for muscle spasms.     No current facility-administered medications for this visit.     Allergies as of 09/06/2018 - Review Complete 09/06/2018  Allergen Reaction Noted  . Latex Hives 11/17/2011  . Shellfish allergy Anaphylaxis 11/17/2011  . Tape Rash 12/11/2011    ROS:  General: Negative for anorexia, weight loss, fever, chills, fatigue, weakness. ENT: Negative for hoarseness, difficulty swallowing , nasal congestion. CV: Negative for chest pain, angina, palpitations, dyspnea on exertion, peripheral edema.  Respiratory: Negative for dyspnea at rest, dyspnea on exertion, cough, sputum, wheezing.  GI: See history of present illness. GU:  Negative for dysuria, hematuria, urinary incontinence, urinary frequency, nocturnal urination.  Endo: Negative for unusual weight change.    Physical Examination:   BP 105/63   Pulse 81  Ht 5\' 7"  (1.702 m)   Wt 190 lb (86.2 kg)   BMI 29.76 kg/m   General: Well-nourished, well-developed in no acute distress.  Eyes: No icterus. Conjunctivae pink. Mouth: Oropharyngeal mucosa moist and pink , no lesions erythema or exudate. Lungs: Clear to auscultation bilaterally. Non-labored. Heart: Regular rate and rhythm, no murmurs rubs or gallops.  Abdomen: Bowel sounds are normal, nontender, nondistended, no hepatosplenomegaly or masses, no abdominal bruits or hernia , no rebound or guarding.   Extremities: No lower extremity edema. No clubbing or deformities. Neuro: Alert and  oriented x 3.  Grossly intact. Skin: Warm and dry, no jaundice.   Psych: Alert and cooperative, normal mood and affect.  Labs:    Imaging Studies: No results found.  Assessment and Plan:   Kristina Li is a 37 y.o. y/o female who comes in for follow-up after being in the emergency room for dysphasia after eating chicken.  The patient now states that she has a fullness feeling in her throat.  The patient will be set up for an EGD to look for any strictures or narrowing.  If the patient continues to have dysphasia she may need manometry to look for any motility disorder.  The patient has been explained the plan and agrees with it.    Kristina Lame, MD. [name]   Note: This dictation was prepared with Dragon dictation along with smaller phrase technology. Any transcriptional errors that result from this process are unintentional.

## 2018-09-07 ENCOUNTER — Other Ambulatory Visit: Payer: Self-pay

## 2018-09-07 ENCOUNTER — Encounter: Payer: Self-pay | Admitting: *Deleted

## 2018-09-07 DIAGNOSIS — R4702 Dysphasia: Secondary | ICD-10-CM

## 2018-09-08 NOTE — Discharge Instructions (Signed)
General Anesthesia, Adult, Care After °These instructions provide you with information about caring for yourself after your procedure. Your health care provider may also give you more specific instructions. Your treatment has been planned according to current medical practices, but problems sometimes occur. Call your health care provider if you have any problems or questions after your procedure. °What can I expect after the procedure? °After the procedure, it is common to have: °· Vomiting. °· A sore throat. °· Mental slowness. ° °It is common to feel: °· Nauseous. °· Cold or shivery. °· Sleepy. °· Tired. °· Sore or achy, even in parts of your body where you did not have surgery. ° °Follow these instructions at home: °For at least 24 hours after the procedure: °· Do not: °? Participate in activities where you could fall or become injured. °? Drive. °? Use heavy machinery. °? Drink alcohol. °? Take sleeping pills or medicines that cause drowsiness. °? Make important decisions or sign legal documents. °? Take care of children on your own. °· Rest. °Eating and drinking °· If you vomit, drink water, juice, or soup when you can drink without vomiting. °· Drink enough fluid to keep your urine clear or pale yellow. °· Make sure you have little or no nausea before eating solid foods. °· Follow the diet recommended by your health care provider. °General instructions °· Have a responsible adult stay with you until you are awake and alert. °· Return to your normal activities as told by your health care provider. Ask your health care provider what activities are safe for you. °· Take over-the-counter and prescription medicines only as told by your health care provider. °· If you smoke, do not smoke without supervision. °· Keep all follow-up visits as told by your health care provider. This is important. °Contact a health care provider if: °· You continue to have nausea or vomiting at home, and medicines are not helpful. °· You  cannot drink fluids or start eating again. °· You cannot urinate after 8-12 hours. °· You develop a skin rash. °· You have fever. °· You have increasing redness at the site of your procedure. °Get help right away if: °· You have difficulty breathing. °· You have chest pain. °· You have unexpected bleeding. °· You feel that you are having a life-threatening or urgent problem. °This information is not intended to replace advice given to you by your health care provider. Make sure you discuss any questions you have with your health care provider. °Document Released: 03/02/2001 Document Revised: 04/28/2016 Document Reviewed: 11/08/2015 °Elsevier Interactive Patient Education © 2018 [employer] ° °

## 2018-09-09 ENCOUNTER — Encounter
Admission: RE | Disposition: A | Discharge status: Home / Self Care | Payer: Self-pay | Source: Ambulatory Visit | Attending: Gastroenterology

## 2018-09-09 ENCOUNTER — Ambulatory Visit: Payer: Medicaid Other | Admitting: Anesthesiology

## 2018-09-09 ENCOUNTER — Ambulatory Visit
Admission: RE | Admit: 2018-09-09 | Discharge: 2018-09-09 | Disposition: A | Discharge status: Home / Self Care | Payer: Medicaid Other | Source: Ambulatory Visit | Attending: Gastroenterology | Admitting: Gastroenterology

## 2018-09-09 DIAGNOSIS — Z9049 Acquired absence of other specified parts of digestive tract: Secondary | ICD-10-CM | POA: Diagnosis not present

## 2018-09-09 DIAGNOSIS — K219 Gastro-esophageal reflux disease without esophagitis: Secondary | ICD-10-CM | POA: Insufficient documentation

## 2018-09-09 DIAGNOSIS — Z85850 Personal history of malignant neoplasm of thyroid: Secondary | ICD-10-CM | POA: Diagnosis not present

## 2018-09-09 DIAGNOSIS — M797 Fibromyalgia: Secondary | ICD-10-CM | POA: Diagnosis not present

## 2018-09-09 DIAGNOSIS — Z8614 Personal history of Methicillin resistant Staphylococcus aureus infection: Secondary | ICD-10-CM | POA: Diagnosis not present

## 2018-09-09 DIAGNOSIS — M5136 Other intervertebral disc degeneration, lumbar region: Secondary | ICD-10-CM | POA: Insufficient documentation

## 2018-09-09 DIAGNOSIS — E890 Postprocedural hypothyroidism: Secondary | ICD-10-CM | POA: Diagnosis not present

## 2018-09-09 DIAGNOSIS — R4702 Dysphasia: Secondary | ICD-10-CM | POA: Diagnosis not present

## 2018-09-09 DIAGNOSIS — D649 Anemia, unspecified: Secondary | ICD-10-CM | POA: Diagnosis not present

## 2018-09-09 DIAGNOSIS — Z818 Family history of other mental and behavioral disorders: Secondary | ICD-10-CM | POA: Insufficient documentation

## 2018-09-09 DIAGNOSIS — I341 Nonrheumatic mitral (valve) prolapse: Secondary | ICD-10-CM | POA: Insufficient documentation

## 2018-09-09 DIAGNOSIS — R1310 Dysphagia, unspecified: Secondary | ICD-10-CM | POA: Insufficient documentation

## 2018-09-09 DIAGNOSIS — F329 Major depressive disorder, single episode, unspecified: Secondary | ICD-10-CM | POA: Diagnosis not present

## 2018-09-09 DIAGNOSIS — M5481 Occipital neuralgia: Secondary | ICD-10-CM | POA: Diagnosis not present

## 2018-09-09 DIAGNOSIS — Z7951 Long term (current) use of inhaled steroids: Secondary | ICD-10-CM | POA: Insufficient documentation

## 2018-09-09 DIAGNOSIS — Z823 Family history of stroke: Secondary | ICD-10-CM | POA: Insufficient documentation

## 2018-09-09 DIAGNOSIS — I499 Cardiac arrhythmia, unspecified: Secondary | ICD-10-CM | POA: Diagnosis not present

## 2018-09-09 DIAGNOSIS — K2900 Acute gastritis without bleeding: Secondary | ICD-10-CM | POA: Diagnosis not present

## 2018-09-09 DIAGNOSIS — F4310 Post-traumatic stress disorder, unspecified: Secondary | ICD-10-CM | POA: Insufficient documentation

## 2018-09-09 DIAGNOSIS — K2950 Unspecified chronic gastritis without bleeding: Secondary | ICD-10-CM | POA: Insufficient documentation

## 2018-09-09 DIAGNOSIS — Z8261 Family history of arthritis: Secondary | ICD-10-CM | POA: Insufficient documentation

## 2018-09-09 DIAGNOSIS — R51 Headache: Secondary | ICD-10-CM | POA: Diagnosis not present

## 2018-09-09 DIAGNOSIS — M419 Scoliosis, unspecified: Secondary | ICD-10-CM | POA: Insufficient documentation

## 2018-09-09 DIAGNOSIS — R0600 Dyspnea, unspecified: Secondary | ICD-10-CM | POA: Insufficient documentation

## 2018-09-09 DIAGNOSIS — Z825 Family history of asthma and other chronic lower respiratory diseases: Secondary | ICD-10-CM | POA: Insufficient documentation

## 2018-09-09 DIAGNOSIS — J449 Chronic obstructive pulmonary disease, unspecified: Secondary | ICD-10-CM | POA: Insufficient documentation

## 2018-09-09 DIAGNOSIS — R011 Cardiac murmur, unspecified: Secondary | ICD-10-CM | POA: Diagnosis not present

## 2018-09-09 DIAGNOSIS — F419 Anxiety disorder, unspecified: Secondary | ICD-10-CM | POA: Diagnosis not present

## 2018-09-09 DIAGNOSIS — Z811 Family history of alcohol abuse and dependence: Secondary | ICD-10-CM | POA: Insufficient documentation

## 2018-09-09 DIAGNOSIS — Z79899 Other long term (current) drug therapy: Secondary | ICD-10-CM | POA: Insufficient documentation

## 2018-09-09 DIAGNOSIS — Z8249 Family history of ischemic heart disease and other diseases of the circulatory system: Secondary | ICD-10-CM | POA: Insufficient documentation

## 2018-09-09 DIAGNOSIS — I10 Essential (primary) hypertension: Secondary | ICD-10-CM | POA: Insufficient documentation

## 2018-09-09 DIAGNOSIS — G894 Chronic pain syndrome: Secondary | ICD-10-CM | POA: Insufficient documentation

## 2018-09-09 DIAGNOSIS — Z809 Family history of malignant neoplasm, unspecified: Secondary | ICD-10-CM | POA: Insufficient documentation

## 2018-09-09 HISTORY — DX: Essential (primary) hypertension: I10

## 2018-09-09 HISTORY — PX: ESOPHAGOGASTRODUODENOSCOPY (EGD) WITH PROPOFOL: SHX5813

## 2018-09-09 HISTORY — PX: ESOPHAGEAL DILATION: SHX303

## 2018-09-09 SURGERY — ESOPHAGOGASTRODUODENOSCOPY (EGD) WITH PROPOFOL
Anesthesia: General

## 2018-09-09 MED ORDER — GLYCOPYRROLATE 0.2 MG/ML IJ SOLN
INTRAMUSCULAR | Status: DC | PRN
  Administered 2018-09-09: 0.1 mg via INTRAVENOUS

## 2018-09-09 MED ORDER — DEXMEDETOMIDINE HCL 200 MCG/2ML IV SOLN
INTRAVENOUS | Status: DC | PRN
  Administered 2018-09-09: 8 ug via INTRAVENOUS

## 2018-09-09 MED ORDER — LACTATED RINGERS IV SOLN
INTRAVENOUS | Status: DC
  Administered 2018-09-09: 10:00:00 via INTRAVENOUS

## 2018-09-09 MED ORDER — LIDOCAINE HCL (CARDIAC) PF 100 MG/5ML IV SOSY
PREFILLED_SYRINGE | INTRAVENOUS | Status: DC | PRN
  Administered 2018-09-09: 40 mg via INTRAVENOUS

## 2018-09-09 MED ORDER — PROPOFOL 10 MG/ML IV BOLUS
INTRAVENOUS | Status: DC | PRN
  Administered 2018-09-09: 10 mg via INTRAVENOUS
  Administered 2018-09-09: 100 mg via INTRAVENOUS
  Administered 2018-09-09: 20 mg via INTRAVENOUS
  Administered 2018-09-09: 30 mg via INTRAVENOUS

## 2018-09-09 MED ORDER — SODIUM CHLORIDE 0.9 % IV SOLN: INTRAVENOUS | Status: DC

## 2018-09-09 SURGICAL SUPPLY — 33 items
BALLN DILATOR 10-12 8 (BALLOONS)
BALLN DILATOR 12-15 8 (BALLOONS)
BALLN DILATOR 15-18 8 (BALLOONS) ×3
BALLN DILATOR CRE 0-12 8 (BALLOONS)
BALLN DILATOR ESOPH 8 10 CRE (MISCELLANEOUS) IMPLANT
BALLOON DILATOR 12-15 8 (BALLOONS) IMPLANT
BALLOON DILATOR 15-18 8 (BALLOONS) ×1 IMPLANT
BALLOON DILATOR CRE 0-12 8 (BALLOONS) IMPLANT
BLOCK BITE 60FR ADLT L/F GRN (MISCELLANEOUS) ×3 IMPLANT
CANISTER SUCT 1200ML W/VALVE (MISCELLANEOUS) ×3 IMPLANT
CLIP HMST 235XBRD CATH ROT (MISCELLANEOUS) IMPLANT
CLIP RESOLUTION 360 11X235 (MISCELLANEOUS)
ELECT REM PT RETURN 9FT ADLT (ELECTROSURGICAL)
ELECTRODE REM PT RTRN 9FT ADLT (ELECTROSURGICAL) IMPLANT
FCP ESCP3.2XJMB 240X2.8X (MISCELLANEOUS)
FORCEPS BIOP RAD 4 LRG CAP 4 (CUTTING FORCEPS) ×2 IMPLANT
FORCEPS BIOP RJ4 240 W/NDL (MISCELLANEOUS)
FORCEPS ESCP3.2XJMB 240X2.8X (MISCELLANEOUS) IMPLANT
GOWN CVR UNV OPN BCK APRN NK (MISCELLANEOUS) ×2 IMPLANT
GOWN ISOL THUMB LOOP REG UNIV (MISCELLANEOUS) ×6
INJECTOR VARIJECT VIN23 (MISCELLANEOUS) IMPLANT
KIT DEFENDO VALVE AND CONN (KITS) IMPLANT
KIT ENDO PROCEDURE OLY (KITS) ×3 IMPLANT
MARKER SPOT ENDO TATTOO 5ML (MISCELLANEOUS) IMPLANT
RETRIEVER NET PLAT FOOD (MISCELLANEOUS) IMPLANT
SNARE SHORT THROW 13M SML OVAL (MISCELLANEOUS) ×2 IMPLANT
SNARE SHORT THROW 30M LRG OVAL (MISCELLANEOUS) IMPLANT
SPOT EX ENDOSCOPIC TATTOO (MISCELLANEOUS)
SYR INFLATION 60ML (SYRINGE) ×3 IMPLANT
TRAP ETRAP POLY (MISCELLANEOUS) IMPLANT
VARIJECT INJECTOR VIN23 (MISCELLANEOUS)
WATER STERILE IRR 250ML POUR (IV SOLUTION) ×3 IMPLANT
WIRE CRE 18-20MM 8CM F G (MISCELLANEOUS) IMPLANT

## 2018-09-09 NOTE — Anesthesia Preprocedure Evaluation (Signed)
Anesthesia Evaluation  Patient identified by MRN, date of birth, ID band Patient awake    Reviewed: Allergy & Precautions, H&P , NPO status , Patient's Chart, lab work & pertinent test results  Airway Mallampati: II  TM Distance: >3 FB Neck ROM: full    Dental no notable dental hx.    Pulmonary asthma ,    Pulmonary exam normal breath sounds clear to auscultation       Cardiovascular hypertension, Normal cardiovascular exam+ Valvular Problems/Murmurs      Neuro/Psych Anxiety Depression Schizophrenia    GI/Hepatic Neg liver ROS, Medicated,  Endo/Other  Hypothyroidism   Renal/GU negative Renal ROS     Musculoskeletal   Abdominal   Peds  Hematology negative hematology ROS (+)   Anesthesia Other Findings   Reproductive/Obstetrics negative OB ROS                             Anesthesia Physical Anesthesia Plan  ASA: II  Anesthesia Plan: General   Post-op Pain Management:    Induction:   PONV Risk Score and Plan:   Airway Management Planned:   Additional Equipment:   Intra-op Plan:   Post-operative Plan:   Informed Consent: I have reviewed the patients History and Physical, chart, labs and discussed the procedure including the risks, benefits and alternatives for the proposed anesthesia with the patient or authorized representative who has indicated his/her understanding and acceptance.     Plan Discussed with:   Anesthesia Plan Comments:         Anesthesia Quick Evaluation

## 2018-09-09 NOTE — Op Note (Signed)
[facility] Gastroenterology Patient Name: Callan Yontz Procedure Date: 09/09/2018 10:52 AM MRN: [id number] Account #: [id number] Date of Birth: June 17, 1981 Admit Type: Outpatient Age: 37 Room: [facility] OR ROOM 01 Gender: Female Note Status: Finalized Procedure:            Upper GI endoscopy Indications:          Dysphagia Providers:            [name] MD, MD Referring MD:         [name], MD (Referring MD) Medicines:            Propofol per Anesthesia Complications:        No immediate complications. Procedure:            Pre-Anesthesia Assessment:                       - Prior to the procedure, a History and Physical was                        performed, and patient medications and allergies were                        reviewed. The patient's tolerance of previous                        anesthesia was also reviewed. The risks and benefits of                        the procedure and the sedation options and risks were                        discussed with the patient. All questions were                        answered, and informed consent was obtained. Prior                        Anticoagulants: The patient has taken no previous                        anticoagulant or antiplatelet agents. ASA Grade                        Assessment: II - A patient with mild systemic disease.                        After reviewing the risks and benefits, the patient was                        deemed in satisfactory condition to undergo the                        procedure.                       After obtaining informed consent, the endoscope was                        passed under direct vision. Throughout the procedure,  the patient's blood pressure, pulse, and oxygen                        saturations were monitored continuously. The was                        introduced through the mouth, and advanced to the                        second  part of duodenum. The upper GI endoscopy was                        accomplished without difficulty. The patient tolerated                        the procedure well. Findings:      The examined esophagus was normal. Random biopsies were obtained in the       middle third of the esophagus with cold forceps for histology. A TTS       dilator was passed through the scope. Dilation with a 15-16.5-18 mm       balloon dilator was performed to 18 mm. The dilation site was examined       following endoscope reinsertion and showed complete resolution of       luminal narrowing.      Localized moderate inflammation characterized by erythema was found in       the gastric antrum. Biopsies were taken with a cold forceps for       histology.      The examined duodenum was normal. Impression:           - Normal esophagus. Dilated.                       - Gastritis. Biopsied.                       - Normal examined duodenum.                       - Random biopsies were obtained in the middle third of                        the esophagus. Recommendation:       - Discharge patient to home.                       - Resume previous diet.                       - Continue present medications.                       - Await pathology results. Procedure Code(s):    --- Professional ---                       [phone], Esophagogastroduodenoscopy, flexible, transoral;                        with transendoscopic balloon dilation of esophagus                        (less than 30 mm diameter)  25749, Esophagogastroduodenoscopy, flexible, transoral;                        with biopsy, single or multiple Diagnosis Code(s):    --- Professional ---                       R13.10, Dysphagia, unspecified                       K29.70, Gastritis, unspecified, without bleeding CPT copyright 2017 American Medical Association. All rights reserved. The codes documented in this report are preliminary and upon  coder review may  be revised to meet current compliance requirements. [name] MD, MD 09/09/2018 11:12:16 AM This report has been signed electronically. Number of Addenda: 0 Note Initiated On: 09/09/2018 10:52 AM Total Procedure Duration: 0 hours 6 minutes 26 seconds       [facility]

## 2018-09-09 NOTE — Transfer of Care (Signed)
Immediate Anesthesia Transfer of Care Note  Patient: Kristina Li  Procedure(s) Performed: ESOPHAGOGASTRODUODENOSCOPY (EGD) WITH PROPOFOL (N/A ) ESOPHAGEAL DILATION (N/A )  Patient Location: PACU  Anesthesia Type: General  Level of Consciousness: awake, alert  and patient cooperative  Airway and Oxygen Therapy: Patient Spontanous Breathing and Patient connected to supplemental oxygen  Post-op Assessment: Post-op Vital signs reviewed, Patient's Cardiovascular Status Stable, Respiratory Function Stable, Patent Airway and No signs of Nausea or vomiting  Post-op Vital Signs: Reviewed and stable  Complications: No apparent anesthesia complications
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
## 2018-09-09 NOTE — Anesthesia Procedure Notes (Signed)
Procedure Name: MAC Date/Time: 09/09/2018 10:58 AM Performed by: Janna Arch, CRNA Pre-anesthesia Checklist: Patient identified, Emergency Drugs available, Suction available, Timeout performed and Patient being monitored Patient Re-evaluated:Patient Re-evaluated prior to induction Oxygen Delivery Method: Nasal cannula Placement Confirmation: positive ETCO2

## 2018-09-09 NOTE — Anesthesia Postprocedure Evaluation (Signed)
Anesthesia Post Note  Patient: Kristina Li  Procedure(s) Performed: ESOPHAGOGASTRODUODENOSCOPY (EGD) WITH PROPOFOL (N/A ) ESOPHAGEAL DILATION (N/A )  Patient location during evaluation: PACU Anesthesia Type: General Level of consciousness: awake and alert Pain management: pain level controlled Vital Signs Assessment: post-procedure vital signs reviewed and stable Respiratory status: spontaneous breathing Cardiovascular status: blood pressure returned to baseline Anesthetic complications: no    Jaci Standard, III,  [facility]

## 2018-09-10 ENCOUNTER — Encounter: Payer: Self-pay | Admitting: Gastroenterology

## 2018-09-13 ENCOUNTER — Encounter: Payer: Self-pay | Admitting: Gastroenterology
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
## 2018-09-21 ENCOUNTER — Inpatient Hospital Stay: Payer: Medicaid Other | Attending: Oncology

## 2018-09-21 ENCOUNTER — Encounter: Payer: Self-pay | Admitting: Oncology

## 2018-09-21 ENCOUNTER — Inpatient Hospital Stay: Payer: Medicaid Other

## 2018-09-21 ENCOUNTER — Other Ambulatory Visit: Payer: Self-pay

## 2018-09-21 ENCOUNTER — Inpatient Hospital Stay (HOSPITAL_BASED_OUTPATIENT_CLINIC_OR_DEPARTMENT_OTHER): Payer: Medicaid Other | Admitting: Oncology

## 2018-09-21 VITALS — BP 110/73 | HR 84 | Temp 96.6°F | Resp 16 | Wt 190.0 lb

## 2018-09-21 DIAGNOSIS — Z85850 Personal history of malignant neoplasm of thyroid: Secondary | ICD-10-CM | POA: Diagnosis not present

## 2018-09-21 DIAGNOSIS — Z862 Personal history of diseases of the blood and blood-forming organs and certain disorders involving the immune mechanism: Secondary | ICD-10-CM

## 2018-09-21 DIAGNOSIS — Z8550 Personal history of malignant neoplasm of unspecified urinary tract organ: Secondary | ICD-10-CM

## 2018-09-21 DIAGNOSIS — D509 Iron deficiency anemia, unspecified: Secondary | ICD-10-CM

## 2018-09-21 DIAGNOSIS — Z79899 Other long term (current) drug therapy: Secondary | ICD-10-CM | POA: Insufficient documentation

## 2018-09-21 LAB — CBC WITH DIFFERENTIAL/PLATELET
Abs Immature Granulocytes: 0.02 10*3/uL (ref 0.00–0.07)
Basophils Absolute: 0 10*3/uL (ref 0.0–0.1)
Basophils Relative: 1 %
EOS PCT: 1 %
Eosinophils Absolute: 0 10*3/uL (ref 0.0–0.5)
HEMATOCRIT: 41.6 % (ref 36.0–46.0)
Hemoglobin: 13.3 g/dL (ref 12.0–15.0)
IMMATURE GRANULOCYTES: 0 %
LYMPHS ABS: 1.8 10*3/uL (ref 0.7–4.0)
Lymphocytes Relative: 29 %
MCH: 26.7 pg (ref 26.0–34.0)
MCHC: 32 g/dL (ref 30.0–36.0)
MCV: 83.4 fL (ref 80.0–100.0)
MONO ABS: 0.6 10*3/uL (ref 0.1–1.0)
MONOS PCT: 9 %
Neutro Abs: 3.7 10*3/uL (ref 1.7–7.7)
Neutrophils Relative %: 60 %
Platelets: 265 10*3/uL (ref 150–400)
RBC: 4.99 MIL/uL (ref 3.87–5.11)
RDW: 17.9 % — AB (ref 11.5–15.5)
WBC: 6.2 10*3/uL (ref 4.0–10.5)
nRBC: 0 % (ref 0.0–0.2)

## 2018-09-21 LAB — IRON AND TIBC
IRON: 57 ug/dL (ref 28–170)
SATURATION RATIOS: 17 % (ref 10.4–31.8)
TIBC: 337 ug/dL (ref 250–450)
UIBC: 280 ug/dL

## 2018-09-21 LAB — FERRITIN: FERRITIN: 59 ng/mL (ref 11–307)

## 2018-09-21 NOTE — Progress Notes (Signed)
Patient here today for follow up and possible iron infusion.  Patient c/o fatigue.  

## 2018-09-29 ENCOUNTER — Ambulatory Visit: Payer: Medicaid Other | Admitting: Obstetrics and Gynecology

## 2018-09-29 ENCOUNTER — Encounter: Payer: Self-pay | Admitting: Obstetrics and Gynecology

## 2018-09-29 ENCOUNTER — Ambulatory Visit (INDEPENDENT_AMBULATORY_CARE_PROVIDER_SITE_OTHER): Payer: Medicaid Other | Admitting: Obstetrics and Gynecology

## 2018-09-29 VITALS — BP 118/72 | HR 90 | Ht 67.0 in | Wt 190.0 lb

## 2018-09-29 DIAGNOSIS — N921 Excessive and frequent menstruation with irregular cycle: Secondary | ICD-10-CM | POA: Diagnosis not present

## 2018-09-29 DIAGNOSIS — N760 Acute vaginitis: Secondary | ICD-10-CM | POA: Diagnosis not present

## 2018-09-29 DIAGNOSIS — K644 Residual hemorrhoidal skin tags: Secondary | ICD-10-CM | POA: Diagnosis not present

## 2018-09-29 DIAGNOSIS — Z975 Presence of (intrauterine) contraceptive device: Secondary | ICD-10-CM

## 2018-09-29 DIAGNOSIS — B9689 Other specified bacterial agents as the cause of diseases classified elsewhere: Secondary | ICD-10-CM | POA: Diagnosis not present

## 2018-09-29 DIAGNOSIS — Z30431 Encounter for routine checking of intrauterine contraceptive device: Secondary | ICD-10-CM | POA: Diagnosis not present

## 2018-09-29 DIAGNOSIS — R3915 Urgency of urination: Secondary | ICD-10-CM | POA: Diagnosis not present

## 2018-09-29 LAB — POCT WET PREP WITH KOH
CLUE CELLS WET PREP PER HPF POC: POSITIVE
KOH Prep POC: NEGATIVE
Trichomonas, UA: NEGATIVE
Yeast Wet Prep HPF POC: NEGATIVE

## 2018-09-29 LAB — POCT URINALYSIS DIPSTICK
BILIRUBIN UA: NEGATIVE
GLUCOSE UA: NEGATIVE
KETONES UA: NEGATIVE
Leukocytes, UA: NEGATIVE
Nitrite, UA: NEGATIVE
PH UA: 6 (ref 5.0–8.0)
Protein, UA: NEGATIVE
Spec Grav, UA: 1.015 (ref 1.010–1.025)

## 2018-09-29 MED ORDER — HYDROCORTISONE ACE-PRAMOXINE 2.5-1 % RE CREA: 1.0000 "application " | TOPICAL_CREAM | Freq: Three times a day (TID) | RECTAL | 0 refills | Status: DC

## 2018-09-29 MED ORDER — METRONIDAZOLE 0.75 % VA GEL: 1.0000 | Freq: Every day | VAGINAL | 0 refills | Status: DC

## 2018-09-29 NOTE — Patient Instructions (Signed)
I value your feedback and entrusting us with your care. If you get a [facility] patient survey, I would appreciate you taking the time to let us know about your experience today. Thank you! 
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
## 2018-09-30 ENCOUNTER — Encounter: Payer: Self-pay | Admitting: Emergency Medicine

## 2018-09-30 ENCOUNTER — Other Ambulatory Visit: Payer: Self-pay

## 2018-09-30 ENCOUNTER — Emergency Department
Admission: EM | Admit: 2018-09-30 | Discharge: 2018-09-30 | Disposition: A | Discharge status: Home / Self Care | Payer: Medicaid Other | Attending: Emergency Medicine | Admitting: Emergency Medicine

## 2018-09-30 DIAGNOSIS — J45909 Unspecified asthma, uncomplicated: Secondary | ICD-10-CM | POA: Insufficient documentation

## 2018-09-30 DIAGNOSIS — E039 Hypothyroidism, unspecified: Secondary | ICD-10-CM | POA: Diagnosis not present

## 2018-09-30 DIAGNOSIS — Z79899 Other long term (current) drug therapy: Secondary | ICD-10-CM | POA: Insufficient documentation

## 2018-09-30 DIAGNOSIS — Z91040 Latex allergy status: Secondary | ICD-10-CM | POA: Insufficient documentation

## 2018-09-30 DIAGNOSIS — G4440 Drug-induced headache, not elsewhere classified, not intractable: Secondary | ICD-10-CM

## 2018-09-30 DIAGNOSIS — R51 Headache: Secondary | ICD-10-CM | POA: Diagnosis present

## 2018-09-30 DIAGNOSIS — Z85850 Personal history of malignant neoplasm of thyroid: Secondary | ICD-10-CM | POA: Diagnosis not present

## 2018-09-30 MED ORDER — METOCLOPRAMIDE HCL 5 MG/ML IJ SOLN
10.0000 mg | Freq: Once | INTRAMUSCULAR | Status: AC
  Administered 2018-09-30: 10 mg via INTRAVENOUS
  Filled 2018-09-30: qty 2

## 2018-09-30 MED ORDER — BUTALBITAL-APAP-CAFFEINE 50-325-40 MG PO TABS
2.0000 | ORAL_TABLET | Freq: Once | ORAL | Status: AC
  Administered 2018-09-30: 2 via ORAL
  Filled 2018-09-30: qty 2

## 2018-09-30 MED ORDER — KETOROLAC TROMETHAMINE 30 MG/ML IJ SOLN
30.0000 mg | Freq: Once | INTRAMUSCULAR | Status: AC
  Administered 2018-09-30: 30 mg via INTRAVENOUS
  Filled 2018-09-30: qty 1

## 2018-09-30 MED ORDER — DIPHENHYDRAMINE HCL 50 MG/ML IJ SOLN
25.0000 mg | Freq: Once | INTRAMUSCULAR | Status: AC
  Administered 2018-09-30: 25 mg via INTRAVENOUS
  Filled 2018-09-30: qty 1

## 2018-09-30 NOTE — ED Notes (Signed)
Pt states "this is an emergency emergency, they need to get me on back and figure out what's going on with my head". Pt states see Dr. Primus Bravo for pain management. Pt denies trying OTC medications for HA relief.

## 2018-09-30 NOTE — Discharge Instructions (Addendum)
You have been treated for a headache. Your exam is otherwise normal. Your headache may be a side effect of the Metro-Gel your were previously prescribed. Contact your GYN provider for an alternative medicine for this infection.

## 2018-09-30 NOTE — ED Triage Notes (Signed)
Pt presents to ED via POV with c/o HA to the front and bilat temples, per EMS HA started this morning, pt has hx of 1 migraine 2 years ago. EMS reports, per patient, pt placed Flagyl gel last night per GYN doctor for BV. Per EMS initially no photosensitivity, however patient now having photosensitivity. EMS placed 20g to R ac, 4mg  Zofran given prior to arrival.   BP 114/72, HR 90, 100% on RA, CBG 93
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
## 2018-09-30 NOTE — ED Notes (Signed)
Pt to the er for severe headache. Pt has not been formally dx with migraines but does have fibromyalgia and is TX by the pain clinic. Pt took zanaflex at home with no relief.

## 2018-10-01 LAB — URINE CULTURE: ORGANISM ID, BACTERIA: NO GROWTH

## 2018-10-02 NOTE — Progress Notes (Signed)
Pls let pt know urine C&S neg. Are sx better with BV tx?

## 2018-10-19 ENCOUNTER — Other Ambulatory Visit: Payer: Self-pay | Admitting: Nurse Practitioner

## 2018-10-19 DIAGNOSIS — R609 Edema, unspecified: Secondary | ICD-10-CM

## 2018-10-19 DIAGNOSIS — M25561 Pain in right knee: Secondary | ICD-10-CM

## 2018-10-19 DIAGNOSIS — M238X1 Other internal derangements of right knee: Secondary | ICD-10-CM

## 2018-11-01 ENCOUNTER — Other Ambulatory Visit: Payer: Self-pay

## 2018-11-01 ENCOUNTER — Encounter: Payer: Self-pay | Admitting: *Deleted

## 2018-11-01 DIAGNOSIS — R4702 Dysphasia: Secondary | ICD-10-CM

## 2018-11-02 ENCOUNTER — Ambulatory Visit: Payer: Medicaid Other

## 2018-11-03 ENCOUNTER — Ambulatory Visit: Payer: Medicaid Other | Admitting: Obstetrics and Gynecology

## 2018-11-03 NOTE — Discharge Instructions (Signed)
General Anesthesia, Adult, Care After °These instructions provide you with information about caring for yourself after your procedure. Your health care provider may also give you more specific instructions. Your treatment has been planned according to current medical practices, but problems sometimes occur. Call your health care provider if you have any problems or questions after your procedure. °What can I expect after the procedure? °After the procedure, it is common to have: °· Vomiting. °· A sore throat. °· Mental slowness. ° °It is common to feel: °· Nauseous. °· Cold or shivery. °· Sleepy. °· Tired. °· Sore or achy, even in parts of your body where you did not have surgery. ° °Follow these instructions at home: °For at least 24 hours after the procedure: °· Do not: °? Participate in activities where you could fall or become injured. °? Drive. °? Use heavy machinery. °? Drink alcohol. °? Take sleeping pills or medicines that cause drowsiness. °? Make important decisions or sign legal documents. °? Take care of children on your own. °· Rest. °Eating and drinking °· If you vomit, drink water, juice, or soup when you can drink without vomiting. °· Drink enough fluid to keep your urine clear or pale yellow. °· Make sure you have little or no nausea before eating solid foods. °· Follow the diet recommended by your health care provider. °General instructions °· Have a responsible adult stay with you until you are awake and alert. °· Return to your normal activities as told by your health care provider. Ask your health care provider what activities are safe for you. °· Take over-the-counter and prescription medicines only as told by your health care provider. °· If you smoke, do not smoke without supervision. °· Keep all follow-up visits as told by your health care provider. This is important. °Contact a health care provider if: °· You continue to have nausea or vomiting at home, and medicines are not helpful. °· You  cannot drink fluids or start eating again. °· You cannot urinate after 8-12 hours. °· You develop a skin rash. °· You have fever. °· You have increasing redness at the site of your procedure. °Get help right away if: °· You have difficulty breathing. °· You have chest pain. °· You have unexpected bleeding. °· You feel that you are having a life-threatening or urgent problem. °This information is not intended to replace advice given to you by your health care provider. Make sure you discuss any questions you have with your health care provider. °Document Released: 03/02/2001 Document Revised: 04/28/2016 Document Reviewed: 11/08/2015 °Elsevier Interactive Patient Education © 2018 [employer] ° °

## 2018-11-05 NOTE — Anesthesia Preprocedure Evaluation (Addendum)
Anesthesia Evaluation  Patient identified by MRN, date of birth, ID band Patient awake    Reviewed: Allergy & Precautions, NPO status , Patient's Chart, lab work & pertinent test results  History of Anesthesia Complications Negative for: history of anesthetic complications  Airway Mallampati: I   Neck ROM: Full    Dental  (+)    Pulmonary asthma , COPD,    Pulmonary exam normal breath sounds clear to auscultation       Cardiovascular hypertension, + Valvular Problems/Murmurs MVP  Rhythm:Regular Rate:Normal + Systolic murmurs    Neuro/Psych  Headaches, PSYCHIATRIC DISORDERS (PTSD) Anxiety Depression Schizophrenia Chronic pain    GI/Hepatic GERD  ,  Endo/Other  Hypothyroidism   Renal/GU      Musculoskeletal  (+) Arthritis , Fibromyalgia -  Abdominal   Peds  Hematology  (+) Blood dyscrasia, anemia ,   Anesthesia Other Findings   Reproductive/Obstetrics                            Anesthesia Physical Anesthesia Plan  ASA: III  Anesthesia Plan: General   Post-op Pain Management:    Induction: Intravenous  PONV Risk Score and Plan: 2 and Propofol infusion and TIVA  Airway Management Planned: Natural Airway  Additional Equipment:   Intra-op Plan:   Post-operative Plan: Extubation in OR  Informed Consent: I have reviewed the patients History and Physical, chart, labs and discussed the procedure including the risks, benefits and alternatives for the proposed anesthesia with the patient or authorized representative who has indicated his/her understanding and acceptance.     Plan Discussed with: CRNA  Anesthesia Plan Comments:        Anesthesia Quick Evaluation

## 2018-11-08 ENCOUNTER — Ambulatory Visit
Admission: RE | Admit: 2018-11-08 | Discharge: 2018-11-08 | Disposition: A | Discharge status: Home / Self Care | Payer: Medicaid Other | Source: Ambulatory Visit | Attending: Gastroenterology | Admitting: Gastroenterology

## 2018-11-08 ENCOUNTER — Encounter
Admission: RE | Disposition: A | Discharge status: Home / Self Care | Payer: Self-pay | Source: Ambulatory Visit | Attending: Gastroenterology

## 2018-11-08 ENCOUNTER — Ambulatory Visit: Payer: Medicaid Other | Admitting: Anesthesiology

## 2018-11-08 ENCOUNTER — Other Ambulatory Visit: Payer: Self-pay | Admitting: Nurse Practitioner

## 2018-11-08 DIAGNOSIS — E039 Hypothyroidism, unspecified: Secondary | ICD-10-CM | POA: Diagnosis not present

## 2018-11-08 DIAGNOSIS — G894 Chronic pain syndrome: Secondary | ICD-10-CM | POA: Insufficient documentation

## 2018-11-08 DIAGNOSIS — Z923 Personal history of irradiation: Secondary | ICD-10-CM | POA: Diagnosis not present

## 2018-11-08 DIAGNOSIS — R609 Edema, unspecified: Secondary | ICD-10-CM

## 2018-11-08 DIAGNOSIS — I10 Essential (primary) hypertension: Secondary | ICD-10-CM | POA: Insufficient documentation

## 2018-11-08 DIAGNOSIS — R1310 Dysphagia, unspecified: Secondary | ICD-10-CM | POA: Diagnosis present

## 2018-11-08 DIAGNOSIS — M25561 Pain in right knee: Secondary | ICD-10-CM

## 2018-11-08 DIAGNOSIS — Z79899 Other long term (current) drug therapy: Secondary | ICD-10-CM | POA: Diagnosis not present

## 2018-11-08 DIAGNOSIS — M238X1 Other internal derangements of right knee: Secondary | ICD-10-CM

## 2018-11-08 DIAGNOSIS — J449 Chronic obstructive pulmonary disease, unspecified: Secondary | ICD-10-CM | POA: Insufficient documentation

## 2018-11-08 DIAGNOSIS — K2970 Gastritis, unspecified, without bleeding: Secondary | ICD-10-CM | POA: Insufficient documentation

## 2018-11-08 DIAGNOSIS — R4702 Dysphasia: Secondary | ICD-10-CM

## 2018-11-08 HISTORY — PX: ESOPHAGOGASTRODUODENOSCOPY (EGD) WITH PROPOFOL: SHX5813

## 2018-11-08 SURGERY — ESOPHAGOGASTRODUODENOSCOPY (EGD) WITH PROPOFOL
Anesthesia: General

## 2018-11-08 MED ORDER — GLYCOPYRROLATE 0.2 MG/ML IJ SOLN
INTRAMUSCULAR | Status: DC | PRN
  Administered 2018-11-08: 0.1 mg via INTRAVENOUS

## 2018-11-08 MED ORDER — SODIUM CHLORIDE 0.9 % IV SOLN: INTRAVENOUS | Status: DC

## 2018-11-08 MED ORDER — LACTATED RINGERS IV SOLN
INTRAVENOUS | Status: DC
  Administered 2018-11-08: 10:00:00 via INTRAVENOUS

## 2018-11-08 MED ORDER — LIDOCAINE HCL (CARDIAC) PF 100 MG/5ML IV SOSY
PREFILLED_SYRINGE | INTRAVENOUS | Status: DC | PRN
  Administered 2018-11-08: 40 mg via INTRAVENOUS

## 2018-11-08 MED ORDER — PROPOFOL 10 MG/ML IV BOLUS
INTRAVENOUS | Status: DC | PRN
  Administered 2018-11-08: 40 mg via INTRAVENOUS
  Administered 2018-11-08: 30 mg via INTRAVENOUS
  Administered 2018-11-08: 150 mg via INTRAVENOUS
  Administered 2018-11-08: 40 mg via INTRAVENOUS

## 2018-11-08 MED ORDER — ACETAMINOPHEN 325 MG PO TABS: 650.0000 mg | ORAL_TABLET | Freq: Once | ORAL | Status: DC | PRN

## 2018-11-08 MED ORDER — ACETAMINOPHEN 160 MG/5ML PO SOLN: 325.0000 mg | ORAL | Status: DC | PRN

## 2018-11-08 MED ORDER — ONDANSETRON HCL 4 MG/2ML IJ SOLN: 4.0000 mg | Freq: Once | INTRAMUSCULAR | Status: DC | PRN

## 2018-11-08 SURGICAL SUPPLY — 7 items
BLOCK BITE 60FR ADLT L/F GRN (MISCELLANEOUS) ×3 IMPLANT
CANISTER SUCT 1200ML W/VALVE (MISCELLANEOUS) ×3 IMPLANT
GOWN CVR UNV OPN BCK APRN NK (MISCELLANEOUS) ×2 IMPLANT
GOWN ISOL THUMB LOOP REG UNIV (MISCELLANEOUS) ×6
KIT ENDO PROCEDURE OLY (KITS) ×3 IMPLANT
SYR INFLATION 60ML (SYRINGE) ×2 IMPLANT
WATER STERILE IRR 250ML POUR (IV SOLUTION) ×3 IMPLANT

## 2018-11-08 NOTE — Anesthesia Postprocedure Evaluation (Signed)
Anesthesia Post Note  Patient: Kristina Li  Procedure(s) Performed: ESOPHAGOGASTRODUODENOSCOPY (EGD) WITH PROPOFOL (N/A )  Patient location during evaluation: PACU Anesthesia Type: General Level of consciousness: awake and alert, oriented and patient cooperative Pain management: pain level controlled Vital Signs Assessment: post-procedure vital signs reviewed and stable Respiratory status: spontaneous breathing, nonlabored ventilation and respiratory function stable Cardiovascular status: blood pressure returned to baseline and stable Postop Assessment: adequate PO intake Anesthetic complications: no    Darrin Nipper

## 2018-11-08 NOTE — Transfer of Care (Signed)
Immediate Anesthesia Transfer of Care Note  Patient: Kristina Li  Procedure(s) Performed: ESOPHAGOGASTRODUODENOSCOPY (EGD) WITH PROPOFOL (N/A )  Patient Location: PACU  Anesthesia Type: General  Level of Consciousness: awake, alert  and patient cooperative  Airway and Oxygen Therapy: Patient Spontanous Breathing and Patient connected to supplemental oxygen  Post-op Assessment: Post-op Vital signs reviewed, Patient's Cardiovascular Status Stable, Respiratory Function Stable, Patent Airway and No signs of Nausea or vomiting  Post-op Vital Signs: Reviewed and stable  Complications: No apparent anesthesia complications

## 2018-11-08 NOTE — Op Note (Signed)
[facility] Patient Name: Kristina Li Procedure Date: 11/08/2018 9:37 AM MRN: [id number] Account #: [id number] Date of Birth: Feb 16, 1981 Admit Type: Outpatient Age: 37 Room: [facility] OR ROOM 01 Gender: Female Note Status: Finalized Procedure:            Upper GI endoscopy Indications:          Dysphagia Providers:            [name] MD, MD Referring MD:         [name], MD (Referring MD) Medicines:            Propofol per Anesthesia Complications:        No immediate complications. Procedure:            Pre-Anesthesia Assessment:                       - Prior to the procedure, a History and Physical was                        performed, and patient medications and allergies were                        reviewed. The patient's tolerance of previous                        anesthesia was also reviewed. The risks and benefits of                        the procedure and the sedation options and risks were                        discussed with the patient. All questions were                        answered, and informed consent was obtained. Prior                        Anticoagulants: The patient has taken no previous                        anticoagulant or antiplatelet agents. ASA Grade                        Assessment: II - A patient with mild systemic disease.                        After reviewing the risks and benefits, the patient was                        deemed in satisfactory condition to undergo the                        procedure.                       After obtaining informed consent, the endoscope was                        passed under direct vision. Throughout the procedure,  the patient's blood pressure, pulse, and oxygen                        saturations were monitored continuously. The was                        introduced through the mouth, and advanced to the                        second  part of duodenum. The upper GI endoscopy was                        accomplished without difficulty. The patient tolerated                        the procedure well. Findings:      The examined esophagus was normal. A TTS dilator was passed through the       scope. Dilation with a 15-16.5-18 mm balloon dilator was performed to 18       mm. The dilation site was examined following endoscope reinsertion and       showed complete resolution of luminal narrowing.      Localized mild inflammation characterized by erythema was found in the       gastric antrum.      The examined duodenum was normal. Impression:           - Normal esophagus. Dilated.                       - Gastritis.                       - Normal examined duodenum.                       - No specimens collected. Recommendation:       - Discharge patient to home.                       - Resume previous diet.                       - Continue present medications. Procedure Code(s):    --- Professional ---                       [phone], Esophagogastroduodenoscopy, flexible, transoral;                        with transendoscopic balloon dilation of esophagus                        (less than 30 mm diameter) Diagnosis Code(s):    --- Professional ---                       R13.10, Dysphagia, unspecified                       K29.70, Gastritis, unspecified, without bleeding CPT copyright 2018 American Medical Association. All rights reserved. The codes documented in this report are preliminary and upon coder review may  be revised to meet current compliance requirements. [name] MD, MD 11/08/2018 9:50:48 AM This report has been signed  electronically. Number of Addenda: 0 Note Initiated On: 11/08/2018 9:37 AM Total Procedure Duration: 0 hours 4 minutes 45 seconds       [facility]
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
## 2018-11-08 NOTE — Anesthesia Procedure Notes (Signed)
Date/Time: 11/08/2018 9:40 AM Performed by: Cameron Ali, CRNA Pre-anesthesia Checklist: Patient identified, Emergency Drugs available, Suction available, Timeout performed and Patient being monitored Patient Re-evaluated:Patient Re-evaluated prior to induction Oxygen Delivery Method: Nasal cannula Placement Confirmation: positive ETCO2

## 2018-11-09 ENCOUNTER — Encounter: Payer: Self-pay | Admitting: Gastroenterology

## 2018-11-24 ENCOUNTER — Ambulatory Visit
Admission: RE | Admit: 2018-11-24 | Discharge: 2018-11-24 | Disposition: A | Discharge status: Home / Self Care | Payer: Medicaid Other | Source: Ambulatory Visit | Attending: Nurse Practitioner | Admitting: Nurse Practitioner

## 2018-11-24 DIAGNOSIS — R609 Edema, unspecified: Secondary | ICD-10-CM

## 2018-11-24 DIAGNOSIS — M25561 Pain in right knee: Secondary | ICD-10-CM | POA: Diagnosis not present

## 2018-11-24 DIAGNOSIS — M25461 Effusion, right knee: Secondary | ICD-10-CM | POA: Insufficient documentation

## 2018-11-24 DIAGNOSIS — M238X1 Other internal derangements of right knee: Secondary | ICD-10-CM

## 2018-12-16 ENCOUNTER — Telehealth: Payer: Self-pay

## 2018-12-16 NOTE — Telephone Encounter (Signed)
Pt at first said she did not want to discuss this over the phone, I insisted of why the removal and she said she had blood work done at her PCP and her iron levels are now good, so she doesn't need it anymore.

## 2018-12-16 NOTE — Telephone Encounter (Signed)
Spoke with pt. Had IUD placed due to menorrhagia requiring infusions. Pt wants removed now. Told pt that menorrhagia caused anemia and sx will recur once IUD removed. No bleeding/cramping but had BV 10/19 and treated with metrogel. Sx recurred and pt thinks it's related to IUD. Also having urinary retention sx; had neg C&S 10/19 due to UTI sx. Told pt sx most likely not related to IUD and retention sx could be related to any of her mult meds. IUD very painful going in so pt wants pain meds. Instructed to take ibup 800 mg 1 hr before appt. Will see pt Mon.

## 2018-12-16 NOTE — Telephone Encounter (Signed)
Pt calling to see if pain medication can be called in for IUD removal on Monday.  Insertion was really painful.  [phone]

## 2018-12-16 NOTE — Telephone Encounter (Signed)
Pls call pt to find out why she is having IUD removed. Hx of severe menorrhagia with anemia requiring infusions with IUD placed to control bleeding. Is she still bleeding?  If she wants removed, what does she want to do for bleeding then?

## 2018-12-16 NOTE — Telephone Encounter (Signed)
I called pt to advise that removal is not as painful as insertion was, she is insisting for pain medication for day of removal.

## 2018-12-20 ENCOUNTER — Ambulatory Visit: Payer: Medicaid Other | Admitting: Obstetrics and Gynecology

## 2018-12-20 ENCOUNTER — Encounter: Payer: Self-pay | Admitting: Obstetrics and Gynecology

## 2018-12-20 VITALS — BP 130/80 | HR 99 | Ht 67.0 in | Wt 189.0 lb

## 2018-12-20 DIAGNOSIS — N760 Acute vaginitis: Secondary | ICD-10-CM | POA: Diagnosis not present

## 2018-12-20 DIAGNOSIS — R339 Retention of urine, unspecified: Secondary | ICD-10-CM

## 2018-12-20 DIAGNOSIS — B9689 Other specified bacterial agents as the cause of diseases classified elsewhere: Secondary | ICD-10-CM

## 2018-12-20 DIAGNOSIS — Z30431 Encounter for routine checking of intrauterine contraceptive device: Secondary | ICD-10-CM | POA: Diagnosis not present

## 2018-12-20 DIAGNOSIS — R1032 Left lower quadrant pain: Secondary | ICD-10-CM

## 2018-12-20 LAB — POCT WET PREP WITH KOH
Clue Cells Wet Prep HPF POC: POSITIVE
KOH Prep POC: POSITIVE — AB
Trichomonas, UA: NEGATIVE
Yeast Wet Prep HPF POC: NEGATIVE

## 2018-12-20 LAB — POCT URINALYSIS DIPSTICK
Bilirubin, UA: NEGATIVE
Blood, UA: NEGATIVE
Glucose, UA: NEGATIVE
Ketones, UA: NEGATIVE
Nitrite, UA: NEGATIVE
Protein, UA: NEGATIVE
Spec Grav, UA: 1.025 (ref 1.010–1.025)
pH, UA: 6 (ref 5.0–8.0)

## 2018-12-20 MED ORDER — CLINDAMYCIN PHOSPHATE 2 % VA CREA: 1.0000 | TOPICAL_CREAM | Freq: Every day | VAGINAL | 0 refills | Status: DC
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
## 2018-12-20 NOTE — Patient Instructions (Signed)
I value your feedback and entrusting us with your care. If you get a [facility] patient survey, I would appreciate you taking the time to let us know about your experience today. Thank you! 

## 2018-12-22 ENCOUNTER — Telehealth: Payer: Self-pay

## 2018-12-22 LAB — URINE CULTURE: ORGANISM ID, BACTERIA: NO GROWTH

## 2018-12-22 NOTE — Telephone Encounter (Signed)
Completed PA for clyndamycin vaginal cream.

## 2018-12-23 ENCOUNTER — Ambulatory Visit (INDEPENDENT_AMBULATORY_CARE_PROVIDER_SITE_OTHER): Payer: Medicaid Other

## 2018-12-23 ENCOUNTER — Telehealth: Payer: Self-pay | Admitting: Obstetrics and Gynecology

## 2018-12-23 DIAGNOSIS — Z30431 Encounter for routine checking of intrauterine contraceptive device: Secondary | ICD-10-CM | POA: Diagnosis not present

## 2018-12-23 DIAGNOSIS — R1032 Left lower quadrant pain: Secondary | ICD-10-CM

## 2018-12-23 NOTE — Telephone Encounter (Signed)
Called pt re: GYN u/s results. She states she would rather wait to get them at appt in office on 12/30/18.   Pt with bilat ovar cysts. Rechk u/s in 8 wks. Pt def has both ovaries. Not sure what procedure done in past giving her impression ovary was removed. Told pt I would be happy to review her past med records if she can get a copy of them.    ULTRASOUND REPORT  Location: [facility]  Date of Service: 12/23/2018   Patient Name: Kristina Li DOB: 10-29-1981 MRN: [id number]   Indications:LLQ PAIN, IUD CHECK Findings:  The uterus is anteverted and measures 7.63 X 4.50 X 3.71 CM. Echo texture is homogenous without evidence of focal masses.  The Endometrium measures 5.35 mm. - IUD is seen in correct position.  Right Ovary measures 4.27 X 3.82 X 2.09 cm. It contains 1)  isoechoic fibroid like area measure 1.9 x 1.9 cm, 2) Cystic area with nodules measure 2.5 x 1.3 x 2.0 cm Left Ovary measures 3.16 X 2.65 X 1.61 cm. It contain follicle vs simple cystic area 2.7 x 0.9 x 1.3 cm Survey of the adnexa demonstrates no adnexal masses. There is no free fluid in the cul de sac.  Impression: 1. IUD is seen in correct position. 2. Right ovary contains 1)  isoechoic fibroid like area measure 1.9 x 1.9 cm, 2) Cystic area with nodules measure 2.5 x 1.3 x 2.0 cm. 3.Left contain follicle vs simple cystic area 2.7 x 0.9 x 1.3 cm   Recommendations: 1.Clinical correlation with the patient's History and Physical Exam.  Mital bahen Marlowe Sax, RDMS

## 2018-12-27 ENCOUNTER — Ambulatory Visit: Payer: Medicaid Other | Admitting: Obstetrics and Gynecology

## 2018-12-29 ENCOUNTER — Other Ambulatory Visit: Payer: Self-pay | Admitting: Obstetrics and Gynecology

## 2018-12-29 DIAGNOSIS — B9689 Other specified bacterial agents as the cause of diseases classified elsewhere: Secondary | ICD-10-CM

## 2018-12-29 DIAGNOSIS — N760 Acute vaginitis: Principal | ICD-10-CM

## 2018-12-29 MED ORDER — METRONIDAZOLE 0.75 % VA GEL: 1.0000 | Freq: Every day | VAGINAL | 0 refills | Status: AC

## 2018-12-29 NOTE — Progress Notes (Signed)
Rx RF metrogel for BV. Could not get PA for clindamycin since hadn't done 2 rounds of preferred BV med (metronidazole).

## 2018-12-29 NOTE — Progress Notes (Signed)
Pt aware. She asked if IUD was going to be removed tomorrow and she wanted me to tell her results of her U/S. I advised [facility] would talk to her about this at appt tomorrow.

## 2018-12-30 ENCOUNTER — Ambulatory Visit: Payer: Medicaid Other | Admitting: Obstetrics and Gynecology
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
## 2019-01-03 ENCOUNTER — Telehealth: Payer: Self-pay | Admitting: Obstetrics and Gynecology

## 2019-01-03 NOTE — Telephone Encounter (Signed)
Spoke with pt about GYN u/s results. I tried to contact her 12/23/18 with results but she stated she didn't want to talk about them over the phone. She sched f/u appt 12/30/18 but didn't show.   I call pt again today and went over GYN u/s results. Pt with bilat ovar processes. Right ovary contains 1)  isoechoic fibroid like area measure 1.9 x 1.9 cm, 2) Cystic area with nodules measure 2.5 x 1.3 x 2.0 cm.; Left contain follicle vs simple cystic area 2.7 x 0.9 x 1.3 cm Had bilat ovar cysts on 6/19 u/s results (done 06/03/18 at [facility]). (Pt also states she was told in past one of her ovaries was removed, so pt confused as to why she has both ovaries. I told her I would review her records with her if she brought them in.) Pt thinks it's time for them to come out since she has had issues with them in the past and has had cystectomy. We discussed that usually simple cysts resolve in 8-12 wks and the ones we're currently seeing may or may not be the same as 6/19. Pt states now that she is having pelvic pains, but hasn't stated that until this phone call. Pt did have pelvic tenderness with GYN u/s. I suggested f/u GYN u/s in 4 wks as well as ca-125, and to f/u with Dr. Kenton Kingfisher to discuss mgmt options/poss surgery (he has seen her in past). Pt states she will call back to sched.   I also asked pt if BV sx were improving with metrogel. She states she has a d/c but would rather discuss this is in person and will call back to make appt. She asked if all results were given via phone and I told her we often do that, but since she missed her f/u appt, I needed to go over everything with her.   ULTRASOUND REPORT  Location: [facility]  Date of Service: 12/23/2018   Patient Name: Kristina Li DOB: 1981-09-26 MRN: [id number]   Indications:LLQ PAIN, IUD CHECK Findings:  The uterus is anteverted and measures 7.63 X 4.50 X 3.71 CM. Echo texture is homogenous without evidence of focal masses.  The  Endometrium measures 5.35 mm. - IUD is seen in correct position.  Right Ovary measures 4.27 X 3.82 X 2.09 cm. It contains 1)  isoechoic fibroid like area measure 1.9 x 1.9 cm, 2) Cystic area with nodules measure 2.5 x 1.3 x 2.0 cm Left Ovary measures 3.16 X 2.65 X 1.61 cm. It contain follicle vs simple cystic area 2.7 x 0.9 x 1.3 cm Survey of the adnexa demonstrates no adnexal masses. There is no free fluid in the cul de sac.  Impression: 1. IUD is seen in correct position. 2. Right ovary contains 1)  isoechoic fibroid like area measure 1.9 x 1.9 cm, 2) Cystic area with nodules measure 2.5 x 1.3 x 2.0 cm. 3.Left contain follicle vs simple cystic area 2.7 x 0.9 x 1.3 cm   Recommendations: 1.Clinical correlation with the patient's History and Physical Exam.  Mital bahen P Patel, RDMS   Will need repeat US in 4-8 weeks to evaluate ovarian cysts. Consider obtaining CA125.   IOTA- ADNEX Model Results 78.5%   Chance of benign tumor 21.5%   Risk of Malignancy  5.0%     Risk of Boderline 7.1%     Risk of Stage I ovarian cancer 7.7%     Risk of Stage II-IV ovarian cancer 1.8%    Risk  of Metastatic cancer to the adnexa  I have reviewed this ultrasound and the report. I agree with the above assessment and plan.  [facility] 12/29/18

## 2019-01-11 ENCOUNTER — Telehealth: Payer: Self-pay | Admitting: Obstetrics & Gynecology

## 2019-01-11 NOTE — Telephone Encounter (Signed)
Patient was seen 12/23/18 for ultrasound and Cancelled 12/27/18 and no showed 12/30/18 for follow up. Patient is calling due to being told by [facility] she needed to schedule an follow up appointment with MD due to poss Cyst.Per Notes per [facility] patient advised to follow up with Dr. Kenton Kingfisher. Is it ok to bring patient in on 01/17/19 per patient request without another ultrasound appointment order or would you prefer patient have ultrasound schedule at this appointment. Please Advise

## 2019-01-11 NOTE — Telephone Encounter (Signed)
Attempt to reach patient to schedule u/s per [facility]. Phone is not currently accepting calls at this time.

## 2019-01-17 ENCOUNTER — Encounter: Payer: Self-pay | Admitting: Obstetrics & Gynecology

## 2019-01-17 ENCOUNTER — Ambulatory Visit (INDEPENDENT_AMBULATORY_CARE_PROVIDER_SITE_OTHER): Payer: Medicaid Other | Admitting: Obstetrics & Gynecology

## 2019-01-17 VITALS — BP 120/80 | Ht 67.0 in | Wt 186.0 lb

## 2019-01-17 DIAGNOSIS — N83299 Other ovarian cyst, unspecified side: Secondary | ICD-10-CM

## 2019-01-17 DIAGNOSIS — Z30432 Encounter for removal of intrauterine contraceptive device: Secondary | ICD-10-CM | POA: Diagnosis not present

## 2019-01-17 DIAGNOSIS — N83291 Other ovarian cyst, right side: Secondary | ICD-10-CM

## 2019-01-17 DIAGNOSIS — R1032 Left lower quadrant pain: Secondary | ICD-10-CM

## 2019-01-17 DIAGNOSIS — N736 Female pelvic peritoneal adhesions (postinfective): Secondary | ICD-10-CM

## 2019-01-17 MED ORDER — DROSPIRENONE 4 MG PO TABS: 1.0000 | ORAL_TABLET | Freq: Every day | ORAL | 12 refills | Status: DC

## 2019-01-17 NOTE — Progress Notes (Signed)
History of Present Illness:  Kristina Li is a 38 y.o. that had a Mirena IUD placed approximately 9 months ago. Since that time, she states that she has chronic discharge, odor, pain, BV dx several times and she feels it is related to Mirena.  Desires removal.  Prior adhesive disease, severe on laparoscopy 2016, surgery to help and remove cyst then, has had prior left oophorectomy according to patient. Recent small cysts seen on [place] in 05/2018, 12/2018.  Pain is LLQ no radiation, no assoc sx's, no modifiers, moderate to mil;d pain.  Pt has h/o HTN and does not qualify for estrogen therapies.  She has had prior menorrhagia and anemia, the reason for her IUD being placed 07/2018.  The following portions of the patient's history were reviewed and updated as appropriate: allergies, current medications, past family history, past medical history, past social history, past surgical history and problem list.  Patient Active Problem List   Diagnosis Date Noted  . 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 Functional ovarian cysts 01/17/2019  . Problems with swallowing and mastication   . Dysphasia   . Acute gastritis without hemorrhage   . Iron deficiency anemia 06/20/2018  . Undifferentiated schizophrenia ([place]) 04/15/2017  . Rectal polyp   . First degree hemorrhoids   . Hemorrhage of rectum and anus   . Major depressive disorder, recurrent, severe with psychotic features ([place]) 08/15/2016  . HTN (hypertension) 08/15/2016  . COPD (chronic obstructive pulmonary disease) ([place]) 08/15/2016  . 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 DDD (degenerative disc disease), lumbar 03/18/2016  . Facet syndrome, lumbar 03/18/2016  . Lumbar radiculopathy 03/18/2016  . DJD of shoulder 03/05/2016  . Cervicalgia 01/31/2016  . Sacroiliac joint dysfunction 01/31/2016  . Myofascial pain 01/31/2016  . Fibromyalgia 01/31/2016  . Bilateral occipital neuralgia 01/31/2016  . Thyroid cancer ([place])   . Post traumatic stress  disorder (PTSD)   . Gastroesophageal reflux disease   . Endometriosis    Medications:  Current Outpatient Medications on File Prior to Visit  Medication Sig Dispense Refill  . albuterol (PROVENTIL HFA;VENTOLIN HFA) 108 (90 Base) MCG/ACT inhaler Inhale 1-2 puffs into the lungs every 6 (six) hours as needed for wheezing or shortness of breath. 1 Inhaler 0  . amitriptyline (ELAVIL) 25 MG tablet Take 2 tablets (50 mg total) by mouth at bedtime. 30 tablet 0  . amLODipine (NORVASC) 5 MG tablet Take 5 mg by mouth daily.    . Butalbital-APAP-Caffeine 50-300-40 MG CAPS Take by mouth as needed.    . cetirizine-pseudoephedrine (ZYRTEC-D) 5-120 MG tablet cetirizine 5 mg-pseudoephedrine ER 120 mg tablet,extended release,12hr    . clindamycin (CLEOCIN) 2 % vaginal cream Place 1 Applicatorful vaginally at bedtime. 40 g 0  . ferrous sulfate 325 (65 FE) MG EC tablet Take 325 mg by mouth daily with breakfast.    . fluticasone (FLOVENT HFA) 110 MCG/ACT inhaler Inhale 2 puffs into the lungs 2 (two) times daily.    . hydrocortisone-pramoxine (ANALPRAM-HC) 2.5-1 % rectal cream Place 1 application rectally 3 (three) times daily. 30 g 0  . hydrOXYzine (ATARAX/VISTARIL) 10 MG tablet Take 50 mg by mouth at bedtime as needed for anxiety.    [name] levothyroxine (SYNTHROID, LEVOTHROID) 88 MCG tablet   2  . liothyronine (CYTOMEL) 5 MCG tablet Take 5 mcg by mouth daily.    . meloxicam (MOBIC) 7.5 MG tablet Mobic 7.5 mg tablet  Take 1 tablet twice a day by oral route.    . metoprolol succinate (TOPROL-XL) 50  MG 24 hr tablet Take 1 tablet (50 mg total) by mouth daily. Reported on 04/16/2016 30 tablet 5  . ondansetron (ZOFRAN-ODT) 4 MG disintegrating tablet ondansetron 4 mg disintegrating tablet    . pantoprazole (PROTONIX) 20 MG tablet Take 20 mg by mouth 2 (two) times daily.    . SUMAtriptan (IMITREX) 25 MG tablet Take 25 mg by mouth every 2 (two) hours as needed for migraine. May repeat in 2 hours if headache persists or  recurs.    . tapentadol (NUCYNTA) 50 MG tablet Take 50 mg by mouth 2 (two) times daily.    [name] tiZANidine (ZANAFLEX) 2 MG tablet Take 2 mg by mouth every 8 (eight) hours as needed for muscle spasms.    . traMADol (ULTRAM) 50 MG tablet Take by mouth every 6 (six) hours as needed.    [name] levonorgestrel (MIRENA, 52 MG,) 20 MCG/24HR IUD 1 Intra Uterine Device (1 each total) by Intrauterine route once for 1 dose. 1 Intra Uterine Device 0   No current facility-administered medications on file prior to visit.    Allergies: is allergic to latex; shellfish allergy; and tape.  Physical Exam:  BP 120/80   Ht 5\' 7"  (1.702 m)   Wt 186 lb (84.4 kg)   BMI 29.13 kg/m  Body mass index is 29.13 kg/m. Constitutional: Well nourished, well developed female in no acute distress.  Abdomen: diffusely non tender to palpation, non distended, and no masses, hernias Neuro: Grossly intact Psych:  Normal mood and affect.    Pelvic exam:  Two IUD strings present seen coming from the cervical os. EGBUS, vaginal vault and cervix: within normal limits  IUD Removal Strings of IUD identified and grasped.  IUD removed without problem.  Pt tolerated this well.  IUD noted to be intact.  Assessment: IUD Removal  Plan: IUD removed and plan for contraception is oral progesterone-only contraceptive. She was amenable to this plan. Plan to use Slynd (prog only) to prevent menorrhagia and prevent pregnancy. [place] in one month to re-assess cysts.  Counseled risks of surgery based on her history.  Would refer to [facility] surgery [facility].  A total of 25 minutes were spent face-to-face with the patient during this encounter and over half of that time dealt with counseling and coordination of care.  Complex history and multiple diverse decision making and planning for her conditions.  Barnett Applebaum, MD, [facility], [facility] 01/17/2019  4:46 PM

## 2019-01-17 NOTE — Patient Instructions (Signed)
Drospirenone tablets (contraception) What is this medicine? DROSPIRENONE (dro SPY re nown) is an oral contraceptive (birth control pill). The product contains a female hormone known as a progestin. It is used to prevent pregnancy. This medicine may be used for other purposes; ask your health care provider or pharmacist if you have questions. COMMON BRAND NAME(S): SLYND What should I tell my health care provider before I take this medicine? They need to know if you have any of these conditions: -abnormal vaginal bleeding -adrenal gland disease -blood vessel disease or blood clots -breast, cervical, endometrial, ovarian, liver, or uterine cancer -diabetes -heart disease or recent heart attack -high potassium level -kidney disease -liver disease -mental depression -migraine headaches -stroke -an unusual or allergic reaction to drospirenone, progestins, or other medicines, foods, dyes, or preservatives -pregnant or trying to get pregnant -breast-feeding How should I use this medicine? Take this medicine by mouth. To reduce nausea, this medicine may be taken with food. Follow the directions on the prescription label. Take this medicine at the same time each day and in the order directed on the package. Do not take your medicine more often than directed. A patient package insert for the product will be given with each prescription and refill. Read this sheet carefully each time. The sheet may change frequently. Talk to your pediatrician regarding the use of this medicine in children. Special care may be needed. This medicine has been used in female children who have started having menstrual periods. Overdosage: If you think you have taken too much of this medicine contact a poison control center or emergency room at once. NOTE: This medicine is only for you. Do not share this medicine with others. What if I miss a dose? If you miss a dose, take it as soon as you can and refer to the patient  information sheet you received with your medicine for direction. If you miss more than one pill, this medicine may not be as effective and you may need to use another form of birth control. What may interact with this medicine? Do not take this medicine with any of the following medications: -atazanavir; cobicistat -bosentan -fosamprenavir This medicine may also interact with the following medications: -aprepitant -barbiturates like phenobarbital, primidone -carbamazepine -certain antibiotics like clarithromycin, rifampin, rifabutin, rifapentine -certain antivirals for HIV or hepatitis -certain diuretics like amiloride, spironolactone, triamterene -certain medicines for fungal infections like griseofulvin, ketoconazole, itraconazole, voriconazole -certain medicines for blood pressure, heart disease -cyclosporine -felbamate -heparin -medicines for diabetes -modafinil -NSAIDs, medicines for pain and inflammation, like ibuprofen or naproxen -oxcarbazepine -phenytoin -potassium supplements -rufinamide -St. John's wort -topiramate This list may not describe all possible interactions. Give your health care provider a list of all the medicines, herbs, non-prescription drugs, or dietary supplements you use. Also tell them if you smoke, drink alcohol, or use illegal drugs. Some items may interact with your medicine. What should I watch for while using this medicine? Visit your doctor or health care professional for regular checks on your progress. You will need a regular breast and pelvic exam and Pap smear while on this medicine. You may need blood work done while you are taking this medicine. If you have any reason to think you are pregnant, stop taking this medicine right away and contact your doctor or health care professional. This medicine does not protect you against HIV infection (AIDS) or any other sexually transmitted diseases. If you are going to have elective surgery, you may need  to stop taking this medicine before  the surgery. Consult your health care professional for advice. What side effects may I notice from receiving this medicine? Side effects that you should report to your doctor or health care professional as soon as possible: -allergic reactions like skin rash, itching or hives, swelling of the face, lips, or tongue -breast tissue changes or discharge -depressed mood -severe pain, swelling, or tenderness in the abdomen -signs and symptoms of a blood clot such as chest pain; shortness of breath; pain, swelling, or warmth in the leg -signs and symptoms of increased potassium like muscle weakness; chest pain; or fast, irregular heartbeat -signs and symptoms of liver injury like dark yellow or brown urine; general ill feeling or flu-like symptoms; light-colored stools; loss of appetite; nausea; right upper belly pain; unusually weak or tired; yellowing of the eyes or skin -signs and symptoms of a stroke like changes in vision; confusion; trouble speaking or understanding; severe headaches; sudden numbness or weakness of the face, arm or leg; trouble walking; dizziness; loss of balance or coordination -unusual vaginal bleeding -unusually weak or tired Side effects that usually do not require medical attention (report these to your doctor or health care professional if they continue or are bothersome): -acne -breast tenderness -headache -menstrual cramps -nausea -weight gain This list may not describe all possible side effects. Call your doctor for medical advice about side effects. You may report side effects to FDA at 1-800-FDA-1088. Where should I keep my medicine? Keep out of the reach of children. Store at room temperature between 20 and 25 degrees C (68 and 77 degrees F). Throw away any unused medicine after the expiration date. NOTE: This sheet is a summary. It may not cover all possible information. If you have questions about this medicine, talk to your  doctor, pharmacist, or health care provider.  2019 Elsevier/Gold Standard (2018-05-05 15:01:56)
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
## 2019-01-25 ENCOUNTER — Inpatient Hospital Stay: Payer: Medicaid Other

## 2019-01-25 ENCOUNTER — Inpatient Hospital Stay: Payer: Medicaid Other | Admitting: Oncology

## 2019-01-31 ENCOUNTER — Other Ambulatory Visit: Payer: Self-pay | Admitting: Obstetrics & Gynecology

## 2019-02-14 ENCOUNTER — Ambulatory Visit (INDEPENDENT_AMBULATORY_CARE_PROVIDER_SITE_OTHER): Payer: Medicaid Other | Admitting: Obstetrics & Gynecology

## 2019-02-14 ENCOUNTER — Encounter: Payer: Self-pay | Admitting: Obstetrics & Gynecology

## 2019-02-14 ENCOUNTER — Ambulatory Visit (INDEPENDENT_AMBULATORY_CARE_PROVIDER_SITE_OTHER): Payer: Medicaid Other

## 2019-02-14 VITALS — BP 130/80 | Ht 68.0 in | Wt 184.0 lb

## 2019-02-14 DIAGNOSIS — R1032 Left lower quadrant pain: Secondary | ICD-10-CM

## 2019-02-14 DIAGNOSIS — N83292 Other ovarian cyst, left side: Secondary | ICD-10-CM

## 2019-02-14 DIAGNOSIS — R3989 Other symptoms and signs involving the genitourinary system: Secondary | ICD-10-CM

## 2019-02-14 DIAGNOSIS — N736 Female pelvic peritoneal adhesions (postinfective): Secondary | ICD-10-CM

## 2019-02-14 DIAGNOSIS — N83299 Other ovarian cyst, unspecified side: Secondary | ICD-10-CM

## 2019-02-14 LAB — POCT URINALYSIS DIPSTICK
Bilirubin, UA: NEGATIVE
Blood, UA: NEGATIVE
Glucose, UA: NEGATIVE
Ketones, UA: NEGATIVE
Leukocytes, UA: NEGATIVE
Nitrite, UA: NEGATIVE
Protein, UA: NEGATIVE
SPEC GRAV UA: 1.01 (ref 1.010–1.025)
UROBILINOGEN UA: 0.2 U/dL
pH, UA: 5 (ref 5.0–8.0)

## 2019-02-14 NOTE — Progress Notes (Signed)
HPI: Pt reports some lower abd pressure, and sometimes has to strain to void.  She has min pain.  No bleeding.  She has transitioned to Drospiranone (prog only) pill after Verdia Kuba removed 2 mos ago.  Ultrasound demonstrates no change in size of left cystic area; was one then 2 cysts but may be nature of cyst in its appearance with same size.  See below.  PMHx: She  has a past medical history of Anemia, Anxiety, Anxiety, Arthritis, Asthma, Chest pain, Chronic pain syndrome, COPD (chronic obstructive pulmonary disease) ([place]), Depression, Dyspnea on exertion, Dysrhythmia, Endometriosis, Fibromyalgia, Gastroesophageal reflux disease, Headache, Heart murmur, Hypertension, Hypothyroidism, Mitral regurgitation, MRSA (methicillin resistant Staphylococcus aureus) (2008), MVP (mitral valve prolapse), Nonrheumatic mitral valve disorder, Occipital neuralgia, Other cervical disc degeneration, unspecified cervical region, Palpitations, Post traumatic stress disorder (PTSD), Scoliosis, and Thyroid cancer ([place]). Also,  has a past surgical history that includes Cystectomy; Thyroidectomy; Incision and drainage of wound; Carpal tunnel release (02/10/2012); Carpal tunnel release (03/19/2012); Ectopic pregnancy surgery; Cholecystectomy; laparoscopy (N/A, 04/19/2015); Chromopertubation (N/A, 04/19/2015); Laparoscopic lysis of adhesions (04/19/2015); Laparoscopic unilateral salpingectomy (Left, 04/19/2015); Colonoscopy with propofol (N/A, 02/19/2017); Esophagogastroduodenoscopy (egd) with propofol (N/A, 09/09/2018); Esophageal dilation (N/A, 09/09/2018); and Esophagogastroduodenoscopy (egd) with propofol (N/A, 11/08/2018)., family history includes Alcohol abuse in her maternal grandfather; Arthritis in her maternal grandfather, maternal grandmother, maternal uncle, mother, paternal grandfather, paternal grandmother, and paternal uncle; Asthma in her mother; Cancer in her mother, paternal aunt, and paternal grandmother; Depression in her  maternal grandmother; Hypertension in her maternal grandmother and paternal grandmother; Mental illness in her father, mother, and paternal uncle; Stroke in her maternal grandfather.,  reports that she has never smoked. She has never used smokeless tobacco. She reports that she does not drink alcohol or use drugs.  She has a current medication list which includes the following prescription(s): albuterol, amitriptyline, amlodipine, butalbital-apap-caffeine, cetirizine-pseudoephedrine, clindamycin, drospirenone, ferrous sulfate, fluticasone, hydrocortisone-pramoxine, hydroxyzine, levothyroxine, liothyronine, meloxicam, metoprolol succinate, ondansetron, pantoprazole, sumatriptan, tapentadol, tizanidine, and tramadol. Also, is allergic to latex; shellfish allergy; and tape.  Review of Systems  All other systems reviewed and are negative.   Objective: BP 130/80   Ht 5\' 8"  (1.727 m)   Wt 184 lb (83.5 kg)   BMI 27.98 kg/m   Physical examination Constitutional NAD, Conversant  Skin No rashes, lesions or ulceration.   Extremities: Moves all appropriately.  Normal ROM for age. No lymphadenopathy.  Neuro: Grossly intact  Psych: Oriented to PPT.  Normal mood. Normal affect.   US Pelvis Transvanginal Non-ob (tv Only)  Result Date: 02/14/2019 Patient Name: Kristina Li DOB: [birth year] MRN: [id number] ULTRASOUND REPORT Location: [facility] Date of Service: 02/14/2019 Indications: Follow up ovarian cysts Findings: The uterus is retroverted and measures 8.0 x 4.8 x 3.6cm. Echo texture is homogenous without evidence of focal masses. The Endometrium measures 5.4 mm. Right Ovary measures 3.6 x 2.6 x 1.4 cm. It is normal in appearance. Left Ovary measures 3.7 x 2.9 x 2.0 cm with two cysts seen measuring 2.4cm and 1.9cm Patient unsure of surgical history. Thought she had left ovary removed; however, cystic area seen in left ovary area. Left ovary with cysts vs cystic area being hydrosalpinx vs other.    Survey of the adnexa demonstrates no adnexal masses. There is no free fluid in the cul de sac. Impression: 1. Two left ovarian cysts seen vs hydrosalpinx. May be one cyst w septation 2. Right ovarian cysts have resolved.  Recommendations: 1.Clinical correlation with the patient's History and Physical Exam. Abby  Despina Arias, RDMS RVT Review of ULTRASOUND.    I have personally reviewed images and report of recent ultrasound done at [facility].    Plan of management to be discussed with patient. Barnett Applebaum, MD, [facility], [facility] 02/14/2019  3:53 PM   UA neg  Assessment:  LLQ pain  Functional ovarian cysts Pelvic adhesive disease    No change.  High surgical risk.  Low likelihood surgery would improve mild pains or pressure sensations.  Cysts small.  Rec continued progesterone therapy and monitoring for any worsening to sx's  Sensation of pressure in bladder area - Plan: Ambulatory referral to Urology Will have uro assess for any additional etiology to be concerned about  A total of 15 minutes were spent face-to-face with the patient during this encounter and over half of that time dealt with counseling and coordination of care.  Barnett Applebaum, MD, [facility], [facility] 02/14/2019  4:08 PM

## 2019-03-02 DIAGNOSIS — K2100 Gastro-esophageal reflux disease with esophagitis, without bleeding: Secondary | ICD-10-CM | POA: Insufficient documentation

## 2019-03-09 ENCOUNTER — Ambulatory Visit: Payer: Self-pay | Admitting: Urology

## 2019-06-08 ENCOUNTER — Ambulatory Visit: Payer: Self-pay | Admitting: Urology

## 2019-07-01 ENCOUNTER — Encounter: Payer: Self-pay | Admitting: Emergency Medicine

## 2019-07-01 ENCOUNTER — Emergency Department: Payer: Medicaid Other

## 2019-07-01 ENCOUNTER — Other Ambulatory Visit: Payer: Self-pay

## 2019-07-01 ENCOUNTER — Emergency Department
Admission: EM | Admit: 2019-07-01 | Discharge: 2019-07-01 | Disposition: A | Discharge status: Home / Self Care | Payer: Medicaid Other | Attending: Emergency Medicine | Admitting: Emergency Medicine

## 2019-07-01 DIAGNOSIS — Z20828 Contact with and (suspected) exposure to other viral communicable diseases: Secondary | ICD-10-CM | POA: Insufficient documentation

## 2019-07-01 DIAGNOSIS — Z91040 Latex allergy status: Secondary | ICD-10-CM | POA: Insufficient documentation

## 2019-07-01 DIAGNOSIS — I10 Essential (primary) hypertension: Secondary | ICD-10-CM | POA: Diagnosis not present

## 2019-07-01 DIAGNOSIS — Z79899 Other long term (current) drug therapy: Secondary | ICD-10-CM | POA: Insufficient documentation

## 2019-07-01 DIAGNOSIS — J449 Chronic obstructive pulmonary disease, unspecified: Secondary | ICD-10-CM | POA: Diagnosis not present

## 2019-07-01 DIAGNOSIS — E039 Hypothyroidism, unspecified: Secondary | ICD-10-CM | POA: Insufficient documentation

## 2019-07-01 DIAGNOSIS — M79605 Pain in left leg: Secondary | ICD-10-CM | POA: Insufficient documentation

## 2019-07-01 DIAGNOSIS — J45909 Unspecified asthma, uncomplicated: Secondary | ICD-10-CM | POA: Diagnosis not present

## 2019-07-01 DIAGNOSIS — R0602 Shortness of breath: Secondary | ICD-10-CM | POA: Diagnosis not present

## 2019-07-01 DIAGNOSIS — Z791 Long term (current) use of non-steroidal anti-inflammatories (NSAID): Secondary | ICD-10-CM | POA: Insufficient documentation

## 2019-07-01 LAB — CBC WITH DIFFERENTIAL/PLATELET
Abs Immature Granulocytes: 0.04 10*3/uL (ref 0.00–0.07)
Basophils Absolute: 0 10*3/uL (ref 0.0–0.1)
Basophils Relative: 1 %
Eosinophils Absolute: 0 10*3/uL (ref 0.0–0.5)
Eosinophils Relative: 1 %
HCT: 38.8 % (ref 36.0–46.0)
Hemoglobin: 12.3 g/dL (ref 12.0–15.0)
Immature Granulocytes: 1 %
Lymphocytes Relative: 28 %
Lymphs Abs: 1.8 10*3/uL (ref 0.7–4.0)
MCH: 28.7 pg (ref 26.0–34.0)
MCHC: 31.7 g/dL (ref 30.0–36.0)
MCV: 90.4 fL (ref 80.0–100.0)
Monocytes Absolute: 0.6 10*3/uL (ref 0.1–1.0)
Monocytes Relative: 9 %
Neutro Abs: 4 10*3/uL (ref 1.7–7.7)
Neutrophils Relative %: 60 %
Platelets: 242 10*3/uL (ref 150–400)
RBC: 4.29 MIL/uL (ref 3.87–5.11)
RDW: 14.4 % (ref 11.5–15.5)
WBC: 6.5 10*3/uL (ref 4.0–10.5)
nRBC: 0 % (ref 0.0–0.2)

## 2019-07-01 LAB — URINALYSIS, ROUTINE W REFLEX MICROSCOPIC
Bilirubin Urine: NEGATIVE
Glucose, UA: NEGATIVE mg/dL
Hgb urine dipstick: NEGATIVE
Ketones, ur: NEGATIVE mg/dL
Leukocytes,Ua: NEGATIVE
Nitrite: NEGATIVE
Protein, ur: NEGATIVE mg/dL
Specific Gravity, Urine: 1.005 (ref 1.005–1.030)
pH: 7 (ref 5.0–8.0)

## 2019-07-01 LAB — COMPREHENSIVE METABOLIC PANEL
ALT: 35 U/L (ref 0–44)
AST: 29 U/L (ref 15–41)
Albumin: 4.2 g/dL (ref 3.5–5.0)
Alkaline Phosphatase: 63 U/L (ref 38–126)
Anion gap: 7 (ref 5–15)
BUN: 13 mg/dL (ref 6–20)
CO2: 25 mmol/L (ref 22–32)
Calcium: 9.1 mg/dL (ref 8.9–10.3)
Chloride: 107 mmol/L (ref 98–111)
Creatinine, Ser: 1.06 mg/dL — ABNORMAL HIGH (ref 0.44–1.00)
GFR calc Af Amer: >60 mL/min (ref >60)
GFR calc non Af Amer: >60 mL/min (ref >60)
Glucose, Bld: 88 mg/dL (ref 70–99)
Potassium: 4.1 mmol/L (ref 3.5–5.1)
Sodium: 139 mmol/L (ref 135–145)
Total Bilirubin: 0.7 mg/dL (ref 0.3–1.2)
Total Protein: 7.3 g/dL (ref 6.5–8.1)

## 2019-07-01 LAB — TROPONIN I (HIGH SENSITIVITY): Troponin I (High Sensitivity): <2 ng/L (ref <18)

## 2019-07-01 LAB — SARS CORONAVIRUS 2 BY RT PCR (HOSPITAL ORDER, PERFORMED IN ~~LOC~~ HOSPITAL LAB): SARS Coronavirus 2: NEGATIVE

## 2019-07-01 LAB — PREGNANCY, URINE: Preg Test, Ur: NEGATIVE

## 2019-07-01 MED ORDER — IOHEXOL 350 MG/ML SOLN
75.0000 mL | Freq: Once | INTRAVENOUS | Status: AC | PRN
  Administered 2019-07-01: 75 mL via INTRAVENOUS
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
## 2019-07-01 NOTE — ED Triage Notes (Signed)
Pt to ED with c/o of SOB that has been ongoing for two weeks. Pt has hx of SOB but states increased and has c/o of fatigue. Pt also has c/o of swelling in her left leg.

## 2019-07-25 ENCOUNTER — Telehealth: Payer: Self-pay | Admitting: Gastroenterology

## 2019-07-25 NOTE — Telephone Encounter (Signed)
Patient called in stating  she is feeling full,bloated like her food isn't going all the way down and having slow bowel movements . Please advise. She uses the [facility] in [place].

## 2019-08-03 ENCOUNTER — Ambulatory Visit (INDEPENDENT_AMBULATORY_CARE_PROVIDER_SITE_OTHER): Payer: Medicaid Other | Admitting: Urology

## 2019-08-03 ENCOUNTER — Encounter: Payer: Self-pay | Admitting: Urology

## 2019-08-03 ENCOUNTER — Other Ambulatory Visit: Payer: Self-pay

## 2019-08-03 VITALS — BP 116/75 | HR 103 | Ht 67.0 in | Wt 217.6 lb

## 2019-08-03 DIAGNOSIS — R3989 Other symptoms and signs involving the genitourinary system: Secondary | ICD-10-CM

## 2019-08-03 DIAGNOSIS — M7070 Other bursitis of hip, unspecified hip: Secondary | ICD-10-CM | POA: Insufficient documentation

## 2019-08-03 DIAGNOSIS — M7540 Impingement syndrome of unspecified shoulder: Secondary | ICD-10-CM | POA: Insufficient documentation

## 2019-08-03 LAB — MICROSCOPIC EXAMINATION: RBC: NONE SEEN /hpf (ref 0–2)

## 2019-08-03 LAB — URINALYSIS, COMPLETE
Bilirubin, UA: NEGATIVE
Glucose, UA: NEGATIVE
Ketones, UA: NEGATIVE
Leukocytes,UA: NEGATIVE
Nitrite, UA: NEGATIVE
Protein,UA: NEGATIVE
RBC, UA: NEGATIVE
Specific Gravity, UA: 1.02 (ref 1.005–1.030)
Urobilinogen, Ur: 0.2 mg/dL (ref 0.2–1.0)
pH, UA: 8 — ABNORMAL HIGH (ref 5.0–7.5)

## 2019-08-03 LAB — BLADDER SCAN AMB NON-IMAGING
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
## 2019-08-03 NOTE — Patient Instructions (Signed)
Cystoscopy Cystoscopy is a procedure that is used to help diagnose and sometimes treat conditions that affect the lower urinary tract. The lower urinary tract includes the bladder and the urethra. The urethra is the tube that drains urine from the bladder. Cystoscopy is done using a thin, tube-shaped instrument with a light and camera at the end (cystoscope). The cystoscope may be hard or flexible, depending on the goal of the procedure. The cystoscope is inserted through the urethra, into the bladder. Cystoscopy may be recommended if you have:  Urinary tract infections that keep coming back.  Blood in the urine (hematuria).  An inability to control when you urinate (urinary incontinence) or an overactive bladder.  Unusual cells found in a urine sample.  A blockage in the urethra, such as a urinary stone.  Slow or painful urination.  An abnormality in the bladder found during an intravenous pyelogram (IVP) or CT scan. Cystoscopy may also be done to remove a sample of tissue to be examined under a microscope (biopsy). Tell a health care provider about:  Any allergies you have.  All medicines you are taking, including vitamins, herbs, eye drops, creams, and over-the-counter medicines.  Any problems you or family members have had with anesthetic medicines.  Any blood disorders you have.  Any surgeries you have had.  Any medical conditions you have.  Whether you are pregnant or may be pregnant. What are the risks? Generally, this is a safe procedure. However, problems may occur, including:  Infection.  Bleeding.  Allergic reactions to medicines.  Damage to other structures or organs. What happens before the procedure?  Ask your health care provider about: ? Changing or stopping your regular medicines. This is especially important if you are taking diabetes medicines or blood thinners. ? Taking medicines such as aspirin and ibuprofen. These medicines can thin your blood. Do  not take these medicines unless your health care provider tells you to take them. ? Taking over-the-counter medicines, vitamins, herbs, and supplements.  Follow instructions from your health care provider about eating or drinking restrictions.  Ask your health care provider what steps will be taken to help prevent infection. These may include: ? Washing skin with a germ-killing soap. ? Taking antibiotic medicine.  You may have an exam or testing, such as: ? X-rays of the bladder, urethra, or kidneys. ? Urine tests to check for signs of infection.  Plan to have someone take you home from the hospital or clinic. What happens during the procedure?   You will be given one or more of the following: ? A medicine to help you relax (sedative). ? A medicine to numb the area (local anesthetic).  The area around the opening of your urethra will be cleaned.  The cystoscope will be passed through your urethra into your bladder.  Germ-free (sterile) fluid will flow through the cystoscope to fill your bladder. The fluid will stretch your bladder so that your health care provider can clearly examine your bladder walls.  Your doctor will look at the urethra and bladder. Your doctor may take a biopsy or remove stones.  The cystoscope will be removed, and your bladder will be emptied. The procedure may vary among health care providers and hospitals. What can I expect after the procedure? After the procedure, it is common to have:  Some soreness or pain in your abdomen and urethra.  Urinary symptoms. These include: ? Mild pain or burning when you urinate. Pain should stop within a few minutes after you  urinate. This may last for up to 1 week. ? A small amount of blood in your urine for several days. ? Feeling like you need to urinate but producing only a small amount of urine. Follow these instructions at home: Medicines  Take over-the-counter and prescription medicines only as told by your  health care provider.  If you were prescribed an antibiotic medicine, take it as told by your health care provider. Do not stop taking the antibiotic even if you start to feel better. General instructions  Return to your normal activities as told by your health care provider. Ask your health care provider what activities are safe for you.  Do not drive for 24 hours if you were given a sedative during your procedure.  Watch for any blood in your urine. If the amount of blood in your urine increases, call your health care provider.  Follow instructions from your health care provider about eating or drinking restrictions.  If a tissue sample was removed for testing (biopsy) during your procedure, it is up to you to get your test results. Ask your health care provider, or the department that is doing the test, when your results will be ready.  Drink enough fluid to keep your urine pale yellow.  Keep all follow-up visits as told by your health care provider. This is important. Contact a health care provider if you:  Have pain that gets worse or does not get better with medicine, especially pain when you urinate.  Have trouble urinating.  Have more blood in your urine. Get help right away if you:  Have blood clots in your urine.  Have abdominal pain.  Have a fever or chills.  Are unable to urinate. Summary  Cystoscopy is a procedure that is used to help diagnose and sometimes treat conditions that affect the lower urinary tract.  Cystoscopy is done using a thin, tube-shaped instrument with a light and camera at the end.  After the procedure, it is common to have some soreness or pain in your abdomen and urethra.  Watch for any blood in your urine. If the amount of blood in your urine increases, call your health care provider.  If you were prescribed an antibiotic medicine, take it as told by your health care provider. Do not stop taking the antibiotic even if you start to feel  better. This information is not intended to replace advice given to you by your health care provider. Make sure you discuss any questions you have with your health care provider. Document Released: 11/21/2000 Document Revised: 11/16/2018 Document Reviewed: 11/16/2018 Elsevier Patient Education  2020 [employer].

## 2019-08-30 ENCOUNTER — Telehealth: Payer: Self-pay | Admitting: Urology

## 2019-08-30 NOTE — Telephone Encounter (Signed)
Pt called to cancel cysto appt for tomorrow and didn't want to r/s.

## 2019-08-31 ENCOUNTER — Other Ambulatory Visit: Payer: Medicaid Other | Admitting: Urology

## 2019-10-18 ENCOUNTER — Other Ambulatory Visit: Payer: Self-pay | Admitting: Nurse Practitioner

## 2019-10-18 DIAGNOSIS — R52 Pain, unspecified: Secondary | ICD-10-CM

## 2019-10-18 DIAGNOSIS — R600 Localized edema: Secondary | ICD-10-CM

## 2019-10-20 ENCOUNTER — Ambulatory Visit: Payer: Medicaid Other

## 2019-10-24 ENCOUNTER — Ambulatory Visit
Admission: RE | Admit: 2019-10-24 | Discharge: 2019-10-24 | Disposition: A | Discharge status: Home / Self Care | Payer: Medicaid Other | Source: Ambulatory Visit | Attending: Nurse Practitioner | Admitting: Nurse Practitioner

## 2019-10-24 ENCOUNTER — Encounter (INDEPENDENT_AMBULATORY_CARE_PROVIDER_SITE_OTHER): Payer: Self-pay

## 2019-10-24 ENCOUNTER — Other Ambulatory Visit: Payer: Self-pay

## 2019-10-24 DIAGNOSIS — R600 Localized edema: Secondary | ICD-10-CM | POA: Diagnosis present

## 2019-10-24 DIAGNOSIS — R52 Pain, unspecified: Secondary | ICD-10-CM | POA: Diagnosis present

## 2019-11-29 ENCOUNTER — Other Ambulatory Visit: Payer: Self-pay

## 2019-11-29 ENCOUNTER — Emergency Department: Payer: Medicaid Other

## 2019-11-29 ENCOUNTER — Emergency Department
Admission: EM | Admit: 2019-11-29 | Discharge: 2019-11-29 | Disposition: A | Discharge status: Home / Self Care | Payer: Medicaid Other | Attending: Emergency Medicine | Admitting: Emergency Medicine

## 2019-11-29 DIAGNOSIS — Z923 Personal history of irradiation: Secondary | ICD-10-CM | POA: Insufficient documentation

## 2019-11-29 DIAGNOSIS — Z5321 Procedure and treatment not carried out due to patient leaving prior to being seen by health care provider: Secondary | ICD-10-CM | POA: Diagnosis not present

## 2019-11-29 DIAGNOSIS — Z79899 Other long term (current) drug therapy: Secondary | ICD-10-CM | POA: Diagnosis not present

## 2019-11-29 DIAGNOSIS — J449 Chronic obstructive pulmonary disease, unspecified: Secondary | ICD-10-CM | POA: Insufficient documentation

## 2019-11-29 DIAGNOSIS — Z91040 Latex allergy status: Secondary | ICD-10-CM | POA: Diagnosis not present

## 2019-11-29 DIAGNOSIS — I10 Essential (primary) hypertension: Secondary | ICD-10-CM | POA: Insufficient documentation

## 2019-11-29 DIAGNOSIS — E039 Hypothyroidism, unspecified: Secondary | ICD-10-CM | POA: Insufficient documentation

## 2019-11-29 DIAGNOSIS — Z85850 Personal history of malignant neoplasm of thyroid: Secondary | ICD-10-CM | POA: Insufficient documentation

## 2019-11-29 DIAGNOSIS — R2243 Localized swelling, mass and lump, lower limb, bilateral: Secondary | ICD-10-CM | POA: Insufficient documentation

## 2019-11-29 LAB — CBC WITH DIFFERENTIAL/PLATELET
Abs Immature Granulocytes: 0.05 10*3/uL (ref 0.00–0.07)
Basophils Absolute: 0 10*3/uL (ref 0.0–0.1)
Basophils Relative: 1 %
Eosinophils Absolute: 0.1 10*3/uL (ref 0.0–0.5)
Eosinophils Relative: 1 %
HCT: 36.5 % (ref 36.0–46.0)
Hemoglobin: 12 g/dL (ref 12.0–15.0)
Immature Granulocytes: 1 %
Lymphocytes Relative: 28 %
Lymphs Abs: 1.7 10*3/uL (ref 0.7–4.0)
MCH: 27.8 pg (ref 26.0–34.0)
MCHC: 32.9 g/dL (ref 30.0–36.0)
MCV: 84.5 fL (ref 80.0–100.0)
Monocytes Absolute: 0.6 10*3/uL (ref 0.1–1.0)
Monocytes Relative: 10 %
Neutro Abs: 3.7 10*3/uL (ref 1.7–7.7)
Neutrophils Relative %: 59 %
Platelets: 273 10*3/uL (ref 150–400)
RBC: 4.32 MIL/uL (ref 3.87–5.11)
RDW: 14.8 % (ref 11.5–15.5)
WBC: 6.2 10*3/uL (ref 4.0–10.5)
nRBC: 0.3 % — ABNORMAL HIGH (ref 0.0–0.2)

## 2019-11-29 LAB — BASIC METABOLIC PANEL
Anion gap: 10 (ref 5–15)
BUN: 10 mg/dL (ref 6–20)
CO2: 24 mmol/L (ref 22–32)
Calcium: 8.8 mg/dL — ABNORMAL LOW (ref 8.9–10.3)
Chloride: 104 mmol/L (ref 98–111)
Creatinine, Ser: 1.01 mg/dL — ABNORMAL HIGH (ref 0.44–1.00)
GFR calc Af Amer: >60 mL/min (ref >60)
GFR calc non Af Amer: >60 mL/min (ref >60)
Glucose, Bld: 92 mg/dL (ref 70–99)
Potassium: 3.6 mmol/L (ref 3.5–5.1)
Sodium: 138 mmol/L (ref 135–145)

## 2019-11-29 LAB — TSH: TSH: 1.313 u[IU]/mL (ref 0.350–4.500)

## 2019-11-29 NOTE — ED Notes (Signed)
No answer when called several times from lobby 
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
## 2019-11-29 NOTE — ED Triage Notes (Addendum)
Pt c/o of bilat leg swelling intermittently x a year. States swelling decreases when pt lays down. Denies CHF. Denies weight gain. States hx of thyroid issues. A&O, ambulatory. No distress noted. Is taking 20mg  lasix.

## 2020-01-24 ENCOUNTER — Other Ambulatory Visit: Payer: Self-pay | Admitting: Physician Assistant

## 2020-01-25 ENCOUNTER — Other Ambulatory Visit: Payer: Self-pay | Admitting: Physician Assistant

## 2020-01-27 ENCOUNTER — Other Ambulatory Visit: Payer: Self-pay | Admitting: Obstetrics & Gynecology

## 2020-01-30 ENCOUNTER — Other Ambulatory Visit: Payer: Self-pay | Admitting: Obstetrics & Gynecology

## 2020-01-30 MED ORDER — SLYND 4 MG PO TABS: 1.0000 | ORAL_TABLET | Freq: Every day | ORAL | 1 refills | Status: DC

## 2020-01-31 ENCOUNTER — Other Ambulatory Visit: Payer: Self-pay | Admitting: Orthopedic Surgery

## 2020-01-31 ENCOUNTER — Telehealth: Payer: Self-pay | Admitting: Obstetrics & Gynecology

## 2020-01-31 DIAGNOSIS — M7541 Impingement syndrome of right shoulder: Secondary | ICD-10-CM

## 2020-01-31 NOTE — Telephone Encounter (Signed)
Called and left voice mail for patient to call back to be schedule °

## 2020-01-31 NOTE — Telephone Encounter (Signed)
-----   Message from [name], MD sent at 01/30/2020  3:56 PM EST ----- Regarding: Sch Annual. One refill sent for Progesterone

## 2020-02-01 NOTE — Telephone Encounter (Signed)
Patient is schedule for 02/20/20 at 1:50 with [facility]

## 2020-02-06 ENCOUNTER — Telehealth: Payer: Self-pay

## 2020-02-06 NOTE — Telephone Encounter (Signed)
Pt calling; has appt on 3/15; calling to be sure.  [phone] [facility] - didn't understand her message - infusion, bleeding.

## 2020-02-08 NOTE — Telephone Encounter (Signed)
Pt aware appt for annual c [place] on 02/20/20; will get refills of bc when she comes in.

## 2020-02-10 ENCOUNTER — Ambulatory Visit
Admission: RE | Admit: 2020-02-10 | Discharge: 2020-02-10 | Disposition: A | Discharge status: Home / Self Care | Payer: Medicaid Other | Source: Ambulatory Visit | Attending: Orthopedic Surgery | Admitting: Orthopedic Surgery

## 2020-02-10 ENCOUNTER — Other Ambulatory Visit: Payer: Self-pay | Admitting: Orthopedic Surgery

## 2020-02-10 ENCOUNTER — Other Ambulatory Visit: Payer: Self-pay

## 2020-02-10 DIAGNOSIS — M7542 Impingement syndrome of left shoulder: Secondary | ICD-10-CM

## 2020-02-10 DIAGNOSIS — M7541 Impingement syndrome of right shoulder: Secondary | ICD-10-CM

## 2020-02-10 MED ORDER — IOHEXOL 180 MG/ML  SOLN: 15.0000 mL | Freq: Once | INTRAMUSCULAR | Status: DC | PRN

## 2020-02-10 MED ORDER — SODIUM CHLORIDE (PF) 0.9 % IJ SOLN: 5.0000 mL | Freq: Once | INTRAMUSCULAR | Status: DC

## 2020-02-10 MED ORDER — GADOBUTROL 1 MMOL/ML IV SOLN: 0.0500 mL | Freq: Once | INTRAVENOUS | Status: DC | PRN

## 2020-02-15 ENCOUNTER — Other Ambulatory Visit: Payer: Self-pay | Admitting: Orthopedic Surgery

## 2020-02-18 ENCOUNTER — Emergency Department
Admission: EM | Admit: 2020-02-18 | Discharge: 2020-02-18 | Disposition: A | Discharge status: Home / Self Care | Payer: Medicaid Other | Attending: Emergency Medicine | Admitting: Emergency Medicine

## 2020-02-18 ENCOUNTER — Other Ambulatory Visit: Payer: Self-pay

## 2020-02-18 DIAGNOSIS — F4310 Post-traumatic stress disorder, unspecified: Secondary | ICD-10-CM | POA: Insufficient documentation

## 2020-02-18 DIAGNOSIS — J449 Chronic obstructive pulmonary disease, unspecified: Secondary | ICD-10-CM | POA: Insufficient documentation

## 2020-02-18 DIAGNOSIS — F333 Major depressive disorder, recurrent, severe with psychotic symptoms: Secondary | ICD-10-CM | POA: Insufficient documentation

## 2020-02-18 DIAGNOSIS — Z79899 Other long term (current) drug therapy: Secondary | ICD-10-CM | POA: Diagnosis not present

## 2020-02-18 DIAGNOSIS — E039 Hypothyroidism, unspecified: Secondary | ICD-10-CM | POA: Diagnosis not present

## 2020-02-18 DIAGNOSIS — Z91040 Latex allergy status: Secondary | ICD-10-CM | POA: Insufficient documentation

## 2020-02-18 DIAGNOSIS — I10 Essential (primary) hypertension: Secondary | ICD-10-CM | POA: Diagnosis not present

## 2020-02-18 DIAGNOSIS — R440 Auditory hallucinations: Secondary | ICD-10-CM

## 2020-02-18 LAB — URINE DRUG SCREEN, QUALITATIVE (ARMC ONLY)
Amphetamines, Ur Screen: NOT DETECTED
Barbiturates, Ur Screen: NOT DETECTED
Benzodiazepine, Ur Scrn: NOT DETECTED
Cannabinoid 50 Ng, Ur ~~LOC~~: NOT DETECTED
Cocaine Metabolite,Ur ~~LOC~~: NOT DETECTED
MDMA (Ecstasy)Ur Screen: NOT DETECTED
Methadone Scn, Ur: NOT DETECTED
Opiate, Ur Screen: NOT DETECTED
Phencyclidine (PCP) Ur S: NOT DETECTED
Tricyclic, Ur Screen: POSITIVE — AB

## 2020-02-18 LAB — COMPREHENSIVE METABOLIC PANEL
ALT: 45 U/L — ABNORMAL HIGH (ref 0–44)
AST: 30 U/L (ref 15–41)
Albumin: 4.2 g/dL (ref 3.5–5.0)
Alkaline Phosphatase: 65 U/L (ref 38–126)
Anion gap: 6 (ref 5–15)
BUN: 10 mg/dL (ref 6–20)
CO2: 24 mmol/L (ref 22–32)
Calcium: 9.2 mg/dL (ref 8.9–10.3)
Chloride: 108 mmol/L (ref 98–111)
Creatinine, Ser: 0.94 mg/dL (ref 0.44–1.00)
GFR calc Af Amer: >60 mL/min (ref >60)
GFR calc non Af Amer: >60 mL/min (ref >60)
Glucose, Bld: 108 mg/dL — ABNORMAL HIGH (ref 70–99)
Potassium: 3.9 mmol/L (ref 3.5–5.1)
Sodium: 138 mmol/L (ref 135–145)
Total Bilirubin: 0.4 mg/dL (ref 0.3–1.2)
Total Protein: 7.5 g/dL (ref 6.5–8.1)

## 2020-02-18 LAB — CBC
HCT: 37.9 % (ref 36.0–46.0)
Hemoglobin: 12.3 g/dL (ref 12.0–15.0)
MCH: 27.5 pg (ref 26.0–34.0)
MCHC: 32.5 g/dL (ref 30.0–36.0)
MCV: 84.8 fL (ref 80.0–100.0)
Platelets: 232 10*3/uL (ref 150–400)
RBC: 4.47 MIL/uL (ref 3.87–5.11)
RDW: 15.1 % (ref 11.5–15.5)
WBC: 6.9 10*3/uL (ref 4.0–10.5)
nRBC: 0 % (ref 0.0–0.2)

## 2020-02-18 LAB — ACETAMINOPHEN LEVEL: Acetaminophen (Tylenol), Serum: <10 ug/mL — ABNORMAL LOW (ref 10–30)

## 2020-02-18 LAB — ETHANOL: Alcohol, Ethyl (B): <10 mg/dL (ref <10)

## 2020-02-18 LAB — SALICYLATE LEVEL: Salicylate Lvl: <7 mg/dL — ABNORMAL LOW (ref 7.0–30.0)

## 2020-02-18 LAB — TSH: TSH: 1.506 u[IU]/mL (ref 0.350–4.500)

## 2020-02-18 MED ORDER — METOPROLOL SUCCINATE ER 50 MG PO TB24
50.0000 mg | ORAL_TABLET | Freq: Every day | ORAL | Status: DC
  Administered 2020-02-18: 50 mg via ORAL
  Filled 2020-02-18: qty 1

## 2020-02-18 MED ORDER — LIOTHYRONINE SODIUM 5 MCG PO TABS
5.0000 ug | ORAL_TABLET | Freq: Every day | ORAL | Status: DC
  Administered 2020-02-18: 5 ug via ORAL
  Filled 2020-02-18: qty 1

## 2020-02-18 MED ORDER — OLANZAPINE 5 MG PO TABS: 5.0000 mg | ORAL_TABLET | Freq: Every day | ORAL | 1 refills | Status: DC

## 2020-02-18 MED ORDER — LIOTHYRONINE SODIUM 5 MCG PO TABS: 5.0000 ug | ORAL_TABLET | Freq: Every day | ORAL | Status: DC

## 2020-02-18 MED ORDER — OLANZAPINE 5 MG PO TABS: 5.0000 mg | ORAL_TABLET | Freq: Every day | ORAL | Status: DC

## 2020-02-18 MED ORDER — LEVOTHYROXINE SODIUM 88 MCG PO TABS
88.0000 ug | ORAL_TABLET | Freq: Every day | ORAL | Status: DC
  Administered 2020-02-18: 88 ug via ORAL
  Filled 2020-02-18: qty 1

## 2020-02-18 MED ORDER — AMLODIPINE BESYLATE 5 MG PO TABS
5.0000 mg | ORAL_TABLET | Freq: Every day | ORAL | Status: DC
  Administered 2020-02-18: 5 mg via ORAL
  Filled 2020-02-18: qty 1

## 2020-02-18 MED ORDER — TIZANIDINE HCL 2 MG PO TABS: 2.0000 mg | ORAL_TABLET | Freq: Three times a day (TID) | ORAL | Status: DC | PRN

## 2020-02-18 NOTE — ED Provider Notes (Signed)
Patient cleared by the psychiatric team and discharged on Zyprexa    Vanessa Aberdeen, MD 02/18/20 1207
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
## 2020-02-18 NOTE — Discharge Instructions (Signed)
Cleared by the psychiatric team and started on new medication.  Return to the ER for any other concerns

## 2020-02-18 NOTE — ED Triage Notes (Signed)
Patient report she has been hearing voices  calling her names and feeling anxious.

## 2020-02-18 NOTE — ED Notes (Signed)
Pt up to restroom   Food tray left at bedside

## 2020-02-18 NOTE — ED Notes (Signed)
Pt given juice and graham crackers.

## 2020-02-18 NOTE — ED Notes (Signed)
Black colored tennis shoes, black colored sock, black colored pants, pink colored shirt, white colored sweater, leopard print face mask, silver colored earrings, black colored panties with white colored polka dots, black and burgandy colored bra  -- placed in labeled belongings bag to be secured on the unit.
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
## 2020-02-18 NOTE — ED Notes (Signed)
This EDT provided pt with a cup of apple juice per patient request
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
## 2020-02-20 ENCOUNTER — Ambulatory Visit: Payer: Medicaid Other | Admitting: Obstetrics & Gynecology

## 2020-03-06 ENCOUNTER — Inpatient Hospital Stay: Admission: RE | Admit: 2020-03-06 | Payer: Medicaid Other | Source: Ambulatory Visit

## 2020-03-07 ENCOUNTER — Other Ambulatory Visit: Payer: Self-pay

## 2020-03-07 ENCOUNTER — Encounter
Admission: RE | Admit: 2020-03-07 | Discharge: 2020-03-07 | Disposition: A | Discharge status: Home / Self Care | Payer: Medicaid Other | Source: Ambulatory Visit | Attending: Orthopedic Surgery | Admitting: Orthopedic Surgery

## 2020-03-07 DIAGNOSIS — Z01818 Encounter for other preprocedural examination: Secondary | ICD-10-CM | POA: Insufficient documentation

## 2020-03-07 HISTORY — DX: Dyspnea, unspecified: R06.00

## 2020-03-07 HISTORY — DX: Personal history of Methicillin resistant Staphylococcus aureus infection: Z86.14

## 2020-03-07 NOTE — Patient Instructions (Addendum)
Your procedure is scheduled on: 03-15-20 THURSDAY Report to Same Day Surgery 2nd floor medical mall [facility] Entrance-take elevator on left to 2nd floor.  Check in with surgery information desk.) To find out your arrival time please call [phone] between 1PM - 3PM on 03-14-20 [facility]  Remember: Instructions that are not followed completely may result in serious medical risk, up to and including death, or upon the discretion of your surgeon and anesthesiologist your surgery may need to be rescheduled.    _x___ 1. Do not eat food after midnight the night before your procedure. NO GUM OR CANDY AFTER MIDNIGHT. You may drink clear liquids up to 2 hours before you are scheduled to arrive at the hospital for your procedure.  Do not drink clear liquids within 2 hours of your scheduled arrival to the hospital.  Clear liquids include  --Water or Apple juice without pulp  --Gatorade  --Black Coffee or Clear Tea (No milk, no creamers, do not add anything to the coffee or Tea    ____Ensure clear carbohydrate drink on the way to the hospital for bariatric patients  ____Ensure clear carbohydrate drink 3 hours before surgery.    __x__ 2. No Alcohol for 24 hours before or after surgery.   __x__3. No Smoking or e-cigarettes for 24 prior to surgery.  Do not use any chewable tobacco products for at least 6 hour prior to surgery   ____  4. Bring all medications with you on the day of surgery if instructed.    __x__ 5. Notify your doctor if there is any change in your medical condition     (cold, fever, infections).    x___6. On the morning of surgery brush your teeth with toothpaste and water.  You may rinse your mouth with mouth wash if you wish.  Do not swallow any toothpaste or mouthwash.   Do not wear jewelry, make-up, hairpins, clips or nail polish.  Do not wear lotions, powders, or perfumes.   Do not shave 48 hours prior to surgery. Men may shave face and neck.  Do not bring valuables to  the hospital.    [facility] is not responsible for any belongings or valuables.               Contacts, dentures or bridgework may not be worn into surgery.  Leave your suitcase in the car. After surgery it may be brought to your room.  For patients admitted to the hospital, discharge time is determined by your  treatment team.  _  Patients discharged the day of surgery will not be allowed to drive home.  You will need someone to drive you home and stay with you the night of your procedure.    Please read over the following fact sheets that you were given:   [facility] Preparing for Surgery   _x___ TAKE THE FOLLOWING MEDICATION THE MORNING OF SURGERY WITH A SMALL SIP OF WATER. These include:  1. SYNTHROID (LEVOTHYROXINE)  2. CYTOMEL (LIOTHYRONINE)  3. METOPROLOL (TOPROL)  4. NUCYNTA (TAPENTADOL)  5. PROTONIX (PANTOPRAZOLE)  6. TAKE AN EXTRA PROTONIX THE NIGHT BEFORE YOUR SURGERY  ____Fleets enema or Magnesium Citrate as directed.   _x___ Use CHG Soap or sage wipes as directed on instruction sheet   _X___ Use inhalers on the day of surgery and bring to hospital day of [place]  ____ Stop Metformin and Janumet 2 days prior to surgery.  ____ Take 1/2 of usual insulin dose the night before surgery and none on the morning surgery.   ____ Follow recommendations from Cardiologist, Pulmonologist or PCP regarding stopping Aspirin, Coumadin, Plavix ,Eliquis, Effient, or Pradaxa, and Pletal.  X____Stop Anti-inflammatories such as Advil, Aleve, Ibuprofen, Motrin, Naproxen, Naprosyn, Goodies powders or aspirin products NOW-OK to take Tylenol OR TRAMADOL IF NEEDED   _x___ Stop supplements until after surgery-STOP GARLIC NOW-MAY RESUME AFTER SURGERY   ____ Bring C-Pap to the hospital.

## 2020-03-09 ENCOUNTER — Inpatient Hospital Stay: Admission: RE | Admit: 2020-03-09 | Payer: Medicaid Other | Source: Ambulatory Visit

## 2020-03-12 ENCOUNTER — Encounter
Admission: RE | Admit: 2020-03-12 | Discharge: 2020-03-12 | Disposition: A | Discharge status: Home / Self Care | Payer: Medicaid Other | Source: Ambulatory Visit | Attending: Orthopedic Surgery | Admitting: Orthopedic Surgery

## 2020-03-12 ENCOUNTER — Other Ambulatory Visit
Admission: RE | Admit: 2020-03-12 | Discharge: 2020-03-12 | Disposition: A | Discharge status: Home / Self Care | Payer: Medicaid Other | Source: Ambulatory Visit | Attending: Orthopedic Surgery | Admitting: Orthopedic Surgery

## 2020-03-12 ENCOUNTER — Other Ambulatory Visit: Payer: Self-pay

## 2020-03-12 DIAGNOSIS — Z20822 Contact with and (suspected) exposure to covid-19: Secondary | ICD-10-CM | POA: Insufficient documentation

## 2020-03-12 DIAGNOSIS — R011 Cardiac murmur, unspecified: Secondary | ICD-10-CM | POA: Diagnosis not present

## 2020-03-12 DIAGNOSIS — Z01818 Encounter for other preprocedural examination: Secondary | ICD-10-CM | POA: Insufficient documentation

## 2020-03-12 DIAGNOSIS — R000 Tachycardia, unspecified: Secondary | ICD-10-CM | POA: Insufficient documentation

## 2020-03-12 DIAGNOSIS — I10 Essential (primary) hypertension: Secondary | ICD-10-CM | POA: Diagnosis not present

## 2020-03-12 LAB — SARS CORONAVIRUS 2 (TAT 6-24 HRS): SARS Coronavirus 2: NEGATIVE

## 2020-03-12 LAB — PROTIME-INR
INR: 0.9 (ref 0.8–1.2)
Prothrombin Time: 12.1 seconds (ref 11.4–15.2)

## 2020-03-12 LAB — APTT: aPTT: 29 seconds (ref 24–36)

## 2020-03-13 ENCOUNTER — Other Ambulatory Visit: Payer: Medicaid Other

## 2020-03-15 ENCOUNTER — Encounter
Admission: RE | Disposition: A | Discharge status: Home / Self Care | Payer: Self-pay | Source: Home / Self Care | Attending: Orthopedic Surgery

## 2020-03-15 ENCOUNTER — Ambulatory Visit: Payer: Medicaid Other | Admitting: Anesthesiology

## 2020-03-15 ENCOUNTER — Ambulatory Visit
Admission: RE | Admit: 2020-03-15 | Discharge: 2020-03-15 | Disposition: A | Discharge status: Home / Self Care | Payer: Medicaid Other | Attending: Orthopedic Surgery | Admitting: Orthopedic Surgery

## 2020-03-15 ENCOUNTER — Encounter: Payer: Self-pay | Admitting: Orthopedic Surgery

## 2020-03-15 ENCOUNTER — Other Ambulatory Visit: Payer: Self-pay

## 2020-03-15 ENCOUNTER — Ambulatory Visit: Payer: Medicaid Other

## 2020-03-15 DIAGNOSIS — F329 Major depressive disorder, single episode, unspecified: Secondary | ICD-10-CM | POA: Insufficient documentation

## 2020-03-15 DIAGNOSIS — M7552 Bursitis of left shoulder: Secondary | ICD-10-CM | POA: Diagnosis not present

## 2020-03-15 DIAGNOSIS — Z888 Allergy status to other drugs, medicaments and biological substances status: Secondary | ICD-10-CM | POA: Insufficient documentation

## 2020-03-15 DIAGNOSIS — M797 Fibromyalgia: Secondary | ICD-10-CM | POA: Diagnosis not present

## 2020-03-15 DIAGNOSIS — I341 Nonrheumatic mitral (valve) prolapse: Secondary | ICD-10-CM | POA: Diagnosis not present

## 2020-03-15 DIAGNOSIS — J449 Chronic obstructive pulmonary disease, unspecified: Secondary | ICD-10-CM | POA: Diagnosis not present

## 2020-03-15 DIAGNOSIS — E890 Postprocedural hypothyroidism: Secondary | ICD-10-CM | POA: Insufficient documentation

## 2020-03-15 DIAGNOSIS — Z79890 Hormone replacement therapy (postmenopausal): Secondary | ICD-10-CM | POA: Insufficient documentation

## 2020-03-15 DIAGNOSIS — M5481 Occipital neuralgia: Secondary | ICD-10-CM | POA: Diagnosis not present

## 2020-03-15 DIAGNOSIS — Z87891 Personal history of nicotine dependence: Secondary | ICD-10-CM | POA: Diagnosis not present

## 2020-03-15 DIAGNOSIS — F419 Anxiety disorder, unspecified: Secondary | ICD-10-CM | POA: Insufficient documentation

## 2020-03-15 DIAGNOSIS — Z419 Encounter for procedure for purposes other than remedying health state, unspecified: Secondary | ICD-10-CM

## 2020-03-15 DIAGNOSIS — K219 Gastro-esophageal reflux disease without esophagitis: Secondary | ICD-10-CM | POA: Diagnosis not present

## 2020-03-15 DIAGNOSIS — M7542 Impingement syndrome of left shoulder: Secondary | ICD-10-CM | POA: Insufficient documentation

## 2020-03-15 DIAGNOSIS — I10 Essential (primary) hypertension: Secondary | ICD-10-CM | POA: Diagnosis not present

## 2020-03-15 DIAGNOSIS — Z91040 Latex allergy status: Secondary | ICD-10-CM | POA: Diagnosis not present

## 2020-03-15 DIAGNOSIS — G43909 Migraine, unspecified, not intractable, without status migrainosus: Secondary | ICD-10-CM | POA: Diagnosis not present

## 2020-03-15 DIAGNOSIS — Z85850 Personal history of malignant neoplasm of thyroid: Secondary | ICD-10-CM | POA: Diagnosis not present

## 2020-03-15 DIAGNOSIS — Z79891 Long term (current) use of opiate analgesic: Secondary | ICD-10-CM | POA: Diagnosis not present

## 2020-03-15 DIAGNOSIS — G894 Chronic pain syndrome: Secondary | ICD-10-CM | POA: Diagnosis not present

## 2020-03-15 DIAGNOSIS — M1990 Unspecified osteoarthritis, unspecified site: Secondary | ICD-10-CM | POA: Diagnosis not present

## 2020-03-15 HISTORY — PX: SHOULDER ARTHROSCOPY WITH SUBACROMIAL DECOMPRESSION: SHX5684

## 2020-03-15 LAB — POCT PREGNANCY, URINE: Preg Test, Ur: NEGATIVE

## 2020-03-15 SURGERY — SHOULDER ARTHROSCOPY WITH SUBACROMIAL DECOMPRESSION
Anesthesia: General | Site: Shoulder | Laterality: Left

## 2020-03-15 MED ORDER — GLYCOPYRROLATE 0.2 MG/ML IJ SOLN
INTRAMUSCULAR | Status: DC | PRN
  Administered 2020-03-15: .2 mg via INTRAVENOUS

## 2020-03-15 MED ORDER — ACETAMINOPHEN 10 MG/ML IV SOLN
INTRAVENOUS | Status: DC | PRN
  Administered 2020-03-15: 1000 mg via INTRAVENOUS

## 2020-03-15 MED ORDER — MIDAZOLAM HCL 2 MG/2ML IJ SOLN
INTRAMUSCULAR | Status: DC | PRN
  Administered 2020-03-15: 2 mg via INTRAVENOUS

## 2020-03-15 MED ORDER — CHLORHEXIDINE GLUCONATE CLOTH 2 % EX PADS: 6.0000 | MEDICATED_PAD | Freq: Once | CUTANEOUS | Status: DC

## 2020-03-15 MED ORDER — ONDANSETRON HCL 4 MG/2ML IJ SOLN
INTRAMUSCULAR | Status: AC
  Filled 2020-03-15: qty 2

## 2020-03-15 MED ORDER — ONDANSETRON HCL 4 MG/2ML IJ SOLN
INTRAMUSCULAR | Status: DC | PRN
  Administered 2020-03-15 (×2): 4 mg via INTRAVENOUS

## 2020-03-15 MED ORDER — FENTANYL CITRATE (PF) 100 MCG/2ML IJ SOLN
INTRAMUSCULAR | Status: AC
  Filled 2020-03-15: qty 2

## 2020-03-15 MED ORDER — PROPOFOL 10 MG/ML IV BOLUS
INTRAVENOUS | Status: DC | PRN
  Administered 2020-03-15: 80 mg via INTRAVENOUS

## 2020-03-15 MED ORDER — EPINEPHRINE PF 1 MG/ML IJ SOLN
INTRAMUSCULAR | Status: DC | PRN
  Administered 2020-03-15: 4 mL

## 2020-03-15 MED ORDER — ROCURONIUM BROMIDE 100 MG/10ML IV SOLN
INTRAVENOUS | Status: DC | PRN
  Administered 2020-03-15: 50 mg via INTRAVENOUS

## 2020-03-15 MED ORDER — LACTATED RINGERS IV SOLN: INTRAVENOUS | Status: DC

## 2020-03-15 MED ORDER — DEXMEDETOMIDINE HCL IN NACL 200 MCG/50ML IV SOLN
INTRAVENOUS | Status: DC | PRN
  Administered 2020-03-15: 8 ug via INTRAVENOUS

## 2020-03-15 MED ORDER — HYDROCODONE-ACETAMINOPHEN 5-325 MG PO TABS: 1.0000 | ORAL_TABLET | ORAL | 0 refills | Status: DC | PRN

## 2020-03-15 MED ORDER — FENTANYL CITRATE (PF) 100 MCG/2ML IJ SOLN
INTRAMUSCULAR | Status: DC | PRN
  Administered 2020-03-15: 50 ug via INTRAVENOUS

## 2020-03-15 MED ORDER — BUPIVACAINE HCL 0.25 % IJ SOLN
INTRAMUSCULAR | Status: DC | PRN
  Administered 2020-03-15: 30 mL

## 2020-03-15 MED ORDER — HYDROMORPHONE HCL 1 MG/ML IJ SOLN
INTRAMUSCULAR | Status: AC
  Filled 2020-03-15: qty 1

## 2020-03-15 MED ORDER — PROPOFOL 10 MG/ML IV BOLUS
INTRAVENOUS | Status: AC
  Filled 2020-03-15: qty 20

## 2020-03-15 MED ORDER — NEOMYCIN-POLYMYXIN B GU 40-200000 IR SOLN
Status: DC | PRN
  Administered 2020-03-15: 2 mL

## 2020-03-15 MED ORDER — CEFAZOLIN SODIUM-DEXTROSE 2-4 GM/100ML-% IV SOLN
2.0000 g | INTRAVENOUS | Status: AC
  Administered 2020-03-15: 2 g via INTRAVENOUS

## 2020-03-15 MED ORDER — DEXAMETHASONE SODIUM PHOSPHATE 10 MG/ML IJ SOLN
INTRAMUSCULAR | Status: DC | PRN
  Administered 2020-03-15: 10 mg via INTRAVENOUS

## 2020-03-15 MED ORDER — DEXAMETHASONE SODIUM PHOSPHATE 10 MG/ML IJ SOLN
INTRAMUSCULAR | Status: AC
  Filled 2020-03-15: qty 1

## 2020-03-15 MED ORDER — LIDOCAINE HCL (CARDIAC) PF 100 MG/5ML IV SOSY
PREFILLED_SYRINGE | INTRAVENOUS | Status: DC | PRN
  Administered 2020-03-15: 100 mg via INTRAVENOUS

## 2020-03-15 MED ORDER — LIDOCAINE HCL (PF) 1 % IJ SOLN: INTRAMUSCULAR | Status: DC | PRN

## 2020-03-15 MED ORDER — HYDROCODONE-ACETAMINOPHEN 5-325 MG PO TABS: 1.0000 | ORAL_TABLET | Freq: Once | ORAL | Status: AC

## 2020-03-15 MED ORDER — LACTATED RINGERS IV SOLN: INTRAVENOUS | Status: DC | PRN

## 2020-03-15 MED ORDER — ACETAMINOPHEN 10 MG/ML IV SOLN
INTRAVENOUS | Status: AC
  Filled 2020-03-15: qty 100

## 2020-03-15 MED ORDER — ROCURONIUM BROMIDE 10 MG/ML (PF) SYRINGE
PREFILLED_SYRINGE | INTRAVENOUS | Status: AC
  Filled 2020-03-15: qty 10

## 2020-03-15 MED ORDER — MIDAZOLAM HCL 2 MG/2ML IJ SOLN
INTRAMUSCULAR | Status: AC
  Filled 2020-03-15: qty 2

## 2020-03-15 MED ORDER — HYDROCODONE-ACETAMINOPHEN 5-325 MG PO TABS
ORAL_TABLET | ORAL | Status: AC
  Administered 2020-03-15: 1 via ORAL
  Filled 2020-03-15: qty 1

## 2020-03-15 MED ORDER — PROPOFOL 10 MG/ML IV BOLUS
INTRAVENOUS | Status: AC
  Filled 2020-03-15: qty 40

## 2020-03-15 MED ORDER — SUGAMMADEX SODIUM 200 MG/2ML IV SOLN
INTRAVENOUS | Status: DC | PRN
  Administered 2020-03-15: 300 mg via INTRAVENOUS

## 2020-03-15 MED ORDER — KETOROLAC TROMETHAMINE 30 MG/ML IJ SOLN
INTRAMUSCULAR | Status: DC | PRN
  Administered 2020-03-15: 30 mg via INTRAVENOUS

## 2020-03-15 MED ORDER — HYDROMORPHONE HCL 1 MG/ML IJ SOLN
INTRAMUSCULAR | Status: DC | PRN
  Administered 2020-03-15: .5 mg via INTRAVENOUS

## 2020-03-15 MED ORDER — ONDANSETRON HCL 4 MG PO TABS: 4.0000 mg | ORAL_TABLET | Freq: Three times a day (TID) | ORAL | 0 refills | Status: DC | PRN

## 2020-03-15 MED ORDER — CEFAZOLIN SODIUM-DEXTROSE 2-4 GM/100ML-% IV SOLN
INTRAVENOUS | Status: AC
  Filled 2020-03-15: qty 100

## 2020-03-15 SURGICAL SUPPLY — 72 items
ADAPTER IRRIG TUBE 2 SPIKE SOL (ADAPTER) ×6 IMPLANT
ADPR TBG 2 SPK PMP STRL ASCP (ADAPTER) ×2
BUR RADIUS 4.0X18.5 (BURR) ×3 IMPLANT
BUR RADIUS 5.5 (BURR) ×3 IMPLANT
CANISTER SUCT LVC 12 LTR MEDI- (MISCELLANEOUS) ×3 IMPLANT
CANNULA 5.75X7 CRYSTAL CLEAR (CANNULA) ×4 IMPLANT
CANNULA PARTIAL THREAD 2X7 (CANNULA) ×3 IMPLANT
CANNULA TWIST IN 8.25X9CM (CANNULA) ×6 IMPLANT
CLOSURE WOUND 1/2 X4 (GAUZE/BANDAGES/DRESSINGS) ×1
CONNECTOR PERFECT PASSER (CONNECTOR) ×6 IMPLANT
COOLER POLAR GLACIER W/PUMP (MISCELLANEOUS) ×3 IMPLANT
COVER WAND RF STERILE (DRAPES) ×3 IMPLANT
CRADLE LAMINECT ARM (MISCELLANEOUS) ×6 IMPLANT
DEVICE SUCT BLK HOLE OR FLOOR (MISCELLANEOUS) ×6 IMPLANT
DRAPE 3/4 80X56 (DRAPES) ×3 IMPLANT
DRAPE IMP U-DRAPE 54X76 (DRAPES) ×6 IMPLANT
DRAPE INCISE IOBAN 66X45 STRL (DRAPES) ×3 IMPLANT
DRAPE U-SHAPE 47X51 STRL (DRAPES) ×3 IMPLANT
DRSG OPSITE POSTOP 3X4 (GAUZE/BANDAGES/DRESSINGS) ×6 IMPLANT
DURAPREP 26ML APPLICATOR (WOUND CARE) ×9 IMPLANT
ELECT REM PT RETURN 9FT ADLT (ELECTROSURGICAL) ×3
ELECTRODE REM PT RTRN 9FT ADLT (ELECTROSURGICAL) ×1 IMPLANT
GAUZE SPONGE 4X4 12PLY STRL (GAUZE/BANDAGES/DRESSINGS) ×3 IMPLANT
GAUZE XEROFORM 1X8 LF (GAUZE/BANDAGES/DRESSINGS) ×3 IMPLANT
GLOVE BIOGEL PI IND STRL 9 (GLOVE) ×1 IMPLANT
GLOVE BIOGEL PI INDICATOR 9 (GLOVE) ×2
GLOVE SURG 9.0 ORTHO LTXF (GLOVE) ×9 IMPLANT
GOWN STRL REUS TWL 2XL XL LVL4 (GOWN DISPOSABLE) ×3 IMPLANT
GOWN STRL REUS W/ TWL LRG LVL3 (GOWN DISPOSABLE) ×1 IMPLANT
GOWN STRL REUS W/TWL LRG LVL3 (GOWN DISPOSABLE) ×3
IV LACTATED RINGER IRRG 3000ML (IV SOLUTION) ×24
IV LR IRRIG 3000ML ARTHROMATIC (IV SOLUTION) ×8 IMPLANT
KIT STABILIZATION SHOULDER (MISCELLANEOUS) ×3 IMPLANT
KIT SUTURE 2.8 Q-FIX DISP (MISCELLANEOUS) ×3 IMPLANT
KIT SUTURETAK 3.0 INSERT PERC (KITS) IMPLANT
KIT TURNOVER KIT A (KITS) ×3 IMPLANT
MANIFOLD NEPTUNE II (INSTRUMENTS) ×3 IMPLANT
MASK FACE SPIDER DISP (MASK) ×3 IMPLANT
MAT ABSORB  FLUID 56X50 GRAY (MISCELLANEOUS) ×9
MAT ABSORB FLUID 56X50 GRAY (MISCELLANEOUS) ×3 IMPLANT
NDL SAFETY ECLIPSE 18X1.5 (NEEDLE) ×1 IMPLANT
NEEDLE HYPO 18GX1.5 SHARP (NEEDLE) ×3
NEEDLE HYPO 22GX1.5 SAFETY (NEEDLE) ×3 IMPLANT
NS IRRIG 500ML POUR BTL (IV SOLUTION) ×3 IMPLANT
PACK ARTHROSCOPY SHOULDER (MISCELLANEOUS) ×3 IMPLANT
PAD ABD DERMACEA PRESS 5X9 (GAUZE/BANDAGES/DRESSINGS) ×3 IMPLANT
PAD WRAPON POLAR SHDR XLG (MISCELLANEOUS) ×1 IMPLANT
PASSER SUT CAPTURE FIRST (SUTURE) ×3 IMPLANT
SET TUBE SUCT SHAVER OUTFL 24K (TUBING) ×3 IMPLANT
SET TUBE TIP INTRA-ARTICULAR (MISCELLANEOUS) ×3 IMPLANT
SLING ARM M TX990204 (SOFTGOODS) ×2 IMPLANT
STRIP CLOSURE SKIN 1/2X4 (GAUZE/BANDAGES/DRESSINGS) ×2 IMPLANT
SUT ETHILON 4-0 (SUTURE) ×3
SUT ETHILON 4-0 FS2 18XMFL BLK (SUTURE) ×1
SUT LASSO 90 DEG SD STR (SUTURE) IMPLANT
SUT MNCRL 4-0 (SUTURE) ×3
SUT MNCRL 4-0 27XMFL (SUTURE) ×1
SUT PDS AB 0 CT1 27 (SUTURE) ×9 IMPLANT
SUT PERFECTPASSER WHITE CART (SUTURE) ×12 IMPLANT
SUT SMART STITCH CARTRIDGE (SUTURE) ×12 IMPLANT
SUT VIC AB 0 CT1 36 (SUTURE) ×9 IMPLANT
SUT VIC AB 2-0 CT2 27 (SUTURE) ×3 IMPLANT
SUTURE ETHLN 4-0 FS2 18XMF BLK (SUTURE) ×1 IMPLANT
SUTURE MAGNUM WIRE 2X48 BLK (SUTURE) ×2 IMPLANT
SUTURE MNCRL 4-0 27XMF (SUTURE) ×1 IMPLANT
SYR 10ML LL (SYRINGE) ×3 IMPLANT
TAPE MICROFOAM 4IN (TAPE) ×3 IMPLANT
TUBING ARTHRO INFLOW-ONLY STRL (TUBING) ×3 IMPLANT
TUBING CONNECTING 10 (TUBING) ×2 IMPLANT
TUBING CONNECTING 10' (TUBING) ×1
WAND HAND CNTRL MULTIVAC 90 (MISCELLANEOUS) ×5 IMPLANT
WRAPON POLAR PAD SHDR XLG (MISCELLANEOUS) ×3

## 2020-03-15 NOTE — Discharge Instructions (Signed)
AMBULATORY SURGERY  °DISCHARGE INSTRUCTIONS ° ° °1) The drugs that you were given will stay in your system until tomorrow so for the next 24 hours you should not: ° °A) Drive an automobile °B) Make any legal decisions °C) Drink any alcoholic beverage ° ° °2) You may resume regular meals tomorrow.  Today it is better to start with liquids and gradually work up to solid foods. ° °You may eat anything you prefer, but it is better to start with liquids, then soup and crackers, and gradually work up to solid foods. ° ° °3) Please notify your doctor immediately if you have any unusual bleeding, trouble breathing, redness and pain at the surgery site, drainage, fever, or pain not relieved by medication. ° ° ° °4) Additional Instructions: ° ° ° ° ° ° ° °Please contact your physician with any problems or Same Day Surgery at [phone], Monday through Friday 6 am to 4 pm, or [place] [facility] [place] Main number at [phone]. °

## 2020-03-15 NOTE — Anesthesia Procedure Notes (Signed)
Procedure Name: Intubation Performed by: Fletcher-Harrison, Yazmine Sorey, CRNA Pre-anesthesia Checklist: Patient identified, Emergency Drugs available, Suction available and Patient being monitored Patient Re-evaluated:Patient Re-evaluated prior to induction Oxygen Delivery Method: Circle system utilized Preoxygenation: Pre-oxygenation with 100% oxygen Induction Type: IV induction Ventilation: Mask ventilation without difficulty Laryngoscope Size: McGraph and 3 Grade View: Grade I Tube type: Oral Tube size: 7.0 mm Number of attempts: 1 Airway Equipment and Method: Stylet Placement Confirmation: ETT inserted through vocal cords under direct vision,  positive ETCO2,  CO2 detector and breath sounds checked- equal and bilateral Secured at: 21 cm Tube secured with: Tape Dental Injury: Teeth and Oropharynx as per pre-operative assessment        

## 2020-03-15 NOTE — Op Note (Signed)
03/15/2020  3:32 PM  PATIENT:  Kristina Li    PRE-OPERATIVE DIAGNOSIS:  Impingement Syndrome of the Left Shoulder  POST-OPERATIVE DIAGNOSIS:  Same  PROCEDURE:  LEFT SUBACROMIAL DECOMPRESSION AND DEBRIDEMENT WITH SUBACROMIAL DECOMPRESSION AND DEBRIDEMENT  SURGEON:  Thornton Park, MD  ANESTHESIA:   General  PREOPERATIVE INDICATIONS:  Kristina Li is a  39 y.o. female with a diagnosis of Impingement Syndrome of the Left Shoulder who failed conservative measures and elected for surgical management.    I discussed the risks and benefits of surgery. The risks include but are not limited to infection, bleeding requiring blood transfusion, nerve or blood vessel injury, joint stiffness or loss of motion, persistent pain, weakness or instability, malunion, nonunion and hardware failure and the need for further surgery. Medical risks include but are not limited to DVT and pulmonary embolism, myocardial infarction, stroke, pneumonia, respiratory failure and death. Patient understood these risks and wished to proceed.   OPERATIVE FINDINGS: Subacromial bursitis and impingement  OPERATIVE PROCEDURE: Patient was met in the preoperative area.  A preop H&P was performed at the bedside.  The left shoulder was marked with the word yes and my initials according to correct site of surgery protocol.  Patient has elected to proceed with a left shoulder arthroscopic evaluation and debridement and subacromial decompression for subacromial impingement.  Patient has failed nonoperative management.  Patient was brought to the operating room.  She was placed supine on the operative table.  She underwent general endotracheal intubation.  Patient was not given an scalene block.  The patient was positioned in a beachchair position.  All bony prominences were adequately padded.  A timeout was performed to verify the patient's name, date of birth, medical record number, correct site of surgery and correct  procedure to be performed.  Is also used confirm the patient received antibiotics and all appropriate instruments were available in the room.  Once all in attendance were in agreement the case began.  Examination under anesthesia revealed full passive range of motion without instability to load-and-shift testing.  Patient had a negative sulcus sign.  Bony landmarks were drawn out with a surgical marker along with proposed arthroscopy incisions.  These were preinjected with 1% lidocaine plain.  An 11 blade was used to establish a posterior portal.  An 18-gauge spinal needle was then used to establish an anterior portal under direct visualization.  A 5.75 mm arthroscopic cannula was placed to the anterior portal.  A full diagnostic exam of the left glenohumeral joint was performed.  There is no evidence of rotator cuff tear, labral tear or chondral lesions.  Patient had no significant synovitis.  The biceps tendon was intact along with the subscapularis.  There is no evidence of instability.  There is no loose bodies in the inferior recess and no evidence of a HAGL lesion.  The arthroscope was then placed in the subacromial space.  A lateral portal was placed using an 18-gauge spinal needle for localization.  Patient had subacromial bursitis.  This was resected using a 4.0 mm resector shaver blade and 90 degree ArthroCare wand.  The wand was used to debride the undersurface of the acromion.  Patient had anterior spurring and a subacromial decompression was performed with a 5.5 mm resector shaver blade.  Patient had no evidence of rotator cuff tear from the bursal side.  Patient did have a large adhesion extending from the posterolateral aspect of the humeral head extending to the anterior lateral deltoid fascia which appeared  to be causing an impingement mechanism on the shoulder with abduction.  This was debrided with a 90 degree ArthroCare wand and 4.0 mm resector shaver blade.  The rotator cuff was probed  from the bursal surface and no tear or defect was encountered.  The subacromial space was copiously irrigated and all arthroscopic instruments were removed.  The 3 arthroscopic portal incisions were closed with a 4-0 nylon.  Patient's incision sites in the subacromial space was injected with quarter percent Marcaine plain x30 cc.  Steri-Strips were applied along with Xeroform and honeycomb dressings.  Patient was then placed in a sling for her left shoulder.  She was then awoken and brought to the PACU in stable condition.  I was scrubbed and present for the entire case.  All sharp, sponge and instrument counts were correct at the conclusion the case.  I spoke with the patient's mother by phone from the PACU to let her know the case had been performed without complication and the patient was stable in the recovery room.
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
## 2020-03-15 NOTE — Anesthesia Postprocedure Evaluation (Signed)
Anesthesia Post Note  Patient: Kristina Li  Procedure(s) Performed: SHOULDER ARTHROSCOPY WITH SUBACROMIAL DECOMPRESSION and debridement (Left Shoulder)  Patient location during evaluation: PACU Anesthesia Type: General Level of consciousness: awake and alert Pain management: pain level controlled Vital Signs Assessment: post-procedure vital signs reviewed and stable Respiratory status: spontaneous breathing, nonlabored ventilation, respiratory function stable and patient connected to nasal cannula oxygen Cardiovascular status: blood pressure returned to baseline and stable Postop Assessment: no apparent nausea or vomiting Anesthetic complications: no     Last Vitals:  Vitals:   03/15/20 1614 03/15/20 1640  BP: 112/79 131/77  Pulse: (!) 104 (!) 102  Resp: 16 16  Temp: (!) 36.3 C (!) 36.4 C  SpO2: 96% 97%    Last Pain:  Vitals:   03/15/20 1640  TempSrc: Temporal  PainSc: 0-No pain                 Precious Haws Angelo Caroll
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
## 2020-03-15 NOTE — Transfer of Care (Signed)
Immediate Anesthesia Transfer of Care Note  Patient: Kristina Li  Procedure(s) Performed: SHOULDER ARTHROSCOPY WITH SUBACROMIAL DECOMPRESSION and debridement (Left Shoulder)  Patient Location: PACU  Anesthesia Type:General  Level of Consciousness: awake, drowsy and patient cooperative  Airway & Oxygen Therapy: Patient Spontanous Breathing and Patient connected to face mask oxygen  Post-op Assessment: Report given to RN and Post -op Vital signs reviewed and stable  Post vital signs: Reviewed and stable  Last Vitals:  Vitals Value Taken Time  BP 115/60 03/15/20 1519  Temp 36.2 C 03/15/20 1519  Pulse 102 03/15/20 1521  Resp 16 03/15/20 1521  SpO2 100 % 03/15/20 1521  Vitals shown include unvalidated device data.  Last Pain:  Vitals:   03/15/20 1022  TempSrc: Tympanic  PainSc: 5          Complications: No apparent anesthesia complications

## 2020-03-21 ENCOUNTER — Other Ambulatory Visit: Payer: Self-pay | Admitting: Obstetrics & Gynecology

## 2020-04-18 ENCOUNTER — Other Ambulatory Visit: Payer: Self-pay | Admitting: Obstetrics & Gynecology

## 2020-04-18 ENCOUNTER — Other Ambulatory Visit: Payer: Self-pay

## 2020-04-18 MED ORDER — SLYND 4 MG PO TABS: 1.0000 | ORAL_TABLET | Freq: Every day | ORAL | 1 refills | Status: DC

## 2020-04-18 NOTE — Telephone Encounter (Signed)
Pt requesting return call.  

## 2020-04-18 NOTE — Telephone Encounter (Signed)
Patient requesting a refill of Slynd. She had surgery on her shoulder. She's been going to PT. She's hoping to be able schedule an appointment in a few weeks.

## 2020-04-18 NOTE — Telephone Encounter (Signed)
LMVM to notify her request was submitted. Advised to check with pharmacy prior to p/u.

## 2020-04-18 NOTE — Telephone Encounter (Signed)
Let her know Rx refilled

## 2020-06-03 ENCOUNTER — Other Ambulatory Visit: Payer: Self-pay | Admitting: Obstetrics & Gynecology

## 2020-06-04 NOTE — Telephone Encounter (Signed)
Phone number on file not accepting  Calls at this time unable to leave message for patient to call back to be scheduled

## 2020-06-04 NOTE — Telephone Encounter (Signed)
Sch Annual.  Refill x1

## 2020-06-06 ENCOUNTER — Other Ambulatory Visit: Payer: Self-pay

## 2020-06-06 ENCOUNTER — Ambulatory Visit (INDEPENDENT_AMBULATORY_CARE_PROVIDER_SITE_OTHER): Payer: Medicaid Other | Admitting: Gastroenterology

## 2020-06-06 VITALS — BP 111/72 | HR 92 | Temp 98.5°F | Ht 67.0 in | Wt 220.0 lb

## 2020-06-06 DIAGNOSIS — R140 Abdominal distension (gaseous): Secondary | ICD-10-CM

## 2020-06-06 DIAGNOSIS — R1310 Dysphagia, unspecified: Secondary | ICD-10-CM

## 2020-06-06 DIAGNOSIS — R1319 Other dysphagia: Secondary | ICD-10-CM

## 2020-06-06 MED ORDER — SUPREP BOWEL PREP KIT 17.5-3.13-1.6 GM/177ML PO SOLN: 1.0000 | ORAL | 0 refills | Status: DC
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
## 2020-06-08 ENCOUNTER — Other Ambulatory Visit
Admission: RE | Admit: 2020-06-08 | Discharge: 2020-06-08 | Disposition: A | Discharge status: Home / Self Care | Payer: Medicaid Other | Source: Ambulatory Visit | Attending: Gastroenterology | Admitting: Gastroenterology

## 2020-06-08 ENCOUNTER — Other Ambulatory Visit: Payer: Self-pay

## 2020-06-08 DIAGNOSIS — Z20822 Contact with and (suspected) exposure to covid-19: Secondary | ICD-10-CM | POA: Diagnosis not present

## 2020-06-08 DIAGNOSIS — Z01812 Encounter for preprocedural laboratory examination: Secondary | ICD-10-CM | POA: Diagnosis present

## 2020-06-08 LAB — SARS CORONAVIRUS 2 (TAT 6-24 HRS): SARS Coronavirus 2: NEGATIVE

## 2020-06-12 ENCOUNTER — Encounter
Admission: RE | Disposition: A | Discharge status: Home / Self Care | Payer: Self-pay | Source: Home / Self Care | Attending: Gastroenterology

## 2020-06-12 ENCOUNTER — Other Ambulatory Visit: Payer: Self-pay

## 2020-06-12 ENCOUNTER — Ambulatory Visit: Payer: Medicaid Other | Admitting: Anesthesiology

## 2020-06-12 ENCOUNTER — Encounter: Payer: Self-pay | Admitting: Gastroenterology

## 2020-06-12 ENCOUNTER — Ambulatory Visit
Admission: RE | Admit: 2020-06-12 | Discharge: 2020-06-12 | Disposition: A | Discharge status: Home / Self Care | Payer: Medicaid Other | Attending: Gastroenterology | Admitting: Gastroenterology

## 2020-06-12 DIAGNOSIS — K219 Gastro-esophageal reflux disease without esophagitis: Secondary | ICD-10-CM | POA: Insufficient documentation

## 2020-06-12 DIAGNOSIS — Z85850 Personal history of malignant neoplasm of thyroid: Secondary | ICD-10-CM | POA: Insufficient documentation

## 2020-06-12 DIAGNOSIS — Z91048 Other nonmedicinal substance allergy status: Secondary | ICD-10-CM | POA: Insufficient documentation

## 2020-06-12 DIAGNOSIS — R0609 Other forms of dyspnea: Secondary | ICD-10-CM | POA: Insufficient documentation

## 2020-06-12 DIAGNOSIS — F4310 Post-traumatic stress disorder, unspecified: Secondary | ICD-10-CM | POA: Diagnosis not present

## 2020-06-12 DIAGNOSIS — M5481 Occipital neuralgia: Secondary | ICD-10-CM | POA: Diagnosis not present

## 2020-06-12 DIAGNOSIS — I341 Nonrheumatic mitral (valve) prolapse: Secondary | ICD-10-CM | POA: Insufficient documentation

## 2020-06-12 DIAGNOSIS — Z8614 Personal history of Methicillin resistant Staphylococcus aureus infection: Secondary | ICD-10-CM | POA: Insufficient documentation

## 2020-06-12 DIAGNOSIS — R1084 Generalized abdominal pain: Secondary | ICD-10-CM | POA: Insufficient documentation

## 2020-06-12 DIAGNOSIS — R140 Abdominal distension (gaseous): Secondary | ICD-10-CM | POA: Diagnosis not present

## 2020-06-12 DIAGNOSIS — F419 Anxiety disorder, unspecified: Secondary | ICD-10-CM | POA: Diagnosis not present

## 2020-06-12 DIAGNOSIS — Z79899 Other long term (current) drug therapy: Secondary | ICD-10-CM | POA: Insufficient documentation

## 2020-06-12 DIAGNOSIS — I499 Cardiac arrhythmia, unspecified: Secondary | ICD-10-CM | POA: Insufficient documentation

## 2020-06-12 DIAGNOSIS — F329 Major depressive disorder, single episode, unspecified: Secondary | ICD-10-CM | POA: Diagnosis not present

## 2020-06-12 DIAGNOSIS — G43909 Migraine, unspecified, not intractable, without status migrainosus: Secondary | ICD-10-CM | POA: Insufficient documentation

## 2020-06-12 DIAGNOSIS — K2970 Gastritis, unspecified, without bleeding: Secondary | ICD-10-CM | POA: Diagnosis not present

## 2020-06-12 DIAGNOSIS — J449 Chronic obstructive pulmonary disease, unspecified: Secondary | ICD-10-CM | POA: Insufficient documentation

## 2020-06-12 DIAGNOSIS — Z923 Personal history of irradiation: Secondary | ICD-10-CM | POA: Insufficient documentation

## 2020-06-12 DIAGNOSIS — Z87891 Personal history of nicotine dependence: Secondary | ICD-10-CM | POA: Diagnosis not present

## 2020-06-12 DIAGNOSIS — K319 Disease of stomach and duodenum, unspecified: Secondary | ICD-10-CM | POA: Diagnosis not present

## 2020-06-12 DIAGNOSIS — I10 Essential (primary) hypertension: Secondary | ICD-10-CM | POA: Diagnosis not present

## 2020-06-12 DIAGNOSIS — M5030 Other cervical disc degeneration, unspecified cervical region: Secondary | ICD-10-CM | POA: Insufficient documentation

## 2020-06-12 DIAGNOSIS — Z91013 Allergy to seafood: Secondary | ICD-10-CM | POA: Insufficient documentation

## 2020-06-12 DIAGNOSIS — M1990 Unspecified osteoarthritis, unspecified site: Secondary | ICD-10-CM | POA: Insufficient documentation

## 2020-06-12 DIAGNOSIS — R1319 Other dysphagia: Secondary | ICD-10-CM

## 2020-06-12 DIAGNOSIS — R002 Palpitations: Secondary | ICD-10-CM | POA: Diagnosis not present

## 2020-06-12 DIAGNOSIS — M797 Fibromyalgia: Secondary | ICD-10-CM | POA: Diagnosis not present

## 2020-06-12 DIAGNOSIS — E039 Hypothyroidism, unspecified: Secondary | ICD-10-CM | POA: Diagnosis not present

## 2020-06-12 DIAGNOSIS — Z91040 Latex allergy status: Secondary | ICD-10-CM | POA: Insufficient documentation

## 2020-06-12 DIAGNOSIS — G894 Chronic pain syndrome: Secondary | ICD-10-CM | POA: Diagnosis not present

## 2020-06-12 DIAGNOSIS — R0602 Shortness of breath: Secondary | ICD-10-CM | POA: Insufficient documentation

## 2020-06-12 DIAGNOSIS — Z7951 Long term (current) use of inhaled steroids: Secondary | ICD-10-CM | POA: Insufficient documentation

## 2020-06-12 DIAGNOSIS — R1310 Dysphagia, unspecified: Secondary | ICD-10-CM | POA: Insufficient documentation

## 2020-06-12 DIAGNOSIS — Z888 Allergy status to other drugs, medicaments and biological substances status: Secondary | ICD-10-CM | POA: Insufficient documentation

## 2020-06-12 DIAGNOSIS — M419 Scoliosis, unspecified: Secondary | ICD-10-CM | POA: Insufficient documentation

## 2020-06-12 DIAGNOSIS — Z791 Long term (current) use of non-steroidal anti-inflammatories (NSAID): Secondary | ICD-10-CM | POA: Insufficient documentation

## 2020-06-12 HISTORY — PX: ESOPHAGOGASTRODUODENOSCOPY (EGD) WITH PROPOFOL: SHX5813

## 2020-06-12 HISTORY — PX: COLONOSCOPY WITH PROPOFOL: SHX5780

## 2020-06-12 LAB — POCT PREGNANCY, URINE: Preg Test, Ur: NEGATIVE

## 2020-06-12 SURGERY — COLONOSCOPY WITH PROPOFOL
Anesthesia: General

## 2020-06-12 MED ORDER — SODIUM CHLORIDE 0.9 % IV SOLN: INTRAVENOUS | Status: DC

## 2020-06-12 MED ORDER — PROPOFOL 10 MG/ML IV BOLUS
INTRAVENOUS | Status: DC | PRN
  Administered 2020-06-12 (×4): 50 mg via INTRAVENOUS

## 2020-06-12 MED ORDER — PROPOFOL 500 MG/50ML IV EMUL
INTRAVENOUS | Status: AC
  Filled 2020-06-12: qty 50

## 2020-06-12 MED ORDER — PROPOFOL 500 MG/50ML IV EMUL
INTRAVENOUS | Status: DC | PRN
  Administered 2020-06-12: 125 ug/kg/min via INTRAVENOUS

## 2020-06-12 NOTE — Op Note (Signed)
[facility] Gastroenterology Patient Name: Kristina Li Procedure Date: 06/12/2020 8:45 AM MRN: [id number] Account #: [id number] Date of Birth: Apr 25, 1981 Admit Type: Outpatient Age: 39 Room: [facility] ENDO ROOM 4 Gender: Female Note Status: Finalized Procedure:             Colonoscopy Indications:           Generalized abdominal pain Providers:             [name] MD, MD Medicines:             Propofol per Anesthesia Complications:         No immediate complications. Procedure:             Pre-Anesthesia Assessment:                        - Prior to the procedure, a History and Physical was                         performed, and patient medications and allergies were                         reviewed. The patient's tolerance of previous                         anesthesia was also reviewed. The risks and benefits                         of the procedure and the sedation options and risks                         were discussed with the patient. All questions were                         answered, and informed consent was obtained. Prior                         Anticoagulants: The patient has taken no previous                         anticoagulant or antiplatelet agents. ASA Grade                         Assessment: II - A patient with mild systemic disease.                         After reviewing the risks and benefits, the patient                         was deemed in satisfactory condition to undergo the                         procedure.                        After obtaining informed consent, the colonoscope was                         passed under direct vision. Throughout the procedure,  the patient's blood pressure, pulse, and oxygen                         saturations were monitored continuously. The                         Colonoscope was introduced through the anus and                         advanced to the the cecum,  identified by appendiceal                         orifice and ileocecal valve. The Colonoscope was                         introduced through the anus and advanced to the the                         cecum, identified by appendiceal orifice and ileocecal                         valve. The colonoscopy was performed without                         difficulty. The patient tolerated the procedure well.                         The quality of the bowel preparation was good. Findings:      The perianal and digital rectal examinations were normal.      The colon (entire examined portion) appeared normal. Impression:            - The entire examined colon is normal.                        - No specimens collected. Recommendation:        - Discharge patient to home.                        - Resume previous diet.                        - Continue present medications. Procedure Code(s):     --- Professional ---                        [phone], Colonoscopy, flexible; diagnostic, including                         collection of specimen(s) by brushing or washing, when                         performed (separate procedure) Diagnosis Code(s):     --- Professional ---                        R10.84, Generalized abdominal pain CPT copyright 2019 American Medical Association. All rights reserved. The codes documented in this report are preliminary and upon coder review may  be revised to meet current compliance requirements. [name] MD, MD 06/12/2020 10:16:59 AM This report has been  signed electronically. Number of Addenda: 0 Note Initiated On: 06/12/2020 8:45 AM Scope Withdrawal Time: 0 hours 7 minutes 20 seconds  Total Procedure Duration: 0 hours 11 minutes 22 seconds  Estimated Blood Loss:  Estimated blood loss: none.      [facility]
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
## 2020-06-12 NOTE — Interval H&P Note (Signed)
History and Physical Interval Note:  06/12/2020 8:45 AM  Kristina Li  has presented today for surgery, with the diagnosis of Bloating R14.0 Dysphagia R13.10.  The various methods of treatment have been discussed with the patient and family. After consideration of risks, benefits and other options for treatment, the patient has consented to  Procedure(s): COLONOSCOPY WITH PROPOFOL (N/A) ESOPHAGOGASTRODUODENOSCOPY (EGD) WITH PROPOFOL (N/A) as a surgical intervention.  The patient's history has been reviewed, patient examined, no change in status, stable for surgery.  I have reviewed the patient's chart and labs.  Questions were answered to the patient's satisfaction.     Tilton Marsalis [employer]

## 2020-06-12 NOTE — Op Note (Signed)
[facility] Gastroenterology Patient Name: Kristina Li Procedure Date: 06/12/2020 8:45 AM MRN: [id number] Account #: [id number] Date of Birth: Nov 01, 1981 Admit Type: Outpatient Age: 39 Room: [facility] ENDO ROOM 4 Gender: Female Note Status: Finalized Procedure:             Upper GI endoscopy Indications:           Dysphagia Providers:             [name] MD, MD Medicines:             Propofol per Anesthesia Complications:         No immediate complications. Procedure:             Pre-Anesthesia Assessment:                        - Prior to the procedure, a History and Physical was                         performed, and patient medications and allergies were                         reviewed. The patient's tolerance of previous                         anesthesia was also reviewed. The risks and benefits                         of the procedure and the sedation options and risks                         were discussed with the patient. All questions were                         answered, and informed consent was obtained. Prior                         Anticoagulants: The patient has taken no previous                         anticoagulant or antiplatelet agents. ASA Grade                         Assessment: II - A patient with mild systemic disease.                         After reviewing the risks and benefits, the patient                         was deemed in satisfactory condition to undergo the                         procedure.                        After obtaining informed consent, the endoscope was                         passed under direct vision. Throughout the procedure,  the patient's blood pressure, pulse, and oxygen                         saturations were monitored continuously. The Endoscope                         was introduced through the mouth, and advanced to the                         second part of duodenum. The  upper GI endoscopy was                         accomplished without difficulty. The patient tolerated                         the procedure well. Findings:      The examined esophagus was normal. A TTS dilator was passed through the       scope. Dilation with a 15-16.5-18 mm balloon dilator was performed to 18       mm. The dilation site was examined following endoscope reinsertion and       showed complete resolution of luminal narrowing. Two biopsies were       obtained in the middle third of the esophagus with cold forceps for       histology.      Localized moderate inflammation characterized by erythema was found in       the gastric antrum. Biopsies were taken with a cold forceps for       histology.      The examined duodenum was normal. Impression:            - Normal esophagus. Dilated.                        - Gastritis. Biopsied.                        - Normal examined duodenum.                        - Two biopsies were obtained in the middle third of                         the esophagus. Recommendation:        - Discharge patient to home.                        - Resume previous diet.                        - Continue present medications.                        - Await pathology results. Procedure Code(s):     --- Professional ---                        [phone], Esophagogastroduodenoscopy, flexible,                         transoral; with transendoscopic balloon dilation of  esophagus (less than 30 mm diameter)                        [id number], 59, Esophagogastroduodenoscopy, flexible,                         transoral; with biopsy, single or multiple Diagnosis Code(s):     --- Professional ---                        R13.10, Dysphagia, unspecified                        K29.70, Gastritis, unspecified, without bleeding CPT copyright 2019 American Medical Association. All rights reserved. The codes documented in this report are preliminary and upon coder  review may  be revised to meet current compliance requirements. [name] MD, MD 06/12/2020 10:18:49 AM This report has been signed electronically. Number of Addenda: 0 Note Initiated On: 06/12/2020 8:45 AM Estimated Blood Loss:  Estimated blood loss: none.      [facility]

## 2020-06-12 NOTE — Transfer of Care (Signed)
Immediate Anesthesia Transfer of Care Note  Patient: Kristina Li  Procedure(s) Performed: COLONOSCOPY WITH PROPOFOL (N/A ) ESOPHAGOGASTRODUODENOSCOPY (EGD) WITH PROPOFOL (N/A )  Patient Location: PACU and Endoscopy Unit  Anesthesia Type:General  Level of Consciousness: awake, alert  and oriented  Airway & Oxygen Therapy: Patient Spontanous Breathing  Post-op Assessment: Report given to RN and Post -op Vital signs reviewed and stable  Post vital signs: Reviewed and stable  Last Vitals:  Vitals Value Taken Time  BP    Temp    Pulse 85 06/12/20 1020  Resp 19 06/12/20 1020  SpO2 100 % 06/12/20 1020  Vitals shown include unvalidated device data.  Last Pain:  Vitals:   06/12/20 0814  TempSrc: Temporal  PainSc: 0-No pain         Complications: No complications documented.

## 2020-06-13 ENCOUNTER — Encounter: Payer: Self-pay | Admitting: Gastroenterology

## 2020-06-13 LAB — SURGICAL PATHOLOGY

## 2020-06-13 NOTE — Anesthesia Postprocedure Evaluation (Signed)
Anesthesia Post Note  Patient: Kristina Li  Procedure(s) Performed: COLONOSCOPY WITH PROPOFOL (N/A ) ESOPHAGOGASTRODUODENOSCOPY (EGD) WITH PROPOFOL (N/A )  Patient location during evaluation: Endoscopy Anesthesia Type: General Level of consciousness: awake and alert Pain management: pain level controlled Vital Signs Assessment: post-procedure vital signs reviewed and stable Respiratory status: spontaneous breathing, nonlabored ventilation, respiratory function stable and patient connected to nasal cannula oxygen Cardiovascular status: blood pressure returned to baseline and stable Postop Assessment: no apparent nausea or vomiting Anesthetic complications: no   No complications documented.   Last Vitals:  Vitals:   06/12/20 1030 06/12/20 1040  BP: 127/86 124/83  Pulse: 95 91  Resp: 17 (!) 22  Temp:    SpO2: 100% 100%    Last Pain:  Vitals:   06/13/20 0735  TempSrc:   PainSc: 1                  Martha Clan

## 2020-06-19 ENCOUNTER — Encounter: Payer: Self-pay | Admitting: Gastroenterology

## 2020-07-08 ENCOUNTER — Other Ambulatory Visit: Payer: Self-pay

## 2020-07-08 ENCOUNTER — Emergency Department
Admission: EM | Admit: 2020-07-08 | Discharge: 2020-07-09 | Disposition: A | Discharge status: Home / Self Care | Payer: Medicaid Other | Attending: Emergency Medicine | Admitting: Emergency Medicine

## 2020-07-08 DIAGNOSIS — Z79899 Other long term (current) drug therapy: Secondary | ICD-10-CM | POA: Insufficient documentation

## 2020-07-08 DIAGNOSIS — J45909 Unspecified asthma, uncomplicated: Secondary | ICD-10-CM | POA: Diagnosis not present

## 2020-07-08 DIAGNOSIS — R102 Pelvic and perineal pain: Secondary | ICD-10-CM | POA: Diagnosis not present

## 2020-07-08 DIAGNOSIS — R109 Unspecified abdominal pain: Secondary | ICD-10-CM | POA: Diagnosis present

## 2020-07-08 DIAGNOSIS — Z87891 Personal history of nicotine dependence: Secondary | ICD-10-CM | POA: Diagnosis not present

## 2020-07-08 DIAGNOSIS — I10 Essential (primary) hypertension: Secondary | ICD-10-CM | POA: Insufficient documentation

## 2020-07-08 DIAGNOSIS — Z7951 Long term (current) use of inhaled steroids: Secondary | ICD-10-CM | POA: Diagnosis not present

## 2020-07-08 DIAGNOSIS — N760 Acute vaginitis: Secondary | ICD-10-CM | POA: Insufficient documentation

## 2020-07-08 DIAGNOSIS — B9689 Other specified bacterial agents as the cause of diseases classified elsewhere: Secondary | ICD-10-CM | POA: Insufficient documentation

## 2020-07-08 DIAGNOSIS — C73 Malignant neoplasm of thyroid gland: Secondary | ICD-10-CM | POA: Diagnosis not present

## 2020-07-08 DIAGNOSIS — R1030 Lower abdominal pain, unspecified: Secondary | ICD-10-CM

## 2020-07-08 DIAGNOSIS — E039 Hypothyroidism, unspecified: Secondary | ICD-10-CM | POA: Insufficient documentation

## 2020-07-08 DIAGNOSIS — R1032 Left lower quadrant pain: Secondary | ICD-10-CM

## 2020-07-08 LAB — CBC
HCT: 37 % (ref 36.0–46.0)
Hemoglobin: 11.9 g/dL — ABNORMAL LOW (ref 12.0–15.0)
MCH: 28.2 pg (ref 26.0–34.0)
MCHC: 32.2 g/dL (ref 30.0–36.0)
MCV: 87.7 fL (ref 80.0–100.0)
Platelets: 265 10*3/uL (ref 150–400)
RBC: 4.22 MIL/uL (ref 3.87–5.11)
RDW: 15.4 % (ref 11.5–15.5)
WBC: 8.2 10*3/uL (ref 4.0–10.5)
nRBC: 0 % (ref 0.0–0.2)

## 2020-07-08 NOTE — ED Triage Notes (Signed)
Pt to ED via EMS from home. Per pt she has and lower abd pain and cramping xfew weeks. Pt states she has had normal bowel movements. deneis n/v. Pain right now 0/10. Pt appears bloated.

## 2020-07-09 ENCOUNTER — Emergency Department: Payer: Medicaid Other

## 2020-07-09 LAB — COMPREHENSIVE METABOLIC PANEL
ALT: 41 U/L (ref 0–44)
AST: 24 U/L (ref 15–41)
Albumin: 4.1 g/dL (ref 3.5–5.0)
Alkaline Phosphatase: 72 U/L (ref 38–126)
Anion gap: 9 (ref 5–15)
BUN: 13 mg/dL (ref 6–20)
CO2: 24 mmol/L (ref 22–32)
Calcium: 8.6 mg/dL — ABNORMAL LOW (ref 8.9–10.3)
Chloride: 106 mmol/L (ref 98–111)
Creatinine, Ser: 1.22 mg/dL — ABNORMAL HIGH (ref 0.44–1.00)
GFR calc Af Amer: >60 mL/min (ref >60)
GFR calc non Af Amer: 56 mL/min — ABNORMAL LOW (ref >60)
Glucose, Bld: 136 mg/dL — ABNORMAL HIGH (ref 70–99)
Potassium: 3.3 mmol/L — ABNORMAL LOW (ref 3.5–5.1)
Sodium: 139 mmol/L (ref 135–145)
Total Bilirubin: 0.6 mg/dL (ref 0.3–1.2)
Total Protein: 7.4 g/dL (ref 6.5–8.1)

## 2020-07-09 LAB — LIPASE, BLOOD: Lipase: 37 U/L (ref 11–51)

## 2020-07-09 LAB — WET PREP, GENITAL
Sperm: NONE SEEN
Trich, Wet Prep: NONE SEEN
Yeast Wet Prep HPF POC: NONE SEEN

## 2020-07-09 LAB — CHLAMYDIA/NGC RT PCR (ARMC ONLY)
Chlamydia Tr: NOT DETECTED
N gonorrhoeae: NOT DETECTED

## 2020-07-09 LAB — URINE DRUG SCREEN, QUALITATIVE (ARMC ONLY)
Amphetamines, Ur Screen: NOT DETECTED
Barbiturates, Ur Screen: NOT DETECTED
Benzodiazepine, Ur Scrn: NOT DETECTED
Cannabinoid 50 Ng, Ur ~~LOC~~: NOT DETECTED
Cocaine Metabolite,Ur ~~LOC~~: NOT DETECTED
MDMA (Ecstasy)Ur Screen: NOT DETECTED
Methadone Scn, Ur: NOT DETECTED
Opiate, Ur Screen: NOT DETECTED
Phencyclidine (PCP) Ur S: NOT DETECTED
Tricyclic, Ur Screen: POSITIVE — AB

## 2020-07-09 LAB — URINALYSIS, COMPLETE (UACMP) WITH MICROSCOPIC
Bilirubin Urine: NEGATIVE
Glucose, UA: NEGATIVE mg/dL
Hgb urine dipstick: NEGATIVE
Ketones, ur: NEGATIVE mg/dL
Leukocytes,Ua: NEGATIVE
Nitrite: NEGATIVE
Protein, ur: NEGATIVE mg/dL
Specific Gravity, Urine: 1.011 (ref 1.005–1.030)
pH: 5 (ref 5.0–8.0)

## 2020-07-09 LAB — HCG, QUANTITATIVE, PREGNANCY: hCG, Beta Chain, Quant, S: <1 m[IU]/mL (ref <5)

## 2020-07-09 MED ORDER — METRONIDAZOLE 500 MG PO TABS: 500.0000 mg | ORAL_TABLET | Freq: Two times a day (BID) | ORAL | 0 refills | Status: DC

## 2020-07-09 MED ORDER — IBUPROFEN 600 MG PO TABS
600.0000 mg | ORAL_TABLET | ORAL | Status: AC
  Administered 2020-07-09: 600 mg via ORAL
  Filled 2020-07-09: qty 1

## 2020-07-09 NOTE — ED Notes (Signed)
Pt given crackers and juice at this time.
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
## 2020-07-09 NOTE — ED Notes (Signed)
Pt back from US at this time.

## 2020-07-09 NOTE — Discharge Instructions (Signed)
   Please return to the emergency room right away if you are to develop a fever, severe nausea, your pain becomes severe or worsens, you are unable to keep food down, begin vomiting any dark or bloody fluid, you develop any dark or bloody stools, feel dehydrated, or other new concerns or symptoms arise.  Also, please reach out to your counselor and [facility] regarding a follow-up visit to discuss your symptoms of schizophrenia.

## 2020-07-10 ENCOUNTER — Telehealth: Payer: Self-pay

## 2020-07-10 ENCOUNTER — Other Ambulatory Visit: Payer: Self-pay

## 2020-07-10 LAB — URINE CULTURE: Culture: 20000 — AB

## 2020-07-10 MED ORDER — PANTOPRAZOLE SODIUM 40 MG PO TBEC
40.0000 mg | DELAYED_RELEASE_TABLET | Freq: Every day | ORAL | 6 refills | Status: DC
Start: 2020-07-10 — End: 2023-05-12

## 2020-07-10 NOTE — Telephone Encounter (Signed)
-----   Message from [name], MD sent at 07/09/2020  9:02 AM EDT ----- Regarding: RE: Yes. She can take 40mg . ----- Message ----- From: Glennie Isle, CMA Sent: 07/09/2020 To: [name], MD  Pt called stating she is still having a lot of bloating and some nausea. She said her PCP started her on Pantoprazole 20mg  daily but she doesn't feel like it is working very well. She was wanting to know if she can increase it? I told her I would need to ask you before making a decision that another provider prescribed.

## 2020-07-10 NOTE — Telephone Encounter (Signed)
Pt advised per Dr. Allen Norris, to increase Pantoprazole to 40 mg daily. Rx sent to [facility]. Pt will try this and call back in a few weeks if no improvement.

## 2020-07-26 ENCOUNTER — Emergency Department
Admission: EM | Admit: 2020-07-26 | Discharge: 2020-07-26 | Discharge status: Against Medical Advice | Payer: Medicaid Other

## 2020-07-26 NOTE — ED Notes (Signed)
Pt called for triage x3, no answer. 

## 2020-08-20 ENCOUNTER — Other Ambulatory Visit: Payer: Self-pay | Admitting: Nurse Practitioner

## 2020-08-20 DIAGNOSIS — Z1231 Encounter for screening mammogram for malignant neoplasm of breast: Secondary | ICD-10-CM

## 2020-09-24 ENCOUNTER — Other Ambulatory Visit (INDEPENDENT_AMBULATORY_CARE_PROVIDER_SITE_OTHER): Payer: Self-pay | Admitting: Nurse Practitioner

## 2020-09-24 DIAGNOSIS — I781 Nevus, non-neoplastic: Secondary | ICD-10-CM

## 2020-09-24 DIAGNOSIS — R609 Edema, unspecified: Secondary | ICD-10-CM

## 2020-09-26 ENCOUNTER — Ambulatory Visit (INDEPENDENT_AMBULATORY_CARE_PROVIDER_SITE_OTHER): Payer: Medicaid Other | Admitting: Nurse Practitioner

## 2020-09-26 ENCOUNTER — Ambulatory Visit (INDEPENDENT_AMBULATORY_CARE_PROVIDER_SITE_OTHER): Payer: Medicaid Other

## 2020-09-26 ENCOUNTER — Encounter (INDEPENDENT_AMBULATORY_CARE_PROVIDER_SITE_OTHER): Payer: Self-pay | Admitting: Nurse Practitioner

## 2020-09-26 ENCOUNTER — Other Ambulatory Visit: Payer: Self-pay

## 2020-09-26 VITALS — BP 111/64 | HR 88 | Resp 16 | Ht 66.5 in | Wt 228.6 lb

## 2020-09-26 DIAGNOSIS — I872 Venous insufficiency (chronic) (peripheral): Secondary | ICD-10-CM | POA: Diagnosis not present

## 2020-09-26 DIAGNOSIS — I781 Nevus, non-neoplastic: Secondary | ICD-10-CM

## 2020-09-26 DIAGNOSIS — R609 Edema, unspecified: Secondary | ICD-10-CM

## 2020-09-26 DIAGNOSIS — I10 Essential (primary) hypertension: Secondary | ICD-10-CM | POA: Diagnosis not present

## 2020-09-26 MED ORDER — TRIAMCINOLONE ACETONIDE 0.5 % EX OINT: 1.0000 "application " | TOPICAL_OINTMENT | Freq: Two times a day (BID) | CUTANEOUS | 3 refills | Status: DC

## 2020-10-02 ENCOUNTER — Encounter (INDEPENDENT_AMBULATORY_CARE_PROVIDER_SITE_OTHER): Payer: Self-pay | Admitting: Nurse Practitioner
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
## 2020-10-08 ENCOUNTER — Inpatient Hospital Stay: Admission: RE | Admit: 2020-10-08 | Payer: Medicaid Other | Source: Ambulatory Visit

## 2020-10-15 ENCOUNTER — Encounter: Payer: Self-pay | Admitting: *Deleted

## 2020-10-15 ENCOUNTER — Other Ambulatory Visit: Payer: Self-pay

## 2020-10-15 DIAGNOSIS — F419 Anxiety disorder, unspecified: Secondary | ICD-10-CM | POA: Insufficient documentation

## 2020-10-15 DIAGNOSIS — R440 Auditory hallucinations: Secondary | ICD-10-CM | POA: Diagnosis not present

## 2020-10-15 LAB — URINE DRUG SCREEN, QUALITATIVE (ARMC ONLY)
Amphetamines, Ur Screen: NOT DETECTED
Barbiturates, Ur Screen: POSITIVE — AB
Benzodiazepine, Ur Scrn: NOT DETECTED
Cannabinoid 50 Ng, Ur ~~LOC~~: NOT DETECTED
Cocaine Metabolite,Ur ~~LOC~~: NOT DETECTED
MDMA (Ecstasy)Ur Screen: NOT DETECTED
Methadone Scn, Ur: NOT DETECTED
Opiate, Ur Screen: NOT DETECTED
Phencyclidine (PCP) Ur S: NOT DETECTED
Tricyclic, Ur Screen: POSITIVE — AB

## 2020-10-15 LAB — CBC
HCT: 37.9 % (ref 36.0–46.0)
Hemoglobin: 12.4 g/dL (ref 12.0–15.0)
MCH: 29.3 pg (ref 26.0–34.0)
MCHC: 32.7 g/dL (ref 30.0–36.0)
MCV: 89.6 fL (ref 80.0–100.0)
Platelets: 233 10*3/uL (ref 150–400)
RBC: 4.23 MIL/uL (ref 3.87–5.11)
RDW: 14.6 % (ref 11.5–15.5)
WBC: 6.7 10*3/uL (ref 4.0–10.5)
nRBC: 0 % (ref 0.0–0.2)

## 2020-10-15 LAB — COMPREHENSIVE METABOLIC PANEL
ALT: 26 U/L (ref 0–44)
AST: 18 U/L (ref 15–41)
Albumin: 3.9 g/dL (ref 3.5–5.0)
Alkaline Phosphatase: 72 U/L (ref 38–126)
Anion gap: 11 (ref 5–15)
BUN: 11 mg/dL (ref 6–20)
CO2: 25 mmol/L (ref 22–32)
Calcium: 8.7 mg/dL — ABNORMAL LOW (ref 8.9–10.3)
Chloride: 104 mmol/L (ref 98–111)
Creatinine, Ser: 1.03 mg/dL — ABNORMAL HIGH (ref 0.44–1.00)
GFR, Estimated: >60 mL/min (ref >60)
Glucose, Bld: 126 mg/dL — ABNORMAL HIGH (ref 70–99)
Potassium: 3.7 mmol/L (ref 3.5–5.1)
Sodium: 140 mmol/L (ref 135–145)
Total Bilirubin: 0.4 mg/dL (ref 0.3–1.2)
Total Protein: 7.5 g/dL (ref 6.5–8.1)

## 2020-10-15 LAB — POC URINE PREG, ED: Preg Test, Ur: NEGATIVE

## 2020-10-15 LAB — ETHANOL: Alcohol, Ethyl (B): <10 mg/dL (ref <10)

## 2020-10-15 NOTE — ED Notes (Signed)
Black/white pocketbook Cell phone Black pants Gray jacket [employer] Black sneakers Black mask Black hair tie Black bra Black leggings [employer] panties

## 2020-10-15 NOTE — ED Triage Notes (Signed)
Pt ambulatory to triage.  Pt reports feeling anxious.  Pt reports hearing voices.  Denies SI or HI.  Denies drugs or etoh use.  Pt alert.  Speech clear.

## 2020-10-16 ENCOUNTER — Emergency Department
Admission: EM | Admit: 2020-10-16 | Discharge: 2020-10-16 | Disposition: A | Discharge status: Home / Self Care | Payer: Medicaid Other | Attending: Emergency Medicine | Admitting: Emergency Medicine

## 2020-10-16 DIAGNOSIS — R440 Auditory hallucinations: Secondary | ICD-10-CM

## 2020-10-16 DIAGNOSIS — F419 Anxiety disorder, unspecified: Secondary | ICD-10-CM

## 2020-10-16 NOTE — ED Notes (Signed)
All pt belongings returned to pt at this time.

## 2020-10-16 NOTE — Discharge Instructions (Signed)
As we discussed, although you are having some psychiatric symptoms that are understandably concerning, it does not seem that you are immediately a danger or represent a danger to anyone else.  We talked about staying in the emergency department for additional psychiatric evaluation or going home for close follow-up at [facility], and your preference is to go home.  Since you have the capacity to make your own decisions and do not meet criteria for involuntary commitment, we think this is appropriate.  Please call [facility] first thing in the morning and at least follow-up with your scheduled appointment, but if you can go to the walk-in hours or see them later today, that would be for the best.  If you develop any new or worsening symptoms or feel that you are in danger in any way, please call 911 or return to the emergency department.
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
## 2020-10-30 ENCOUNTER — Ambulatory Visit
Admission: RE | Admit: 2020-10-30 | Discharge: 2020-10-30 | Disposition: A | Discharge status: Home / Self Care | Payer: Medicaid Other | Source: Ambulatory Visit | Attending: Nurse Practitioner | Admitting: Nurse Practitioner

## 2020-10-30 ENCOUNTER — Other Ambulatory Visit: Payer: Self-pay

## 2020-10-30 ENCOUNTER — Encounter (INDEPENDENT_AMBULATORY_CARE_PROVIDER_SITE_OTHER): Payer: Self-pay

## 2020-10-30 DIAGNOSIS — Z1231 Encounter for screening mammogram for malignant neoplasm of breast: Secondary | ICD-10-CM | POA: Diagnosis present

## 2020-11-16 ENCOUNTER — Encounter: Payer: Self-pay | Admitting: Emergency Medicine

## 2020-11-16 ENCOUNTER — Emergency Department
Admission: EM | Admit: 2020-11-16 | Discharge: 2020-11-16 | Disposition: A | Discharge status: Home / Self Care | Payer: Medicaid Other | Attending: Emergency Medicine | Admitting: Emergency Medicine

## 2020-11-16 ENCOUNTER — Other Ambulatory Visit: Payer: Self-pay

## 2020-11-16 DIAGNOSIS — J45909 Unspecified asthma, uncomplicated: Secondary | ICD-10-CM | POA: Diagnosis not present

## 2020-11-16 DIAGNOSIS — Z91040 Latex allergy status: Secondary | ICD-10-CM | POA: Insufficient documentation

## 2020-11-16 DIAGNOSIS — N3090 Cystitis, unspecified without hematuria: Secondary | ICD-10-CM | POA: Diagnosis not present

## 2020-11-16 DIAGNOSIS — E039 Hypothyroidism, unspecified: Secondary | ICD-10-CM | POA: Insufficient documentation

## 2020-11-16 DIAGNOSIS — J449 Chronic obstructive pulmonary disease, unspecified: Secondary | ICD-10-CM | POA: Insufficient documentation

## 2020-11-16 DIAGNOSIS — Z3202 Encounter for pregnancy test, result negative: Secondary | ICD-10-CM | POA: Insufficient documentation

## 2020-11-16 DIAGNOSIS — I10 Essential (primary) hypertension: Secondary | ICD-10-CM | POA: Insufficient documentation

## 2020-11-16 DIAGNOSIS — K2900 Acute gastritis without bleeding: Secondary | ICD-10-CM | POA: Diagnosis not present

## 2020-11-16 DIAGNOSIS — K219 Gastro-esophageal reflux disease without esophagitis: Secondary | ICD-10-CM | POA: Insufficient documentation

## 2020-11-16 DIAGNOSIS — Z87891 Personal history of nicotine dependence: Secondary | ICD-10-CM | POA: Insufficient documentation

## 2020-11-16 DIAGNOSIS — Z9049 Acquired absence of other specified parts of digestive tract: Secondary | ICD-10-CM | POA: Diagnosis not present

## 2020-11-16 DIAGNOSIS — R1010 Upper abdominal pain, unspecified: Secondary | ICD-10-CM | POA: Diagnosis present

## 2020-11-16 DIAGNOSIS — Z79890 Hormone replacement therapy (postmenopausal): Secondary | ICD-10-CM | POA: Diagnosis not present

## 2020-11-16 DIAGNOSIS — Z79899 Other long term (current) drug therapy: Secondary | ICD-10-CM | POA: Insufficient documentation

## 2020-11-16 LAB — COMPREHENSIVE METABOLIC PANEL
ALT: 34 U/L (ref 0–44)
AST: 24 U/L (ref 15–41)
Albumin: 4 g/dL (ref 3.5–5.0)
Alkaline Phosphatase: 71 U/L (ref 38–126)
Anion gap: 10 (ref 5–15)
BUN: 10 mg/dL (ref 6–20)
CO2: 26 mmol/L (ref 22–32)
Calcium: 8.8 mg/dL — ABNORMAL LOW (ref 8.9–10.3)
Chloride: 105 mmol/L (ref 98–111)
Creatinine, Ser: 1.01 mg/dL — ABNORMAL HIGH (ref 0.44–1.00)
GFR, Estimated: >60 mL/min (ref >60)
Glucose, Bld: 104 mg/dL — ABNORMAL HIGH (ref 70–99)
Potassium: 3.3 mmol/L — ABNORMAL LOW (ref 3.5–5.1)
Sodium: 141 mmol/L (ref 135–145)
Total Bilirubin: 0.5 mg/dL (ref 0.3–1.2)
Total Protein: 7.4 g/dL (ref 6.5–8.1)

## 2020-11-16 LAB — URINALYSIS, COMPLETE (UACMP) WITH MICROSCOPIC
Bilirubin Urine: NEGATIVE
Glucose, UA: NEGATIVE mg/dL
Hgb urine dipstick: NEGATIVE
Ketones, ur: NEGATIVE mg/dL
Leukocytes,Ua: NEGATIVE
Nitrite: NEGATIVE
Protein, ur: NEGATIVE mg/dL
Specific Gravity, Urine: 1.008 (ref 1.005–1.030)
pH: 7 (ref 5.0–8.0)

## 2020-11-16 LAB — CBC
HCT: 37.9 % (ref 36.0–46.0)
Hemoglobin: 12.3 g/dL (ref 12.0–15.0)
MCH: 28.5 pg (ref 26.0–34.0)
MCHC: 32.5 g/dL (ref 30.0–36.0)
MCV: 87.7 fL (ref 80.0–100.0)
Platelets: 208 10*3/uL (ref 150–400)
RBC: 4.32 MIL/uL (ref 3.87–5.11)
RDW: 14.1 % (ref 11.5–15.5)
WBC: 7.1 10*3/uL (ref 4.0–10.5)
nRBC: 0 % (ref 0.0–0.2)

## 2020-11-16 LAB — LIPASE, BLOOD: Lipase: 28 U/L (ref 11–51)

## 2020-11-16 LAB — PREGNANCY, URINE: Preg Test, Ur: NEGATIVE

## 2020-11-16 MED ORDER — NITROFURANTOIN MACROCRYSTAL 100 MG PO CAPS: 100.0000 mg | ORAL_CAPSULE | Freq: Two times a day (BID) | ORAL | 0 refills | Status: DC

## 2020-11-16 MED ORDER — ALUMINUM-MAGNESIUM-SIMETHICONE 200-200-20 MG/5ML PO SUSP: 30.0000 mL | Freq: Three times a day (TID) | ORAL | 0 refills | Status: DC

## 2020-11-16 MED ORDER — ALUMINUM-MAGNESIUM-SIMETHICONE 200-200-20 MG/5ML PO SUSP
30.0000 mL | Freq: Three times a day (TID) | ORAL | 0 refills | Status: DC
Start: 2020-11-16 — End: 2020-11-16
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
## 2020-11-16 NOTE — ED Notes (Signed)
DC VS Declined

## 2020-11-19 ENCOUNTER — Telehealth: Payer: Self-pay | Admitting: Gastroenterology

## 2020-11-19 NOTE — Telephone Encounter (Signed)
Patient states she was seen at the ED on 12.10.21 for gastritis and has been having some discomfort in her abd area with straining while having a BM. Pt wanting advice on what to do. Pt last seen 07.2021 for procedures with Dr. Allen Norris.

## 2020-11-19 NOTE — Telephone Encounter (Signed)
Please let the patient know that the diagnosis of gastritis is inflammation of the stomach as seen on a biopsy of her stomach.  If the emergency room did a upper endoscopy and biopsied her stomach then she may have been diagnosed with gastritis otherwise that just their guess.  As for what she should do with that diagnosis I would follow their recommendations.  I have not seen her for gastritis but for trouble swallowing and could not assess her without being seen.  If she would like my opinion on what may be causing her abdominal pain, please have her make any appointment to be seen.

## 2020-11-22 NOTE — Telephone Encounter (Signed)
Pt notified of recommendation and has been scheduled for an office visit to discuss symptoms.

## 2020-12-04 ENCOUNTER — Ambulatory Visit: Payer: Medicaid Other | Admitting: Gastroenterology

## 2020-12-26 DIAGNOSIS — I872 Venous insufficiency (chronic) (peripheral): Secondary | ICD-10-CM | POA: Insufficient documentation
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
## 2020-12-27 ENCOUNTER — Ambulatory Visit (INDEPENDENT_AMBULATORY_CARE_PROVIDER_SITE_OTHER): Payer: Medicaid Other | Admitting: Vascular Surgery

## 2021-01-16 ENCOUNTER — Ambulatory Visit: Payer: Medicaid Other | Admitting: Gastroenterology

## 2021-01-16 ENCOUNTER — Encounter: Payer: Self-pay | Admitting: *Deleted
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
## 2021-01-17 ENCOUNTER — Ambulatory Visit (INDEPENDENT_AMBULATORY_CARE_PROVIDER_SITE_OTHER): Payer: Medicaid Other | Admitting: Vascular Surgery

## 2021-01-17 DIAGNOSIS — I890 Lymphedema, not elsewhere classified: Secondary | ICD-10-CM | POA: Insufficient documentation
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
## 2021-02-03 ENCOUNTER — Other Ambulatory Visit: Payer: Self-pay

## 2021-02-03 DIAGNOSIS — Z5321 Procedure and treatment not carried out due to patient leaving prior to being seen by health care provider: Secondary | ICD-10-CM | POA: Diagnosis not present

## 2021-02-03 DIAGNOSIS — R140 Abdominal distension (gaseous): Secondary | ICD-10-CM | POA: Diagnosis present

## 2021-02-03 LAB — CBC
HCT: 36.9 % (ref 36.0–46.0)
Hemoglobin: 11.9 g/dL — ABNORMAL LOW (ref 12.0–15.0)
MCH: 28.4 pg (ref 26.0–34.0)
MCHC: 32.2 g/dL (ref 30.0–36.0)
MCV: 88.1 fL (ref 80.0–100.0)
Platelets: 209 10*3/uL (ref 150–400)
RBC: 4.19 MIL/uL (ref 3.87–5.11)
RDW: 14.7 % (ref 11.5–15.5)
WBC: 8.3 10*3/uL (ref 4.0–10.5)
nRBC: 0 % (ref 0.0–0.2)

## 2021-02-03 LAB — URINALYSIS, COMPLETE (UACMP) WITH MICROSCOPIC
Bilirubin Urine: NEGATIVE
Glucose, UA: NEGATIVE mg/dL
Hgb urine dipstick: NEGATIVE
Ketones, ur: NEGATIVE mg/dL
Nitrite: NEGATIVE
Protein, ur: NEGATIVE mg/dL
Specific Gravity, Urine: 1.019 (ref 1.005–1.030)
pH: 5 (ref 5.0–8.0)

## 2021-02-03 LAB — COMPREHENSIVE METABOLIC PANEL
ALT: 35 U/L (ref 0–44)
AST: 25 U/L (ref 15–41)
Albumin: 3.9 g/dL (ref 3.5–5.0)
Alkaline Phosphatase: 65 U/L (ref 38–126)
Anion gap: 8 (ref 5–15)
BUN: 12 mg/dL (ref 6–20)
CO2: 22 mmol/L (ref 22–32)
Calcium: 8.7 mg/dL — ABNORMAL LOW (ref 8.9–10.3)
Chloride: 109 mmol/L (ref 98–111)
Creatinine, Ser: 1.14 mg/dL — ABNORMAL HIGH (ref 0.44–1.00)
GFR, Estimated: >60 mL/min (ref >60)
Glucose, Bld: 104 mg/dL — ABNORMAL HIGH (ref 70–99)
Potassium: 3.4 mmol/L — ABNORMAL LOW (ref 3.5–5.1)
Sodium: 139 mmol/L (ref 135–145)
Total Bilirubin: 0.5 mg/dL (ref 0.3–1.2)
Total Protein: 7.1 g/dL (ref 6.5–8.1)

## 2021-02-03 NOTE — ED Triage Notes (Signed)
Pt complains of bloating to abd down into thighs. Pt also states she feels "some movement around my belly button". Pt denies edema in hands of feet, pain.

## 2021-02-04 ENCOUNTER — Emergency Department
Admission: EM | Admit: 2021-02-04 | Discharge: 2021-02-04 | Disposition: A | Discharge status: Home / Self Care | Payer: Medicaid Other | Attending: Emergency Medicine | Admitting: Emergency Medicine

## 2021-02-04 LAB — BRAIN NATRIURETIC PEPTIDE: B Natriuretic Peptide: 28.2 pg/mL (ref 0.0–100.0)

## 2021-02-04 NOTE — ED Notes (Signed)
Pt talking on phone in lobby in no acute distress.

## 2021-02-04 NOTE — ED Notes (Signed)
Informed by shameka in registration that pt left.

## 2021-02-04 NOTE — ED Notes (Signed)
Pt ambulatory in lobby in no acute distress.

## 2021-03-14 ENCOUNTER — Other Ambulatory Visit: Payer: Self-pay

## 2021-03-14 DIAGNOSIS — I872 Venous insufficiency (chronic) (peripheral): Secondary | ICD-10-CM | POA: Insufficient documentation

## 2021-03-14 DIAGNOSIS — J449 Chronic obstructive pulmonary disease, unspecified: Secondary | ICD-10-CM | POA: Insufficient documentation

## 2021-03-14 DIAGNOSIS — E039 Hypothyroidism, unspecified: Secondary | ICD-10-CM | POA: Insufficient documentation

## 2021-03-14 DIAGNOSIS — I10 Essential (primary) hypertension: Secondary | ICD-10-CM | POA: Insufficient documentation

## 2021-03-14 DIAGNOSIS — R079 Chest pain, unspecified: Secondary | ICD-10-CM | POA: Insufficient documentation

## 2021-03-14 DIAGNOSIS — R443 Hallucinations, unspecified: Secondary | ICD-10-CM | POA: Diagnosis present

## 2021-03-14 DIAGNOSIS — Z91040 Latex allergy status: Secondary | ICD-10-CM | POA: Insufficient documentation

## 2021-03-14 DIAGNOSIS — Z79899 Other long term (current) drug therapy: Secondary | ICD-10-CM | POA: Diagnosis not present

## 2021-03-14 DIAGNOSIS — Z87891 Personal history of nicotine dependence: Secondary | ICD-10-CM | POA: Insufficient documentation

## 2021-03-14 DIAGNOSIS — J45909 Unspecified asthma, uncomplicated: Secondary | ICD-10-CM | POA: Diagnosis not present

## 2021-03-14 DIAGNOSIS — Z20822 Contact with and (suspected) exposure to covid-19: Secondary | ICD-10-CM | POA: Diagnosis not present

## 2021-03-14 DIAGNOSIS — M461 Sacroiliitis, not elsewhere classified: Secondary | ICD-10-CM | POA: Insufficient documentation

## 2021-03-14 DIAGNOSIS — F419 Anxiety disorder, unspecified: Secondary | ICD-10-CM | POA: Diagnosis not present

## 2021-03-14 DIAGNOSIS — D509 Iron deficiency anemia, unspecified: Secondary | ICD-10-CM | POA: Diagnosis not present

## 2021-03-14 DIAGNOSIS — Z85850 Personal history of malignant neoplasm of thyroid: Secondary | ICD-10-CM | POA: Insufficient documentation

## 2021-03-14 DIAGNOSIS — M797 Fibromyalgia: Secondary | ICD-10-CM | POA: Insufficient documentation

## 2021-03-14 DIAGNOSIS — F203 Undifferentiated schizophrenia: Secondary | ICD-10-CM | POA: Insufficient documentation

## 2021-03-14 DIAGNOSIS — M5136 Other intervertebral disc degeneration, lumbar region: Secondary | ICD-10-CM | POA: Diagnosis not present

## 2021-03-14 NOTE — ED Triage Notes (Signed)
Patient states sometimes she hears voices which tell her they're going to make her jump out of the window or that "they have put stuff down in me." Pt also feels that abdomen size often fluctuates and is not sure it that's due to objects placed in her.

## 2021-03-14 NOTE — ED Triage Notes (Addendum)
Pt reports went to sleep like normal and woke up feeling anxious. States she could hear someone talking to her and states voice sounded familiar. Reports hx of anxiety and auditory hallucinations. Denies SI or HI. Pt calm and cooperative in triage. Denies continuing to hear voices at this time. Patient denies chest pain or SOB. Reports compliance with anxiety medications at home.

## 2021-03-15 ENCOUNTER — Emergency Department: Payer: No Typology Code available for payment source

## 2021-03-15 ENCOUNTER — Emergency Department
Admission: EM | Admit: 2021-03-15 | Discharge: 2021-03-15 | Disposition: A | Discharge status: Home / Self Care | Payer: No Typology Code available for payment source | Attending: Emergency Medicine | Admitting: Emergency Medicine

## 2021-03-15 DIAGNOSIS — I872 Venous insufficiency (chronic) (peripheral): Secondary | ICD-10-CM | POA: Diagnosis present

## 2021-03-15 DIAGNOSIS — M533 Sacrococcygeal disorders, not elsewhere classified: Secondary | ICD-10-CM | POA: Diagnosis present

## 2021-03-15 DIAGNOSIS — R079 Chest pain, unspecified: Secondary | ICD-10-CM | POA: Diagnosis present

## 2021-03-15 DIAGNOSIS — F203 Undifferentiated schizophrenia: Secondary | ICD-10-CM | POA: Diagnosis present

## 2021-03-15 DIAGNOSIS — M51369 Other intervertebral disc degeneration, lumbar region without mention of lumbar back pain or lower extremity pain: Secondary | ICD-10-CM | POA: Diagnosis present

## 2021-03-15 DIAGNOSIS — D509 Iron deficiency anemia, unspecified: Secondary | ICD-10-CM | POA: Diagnosis present

## 2021-03-15 DIAGNOSIS — M5136 Other intervertebral disc degeneration, lumbar region: Secondary | ICD-10-CM | POA: Diagnosis present

## 2021-03-15 DIAGNOSIS — F419 Anxiety disorder, unspecified: Secondary | ICD-10-CM

## 2021-03-15 DIAGNOSIS — R440 Auditory hallucinations: Secondary | ICD-10-CM

## 2021-03-15 DIAGNOSIS — M797 Fibromyalgia: Secondary | ICD-10-CM | POA: Diagnosis present

## 2021-03-15 LAB — COMPREHENSIVE METABOLIC PANEL
ALT: 29 U/L (ref 0–44)
AST: 24 U/L (ref 15–41)
Albumin: 4 g/dL (ref 3.5–5.0)
Alkaline Phosphatase: 65 U/L (ref 38–126)
Anion gap: 9 (ref 5–15)
BUN: 12 mg/dL (ref 6–20)
CO2: 23 mmol/L (ref 22–32)
Calcium: 8.9 mg/dL (ref 8.9–10.3)
Chloride: 107 mmol/L (ref 98–111)
Creatinine, Ser: 1.08 mg/dL — ABNORMAL HIGH (ref 0.44–1.00)
GFR, Estimated: >60 mL/min (ref >60)
Glucose, Bld: 96 mg/dL (ref 70–99)
Potassium: 3.5 mmol/L (ref 3.5–5.1)
Sodium: 139 mmol/L (ref 135–145)
Total Bilirubin: 0.5 mg/dL (ref 0.3–1.2)
Total Protein: 7.3 g/dL (ref 6.5–8.1)

## 2021-03-15 LAB — CBC
HCT: 35.9 % — ABNORMAL LOW (ref 36.0–46.0)
Hemoglobin: 11.8 g/dL — ABNORMAL LOW (ref 12.0–15.0)
MCH: 28.3 pg (ref 26.0–34.0)
MCHC: 32.9 g/dL (ref 30.0–36.0)
MCV: 86.1 fL (ref 80.0–100.0)
Platelets: 197 10*3/uL (ref 150–400)
RBC: 4.17 MIL/uL (ref 3.87–5.11)
RDW: 14.6 % (ref 11.5–15.5)
WBC: 6.9 10*3/uL (ref 4.0–10.5)
nRBC: 0 % (ref 0.0–0.2)

## 2021-03-15 LAB — ETHANOL: Alcohol, Ethyl (B): <10 mg/dL (ref <10)

## 2021-03-15 LAB — URINE DRUG SCREEN, QUALITATIVE (ARMC ONLY)
Amphetamines, Ur Screen: NOT DETECTED
Barbiturates, Ur Screen: NOT DETECTED
Benzodiazepine, Ur Scrn: NOT DETECTED
Cannabinoid 50 Ng, Ur ~~LOC~~: NOT DETECTED
Cocaine Metabolite,Ur ~~LOC~~: NOT DETECTED
MDMA (Ecstasy)Ur Screen: NOT DETECTED
Methadone Scn, Ur: NOT DETECTED
Opiate, Ur Screen: NOT DETECTED
Phencyclidine (PCP) Ur S: NOT DETECTED
Tricyclic, Ur Screen: POSITIVE — AB

## 2021-03-15 LAB — RESP PANEL BY RT-PCR (FLU A&B, COVID) ARPGX2
Influenza A by PCR: NEGATIVE
Influenza B by PCR: NEGATIVE
SARS Coronavirus 2 by RT PCR: NEGATIVE

## 2021-03-15 LAB — ACETAMINOPHEN LEVEL: Acetaminophen (Tylenol), Serum: <10 ug/mL — ABNORMAL LOW (ref 10–30)

## 2021-03-15 LAB — POC URINE PREG, ED: Preg Test, Ur: NEGATIVE

## 2021-03-15 LAB — SALICYLATE LEVEL: Salicylate Lvl: <7 mg/dL — ABNORMAL LOW (ref 7.0–30.0)

## 2021-03-15 MED ORDER — DIPHENHYDRAMINE HCL 25 MG PO CAPS
50.0000 mg | ORAL_CAPSULE | Freq: Once | ORAL | Status: AC
  Administered 2021-03-15: 50 mg via ORAL
  Filled 2021-03-15: qty 2

## 2021-03-15 MED ORDER — PALIPERIDONE ER 6 MG PO TB24: 6.0000 mg | ORAL_TABLET | Freq: Every day | ORAL | 1 refills | Status: DC

## 2021-03-15 NOTE — ED Notes (Signed)
Hourly rounding completed at this time, patient currently awake in room. No complaints, stable, and in no acute distress. Q15 minute rounds and monitoring via Rover and Officer to continue. °
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
## 2021-03-15 NOTE — ED Notes (Signed)
Pt to be transferred to the [facility], Report to Maudie Mercury, Therapist, sports at this time

## 2021-03-15 NOTE — ED Notes (Signed)
Patient transferred from Triage to room 22 after dressing out and screening for contraband. Report received from [place], [place] including situation, background, assessment and recommendations. Pt oriented to [employer] including Q15 minute rounds as well as Engineer, drilling for their protection. Patient is alert and oriented, warm and dry in no acute distress. Patient denies SI and HI. Pt. Encouraged to let this nurse know if needs arise.

## 2021-03-15 NOTE — ED Notes (Signed)
Pt reports that she woke up tonight due to experiencing anxiety and feels like her throat is swelling, states when she woke up it felt like something was over her throat. PT reports being up to date on her medications. States she has been experiencing AH at home for some time, states it sounds like she hears voices in her home when she is in other rooms. Reports shadows being seen at times. Pt is calm and cooperative and states she wants to make sure everything is okay

## 2021-03-15 NOTE — ED Notes (Signed)
Pt discharged home. VS stable. Pt denies SI.  Discharge instructions reviewed with patient. All belongings returned to patient.

## 2021-03-15 NOTE — ED Notes (Signed)
Meal tray placed in room

## 2021-03-15 NOTE — Consult Note (Signed)
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 that she is not having any thoughts of doing anything dangerous and that she is very willing to engage in appropriate treatment.  She does not meet commitment criteria and so although I had suggested she consider admission she declines to do it.  Instead I suggest we increase her dose of Invega to 6 mg as the standing dose.  I reassured her that that was still well within and really on the lower end of the normal dosing range.  She is agreeable to this.  I put in a new prescription to [facility] and have notified the [place] liaison of the decision.  Case reviewed with ER physician.  Patient will be discharged  home with her act team as follow-up.  Disposition: No evidence of imminent risk to self or others at present.   Supportive therapy provided about ongoing stressors. Discussed crisis plan, support from social network, calling 911, coming to the Emergency Department, and calling Suicide Hotline.  Alethia Berthold, MD 03/15/2021 11:29 AM

## 2021-03-15 NOTE — ED Provider Notes (Addendum)
The patient has been evaluated at bedside by Dr. Weber Cooks, psychiatry.  Patient is clinically stable.  Not felt to be a danger to self or others.  No SI or Hi.  No indication for inpatient psychiatric admission at this time.  Per Dr. Weber Cooks , plan is to increase her Invega. Appropriate for continued outpatient therapy.Tyrone Nine, MD 03/15/21 1114    Merlyn Lot, MD 03/15/21 1115
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
## 2021-03-15 NOTE — ED Notes (Signed)
Psych NP and TTS to bedside
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
## 2021-03-15 NOTE — ED Notes (Signed)
Pt. To BHU from ED ambulatory without difficulty, to room  BHU 5. Report from [facility]. Pt. Is alert and oriented, warm and dry in no distress. Pt. Denies SI, HI, and AVH. Pt. Calm and cooperative. Patient was given graham crackers and soda. Pt. Made aware of security cameras and Q15 minute rounds. Pt. Encouraged to let Nursing staff know of any concerns or needs.

## 2021-03-15 NOTE — ED Notes (Signed)
Pt sleeping. VS will be taken once awake. 

## 2021-03-15 NOTE — ED Notes (Signed)
Dr.[name] at bedside  

## 2021-04-09 ENCOUNTER — Other Ambulatory Visit: Payer: Self-pay

## 2021-04-09 ENCOUNTER — Emergency Department: Payer: Medicaid Other

## 2021-04-09 DIAGNOSIS — R1030 Lower abdominal pain, unspecified: Secondary | ICD-10-CM | POA: Insufficient documentation

## 2021-04-09 DIAGNOSIS — Z87891 Personal history of nicotine dependence: Secondary | ICD-10-CM | POA: Diagnosis not present

## 2021-04-09 DIAGNOSIS — Z91040 Latex allergy status: Secondary | ICD-10-CM | POA: Diagnosis not present

## 2021-04-09 DIAGNOSIS — J45909 Unspecified asthma, uncomplicated: Secondary | ICD-10-CM | POA: Insufficient documentation

## 2021-04-09 DIAGNOSIS — M7989 Other specified soft tissue disorders: Secondary | ICD-10-CM | POA: Diagnosis present

## 2021-04-09 DIAGNOSIS — E039 Hypothyroidism, unspecified: Secondary | ICD-10-CM | POA: Diagnosis not present

## 2021-04-09 DIAGNOSIS — Z79899 Other long term (current) drug therapy: Secondary | ICD-10-CM | POA: Diagnosis not present

## 2021-04-09 DIAGNOSIS — J449 Chronic obstructive pulmonary disease, unspecified: Secondary | ICD-10-CM | POA: Diagnosis not present

## 2021-04-09 DIAGNOSIS — I10 Essential (primary) hypertension: Secondary | ICD-10-CM | POA: Insufficient documentation

## 2021-04-09 DIAGNOSIS — R609 Edema, unspecified: Secondary | ICD-10-CM | POA: Insufficient documentation

## 2021-04-09 DIAGNOSIS — Z85850 Personal history of malignant neoplasm of thyroid: Secondary | ICD-10-CM | POA: Diagnosis not present

## 2021-04-09 LAB — CBC WITH DIFFERENTIAL/PLATELET
Abs Immature Granulocytes: 0.05 10*3/uL (ref 0.00–0.07)
Basophils Absolute: 0 10*3/uL (ref 0.0–0.1)
Basophils Relative: 1 %
Eosinophils Absolute: 0.1 10*3/uL (ref 0.0–0.5)
Eosinophils Relative: 2 %
HCT: 34.3 % — ABNORMAL LOW (ref 36.0–46.0)
Hemoglobin: 11.3 g/dL — ABNORMAL LOW (ref 12.0–15.0)
Immature Granulocytes: 1 %
Lymphocytes Relative: 30 %
Lymphs Abs: 2.1 10*3/uL (ref 0.7–4.0)
MCH: 28.5 pg (ref 26.0–34.0)
MCHC: 32.9 g/dL (ref 30.0–36.0)
MCV: 86.4 fL (ref 80.0–100.0)
Monocytes Absolute: 0.7 10*3/uL (ref 0.1–1.0)
Monocytes Relative: 11 %
Neutro Abs: 4 10*3/uL (ref 1.7–7.7)
Neutrophils Relative %: 55 %
Platelets: 194 10*3/uL (ref 150–400)
RBC: 3.97 MIL/uL (ref 3.87–5.11)
RDW: 15.1 % (ref 11.5–15.5)
WBC: 7.1 10*3/uL (ref 4.0–10.5)
nRBC: 0 % (ref 0.0–0.2)

## 2021-04-09 LAB — BASIC METABOLIC PANEL
Anion gap: 9 (ref 5–15)
BUN: 11 mg/dL (ref 6–20)
CO2: 23 mmol/L (ref 22–32)
Calcium: 8.8 mg/dL — ABNORMAL LOW (ref 8.9–10.3)
Chloride: 108 mmol/L (ref 98–111)
Creatinine, Ser: 1.14 mg/dL — ABNORMAL HIGH (ref 0.44–1.00)
GFR, Estimated: >60 mL/min (ref >60)
Glucose, Bld: 108 mg/dL — ABNORMAL HIGH (ref 70–99)
Potassium: 3 mmol/L — ABNORMAL LOW (ref 3.5–5.1)
Sodium: 140 mmol/L (ref 135–145)

## 2021-04-09 LAB — TROPONIN I (HIGH SENSITIVITY): Troponin I (High Sensitivity): 3 ng/L (ref <18)

## 2021-04-09 LAB — BRAIN NATRIURETIC PEPTIDE: B Natriuretic Peptide: 10.3 pg/mL (ref 0.0–100.0)

## 2021-04-09 NOTE — ED Triage Notes (Signed)
Pt to ED via EMS from home, pt states she has had leg swelling and abd swelling for a few days. Pt states she is also having some chest pain and shortness of breath.

## 2021-04-10 ENCOUNTER — Emergency Department: Payer: Medicaid Other

## 2021-04-10 ENCOUNTER — Emergency Department
Admission: EM | Admit: 2021-04-10 | Discharge: 2021-04-10 | Disposition: A | Discharge status: Home / Self Care | Payer: Medicaid Other | Attending: Emergency Medicine | Admitting: Emergency Medicine

## 2021-04-10 DIAGNOSIS — N83209 Unspecified ovarian cyst, unspecified side: Secondary | ICD-10-CM

## 2021-04-10 DIAGNOSIS — R1030 Lower abdominal pain, unspecified: Secondary | ICD-10-CM

## 2021-04-10 DIAGNOSIS — R600 Localized edema: Secondary | ICD-10-CM

## 2021-04-10 LAB — URINE DRUG SCREEN, QUALITATIVE (ARMC ONLY)
Amphetamines, Ur Screen: NOT DETECTED
Barbiturates, Ur Screen: NOT DETECTED
Benzodiazepine, Ur Scrn: NOT DETECTED
Cannabinoid 50 Ng, Ur ~~LOC~~: NOT DETECTED
Cocaine Metabolite,Ur ~~LOC~~: NOT DETECTED
MDMA (Ecstasy)Ur Screen: NOT DETECTED
Methadone Scn, Ur: NOT DETECTED
Opiate, Ur Screen: NOT DETECTED
Phencyclidine (PCP) Ur S: NOT DETECTED
Tricyclic, Ur Screen: POSITIVE — AB

## 2021-04-10 LAB — TROPONIN I (HIGH SENSITIVITY): Troponin I (High Sensitivity): 4 ng/L (ref <18)

## 2021-04-10 LAB — D-DIMER, QUANTITATIVE: D-Dimer, Quant: 2.51 ug/mL-FEU — ABNORMAL HIGH (ref 0.00–0.50)

## 2021-04-10 LAB — PREGNANCY, URINE: Preg Test, Ur: NEGATIVE

## 2021-04-10 MED ORDER — IOHEXOL 350 MG/ML SOLN
75.0000 mL | Freq: Once | INTRAVENOUS | Status: AC | PRN
  Administered 2021-04-10: 75 mL via INTRAVENOUS

## 2021-04-10 MED ORDER — POTASSIUM CHLORIDE CRYS ER 20 MEQ PO TBCR
40.0000 meq | EXTENDED_RELEASE_TABLET | Freq: Once | ORAL | Status: AC
  Administered 2021-04-10: 40 meq via ORAL
  Filled 2021-04-10: qty 2

## 2021-04-10 MED ORDER — KETOROLAC TROMETHAMINE 30 MG/ML IJ SOLN
15.0000 mg | Freq: Once | INTRAMUSCULAR | Status: AC
  Administered 2021-04-10: 15 mg via INTRAVENOUS
  Filled 2021-04-10: qty 1

## 2021-04-10 MED ORDER — POLYETHYLENE GLYCOL 3350 17 G PO PACK: 17.0000 g | PACK | Freq: Every day | ORAL | 0 refills | Status: AC

## 2021-04-10 NOTE — ED Notes (Signed)
D/C and new RX and reasons to return to ED discussed with pt, pt verbalized understanding. NAD noted. VSS.

## 2021-04-10 NOTE — ED Notes (Signed)
This RN attempted to D/C pt but pt requested apple juice before leaving. Pt also requested mira lax  be sent via RX instead of picking up OTC due to medicaid issues, MD aware and new RX given.

## 2021-04-10 NOTE — ED Notes (Addendum)
No answer when called several times from lobby for vs and troponin recheck

## 2021-04-10 NOTE — ED Notes (Signed)
No answer when called several times from lobby 

## 2021-04-10 NOTE — Discharge Instructions (Signed)
Work-up was reassuring.  There is a lot of constipation.  Take a capful of MiraLAX daily.  Turned to the ER for any other concerns otherwise follow with your primary care doctor
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
## 2021-04-10 NOTE — ED Notes (Signed)
Pt at U/S

## 2021-04-10 NOTE — ED Notes (Signed)
No answer when called several times from lobby; no answer when cell phone # listed on chart called

## 2021-04-10 NOTE — ED Provider Notes (Signed)
12:16 PM Assumed care for off going team.   Blood pressure 120/70, pulse 100, temperature 98.5 F (36.9 C), temperature source Oral, resp. rate 17, height 5\' 6"  (1.676 m), weight 95.3 kg, SpO2 100 %.  See their HPI for full report but in brief Pelvic US pending-- dc  Chronic leg swelling- chest pain, sob---CTA negative.    KUB is consistent with constipation and ultrasound is negative.  Patient on repeat evaluation has a soft and nontender abdominal exam.  She stated that the pain seem more to be in the upper abdomen but Dr. Tamala Julian noted that it was in the lower abdomen.  However on my exam she is not tender at all   she then starts to complain saying that she is not getting her urine out all the way.  However ultrasound post void is normal.  Patient has been tolerating eating food and has been requesting additional food.  At this time will discharge patient.  We discussed stool regime.   I discussed the provisional nature of ED diagnosis, the treatment so far, the ongoing plan of care, follow up appointments and return precautions with the patient and any family or support people present. They expressed understanding and agreed with the plan, discharged home.         Vanessa Refugio, MD 04/10/21 [phone]

## 2021-04-10 NOTE — ED Notes (Signed)
Pt found sleeping in lobby

## 2021-05-02 ENCOUNTER — Other Ambulatory Visit: Payer: Self-pay | Admitting: Obstetrics & Gynecology

## 2021-05-03 ENCOUNTER — Other Ambulatory Visit: Payer: Self-pay | Admitting: Psychiatry

## 2021-09-25 ENCOUNTER — Other Ambulatory Visit (INDEPENDENT_AMBULATORY_CARE_PROVIDER_SITE_OTHER): Payer: Self-pay | Admitting: Cardiovascular Disease

## 2021-09-25 DIAGNOSIS — I70213 Atherosclerosis of native arteries of extremities with intermittent claudication, bilateral legs: Secondary | ICD-10-CM

## 2021-09-27 ENCOUNTER — Ambulatory Visit (INDEPENDENT_AMBULATORY_CARE_PROVIDER_SITE_OTHER): Payer: Medicaid Other

## 2021-09-27 ENCOUNTER — Other Ambulatory Visit: Payer: Self-pay

## 2021-09-27 ENCOUNTER — Encounter: Payer: Self-pay | Admitting: Oncology

## 2021-09-27 ENCOUNTER — Ambulatory Visit (INDEPENDENT_AMBULATORY_CARE_PROVIDER_SITE_OTHER): Payer: Medicaid Other | Admitting: Nurse Practitioner

## 2021-09-27 ENCOUNTER — Encounter (INDEPENDENT_AMBULATORY_CARE_PROVIDER_SITE_OTHER): Payer: Self-pay | Admitting: Nurse Practitioner

## 2021-09-27 ENCOUNTER — Telehealth (INDEPENDENT_AMBULATORY_CARE_PROVIDER_SITE_OTHER): Payer: Self-pay

## 2021-09-27 VITALS — BP 122/82 | HR 91 | Resp 16 | Wt 235.8 lb

## 2021-09-27 DIAGNOSIS — I10 Essential (primary) hypertension: Secondary | ICD-10-CM

## 2021-09-27 DIAGNOSIS — I70213 Atherosclerosis of native arteries of extremities with intermittent claudication, bilateral legs: Secondary | ICD-10-CM

## 2021-09-27 DIAGNOSIS — I70212 Atherosclerosis of native arteries of extremities with intermittent claudication, left leg: Secondary | ICD-10-CM

## 2021-09-27 DIAGNOSIS — I872 Venous insufficiency (chronic) (peripheral): Secondary | ICD-10-CM | POA: Diagnosis not present
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
## 2021-09-27 NOTE — Telephone Encounter (Signed)
Spoke with the patient and she is scheduled with Dr. Lucky Cowboy for a RLE angio on 09/30/21 with a 9:30 am arrival time to the [facility]. Pre-procedure instructions were discussed and will be handed to the patient.

## 2021-09-30 ENCOUNTER — Ambulatory Visit
Admission: RE | Admit: 2021-09-30 | Discharge: 2021-09-30 | Disposition: A | Discharge status: Home / Self Care | Payer: Medicaid Other | Attending: Vascular Surgery | Admitting: Vascular Surgery

## 2021-09-30 ENCOUNTER — Encounter
Admission: RE | Disposition: A | Discharge status: Home / Self Care | Payer: Self-pay | Source: Home / Self Care | Attending: Vascular Surgery

## 2021-09-30 ENCOUNTER — Other Ambulatory Visit (INDEPENDENT_AMBULATORY_CARE_PROVIDER_SITE_OTHER): Payer: Self-pay | Admitting: Nurse Practitioner

## 2021-09-30 ENCOUNTER — Other Ambulatory Visit: Payer: Self-pay

## 2021-09-30 ENCOUNTER — Encounter: Payer: Self-pay | Admitting: Vascular Surgery

## 2021-09-30 DIAGNOSIS — Z91040 Latex allergy status: Secondary | ICD-10-CM | POA: Diagnosis not present

## 2021-09-30 DIAGNOSIS — Z91013 Allergy to seafood: Secondary | ICD-10-CM | POA: Diagnosis not present

## 2021-09-30 DIAGNOSIS — Z87891 Personal history of nicotine dependence: Secondary | ICD-10-CM | POA: Insufficient documentation

## 2021-09-30 DIAGNOSIS — I70211 Atherosclerosis of native arteries of extremities with intermittent claudication, right leg: Secondary | ICD-10-CM | POA: Diagnosis not present

## 2021-09-30 DIAGNOSIS — I70213 Atherosclerosis of native arteries of extremities with intermittent claudication, bilateral legs: Secondary | ICD-10-CM

## 2021-09-30 DIAGNOSIS — I10 Essential (primary) hypertension: Secondary | ICD-10-CM | POA: Diagnosis not present

## 2021-09-30 DIAGNOSIS — Z79899 Other long term (current) drug therapy: Secondary | ICD-10-CM | POA: Insufficient documentation

## 2021-09-30 DIAGNOSIS — Z91048 Other nonmedicinal substance allergy status: Secondary | ICD-10-CM | POA: Insufficient documentation

## 2021-09-30 DIAGNOSIS — Z888 Allergy status to other drugs, medicaments and biological substances status: Secondary | ICD-10-CM | POA: Diagnosis not present

## 2021-09-30 DIAGNOSIS — Z7901 Long term (current) use of anticoagulants: Secondary | ICD-10-CM | POA: Insufficient documentation

## 2021-09-30 DIAGNOSIS — I70219 Atherosclerosis of native arteries of extremities with intermittent claudication, unspecified extremity: Secondary | ICD-10-CM

## 2021-09-30 DIAGNOSIS — Z79890 Hormone replacement therapy (postmenopausal): Secondary | ICD-10-CM | POA: Diagnosis not present

## 2021-09-30 HISTORY — PX: LOWER EXTREMITY ANGIOGRAPHY: CATH118251

## 2021-09-30 LAB — CREATININE, SERUM
Creatinine, Ser: 1.17 mg/dL — ABNORMAL HIGH (ref 0.44–1.00)
GFR, Estimated: >60 mL/min (ref >60)

## 2021-09-30 LAB — BUN: BUN: 9 mg/dL (ref 6–20)

## 2021-09-30 SURGERY — LOWER EXTREMITY ANGIOGRAPHY
Anesthesia: Moderate Sedation | Laterality: Right

## 2021-09-30 MED ORDER — ONDANSETRON HCL 4 MG/2ML IJ SOLN
4.0000 mg | Freq: Four times a day (QID) | INTRAMUSCULAR | Status: DC | PRN
  Administered 2021-09-30: 4 mg via INTRAVENOUS

## 2021-09-30 MED ORDER — HYDROCODONE-ACETAMINOPHEN 5-325 MG PO TABS
1.0000 | ORAL_TABLET | Freq: Once | ORAL | Status: AC
  Administered 2021-09-30: 1 via ORAL

## 2021-09-30 MED ORDER — MIDAZOLAM HCL 2 MG/ML PO SYRP: 8.0000 mg | ORAL_SOLUTION | Freq: Once | ORAL | Status: DC | PRN

## 2021-09-30 MED ORDER — FENTANYL CITRATE PF 50 MCG/ML IJ SOSY
PREFILLED_SYRINGE | INTRAMUSCULAR | Status: AC
  Filled 2021-09-30: qty 2

## 2021-09-30 MED ORDER — MIDAZOLAM HCL 2 MG/2ML IJ SOLN
INTRAMUSCULAR | Status: DC | PRN
  Administered 2021-09-30: 1 mg via INTRAVENOUS
  Administered 2021-09-30: 2 mg via INTRAVENOUS

## 2021-09-30 MED ORDER — SODIUM CHLORIDE 0.9 % IV SOLN: INTRAVENOUS | Status: DC

## 2021-09-30 MED ORDER — HEPARIN SODIUM (PORCINE) 1000 UNIT/ML IJ SOLN
INTRAMUSCULAR | Status: AC
  Filled 2021-09-30: qty 1

## 2021-09-30 MED ORDER — CEFAZOLIN SODIUM-DEXTROSE 2-4 GM/100ML-% IV SOLN
2.0000 g | Freq: Once | INTRAVENOUS | Status: AC
  Administered 2021-09-30: 2 g via INTRAVENOUS

## 2021-09-30 MED ORDER — HYDROMORPHONE HCL 1 MG/ML IJ SOLN
INTRAMUSCULAR | Status: AC
  Administered 2021-09-30: 0.5 mg
  Filled 2021-09-30: qty 0.5

## 2021-09-30 MED ORDER — FAMOTIDINE 20 MG PO TABS
ORAL_TABLET | ORAL | Status: AC
  Filled 2021-09-30: qty 2

## 2021-09-30 MED ORDER — HEPARIN SODIUM (PORCINE) 1000 UNIT/ML IJ SOLN
INTRAMUSCULAR | Status: DC | PRN
  Administered 2021-09-30: 5000 [IU] via INTRAVENOUS

## 2021-09-30 MED ORDER — LABETALOL HCL 5 MG/ML IV SOLN: 10.0000 mg | INTRAVENOUS | Status: DC | PRN

## 2021-09-30 MED ORDER — ASPIRIN EC 81 MG PO TBEC: 81.0000 mg | DELAYED_RELEASE_TABLET | Freq: Every day | ORAL | Status: DC

## 2021-09-30 MED ORDER — SODIUM CHLORIDE 0.9% FLUSH: 3.0000 mL | INTRAVENOUS | Status: DC | PRN

## 2021-09-30 MED ORDER — METHYLPREDNISOLONE SODIUM SUCC 125 MG IJ SOLR
INTRAMUSCULAR | Status: AC
  Filled 2021-09-30: qty 2

## 2021-09-30 MED ORDER — MIDAZOLAM HCL 5 MG/5ML IJ SOLN
INTRAMUSCULAR | Status: AC
  Filled 2021-09-30: qty 5

## 2021-09-30 MED ORDER — FENTANYL CITRATE (PF) 100 MCG/2ML IJ SOLN
INTRAMUSCULAR | Status: DC | PRN
  Administered 2021-09-30: 50 ug via INTRAVENOUS
  Administered 2021-09-30 (×2): 25 ug via INTRAVENOUS

## 2021-09-30 MED ORDER — ASPIRIN EC 81 MG PO TBEC: 81.0000 mg | DELAYED_RELEASE_TABLET | Freq: Every day | ORAL | 2 refills | Status: DC

## 2021-09-30 MED ORDER — SODIUM CHLORIDE 0.9 % IV SOLN: 250.0000 mL | INTRAVENOUS | Status: DC | PRN

## 2021-09-30 MED ORDER — RIVAROXABAN 20 MG PO TABS: 20.0000 mg | ORAL_TABLET | Freq: Every day | ORAL | 5 refills | Status: DC

## 2021-09-30 MED ORDER — HYDROCODONE-ACETAMINOPHEN 5-325 MG PO TABS
ORAL_TABLET | ORAL | Status: AC
  Filled 2021-09-30: qty 1

## 2021-09-30 MED ORDER — HYDROMORPHONE HCL 1 MG/ML IJ SOLN
INTRAMUSCULAR | Status: AC
  Filled 2021-09-30: qty 0.5

## 2021-09-30 MED ORDER — HYDRALAZINE HCL 20 MG/ML IJ SOLN: 5.0000 mg | INTRAMUSCULAR | Status: DC | PRN

## 2021-09-30 MED ORDER — ACETAMINOPHEN 325 MG PO TABS: 650.0000 mg | ORAL_TABLET | ORAL | Status: DC | PRN

## 2021-09-30 MED ORDER — DIPHENHYDRAMINE HCL 50 MG/ML IJ SOLN
50.0000 mg | Freq: Once | INTRAMUSCULAR | Status: AC | PRN
  Administered 2021-09-30: 50 mg via INTRAVENOUS

## 2021-09-30 MED ORDER — HYDROMORPHONE HCL 1 MG/ML IJ SOLN
1.0000 mg | Freq: Once | INTRAMUSCULAR | Status: AC | PRN
  Administered 2021-09-30: 0.5 mg via INTRAVENOUS

## 2021-09-30 MED ORDER — DIPHENHYDRAMINE HCL 50 MG/ML IJ SOLN
INTRAMUSCULAR | Status: AC
  Filled 2021-09-30: qty 1

## 2021-09-30 MED ORDER — METHYLPREDNISOLONE SODIUM SUCC 125 MG IJ SOLR
125.0000 mg | Freq: Once | INTRAMUSCULAR | Status: AC | PRN
  Administered 2021-09-30: 125 mg via INTRAVENOUS

## 2021-09-30 MED ORDER — ONDANSETRON HCL 4 MG/2ML IJ SOLN: 4.0000 mg | Freq: Four times a day (QID) | INTRAMUSCULAR | Status: DC | PRN

## 2021-09-30 MED ORDER — FAMOTIDINE 20 MG PO TABS
40.0000 mg | ORAL_TABLET | Freq: Once | ORAL | Status: AC | PRN
  Administered 2021-09-30: 40 mg via ORAL

## 2021-09-30 MED ORDER — ONDANSETRON HCL 4 MG/2ML IJ SOLN
INTRAMUSCULAR | Status: AC
  Filled 2021-09-30: qty 2

## 2021-09-30 MED ORDER — SODIUM CHLORIDE 0.9% FLUSH: 3.0000 mL | Freq: Two times a day (BID) | INTRAVENOUS | Status: DC

## 2021-09-30 SURGICAL SUPPLY — 20 items
BALLN LUTONIX 018 4X150X130 (BALLOONS) ×2
BALLN LUTONIX 018 5X100X130 (BALLOONS) ×2
BALLON DORADO 5X100X135 (BALLOONS) ×2
BALLOON DORADO 5X100X135 (BALLOONS) IMPLANT
BALLOON LUTONIX 018 4X150X130 (BALLOONS) IMPLANT
BALLOON LUTONIX 018 5X100X130 (BALLOONS) IMPLANT
CATH ANGIO 5F PIGTAIL 65CM (CATHETERS) ×1 IMPLANT
CATH VERT 5X100 (CATHETERS) ×1 IMPLANT
DEVICE STARCLOSE SE CLOSURE (Vascular Products) ×2 IMPLANT
DEVICE TORQUE .025-.038 (MISCELLANEOUS) ×1 IMPLANT
GLIDEWIRE ADV .035X260CM (WIRE) ×1 IMPLANT
KIT ENCORE 26 ADVANTAGE (KITS) ×1 IMPLANT
PACK ANGIOGRAPHY (CUSTOM PROCEDURE TRAY) ×2 IMPLANT
SHEATH ANL2 6FRX45 HC (SHEATH) ×1 IMPLANT
SHEATH BRITE TIP 5FRX11 (SHEATH) ×2 IMPLANT
STENT VIABAHN 6X50X120 (Permanent Stent) ×1 IMPLANT
SYR MEDRAD MARK 7 150ML (SYRINGE) ×1 IMPLANT
TUBING CONTRAST HIGH PRESS 72 (TUBING) ×2 IMPLANT
WIRE G V18X300CM (WIRE) ×1 IMPLANT
WIRE GUIDERIGHT .035X150 (WIRE) ×1 IMPLANT

## 2021-09-30 NOTE — Op Note (Signed)
[facility]  Percutaneous Study/Intervention Procedural Note   Date of Surgery: 09/30/2021  Surgeon(s):,    Assistants:none  Pre-operative Diagnosis: PAD with claudication right lower extremity  Post-operative diagnosis:  Same  Procedure(s) Performed:             1.  Ultrasound guidance for vascular access left femoral artery             2.  Catheter placement into right common femoral artery from left femoral approach             3.  Aortogram and selective right lower extremity angiogram             4.  Percutaneous transluminal angioplasty of right tibioperoneal trunk and below-knee popliteal artery with 4 mm diameter Lutonix drug-coated angioplasty balloon             5.  Percutaneous transluminal angioplasty of the right above-knee popliteal artery with 5 mm diameter Lutonix drug-coated angioplasty balloon  6.  Viabahn stent placement to the above-knee popliteal artery with 6 mm diameter by 5 cm length stent for greater than 50% residual stenosis after angioplasty             7.  StarClose closure device left femoral artery  EBL: 10 cc  Contrast: 50 cc  Fluoro Time: 4.3 minutes  Moderate Conscious Sedation Time: approximately 49 minutes using 3 mg of Versed and 100 mcg of Fentanyl              Indications:  Patient is a 40 y.o.female with claudication symptoms. The patient has noninvasive study showing mildly reduced ABI on the right but dampened distal waveforms. The patient is brought in for angiography for further evaluation and potential treatment.  Risks and benefits are discussed and informed consent is obtained.   Procedure:  The patient was identified and appropriate procedural time out was performed.  The patient was then placed supine on the table and prepped and draped in the usual sterile fashion. Moderate conscious sedation was administered during a face to face encounter with the patient throughout the procedure with my supervision  of the RN administering medicines and monitoring the patient's vital signs, pulse oximetry, telemetry and mental status throughout from the start of the procedure until the patient was taken to the recovery room. Ultrasound was used to evaluate the left common femoral artery.  It was patent .  A digital ultrasound image was acquired.  A Seldinger needle was used to access the left common femoral artery under direct ultrasound guidance and a permanent image was performed.  A 0.035 J wire was advanced without resistance and a 5Fr sheath was placed.  Pigtail catheter was placed into the aorta and an AP aortogram was performed. The renal arteries are not well seen but this may have been due to catheter position.  Aorta and iliac arteries are widely patent. I then crossed the aortic bifurcation and advanced to the right femoral head. Selective right lower extremity angiogram was then performed. This demonstrated normal common femoral artery, profunda femoris artery, and superficial femoral artery.  The popliteal artery had an abrupt occlusion above the knee with a very large collateral that then filled all 3 tibial vessels.  Given her lack of atherosclerosis elsewhere, it was unclear if this was an embolic process that developed a chronic occlusion with robust collaterals or some sort of congenital or anatomic process, but it was felt that it was in the patient's best interest  to proceed with intervention after these images to avoid a second procedure and a larger amount of contrast and fluoroscopy based off of the findings from the initial angiogram. The patient was systemically heparinized and a 6 [place] Ansell sheath was then placed over the [employer] wire. I then used a Kumpe catheter and the advantage wire to cross the popliteal and tibioperoneal trunk occlusion without difficulty and confirm intraluminal flow in the peroneal artery.  And exchanged for a V 18 wire.  A 4 mm diameter by 15 cm length Lutonix  drug-coated angioplasty balloon was inflated in the tibioperoneal trunk and below-knee popliteal artery.  This was taken up to 10 atm for 1 minute.  A 5 mm diameter by 10 cm length Lutonix drug-coated angioplasty balloon was inflated to 12 atm for 1 minute in the above-knee popliteal artery.  Completion imaging showed the distal popliteal artery and tibioperoneal trunk to be widely patent with less than 10% residual stenosis, but the above-knee popliteal artery still had a greater than 50% stenosis.  This was far enough below the large collateral that we could place a Viabahn stent safely.  A 6 mm diameter by 5 cm length Viabahn stent was selected and deployed in the above-knee popliteal artery and postdilated with 5 mm balloon with excellent angiographic completion result and less than 10% residual stenosis. I elected to terminate the procedure. The sheath was removed and StarClose closure device was deployed in the left femoral artery with excellent hemostatic result. The patient was taken to the recovery room in stable condition having tolerated the procedure well.  Findings:               Aortogram: Renal arteries are not well seen but this may have been due to catheter position.  Aorta and iliac arteries are widely patent.             Right lower Extremity:  This demonstrated normal common femoral artery, profunda femoris artery, and superficial femoral artery.  The popliteal artery had an abrupt occlusion above the knee with a very large collateral that then filled all 3 tibial vessels.    Disposition: Patient was taken to the recovery room in stable condition having tolerated the procedure well.  Complications: None  Leotis Pain 09/30/2021 12:01 PM   This note was created with Dragon Medical transcription system. Any errors in dictation are purely unintentional.

## 2021-09-30 NOTE — Discharge Instructions (Signed)

## 2021-09-30 NOTE — Interval H&P Note (Signed)
History and Physical Interval Note:  09/30/2021 10:43 AM  Kristina Li  has presented today for surgery, with the diagnosis of RLE Angio   ASO w claudication.  The various methods of treatment have been discussed with the patient and family. After consideration of risks, benefits and other options for treatment, the patient has consented to  Procedure(s): LOWER EXTREMITY ANGIOGRAPHY (Right) as a surgical intervention.  The patient's history has been reviewed, patient examined, no change in status, stable for surgery.  I have reviewed the patient's chart and labs.  Questions were answered to the patient's satisfaction.     Leotis Pain

## 2021-10-01 ENCOUNTER — Encounter: Payer: Self-pay | Admitting: Vascular Surgery

## 2021-10-02 ENCOUNTER — Other Ambulatory Visit (INDEPENDENT_AMBULATORY_CARE_PROVIDER_SITE_OTHER): Payer: Self-pay | Admitting: Nurse Practitioner

## 2021-10-02 ENCOUNTER — Telehealth (INDEPENDENT_AMBULATORY_CARE_PROVIDER_SITE_OTHER): Payer: Self-pay

## 2021-10-02 MED ORDER — HYDROCODONE-ACETAMINOPHEN 5-325 MG PO TABS: 1.0000 | ORAL_TABLET | Freq: Four times a day (QID) | ORAL | 0 refills | Status: DC | PRN

## 2021-10-02 NOTE — Telephone Encounter (Signed)
Pt called and left a VM on the nurses line saying that she had surgery Monday .(Lower extremity angio with Dr. Lucky Cowboy) the pt says she is having pain in her leg ans had been talk [facility] powder an would like to know if she could have an Rx for pain medication.

## 2021-10-02 NOTE — Telephone Encounter (Signed)
Pain meds sent in to [facility]

## 2021-10-03 NOTE — Telephone Encounter (Signed)
I called an spoke with the pt and made her aware that the NP called in her RX.

## 2021-10-08 ENCOUNTER — Other Ambulatory Visit (INDEPENDENT_AMBULATORY_CARE_PROVIDER_SITE_OTHER): Payer: Self-pay | Admitting: Nurse Practitioner

## 2021-10-08 ENCOUNTER — Telehealth (INDEPENDENT_AMBULATORY_CARE_PROVIDER_SITE_OTHER): Payer: Self-pay

## 2021-10-08 DIAGNOSIS — I872 Venous insufficiency (chronic) (peripheral): Secondary | ICD-10-CM

## 2021-10-08 MED ORDER — HYDROCODONE-ACETAMINOPHEN 5-325 MG PO TABS: 1.0000 | ORAL_TABLET | Freq: Four times a day (QID) | ORAL | 0 refills | Status: DC | PRN

## 2021-10-08 NOTE — Telephone Encounter (Signed)
Patient called in stating that her right leg behind the knee and going down the leg is causing her pain. Patient stated she is wearing compression hose and elevating her leg. Patient stated she also wanted pain medication as well. Patient had a right leg angio with Dr. Lucky Cowboy on 09/30/21.

## 2021-10-09 NOTE — Telephone Encounter (Signed)
Is this ok to refill?  

## 2021-10-09 NOTE — Telephone Encounter (Signed)
Refill sent.  Sometimes the pain is related to the stretching of the artery and that can persist some.  If she still has pain following this refill, we will need to bring her in with studies (ABI) before we can send in any refills

## 2021-10-09 NOTE — Telephone Encounter (Signed)
Spoke with the patient and gave her the recommendations from Eulogio Ditch NP. Patient was also advised to move more ( walking). Patient understood. See notes below.

## 2021-10-14 ENCOUNTER — Telehealth (INDEPENDENT_AMBULATORY_CARE_PROVIDER_SITE_OTHER): Payer: Self-pay

## 2021-10-14 NOTE — Telephone Encounter (Signed)
The pt called and left a VM on the nurses line wanting to know is the pain she is having behind the knee normal and how long should her healing process be. She had a procedure on 10/24 a LE angio. Please advise.

## 2021-10-14 NOTE — Telephone Encounter (Signed)
She can come in for a LE art duplex on that leg, everyone's healing process is different and it partially depends on the patient.  She can come in to see if there is anything abnormal

## 2021-10-15 NOTE — Telephone Encounter (Signed)
Please call an schedule the pt for LE duplex

## 2021-10-16 ENCOUNTER — Telehealth (INDEPENDENT_AMBULATORY_CARE_PROVIDER_SITE_OTHER): Payer: Self-pay

## 2021-10-16 NOTE — Telephone Encounter (Signed)
Patient called in asking about where her stent was placed when she had a lower extremity with Dr. Lucky Cowboy on 09/30/21 because she felt something moving behind her leg. Patient was advised that she had a above knee stent placement and she should not have anything moving behind her leg.

## 2021-10-23 ENCOUNTER — Telehealth (INDEPENDENT_AMBULATORY_CARE_PROVIDER_SITE_OTHER): Payer: Self-pay | Admitting: Vascular Surgery

## 2021-10-23 NOTE — Telephone Encounter (Signed)
Documentation only.

## 2021-10-24 ENCOUNTER — Telehealth (INDEPENDENT_AMBULATORY_CARE_PROVIDER_SITE_OTHER): Payer: Self-pay

## 2021-10-24 NOTE — Telephone Encounter (Signed)
The stent is above her knee.  She shouldn't have pain at this continued point.  Let's have her come in for ABIs

## 2021-10-24 NOTE — Telephone Encounter (Signed)
The pt called and wanted to know where exactly her stent was placed , due to her still having pain and aching. The pt would also like to know is this normal this far out. Please advise.

## 2021-10-28 NOTE — Telephone Encounter (Signed)
LVM for pt TCB and schedule ABI and follow up with FB. She is scheduled for next week but if we have anything sooner.

## 2021-10-28 NOTE — Telephone Encounter (Signed)
Please call an schedule the pt for ABI's

## 2021-11-04 ENCOUNTER — Other Ambulatory Visit (INDEPENDENT_AMBULATORY_CARE_PROVIDER_SITE_OTHER): Payer: Self-pay | Admitting: Vascular Surgery

## 2021-11-04 DIAGNOSIS — Z95820 Peripheral vascular angioplasty status with implants and grafts: Secondary | ICD-10-CM

## 2021-11-04 DIAGNOSIS — I70211 Atherosclerosis of native arteries of extremities with intermittent claudication, right leg: Secondary | ICD-10-CM

## 2021-11-05 ENCOUNTER — Ambulatory Visit (INDEPENDENT_AMBULATORY_CARE_PROVIDER_SITE_OTHER): Payer: Medicaid Other | Admitting: Vascular Surgery

## 2021-11-05 ENCOUNTER — Encounter (INDEPENDENT_AMBULATORY_CARE_PROVIDER_SITE_OTHER): Payer: Medicaid Other

## 2021-12-05 ENCOUNTER — Ambulatory Visit (INDEPENDENT_AMBULATORY_CARE_PROVIDER_SITE_OTHER): Payer: Medicaid Other | Admitting: Gastroenterology

## 2021-12-05 ENCOUNTER — Other Ambulatory Visit: Payer: Self-pay

## 2021-12-05 ENCOUNTER — Encounter: Payer: Self-pay | Admitting: Gastroenterology

## 2021-12-05 VITALS — BP 111/71 | HR 96 | Temp 97.5°F | Ht 66.5 in | Wt 231.4 lb

## 2021-12-05 DIAGNOSIS — R1319 Other dysphagia: Secondary | ICD-10-CM | POA: Diagnosis not present
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
## 2021-12-06 ENCOUNTER — Other Ambulatory Visit: Payer: Self-pay

## 2021-12-06 DIAGNOSIS — R1319 Other dysphagia: Secondary | ICD-10-CM

## 2021-12-10 ENCOUNTER — Other Ambulatory Visit: Payer: Self-pay

## 2021-12-10 ENCOUNTER — Encounter: Payer: Self-pay | Admitting: Gastroenterology

## 2021-12-10 ENCOUNTER — Telehealth: Payer: Self-pay

## 2021-12-10 NOTE — Telephone Encounter (Signed)
-----   Message from Algernon Huxley, MD sent at 12/10/2021 10:08 AM EST ----- Can stop two days prior to procedure and resume the day after the procedure ----- Message ----- From: Glennie Isle, CMA Sent: 12/10/2021   9:05 AM EST To: Algernon Huxley, MD

## 2021-12-10 NOTE — Telephone Encounter (Signed)
Pt notified of Dr. Bunnie Domino recommendation for blood thinner.

## 2021-12-12 ENCOUNTER — Ambulatory Visit: Payer: Medicaid Other | Admitting: Anesthesiology

## 2021-12-12 ENCOUNTER — Ambulatory Visit
Admission: RE | Admit: 2021-12-12 | Discharge: 2021-12-12 | Disposition: A | Discharge status: Home / Self Care | Payer: Medicaid Other | Source: Ambulatory Visit | Attending: Gastroenterology | Admitting: Gastroenterology

## 2021-12-12 ENCOUNTER — Other Ambulatory Visit: Payer: Self-pay

## 2021-12-12 ENCOUNTER — Encounter
Admission: RE | Disposition: A | Discharge status: Home / Self Care | Payer: Self-pay | Source: Ambulatory Visit | Attending: Gastroenterology

## 2021-12-12 ENCOUNTER — Encounter: Payer: Self-pay | Admitting: Gastroenterology

## 2021-12-12 DIAGNOSIS — I10 Essential (primary) hypertension: Secondary | ICD-10-CM | POA: Diagnosis not present

## 2021-12-12 DIAGNOSIS — R1319 Other dysphagia: Secondary | ICD-10-CM

## 2021-12-12 DIAGNOSIS — K219 Gastro-esophageal reflux disease without esophagitis: Secondary | ICD-10-CM | POA: Insufficient documentation

## 2021-12-12 DIAGNOSIS — E039 Hypothyroidism, unspecified: Secondary | ICD-10-CM | POA: Diagnosis not present

## 2021-12-12 DIAGNOSIS — R1310 Dysphagia, unspecified: Secondary | ICD-10-CM | POA: Insufficient documentation

## 2021-12-12 DIAGNOSIS — J449 Chronic obstructive pulmonary disease, unspecified: Secondary | ICD-10-CM | POA: Diagnosis not present

## 2021-12-12 HISTORY — PX: ESOPHAGEAL DILATION: SHX303

## 2021-12-12 HISTORY — PX: ESOPHAGOGASTRODUODENOSCOPY (EGD) WITH PROPOFOL: SHX5813

## 2021-12-12 LAB — POCT PREGNANCY, URINE: Preg Test, Ur: NEGATIVE

## 2021-12-12 SURGERY — ESOPHAGOGASTRODUODENOSCOPY (EGD) WITH PROPOFOL
Anesthesia: General | Site: Throat

## 2021-12-12 MED ORDER — PROPOFOL 10 MG/ML IV BOLUS
INTRAVENOUS | Status: DC | PRN
  Administered 2021-12-12: 40 mg via INTRAVENOUS
  Administered 2021-12-12: 180 mg via INTRAVENOUS
  Administered 2021-12-12: 30 mg via INTRAVENOUS
  Administered 2021-12-12: 20 mg via INTRAVENOUS
  Administered 2021-12-12: 40 mg via INTRAVENOUS

## 2021-12-12 MED ORDER — ACETAMINOPHEN 325 MG PO TABS: 325.0000 mg | ORAL_TABLET | Freq: Once | ORAL | Status: DC

## 2021-12-12 MED ORDER — SODIUM CHLORIDE 0.9 % IV SOLN: INTRAVENOUS | Status: DC

## 2021-12-12 MED ORDER — LACTATED RINGERS IV SOLN: INTRAVENOUS | Status: DC

## 2021-12-12 MED ORDER — STERILE WATER FOR IRRIGATION IR SOLN
Status: DC | PRN
  Administered 2021-12-12: 250 mL

## 2021-12-12 MED ORDER — GLYCOPYRROLATE 0.2 MG/ML IJ SOLN
INTRAMUSCULAR | Status: DC | PRN
  Administered 2021-12-12: .1 mg via INTRAVENOUS

## 2021-12-12 MED ORDER — ACETAMINOPHEN 160 MG/5ML PO SOLN: 325.0000 mg | Freq: Once | ORAL | Status: DC

## 2021-12-12 MED ORDER — LIDOCAINE HCL (CARDIAC) PF 100 MG/5ML IV SOSY
PREFILLED_SYRINGE | INTRAVENOUS | Status: DC | PRN
  Administered 2021-12-12: 50 mg via INTRAVENOUS

## 2021-12-12 SURGICAL SUPPLY — 11 items
BALLN DILATOR 15-18 8 (BALLOONS) ×3
BALLOON DILATOR 15-18 8 (BALLOONS) IMPLANT
BLOCK BITE 60FR ADLT L/F GRN (MISCELLANEOUS) ×3 IMPLANT
FORCEPS BIOP RAD 4 LRG CAP 4 (CUTTING FORCEPS) ×1 IMPLANT
GOWN CVR UNV OPN BCK APRN NK (MISCELLANEOUS) ×4 IMPLANT
GOWN ISOL THUMB LOOP REG UNIV (MISCELLANEOUS) ×6
KIT PRC NS LF DISP ENDO (KITS) ×2 IMPLANT
KIT PROCEDURE OLYMPUS (KITS) ×3
MANIFOLD NEPTUNE II (INSTRUMENTS) ×3 IMPLANT
SYR INFLATION 60ML (SYRINGE) ×1 IMPLANT
WATER STERILE IRR 250ML POUR (IV SOLUTION) ×3 IMPLANT
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
## 2021-12-12 NOTE — Op Note (Addendum)
[facility] Gastroenterology Patient Name: Kristina Li Procedure Date: 12/12/2021 11:01 AM MRN: [id number] Account #: [id number] Date of Birth: [birth year] Admit Type: Outpatient Age: 41 Room: [facility] OR ROOM 01 Gender: Female Note Status: Finalized Instrument Name: [id number] Procedure:             Upper GI endoscopy Indications:           Dysphagia Providers:             [name] MD, MD Referring MD:          Ivin Poot. Matthew Saras (Referring MD) Medicines:             Propofol per Anesthesia Complications:         No immediate complications. Procedure:             Pre-Anesthesia Assessment:                        - Prior to the procedure, a History and Physical was                         performed, and patient medications and allergies were                         reviewed. The patient's tolerance of previous                         anesthesia was also reviewed. The risks and benefits                         of the procedure and the sedation options and risks                         were discussed with the patient. All questions were                         answered, and informed consent was obtained. Prior                         Anticoagulants: The patient has taken no previous                         anticoagulant or antiplatelet agents. ASA Grade                         Assessment: II - A patient with mild systemic disease.                         After reviewing the risks and benefits, the patient                         was deemed in satisfactory condition to undergo the                         procedure.                        After obtaining informed consent, the endoscope was  passed under direct vision. Throughout the procedure,                         the patient's blood pressure, pulse, and oxygen                         saturations were monitored continuously. The Endoscope                         was introduced through the  mouth, and advanced to the                         second part of duodenum. The upper GI endoscopy was                         accomplished without difficulty. The patient tolerated                         the procedure well. Findings:      The examined esophagus was normal. A TTS dilator was passed through the       scope. Dilation with a 15-16.5-18 mm balloon dilator was performed to 18       mm. The dilation site was examined and showed complete resolution of       luminal narrowing.      The stomach was normal.      The examined duodenum was normal.      Biopsies were taken with a cold forceps in the mid esophagus for       histology. Impression:            - Normal esophagus. Dilated.                        - Normal stomach.                        - Normal examined duodenum.                        - Biopsies were taken with a cold forceps for                         histology in the mid esophagus. Recommendation:        - Discharge patient to home.                        - Resume previous diet.                        - Continue present medications. Procedure Code(s):     --- Professional ---                        [phone], Esophagogastroduodenoscopy, flexible,                         transoral; with transendoscopic balloon dilation of                         esophagus (less than 30 mm diameter)  43239, 59, Esophagogastroduodenoscopy, flexible,                         transoral; with biopsy, single or multiple Diagnosis Code(s):     --- Professional ---                        R13.10, Dysphagia, unspecified CPT copyright 2019 American Medical Association. All rights reserved. The codes documented in this report are preliminary and upon coder review may  be revised to meet current compliance requirements. [name] MD, MD 12/12/2021 11:23:14 AM This report has been signed electronically. Number of Addenda: 0 Note Initiated On: 12/12/2021 11:01 AM Total  Procedure Duration: 0 hours 4 minutes 11 seconds  Estimated Blood Loss:  Estimated blood loss: none.      [facility]

## 2021-12-12 NOTE — Transfer of Care (Signed)
Immediate Anesthesia Transfer of Care Note  Patient: Kristina Li  Procedure(s) Performed: ESOPHAGOGASTRODUODENOSCOPY (EGD) WITH PROPOFOL (Throat) ESOPHAGEAL DILATION (Throat)  Patient Location: PACU  Anesthesia Type: General  Level of Consciousness: awake, alert  and patient cooperative  Airway and Oxygen Therapy: Patient Spontanous Breathing and Patient connected to supplemental oxygen  Post-op Assessment: Post-op Vital signs reviewed, Patient's Cardiovascular Status Stable, Respiratory Function Stable, Patent Airway and No signs of Nausea or vomiting  Post-op Vital Signs: Reviewed and stable  Complications: No notable events documented.

## 2021-12-12 NOTE — Anesthesia Postprocedure Evaluation (Signed)
Anesthesia Post Note  Patient: Kristina Li  Procedure(s) Performed: ESOPHAGOGASTRODUODENOSCOPY (EGD) WITH PROPOFOL (Throat) ESOPHAGEAL DILATION (Throat)     Patient location during evaluation: PACU Anesthesia Type: General Level of consciousness: awake and alert and oriented Pain management: satisfactory to patient Vital Signs Assessment: post-procedure vital signs reviewed and stable Respiratory status: spontaneous breathing, nonlabored ventilation and respiratory function stable Cardiovascular status: blood pressure returned to baseline and stable Postop Assessment: Adequate PO intake and No signs of nausea or vomiting Anesthetic complications: no   No notable events documented.  Raliegh Ip

## 2021-12-12 NOTE — Anesthesia Preprocedure Evaluation (Addendum)
Anesthesia Evaluation  Patient identified by MRN, date of birth, ID band Patient awake    Reviewed: Allergy & Precautions, H&P , NPO status , Patient's Chart, lab work & pertinent test results  Airway Mallampati: II  TM Distance: >3 FB Neck ROM: full    Dental no notable dental hx.    Pulmonary asthma , COPD,    Pulmonary exam normal breath sounds clear to auscultation       Cardiovascular hypertension, + Peripheral Vascular Disease  Normal cardiovascular exam+ Valvular Problems/Murmurs  Rhythm:regular Rate:Normal     Neuro/Psych PSYCHIATRIC DISORDERS Anxiety  Neuromuscular disease    GI/Hepatic GERD  ,  Endo/Other  Hypothyroidism Morbid obesity  Renal/GU      Musculoskeletal   Abdominal   Peds  Hematology   Anesthesia Other Findings   Reproductive/Obstetrics                            Anesthesia Physical Anesthesia Plan  ASA: 3  Anesthesia Plan: General   Post-op Pain Management: Minimal or no pain anticipated   Induction: Intravenous  PONV Risk Score and Plan: 3 and Treatment may vary due to age or medical condition, Propofol infusion and TIVA  Airway Management Planned: Natural Airway  Additional Equipment:   Intra-op Plan:   Post-operative Plan:   Informed Consent: I have reviewed the patients History and Physical, chart, labs and discussed the procedure including the risks, benefits and alternatives for the proposed anesthesia with the patient or authorized representative who has indicated his/her understanding and acceptance.     Dental Advisory Given  Plan Discussed with: CRNA  Anesthesia Plan Comments:         Anesthesia Quick Evaluation

## 2021-12-12 NOTE — Anesthesia Procedure Notes (Signed)
Date/Time: 12/12/2021 11:11 AM Performed by: Cameron Ali, CRNA Pre-anesthesia Checklist: Patient identified, Emergency Drugs available, Suction available, Timeout performed and Patient being monitored Patient Re-evaluated:Patient Re-evaluated prior to induction Oxygen Delivery Method: Nasal cannula Placement Confirmation: positive ETCO2

## 2021-12-13 ENCOUNTER — Encounter: Payer: Self-pay | Admitting: Gastroenterology

## 2021-12-13 LAB — SURGICAL PATHOLOGY

## 2021-12-25 ENCOUNTER — Encounter: Payer: Medicaid Other | Attending: Nurse Practitioner | Admitting: *Deleted

## 2021-12-25 ENCOUNTER — Other Ambulatory Visit: Payer: Self-pay

## 2021-12-25 ENCOUNTER — Encounter: Payer: Self-pay | Admitting: *Deleted

## 2021-12-25 VITALS — BP 100/70 | Ht 66.5 in | Wt 227.7 lb

## 2021-12-25 DIAGNOSIS — E119 Type 2 diabetes mellitus without complications: Secondary | ICD-10-CM | POA: Diagnosis present

## 2021-12-25 NOTE — Patient Instructions (Addendum)
Check blood sugars 1 x day before breakfast or 2 hrs after supper every day Bring blood sugar records to the next class  Call your doctor for a prescription for:  1. Meter strips (type) Accu-Chek Guide    checking  1   time per day  2. Lancets (type) Accu-Chek Softclix  checking  1   time per day  Exercise:  Continue walking  for    20  minutes   4  days a week and gradually increase to 30 minutes 5 x week  Eat 3 meals day,   1-2  snacks a day Space meals 4-6 hours apart Allow 2-3 hours between meals and snacks Avoid sugar sweetened drinks (sports drinks, juices) Limit desserts/sweets  Make an eye doctor appointment  Return for classes on:

## 2021-12-26 NOTE — Progress Notes (Signed)
Diabetes Self-Management Education  Visit Type: First/Initial  Appt. Start Time: 1030 Appt. End Time: 7672  12/25/2021  [name], identified by name and date of birth, is a 41 y.o. female with a diagnosis of Diabetes: Type 2.   ASSESSMENT  Blood pressure 100/70, height 5' 6.5" (1.689 m), weight 227 lb 11.2 oz (103.3 kg). Body mass index is 36.2 kg/m.   Diabetes Self-Management Education - 12/25/21 1242       Visit Information   Visit Type First/Initial      Initial Visit   Diabetes Type Type 2    Are you currently following a meal plan? No    Are you taking your medications as prescribed? Yes    Date Diagnosed "few months"      Health Coping   How would you rate your overall health? Fair      Psychosocial Assessment   Patient Belief/Attitude about Diabetes Other (comment)   "sad"   Self-care barriers None    Self-management support Doctor's office;Family    Patient Concerns Nutrition/Meal planning;Glycemic Control;Monitoring;Healthy Lifestyle    Special Needs None    Preferred Learning Style Auditory;Hands on    Learning Readiness Ready    How often do you need to have someone help you when you read instructions, pamphlets, or other written materials from your doctor or pharmacy? 1 - Never    What is the last grade level you completed in school? 12th      Pre-Education Assessment   Patient understands the diabetes disease and treatment process. Needs Instruction    Patient understands incorporating nutritional management into lifestyle. Needs Instruction    Patient undertands incorporating physical activity into lifestyle. Needs Instruction    Patient understands using medications safely. Needs Instruction    Patient understands monitoring blood glucose, interpreting and using results Needs Instruction    Patient understands prevention, detection, and treatment of acute complications. Needs Instruction    Patient understands prevention, detection, and  treatment of chronic complications. Needs Instruction    Patient understands how to develop strategies to address psychosocial issues. Needs Instruction    Patient understands how to develop strategies to promote health/change behavior. Needs Instruction      Complications   Last HgB A1C per patient/outside source 10.4 %   12/03/2021   How often do you check your blood sugar? 0 times/day (not testing)   Provided an Accu-chek Guide Me meter and instructed on use. BG upon return demonstration was 87 mg/dL at 11:40 am - 3 1/2 hrs pp.   Have you had a dilated eye exam in the past 12 months? No    Have you had a dental exam in the past 12 months? No    Are you checking your feet? Yes    How many days per week are you checking your feet? 7      Dietary Intake   Breakfast eggs, bacon or beef sausage, hashbrown; cereal and milk; waffles    Snack (morning) fruit (peaches, pears, cherries, apple, plums; Greek yogurt    Lunch chicken, fries or potatoes, peas, green beans; left overs from supper    Snack (afternoon) same as morning snack    Dinner chicken, beef, tuna, salmon; sweet potatoes, corn, peas, beans, rice, occasional pasta, cabbage, squash; salads with lettuce, onions, cuccumbers, tomatoes; ice cream    Beverage(s) water, fruit juice, sports drinks      Exercise   Exercise Type Light (walking / raking leaves)    How many  days per week to you exercise? 4    How many minutes per day do you exercise? 20    Total minutes per week of exercise 80      Patient Education   Previous Diabetes Education No    Disease state  Definition of diabetes, type 1 and 2, and the diagnosis of diabetes;Factors that contribute to the development of diabetes    Nutrition management  Role of diet in the treatment of diabetes and the relationship between the three main macronutrients and blood glucose level;Food label reading, portion sizes and measuring food.;Reviewed blood glucose goals for pre and post meals and  how to evaluate the patients' food intake on their blood glucose level.;Meal timing in regards to the patients' current diabetes medication.    Physical activity and exercise  Role of exercise on diabetes management, blood pressure control and cardiac health.    Medications Reviewed patients medication for diabetes, action, purpose, timing of dose and side effects.    Monitoring Taught/evaluated SMBG meter.;Purpose and frequency of SMBG.;Taught/discussed recording of test results and interpretation of SMBG.;Identified appropriate SMBG and/or A1C goals.    Chronic complications Relationship between chronic complications and blood glucose control    Psychosocial adjustment Identified and addressed patients feelings and concerns about diabetes      Individualized Goals (developed by patient)   Reducing Risk Other (comment)   improve blood sugars, prevent diabetes complications, lead a heatlhier lifestyle, become more fit     Outcomes   Expected Outcomes Demonstrated interest in learning. Expect positive outcomes    Future DMSE 2 wks        Individualized Plan for Diabetes Self-Management Training:   Learning Objective:  Patient will have a greater understanding of diabetes self-management. Patient education plan is to attend individual and/or group sessions per assessed needs and concerns.   Plan:   Patient Instructions  Check blood sugars 1 x day before breakfast or 2 hrs after supper every day Bring blood sugar records to the next class  Call your doctor for a prescription for:  1. Meter strips (type) Accu-Chek Guide    checking  1   time per day  2. Lancets (type) Accu-Chek Softclix  checking  1   time per day  Exercise:  Continue walking  for    20  minutes   4  days a week and gradually increase to 30 minutes 5 x week  Eat 3 meals day,   1-2  snacks a day Space meals 4-6 hours apart Allow 2-3 hours between meals and snacks Avoid sugar sweetened drinks (sports drinks,  juices) Limit desserts/sweets  Make an eye doctor appointment  Expected Outcomes:  Demonstrated interest in learning. Expect positive outcomes  Education material provided:  General Meal Planning Guidelines Simple Meal Plan Meter = Accu-Chek Guide Me  If problems or questions, patient to contact team via:   Johny Drilling, RN, [place] [phone]  Future DSME appointment: 2 wks January 09, 2022 for Diabetes Class 1

## 2022-01-09 ENCOUNTER — Ambulatory Visit: Payer: Medicaid Other

## 2022-01-16 ENCOUNTER — Ambulatory Visit: Payer: Medicaid Other

## 2022-01-23 ENCOUNTER — Ambulatory Visit: Payer: Medicaid Other

## 2022-01-28 ENCOUNTER — Encounter: Payer: Self-pay | Admitting: *Deleted

## 2022-03-06 ENCOUNTER — Other Ambulatory Visit (INDEPENDENT_AMBULATORY_CARE_PROVIDER_SITE_OTHER): Payer: Self-pay | Admitting: Vascular Surgery

## 2022-06-09 DIAGNOSIS — M2550 Pain in unspecified joint: Secondary | ICD-10-CM | POA: Insufficient documentation

## 2022-09-08 ENCOUNTER — Other Ambulatory Visit: Payer: Self-pay

## 2022-09-08 ENCOUNTER — Emergency Department: Payer: Medicaid Other

## 2022-09-08 ENCOUNTER — Encounter: Payer: Self-pay | Admitting: Emergency Medicine

## 2022-09-08 ENCOUNTER — Emergency Department
Admission: EM | Admit: 2022-09-08 | Discharge: 2022-09-08 | Disposition: A | Discharge status: Home / Self Care | Payer: Medicaid Other | Attending: Emergency Medicine | Admitting: Emergency Medicine

## 2022-09-08 DIAGNOSIS — N83202 Unspecified ovarian cyst, left side: Secondary | ICD-10-CM | POA: Diagnosis not present

## 2022-09-08 DIAGNOSIS — N939 Abnormal uterine and vaginal bleeding, unspecified: Secondary | ICD-10-CM

## 2022-09-08 DIAGNOSIS — R7989 Other specified abnormal findings of blood chemistry: Secondary | ICD-10-CM | POA: Diagnosis not present

## 2022-09-08 DIAGNOSIS — N83201 Unspecified ovarian cyst, right side: Secondary | ICD-10-CM | POA: Insufficient documentation

## 2022-09-08 DIAGNOSIS — Z7901 Long term (current) use of anticoagulants: Secondary | ICD-10-CM | POA: Diagnosis not present

## 2022-09-08 LAB — URINALYSIS, ROUTINE W REFLEX MICROSCOPIC
Bilirubin Urine: NEGATIVE
Glucose, UA: >=500 mg/dL — AB
Ketones, ur: NEGATIVE mg/dL
Leukocytes,Ua: NEGATIVE
Nitrite: NEGATIVE
Protein, ur: NEGATIVE mg/dL
Specific Gravity, Urine: 1.016 (ref 1.005–1.030)
pH: 6 (ref 5.0–8.0)

## 2022-09-08 LAB — CBC
HCT: 40.9 % (ref 36.0–46.0)
Hemoglobin: 12.8 g/dL (ref 12.0–15.0)
MCH: 27.7 pg (ref 26.0–34.0)
MCHC: 31.3 g/dL (ref 30.0–36.0)
MCV: 88.5 fL (ref 80.0–100.0)
Platelets: 260 10*3/uL (ref 150–400)
RBC: 4.62 MIL/uL (ref 3.87–5.11)
RDW: 14.4 % (ref 11.5–15.5)
WBC: 6.3 10*3/uL (ref 4.0–10.5)
nRBC: 0 % (ref 0.0–0.2)

## 2022-09-08 LAB — COMPREHENSIVE METABOLIC PANEL
ALT: 43 U/L (ref 0–44)
AST: 35 U/L (ref 15–41)
Albumin: 4 g/dL (ref 3.5–5.0)
Alkaline Phosphatase: 88 U/L (ref 38–126)
Anion gap: 7 (ref 5–15)
BUN: 9 mg/dL (ref 6–20)
CO2: 24 mmol/L (ref 22–32)
Calcium: 9 mg/dL (ref 8.9–10.3)
Chloride: 108 mmol/L (ref 98–111)
Creatinine, Ser: 1.2 mg/dL — ABNORMAL HIGH (ref 0.44–1.00)
GFR, Estimated: 58 mL/min — ABNORMAL LOW (ref >60)
Glucose, Bld: 96 mg/dL (ref 70–99)
Potassium: 3.8 mmol/L (ref 3.5–5.1)
Sodium: 139 mmol/L (ref 135–145)
Total Bilirubin: 0.3 mg/dL (ref 0.3–1.2)
Total Protein: 7.3 g/dL (ref 6.5–8.1)

## 2022-09-08 LAB — POC URINE PREG, ED: Preg Test, Ur: NEGATIVE

## 2022-09-08 LAB — LIPASE, BLOOD: Lipase: 33 U/L (ref 11–51)

## 2022-09-08 NOTE — ED Triage Notes (Signed)
C/O RLQ abdominal pain x 3 days.  Also c/o vaginal bleeding x 3 days, a little heavier.  TAkes Slynd for birth control x 3 years.

## 2022-09-08 NOTE — Discharge Instructions (Addendum)
Your ultrasound today was overall reassuring but you should call the OB/GYN to make a follow-up appointment.  Given you are on a blood thinner have low threshold to return to the ER for repeat hemoglobin check but your hemoglobin today was normal.  If you develop more than a pad an hour of bleeding, lightheadedness, weakness or any other concerns please return and we can recheck your hemoglobin here or follow-up with your primary care doctor otherwise continue taking your birth control and monitoring this.   If you develop fevers, increasing pain, vomiting you can always return as well for a CT imaging to rule out appendicitis but at this time I have lower suspicion for this

## 2022-09-08 NOTE — ED Provider Notes (Signed)
[facility] Provider Note    Event Date/Time   First MD Initiated Contact with Patient 09/08/22 1618     (approximate)   History   Abdominal Pain   HPI  Kristina Li is a 41 y.o. female with history of being on Xarelto for peripheral artery disease, diabetes who comes in with vaginal bleeding.  Patient reports that she currently does not have an OB/GYN.  She reports having 3 days of some right lower quadrant pain but currently pain is very minimal to none but then she developed some vaginal bleeding that started yesterday.  She reports going through 4-6 pads in the past 24 hours.  Denies it being more than a pad an hour.  Denies any tampon use.  Denies any new sexual partners.  She reports being with 1 partner.  She reports that she has been on birth control for some years now that prevent her from having a period and so she was worried when she developed the period.  She does report history of anemia from periods which is why they have started her on medications to help prevent the periods.  She denies any fevers, vomiting or other concerns.  Physical Exam   Triage Vital Signs: ED Triage Vitals  Enc Vitals Group     BP 09/08/22 1616 114/65     Pulse Rate 09/08/22 1616 81     Resp 09/08/22 1616 15     Temp 09/08/22 1616 98.5 F (36.9 C)     Temp Source 09/08/22 1616 Oral     SpO2 09/08/22 1616 98 %     Weight 09/08/22 1520 227 lb 11.8 oz (103.3 kg)     Height 09/08/22 1520 5' 6.5" (1.689 m)     Head Circumference --      Peak Flow --      Pain Score 09/08/22 1520 7     Pain Loc --      Pain Edu? --      Excl. in [place]? --     Most recent vital signs: Vitals:   09/08/22 1616  BP: 114/65  Pulse: 81  Resp: 15  Temp: 98.5 F (36.9 C)  SpO2: 98%     General: Awake, no distress.  CV:  Good peripheral perfusion.  Resp:  Normal effort.  Abd:  No distention.  Soft and nontender Other:  Patient has some minimal amount of blood in the vault.   No cervical motion tenderness.  No adnexal tenderness.   ED Results / Procedures / Treatments   Labs (all labs ordered are listed, but only abnormal results are displayed) Labs Reviewed  COMPREHENSIVE METABOLIC PANEL - Abnormal; Notable for the following components:      Result Value   Creatinine, Ser 1.20 (*)    GFR, Estimated 58 (*)    All other components within normal limits  URINALYSIS, ROUTINE W REFLEX MICROSCOPIC - Abnormal; Notable for the following components:   Color, Urine YELLOW (*)    APPearance HAZY (*)    Glucose, UA >=500 (*)    Hgb urine dipstick LARGE (*)    Bacteria, UA RARE (*)    All other components within normal limits  LIPASE, BLOOD  CBC  POC URINE PREG, ED     RADIOLOGY I have reviewed the ultrasound personally and interpreted and patient has some bilateral simple cyst  PROCEDURES:  Critical Care performed: No  Procedures   MEDICATIONS ORDERED IN ED: Medications - No data to  display   IMPRESSION / MDM / ASSESSMENT AND PLAN / ED COURSE  I reviewed the triage vital signs and the nursing notes.   Patient's presentation is most consistent with acute presentation with potential threat to life or bodily function.   Differential includes menstruation, pregnancy, ovarian torsion ovarian fibroid.  Will get ultrasound and labs to further evaluate  Pregnancy test was negative.  Lipase is normal CMP shows slightly elevated creatinine but similar to her prior.  CBC shows normal hemoglobin at 12.8.  Her ultrasound shows bilateral simple cyst  Repeat evaluation patient reports doing well.  Her abdomen is soft and nontender.  I considered the possibility of appendicitis given the right lower quadrant pain but her pain is more pelvic in nature she denies any fever and she does not currently have any pain at this time therefore we have discussed the possibility of this and have opted to hold off on CT imaging given lower suspicion but we discussed return  precautions in regards to this.  I suspect this is more likely related to the menstruation.  Patient does not have an OB/GYN to follow-up with so I will provide her with some names and we discussed given she is on a blood thinner to return for hemoglobin recheck if she develops worsening bleeding, weakness lightheadedness or any other concerns.  She expressed understanding and felt comfortable with this plan     FINAL CLINICAL IMPRESSION(S) / ED DIAGNOSES   Final diagnoses:  Abnormal vaginal bleeding     Rx / DC Orders   ED Discharge Orders     None        Note:  This document was prepared using Dragon voice recognition software and may include unintentional dictation errors.   Vanessa [place]in[place], MD 09/08/22 [phone]

## 2022-09-09 ENCOUNTER — Telehealth: Payer: Self-pay | Admitting: Emergency Medicine

## 2022-09-09 ENCOUNTER — Other Ambulatory Visit (INDEPENDENT_AMBULATORY_CARE_PROVIDER_SITE_OTHER): Payer: Self-pay | Admitting: Nurse Practitioner

## 2022-09-09 ENCOUNTER — Other Ambulatory Visit
Admission: RE | Admit: 2022-09-09 | Discharge: 2022-09-09 | Disposition: A | Discharge status: Home / Self Care | Payer: Medicaid Other | Source: Ambulatory Visit | Attending: Emergency Medicine | Admitting: Emergency Medicine

## 2022-09-09 DIAGNOSIS — N939 Abnormal uterine and vaginal bleeding, unspecified: Secondary | ICD-10-CM | POA: Insufficient documentation

## 2022-09-09 LAB — WET PREP, GENITAL
Clue Cells Wet Prep HPF POC: NONE SEEN
Sperm: NONE SEEN
Trich, Wet Prep: NONE SEEN
WBC, Wet Prep HPF POC: >=10 — AB (ref <10)
Yeast Wet Prep HPF POC: NONE SEEN

## 2022-09-09 LAB — CHLAMYDIA/NGC RT PCR (ARMC ONLY)
Chlamydia Tr: NOT DETECTED
N gonorrhoeae: NOT DETECTED

## 2022-09-09 NOTE — Telephone Encounter (Signed)
Note entered in error

## 2022-09-11 ENCOUNTER — Other Ambulatory Visit: Payer: Self-pay

## 2022-09-11 ENCOUNTER — Encounter: Payer: Self-pay | Admitting: *Deleted

## 2022-09-11 ENCOUNTER — Emergency Department
Admission: EM | Admit: 2022-09-11 | Discharge: 2022-09-11 | Disposition: A | Discharge status: Home / Self Care | Payer: Medicaid Other | Attending: Emergency Medicine | Admitting: Emergency Medicine

## 2022-09-11 DIAGNOSIS — K047 Periapical abscess without sinus: Secondary | ICD-10-CM | POA: Insufficient documentation

## 2022-09-11 DIAGNOSIS — K0889 Other specified disorders of teeth and supporting structures: Secondary | ICD-10-CM

## 2022-09-11 MED ORDER — HYDROCODONE-ACETAMINOPHEN 5-325 MG PO TABS: 1.0000 | ORAL_TABLET | ORAL | 0 refills | Status: DC | PRN

## 2022-09-11 MED ORDER — HYDROCODONE-ACETAMINOPHEN 5-325 MG PO TABS
1.0000 | ORAL_TABLET | Freq: Once | ORAL | Status: AC
  Administered 2022-09-11: 1 via ORAL
  Filled 2022-09-11: qty 1

## 2022-09-11 MED ORDER — LIDOCAINE-EPINEPHRINE 2 %-1:100000 IJ SOLN
1.7000 mL | Freq: Once | INTRAMUSCULAR | Status: AC
  Administered 2022-09-11: 1.7 mL
  Filled 2022-09-11: qty 1.7

## 2022-09-11 NOTE — ED Triage Notes (Signed)
Pt has left lower toothache since last night.  Pt taking otc meds without relief.  Pt alert  speech clear.

## 2022-09-11 NOTE — ED Provider Notes (Signed)
[facility] Provider Note  Patient Contact: 10:04 PM (approximate)   History   Dental Pain   HPI  Kristina Li is a 41 y.o. female who presents the emergency department complaining of left lower dental pain.  Patient saw her dentist, diagnosed with dental infection and placed on clindamycin.  She was given any medications for pain and states that the pain is a deep ache that she cannot get rid of with over-the-counter medication.  No fevers or chills, difficulty breathing or swallowing.     Physical Exam   Triage Vital Signs: ED Triage Vitals  Enc Vitals Group     BP 09/11/22 2029 122/72     Pulse Rate 09/11/22 2029 90     Resp 09/11/22 2029 18     Temp 09/11/22 2029 98.6 F (37 C)     Temp Source 09/11/22 2029 Oral     SpO2 09/11/22 2029 100 %     Weight 09/11/22 2029 220 lb (99.8 kg)     Height 09/11/22 2029 '5\' 6"'$  (1.676 m)     Head Circumference --      Peak Flow --      Pain Score 09/11/22 2031 10     Pain Loc --      Pain Edu? --      Excl. in [place]? --     Most recent vital signs: Vitals:   09/11/22 2029  BP: 122/72  Pulse: 90  Resp: 18  Temp: 98.6 F (37 C)  SpO2: 100%     General: Alert and in no acute distress. ENT:      Ears:       Nose: No congestion/rhinnorhea.      Mouth/Throat: Mucous membranes are moist.  Slight erythema around the left rear molar with what appears to be a fracture through the tooth.  No loose teeth.  No drainable fluid collection.  Uvula is midline. Hematological/Lymphatic/Immunilogical: No cervical lymphadenopathy. Cardiovascular:  Good peripheral perfusion Respiratory: Normal respiratory effort without tachypnea or retractions. Lungs CTAB.  Musculoskeletal: Full range of motion to all extremities.  Neurologic:  No gross focal neurologic deficits are appreciated.  Skin:   No rash noted Other:   ED Results / Procedures / Treatments   Labs (all labs ordered are listed, but only abnormal  results are displayed) Labs Reviewed - No data to display   EKG     RADIOLOGY    No results found.  PROCEDURES:  Critical Care performed: No  .Nerve Block  Date/Time: 09/11/2022 10:17 PM  Performed by: Darletta Moll, PA-C Authorized by: Darletta Moll, PA-C   Consent:    Consent obtained:  Verbal   Consent given by:  Patient   Risks discussed:  Pain, swelling and unsuccessful block Universal protocol:    Procedure explained and questions answered to patient or proxy's satisfaction: yes     Immediately prior to procedure, a time out was called: yes     Patient identity confirmed:  Verbally with patient Indications:    Indications:  Pain relief Location:    Body area:  Head   Head nerve blocked: Inferior Alveolar Nerve.   Laterality:  Left Skin anesthesia:    Skin anesthesia method:  None Procedure details:    Block needle gauge:  27 G   Anesthetic injected:  Lidocaine 1% WITH epi   Steroid injected:  None   Additive injected:  None   Injection procedure:  Anatomic landmarks identified, introduced needle,  negative aspiration for blood and incremental injection   Paresthesia:  None Post-procedure details:    Dressing:  None   Outcome:  Anesthesia achieved   Procedure completion:  Tolerated well, no immediate complications    MEDICATIONS ORDERED IN ED: Medications  HYDROcodone-acetaminophen (NORCO/VICODIN) 5-325 MG per tablet 1 tablet (has no administration in time range)  lidocaine-EPINEPHrine (XYLOCAINE W/EPI) 2 %-1:100000 (with pres) injection 1.7 mL (has no administration in time range)     IMPRESSION / MDM / ASSESSMENT AND PLAN / ED COURSE  I reviewed the triage vital signs and the nursing notes.                              Differential diagnosis includes, but is not limited to, dental infection, broken dentition, need for pain control  Patient's presentation is most consistent with acute presentation with potential threat to life  or bodily function.   Patient's diagnosis is consistent with dental infection.  Patient presents to the ED concern for ongoing pain.  She was already diagnosed with a dental infection by dentist and placed on clindamycin.  Patient was not giving thing for pain and is having intense throbbing pain keeping her from being able to sleep.  Patient was given a dental block as described above with good relief.  Patient is given a limited amount of Vicodin for additional symptom control at home.  Follow-up with dentist..  Patient is given ED precautions to return to the ED for any worsening or new symptoms.        FINAL CLINICAL IMPRESSION(S) / ED DIAGNOSES   Final diagnoses:  Dental infection  Pain, dental     Rx / DC Orders   ED Discharge Orders          Ordered    HYDROcodone-acetaminophen (NORCO/VICODIN) 5-325 MG tablet  Every 4 hours PRN        09/11/22 2220             Note:  This document was prepared using Dragon voice recognition software and may include unintentional dictation errors.   Brynda Peon 09/11/22 2221    Harvest Dark, MD 09/21/22 1447

## 2022-09-11 NOTE — Discharge Instructions (Signed)
Pick up a probiotic to protect her stomach while on the antibiotic.  Take the pain medication as needed.  He may also use ibuprofen in addition to the pain medication for further relief.

## 2022-09-23 ENCOUNTER — Encounter: Payer: Self-pay | Admitting: Oncology
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
## 2022-09-24 ENCOUNTER — Encounter: Payer: Medicaid Other | Admitting: Obstetrics and Gynecology

## 2022-09-30 ENCOUNTER — Ambulatory Visit (LOCAL_COMMUNITY_HEALTH_CENTER): Payer: Medicaid Other

## 2022-09-30 DIAGNOSIS — Z719 Counseling, unspecified: Secondary | ICD-10-CM

## 2022-09-30 DIAGNOSIS — Z23 Encounter for immunization: Secondary | ICD-10-CM | POA: Diagnosis not present

## 2022-09-30 NOTE — Progress Notes (Signed)
  Are you feeling sick today? No   Have you ever received a dose of COVID-19 Vaccine? [employer], [place], [place], [place], Other) Yes  If yes, which vaccine and how many doses?   PFIZER, 3   Did you bring the vaccination record card or other documentation?  No   Do you have a health condition or are undergoing treatment that makes you moderately or severely immunocompromised? This would include, but not be limited to: cancer, HIV, organ transplant, immunosuppressive therapy/high-dose corticosteroids, or moderate/severe primary immunodeficiency.  No  Have you received COVID-19 vaccine before or during hematopoietic cell transplant (HCT) or CAR-T-cell therapies? No  Have you ever had an allergic reaction to: (This would include a severe allergic reaction or a reaction that caused hives, swelling, or respiratory distress, including wheezing.) A component of a COVID-19 vaccine or a previous dose of COVID-19 vaccine? Yes-SHELLFISH ALLERGY-ANAPHYLAXIS   Have you ever had an allergic reaction to another vaccine (other thanCOVID-19 vaccine) or an injectable medication? (This would include a severe allergic reaction or a reaction that caused hives, swelling, or respiratory distress, including wheezing.)   No    Do you have a history of any of the following:  Myocarditis or Pericarditis No  Dermal fillers:  No  Multisystem Inflammatory Syndrome (MIS-C or MIS-A)? No  COVID-19 disease within the past 3 months? No  Vaccinated with monkeypox vaccine in the last 4 weeks? No   Eligible and administered Comirnaty12y+2023-2024, and Fluzone, monitored and tolerated well. Verbalized understanding of VIS and NCIR copy. M.Chera Slivka, LPN.

## 2022-10-14 ENCOUNTER — Ambulatory Visit (INDEPENDENT_AMBULATORY_CARE_PROVIDER_SITE_OTHER): Payer: Medicaid Other | Admitting: Obstetrics and Gynecology

## 2022-10-14 ENCOUNTER — Encounter: Payer: Self-pay | Admitting: Obstetrics and Gynecology

## 2022-10-14 VITALS — BP 112/66 | HR 82 | Wt 211.0 lb

## 2022-10-14 DIAGNOSIS — N921 Excessive and frequent menstruation with irregular cycle: Secondary | ICD-10-CM | POA: Diagnosis not present

## 2022-10-14 DIAGNOSIS — N83201 Unspecified ovarian cyst, right side: Secondary | ICD-10-CM | POA: Diagnosis not present

## 2022-10-14 DIAGNOSIS — N83202 Unspecified ovarian cyst, left side: Secondary | ICD-10-CM | POA: Diagnosis not present

## 2022-10-14 DIAGNOSIS — E039 Hypothyroidism, unspecified: Secondary | ICD-10-CM | POA: Diagnosis not present
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
## 2022-10-25 IMAGING — CR DG NECK SOFT TISSUE
2 series · 2 of 2 positions shown · non-contrast
Comparison: CT neck 09/11/2011

CLINICAL DATA: Neck pain and throat swelling. Feels like something
in the throat.

EXAM:
NECK SOFT TISSUES - 1+ VIEW

[neck lat]
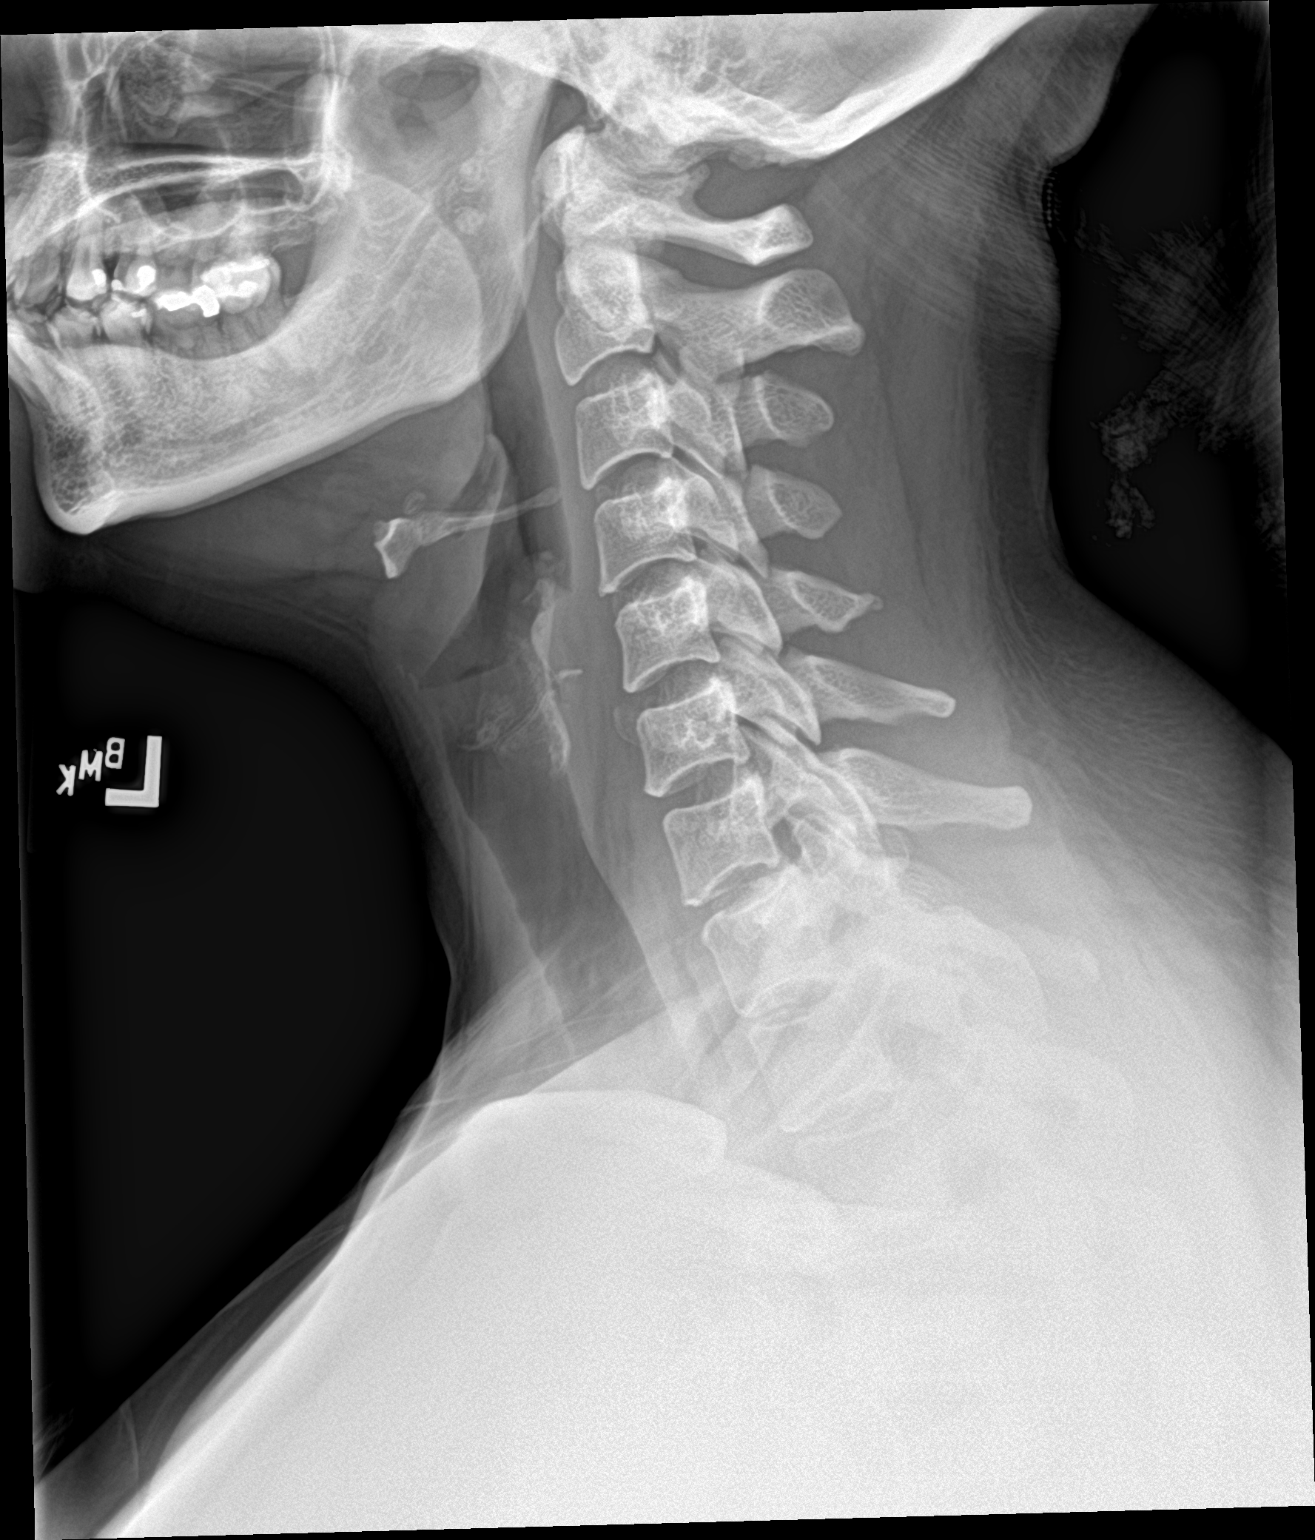

[neck ap]
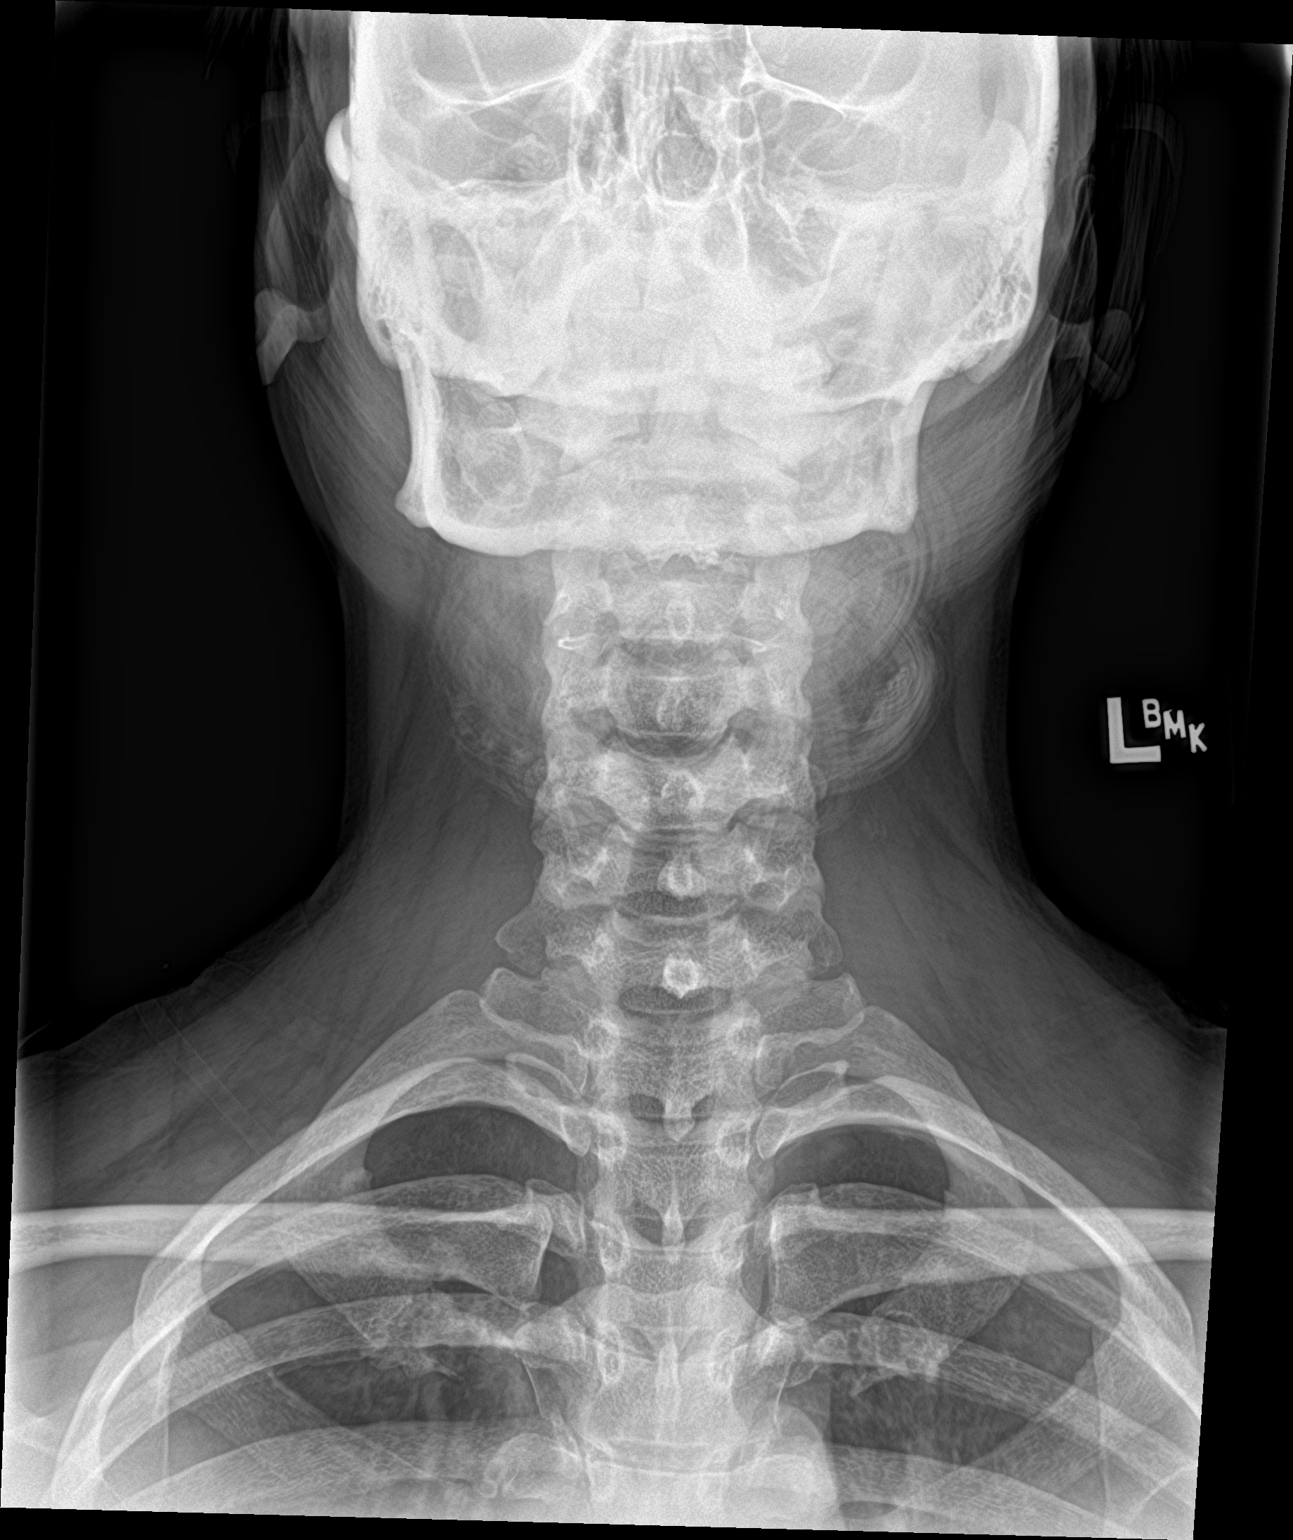

[2 of 2 positions shown; findings below may reference images not displayed]

FINDINGS: There is no evidence of retropharyngeal soft tissue swelling or
epiglottic enlargement. The cervical airway is unremarkable and no
radio-opaque foreign body identified.
IMPRESSION: Negative.

## 2022-11-29 DIAGNOSIS — I2510 Atherosclerotic heart disease of native coronary artery without angina pectoris: Secondary | ICD-10-CM | POA: Diagnosis not present

## 2022-11-29 DIAGNOSIS — Z833 Family history of diabetes mellitus: Secondary | ICD-10-CM | POA: Diagnosis not present

## 2022-11-29 DIAGNOSIS — K219 Gastro-esophageal reflux disease without esophagitis: Secondary | ICD-10-CM | POA: Diagnosis not present

## 2022-11-29 DIAGNOSIS — Z7984 Long term (current) use of oral hypoglycemic drugs: Secondary | ICD-10-CM | POA: Diagnosis not present

## 2022-11-29 DIAGNOSIS — E669 Obesity, unspecified: Secondary | ICD-10-CM | POA: Diagnosis not present

## 2022-11-29 DIAGNOSIS — E785 Hyperlipidemia, unspecified: Secondary | ICD-10-CM | POA: Diagnosis not present

## 2022-11-29 DIAGNOSIS — E119 Type 2 diabetes mellitus without complications: Secondary | ICD-10-CM | POA: Diagnosis not present

## 2022-11-29 DIAGNOSIS — Z7951 Long term (current) use of inhaled steroids: Secondary | ICD-10-CM | POA: Diagnosis not present

## 2022-11-29 DIAGNOSIS — Z7901 Long term (current) use of anticoagulants: Secondary | ICD-10-CM | POA: Diagnosis not present

## 2022-11-29 DIAGNOSIS — Z8249 Family history of ischemic heart disease and other diseases of the circulatory system: Secondary | ICD-10-CM | POA: Diagnosis not present

## 2022-11-29 DIAGNOSIS — J449 Chronic obstructive pulmonary disease, unspecified: Secondary | ICD-10-CM | POA: Diagnosis not present

## 2022-11-29 DIAGNOSIS — I10 Essential (primary) hypertension: Secondary | ICD-10-CM | POA: Diagnosis not present

## 2023-01-09 DIAGNOSIS — M25511 Pain in right shoulder: Secondary | ICD-10-CM | POA: Diagnosis not present

## 2023-01-09 DIAGNOSIS — E119 Type 2 diabetes mellitus without complications: Secondary | ICD-10-CM | POA: Diagnosis not present

## 2023-01-20 ENCOUNTER — Other Ambulatory Visit: Payer: Self-pay

## 2023-01-20 MED ORDER — DAPAGLIFLOZIN PROPANEDIOL 10 MG PO TABS: 10.0000 mg | ORAL_TABLET | Freq: Every day | ORAL | 3 refills | Status: DC

## 2023-01-30 ENCOUNTER — Encounter: Payer: Self-pay | Admitting: Oncology

## 2023-02-05 ENCOUNTER — Encounter: Payer: Self-pay | Admitting: Cardiovascular Disease

## 2023-02-05 ENCOUNTER — Other Ambulatory Visit: Payer: Self-pay | Admitting: Nurse Practitioner

## 2023-02-05 ENCOUNTER — Ambulatory Visit: Payer: 59 | Admitting: Cardiovascular Disease

## 2023-02-05 ENCOUNTER — Telehealth: Payer: Self-pay

## 2023-02-05 ENCOUNTER — Ambulatory Visit: Payer: 59 | Admitting: Internal Medicine

## 2023-02-05 VITALS — BP 128/68 | HR 93 | Ht 67.0 in | Wt 204.0 lb

## 2023-02-05 DIAGNOSIS — I5189 Other ill-defined heart diseases: Secondary | ICD-10-CM | POA: Diagnosis not present

## 2023-02-05 DIAGNOSIS — I340 Nonrheumatic mitral (valve) insufficiency: Secondary | ICD-10-CM | POA: Diagnosis not present

## 2023-02-05 DIAGNOSIS — R0602 Shortness of breath: Secondary | ICD-10-CM | POA: Diagnosis not present

## 2023-02-05 DIAGNOSIS — I10 Essential (primary) hypertension: Secondary | ICD-10-CM

## 2023-02-05 NOTE — Telephone Encounter (Signed)
No idea what the rules are
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
## 2023-02-05 NOTE — Assessment & Plan Note (Addendum)
Patient continues to have shortness of breath worse with activity. Patient on maximum therapy for diastolic dysfunction. CCTA 03/2022 score 0 with normal coronaries.

## 2023-02-05 NOTE — Telephone Encounter (Signed)
Patient is asking if there is anything specific that she needs to be able to take her dog out in public as her ESA such as on the bus or to the grocery store?

## 2023-02-06 LAB — CBC WITH DIFFERENTIAL/PLATELET
Basophils Absolute: 0 10*3/uL (ref 0.0–0.2)
Basos: 0 %
EOS (ABSOLUTE): 0 10*3/uL (ref 0.0–0.4)
Eos: 0 %
Hematocrit: 42.3 % (ref 34.0–46.6)
Hemoglobin: 13.8 g/dL (ref 11.1–15.9)
Immature Grans (Abs): 0 10*3/uL (ref 0.0–0.1)
Immature Granulocytes: 0 %
Lymphocytes Absolute: 1.8 10*3/uL (ref 0.7–3.1)
Lymphs: 25 %
MCH: 28.6 pg (ref 26.6–33.0)
MCHC: 32.6 g/dL (ref 31.5–35.7)
MCV: 88 fL (ref 79–97)
Monocytes Absolute: 0.5 10*3/uL (ref 0.1–0.9)
Monocytes: 8 %
Neutrophils Absolute: 4.9 10*3/uL (ref 1.4–7.0)
Neutrophils: 67 %
Platelets: 215 10*3/uL (ref 150–450)
RBC: 4.82 x10E6/uL (ref 3.77–5.28)
RDW: 13.9 % (ref 11.7–15.4)
WBC: 7.2 10*3/uL (ref 3.4–10.8)

## 2023-02-06 LAB — LIPID PANEL W/O CHOL/HDL RATIO
Cholesterol, Total: 124 mg/dL (ref 100–199)
HDL: 54 mg/dL (ref >39)
LDL Chol Calc (NIH): 59 mg/dL (ref 0–99)
Triglycerides: 44 mg/dL (ref 0–149)
VLDL Cholesterol Cal: 11 mg/dL (ref 5–40)

## 2023-02-06 LAB — COMPREHENSIVE METABOLIC PANEL
ALT: 121 IU/L — ABNORMAL HIGH (ref 0–32)
AST: 54 IU/L — ABNORMAL HIGH (ref 0–40)
Albumin/Globulin Ratio: 1.9 (ref 1.2–2.2)
Albumin: 4.7 g/dL (ref 3.9–4.9)
Alkaline Phosphatase: 106 IU/L (ref 44–121)
BUN/Creatinine Ratio: 12 (ref 9–23)
BUN: 14 mg/dL (ref 6–24)
Bilirubin Total: 0.4 mg/dL (ref 0.0–1.2)
CO2: 20 mmol/L (ref 20–29)
Calcium: 9.1 mg/dL (ref 8.7–10.2)
Chloride: 105 mmol/L (ref 96–106)
Creatinine, Ser: 1.2 mg/dL — ABNORMAL HIGH (ref 0.57–1.00)
Globulin, Total: 2.5 g/dL (ref 1.5–4.5)
Glucose: 80 mg/dL (ref 70–99)
Potassium: 4.5 mmol/L (ref 3.5–5.2)
Sodium: 140 mmol/L (ref 134–144)
Total Protein: 7.2 g/dL (ref 6.0–8.5)
eGFR: 58 mL/min/{1.73_m2} — ABNORMAL LOW (ref >59)

## 2023-02-06 LAB — TSH+T4F+T3FREE
Free T4: 1.17 ng/dL (ref 0.82–1.77)
T3, Free: 3 pg/mL (ref 2.0–4.4)
TSH: 4.25 u[IU]/mL (ref 0.450–4.500)

## 2023-02-06 LAB — HGB A1C W/O EAG: Hgb A1c MFr Bld: 6.2 % — ABNORMAL HIGH (ref 4.8–5.6)

## 2023-02-06 LAB — HIV ANTIBODY (ROUTINE TESTING W REFLEX): HIV Screen 4th Generation wRfx: NONREACTIVE

## 2023-02-09 ENCOUNTER — Ambulatory Visit: Payer: 59 | Admitting: Nurse Practitioner

## 2023-02-13 ENCOUNTER — Ambulatory Visit: Payer: 59 | Admitting: Nurse Practitioner

## 2023-02-16 ENCOUNTER — Ambulatory Visit: Payer: 59 | Admitting: Nurse Practitioner

## 2023-02-16 ENCOUNTER — Inpatient Hospital Stay: Payer: Self-pay

## 2023-02-16 VITALS — BP 110/60 | HR 81 | Ht 66.5 in | Wt 200.0 lb

## 2023-02-16 DIAGNOSIS — M797 Fibromyalgia: Secondary | ICD-10-CM

## 2023-02-16 DIAGNOSIS — I999 Unspecified disorder of circulatory system: Secondary | ICD-10-CM

## 2023-02-16 DIAGNOSIS — E039 Hypothyroidism, unspecified: Secondary | ICD-10-CM

## 2023-02-16 DIAGNOSIS — K219 Gastro-esophageal reflux disease without esophagitis: Secondary | ICD-10-CM

## 2023-02-16 DIAGNOSIS — M5136 Other intervertebral disc degeneration, lumbar region: Secondary | ICD-10-CM

## 2023-02-16 DIAGNOSIS — F4310 Post-traumatic stress disorder, unspecified: Secondary | ICD-10-CM

## 2023-02-16 MED ORDER — AMITRIPTYLINE HCL 100 MG PO TABS: 100.0000 mg | ORAL_TABLET | Freq: Every day | ORAL | 3 refills | Status: AC
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
## 2023-02-16 NOTE — Patient Instructions (Signed)
1) Reviewed abnormal labs, elevated LFTs, A1c and lower eGFR 2) Increasing amitriptyline from 75 to 100 mg nightly at bedtime 3) Pt will call Medicaid for exercise possibilities 4) Pt to return for follow up eGFR and AS/ALT in 1 month, will call with results 5) Follow up appt in 4 months, fasting labs prior

## 2023-02-25 ENCOUNTER — Encounter: Payer: Self-pay | Admitting: *Deleted

## 2023-02-25 ENCOUNTER — Other Ambulatory Visit: Payer: Self-pay

## 2023-02-25 ENCOUNTER — Emergency Department
Admission: EM | Admit: 2023-02-25 | Discharge: 2023-02-26 | Disposition: A | Discharge status: Home / Self Care | Payer: No Typology Code available for payment source | Attending: Emergency Medicine | Admitting: Emergency Medicine

## 2023-02-25 DIAGNOSIS — Z1152 Encounter for screening for COVID-19: Secondary | ICD-10-CM | POA: Diagnosis not present

## 2023-02-25 DIAGNOSIS — I10 Essential (primary) hypertension: Secondary | ICD-10-CM | POA: Diagnosis not present

## 2023-02-25 DIAGNOSIS — F333 Major depressive disorder, recurrent, severe with psychotic symptoms: Secondary | ICD-10-CM | POA: Diagnosis not present

## 2023-02-25 DIAGNOSIS — E039 Hypothyroidism, unspecified: Secondary | ICD-10-CM | POA: Insufficient documentation

## 2023-02-25 DIAGNOSIS — Z85850 Personal history of malignant neoplasm of thyroid: Secondary | ICD-10-CM | POA: Diagnosis not present

## 2023-02-25 DIAGNOSIS — J449 Chronic obstructive pulmonary disease, unspecified: Secondary | ICD-10-CM | POA: Insufficient documentation

## 2023-02-25 DIAGNOSIS — F4310 Post-traumatic stress disorder, unspecified: Secondary | ICD-10-CM | POA: Diagnosis present

## 2023-02-25 DIAGNOSIS — R443 Hallucinations, unspecified: Secondary | ICD-10-CM

## 2023-02-25 DIAGNOSIS — F203 Undifferentiated schizophrenia: Secondary | ICD-10-CM | POA: Diagnosis not present

## 2023-02-25 DIAGNOSIS — E119 Type 2 diabetes mellitus without complications: Secondary | ICD-10-CM | POA: Insufficient documentation

## 2023-02-25 DIAGNOSIS — N809 Endometriosis, unspecified: Secondary | ICD-10-CM | POA: Insufficient documentation

## 2023-02-25 LAB — CBC
HCT: 43.5 % (ref 36.0–46.0)
Hemoglobin: 14.1 g/dL (ref 12.0–15.0)
MCH: 28.3 pg (ref 26.0–34.0)
MCHC: 32.4 g/dL (ref 30.0–36.0)
MCV: 87.3 fL (ref 80.0–100.0)
Platelets: 251 10*3/uL (ref 150–400)
RBC: 4.98 MIL/uL (ref 3.87–5.11)
RDW: 15.2 % (ref 11.5–15.5)
WBC: 9.2 10*3/uL (ref 4.0–10.5)
nRBC: 0 % (ref 0.0–0.2)

## 2023-02-25 LAB — COMPREHENSIVE METABOLIC PANEL
ALT: 96 U/L — ABNORMAL HIGH (ref 0–44)
AST: 37 U/L (ref 15–41)
Albumin: 4.3 g/dL (ref 3.5–5.0)
Alkaline Phosphatase: 92 U/L (ref 38–126)
Anion gap: 7 (ref 5–15)
BUN: 25 mg/dL — ABNORMAL HIGH (ref 6–20)
CO2: 23 mmol/L (ref 22–32)
Calcium: 8.8 mg/dL — ABNORMAL LOW (ref 8.9–10.3)
Chloride: 104 mmol/L (ref 98–111)
Creatinine, Ser: 1.42 mg/dL — ABNORMAL HIGH (ref 0.44–1.00)
GFR, Estimated: 47 mL/min — ABNORMAL LOW (ref >60)
Glucose, Bld: 95 mg/dL (ref 70–99)
Potassium: 4.1 mmol/L (ref 3.5–5.1)
Sodium: 134 mmol/L — ABNORMAL LOW (ref 135–145)
Total Bilirubin: 0.5 mg/dL (ref 0.3–1.2)
Total Protein: 7.9 g/dL (ref 6.5–8.1)

## 2023-02-25 LAB — URINE DRUG SCREEN, QUALITATIVE (ARMC ONLY)
Amphetamines, Ur Screen: NOT DETECTED
Barbiturates, Ur Screen: NOT DETECTED
Benzodiazepine, Ur Scrn: NOT DETECTED
Cannabinoid 50 Ng, Ur ~~LOC~~: NOT DETECTED
Cocaine Metabolite,Ur ~~LOC~~: NOT DETECTED
MDMA (Ecstasy)Ur Screen: NOT DETECTED
Methadone Scn, Ur: NOT DETECTED
Opiate, Ur Screen: NOT DETECTED
Phencyclidine (PCP) Ur S: NOT DETECTED
Tricyclic, Ur Screen: POSITIVE — AB

## 2023-02-25 LAB — PREGNANCY, URINE: Preg Test, Ur: NEGATIVE

## 2023-02-25 LAB — TSH: TSH: 10.435 u[IU]/mL — ABNORMAL HIGH (ref 0.350–4.500)

## 2023-02-25 LAB — SALICYLATE LEVEL: Salicylate Lvl: <7 mg/dL — ABNORMAL LOW (ref 7.0–30.0)

## 2023-02-25 LAB — ACETAMINOPHEN LEVEL: Acetaminophen (Tylenol), Serum: <10 ug/mL — ABNORMAL LOW (ref 10–30)

## 2023-02-25 LAB — ETHANOL: Alcohol, Ethyl (B): <10 mg/dL (ref <10)

## 2023-02-25 LAB — T4, FREE: Free T4: 0.6 ng/dL — ABNORMAL LOW (ref 0.61–1.12)

## 2023-02-25 NOTE — ED Provider Notes (Addendum)
[facility] Provider Note    Event Date/Time   First MD Initiated Contact with Patient 02/25/23 2229     (approximate)   History   Psychiatric Evaluation   HPI  Kristina Li is a 42 y.o. female   Past medical history of PTSD, hypothyroid, chronic pain, COPD, diabetes, fibromyalgia, GERD, hypertension, major depressive disorder, presents to the emergency department requesting psychiatric evaluation for auditory hallucinations, paranoia.  She states over the last week she is seeing shadows and heard voices that are disconcerting to her.  She denies drug use, alcohol use, ingestions.  She has been compliant with her psychiatric medications.  She denies suicidality or homicidality.  She has had hallucinations in the past.  Denies recent illnesses or trauma.  She has no other acute medical complaints.  External Medical Documents Reviewed: April 2022 emergency department visit for hallucinations seen by psychiatry      Physical Exam   Triage Vital Signs: ED Triage Vitals [02/25/23 2213]  Enc Vitals Group     BP      Pulse      Resp      Temp      Temp src      SpO2      Weight 204 lb (92.5 kg)     Height 5\' 6"  (1.676 m)     Head Circumference      Peak Flow      Pain Score 0     Pain Loc      Pain Edu?      Excl. in [place]?     Most recent vital signs: There were no vitals filed for this visit.  General: Awake, no distress.  CV:  Good peripheral perfusion.  Resp:  Normal effort.  Abd:  No distention.  Other:  Awake alert comfortable pleasant cooperative.   ED Results / Procedures / Treatments   Labs (all labs ordered are listed, but only abnormal results are displayed) Labs Reviewed  COMPREHENSIVE METABOLIC PANEL - Abnormal; Notable for the following components:      Result Value   Sodium 134 (*)    BUN 25 (*)    Creatinine, Ser 1.42 (*)    Calcium 8.8 (*)    ALT 96 (*)    GFR, Estimated 47 (*)    All other components  within normal limits  ETHANOL  CBC  SALICYLATE LEVEL  ACETAMINOPHEN LEVEL  URINE DRUG SCREEN, QUALITATIVE ([facility] ONLY)  TSH  T4, FREE  PREGNANCY, URINE  POC URINE PREG, ED     I ordered and reviewed the above labs they are notable for fattening is 1.4 from 1.2, electrolytes otherwise within normal limit    PROCEDURES:  Critical Care performed: No  Procedures   MEDICATIONS ORDERED IN ED: Medications - No data to display  IMPRESSION / MDM / [place] / ED COURSE  I reviewed the triage vital signs and the nursing notes.                                Patient's presentation is most consistent with acute presentation with potential threat to life or bodily function.  Differential diagnosis includes, but is not limited to, acute decompensated psychiatric illness, thyroid dysfunction, electrolyte disturbance, substance-induced mood disorder   MDM: This is a patient with psychiatric history and history of hallucinations as well as medical comorbidities who presents to the emergency department with  paranoia, hallucinations.  She has no other acute medical complaints and has been compliant with her medications.  Check basic labs, thyroid studies, toxicologic labs and psychiatric evaluation.  She is here voluntarily.  She does not appear to be floridly psychotic, suicidal, homicidal and I do not see a reason to keep her as an involuntary at this time.      FINAL CLINICAL IMPRESSION(S) / ED DIAGNOSES   Final diagnoses:  Hallucinations     Rx / DC Orders   ED Discharge Orders     None        Note:  This document was prepared using Dragon voice recognition software and may include unintentional dictation errors.    Lucillie Garfinkel, MD 02/25/23 2258    Lucillie Garfinkel, MD 02/25/23 [phone]

## 2023-02-25 NOTE — ED Triage Notes (Signed)
Pt brought in by [employer].  Pt feels depressed .   Denies SI or HI.  Pt states someone is coming in and out of her apartment, someone is cutting hair on her dog.  Pt denies etoh or drug use.  Pt calm and cooperative.   Pt is Voluntary

## 2023-02-25 NOTE — ED Notes (Signed)
Black sandas Black shirt Black pants Stripe shirt and pants Black underwear Cell phone Purple bonnet Keys 2 Ring Necklace crown yellow colored necklace  Earrings yellow hoops

## 2023-02-26 DIAGNOSIS — F203 Undifferentiated schizophrenia: Secondary | ICD-10-CM

## 2023-02-26 LAB — RESP PANEL BY RT-PCR (RSV, FLU A&B, COVID)  RVPGX2
Influenza A by PCR: NEGATIVE
Influenza B by PCR: NEGATIVE
Resp Syncytial Virus by PCR: NEGATIVE
SARS Coronavirus 2 by RT PCR: NEGATIVE

## 2023-02-26 NOTE — ED Notes (Signed)
Consent for transfer and transportation to [facility] obtained on paper. Pt using phone to update mother.
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
## 2023-02-26 NOTE — ED Notes (Signed)
VOL/Consult ordered/Completed/Rec.Inpt. Admit when medically clear

## 2023-02-26 NOTE — BH Assessment (Signed)
Per [place] Maudie Mercury), patient to be referred out of system.  Referral information for Psychiatric Hospitalization faxed to;   [facility] [phone]- [phone]) No beds available  [facility] [phone]- [phone]),   [facility] [phone]),  [address] [phone]),   [place] [phone] -or- [phone]),   [facility] [phone])  [facility] [phone])

## 2023-02-26 NOTE — ED Notes (Signed)
Hospital meal provided.  100% consumed, pt tolerated w/o complaints.  Waste discarded appropriately.   

## 2023-02-26 NOTE — ED Notes (Signed)
Ambulated to [employer] by this Therapist, sports. Appropriate paperwork handed to [employer] driver. 1 bag of belonging placed in back of car.

## 2023-02-26 NOTE — ED Notes (Addendum)
Safe transport will arrive a little after 11 for transport to [facility]

## 2023-02-26 NOTE — BH Assessment (Signed)
PATIENT BED AVAILABLE AFTER 11AM ON 02/26/23  Patient has been accepted to [facility].  Accepting physician is Dr. Emelda Brothers.  Call report to [phone].  Representative was [place].   ER Staff is aware of it:  [facility] ER Secretary  Dr. Tamala Julian, ER MD  Gerald Stabs Patient's Nurse
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
## 2023-02-26 NOTE — BH Assessment (Signed)
Writer faxed copy of the updated COVID test result and confirmed it was received Wells Guiles). Updated patient's nurse Ebony Hail).

## 2023-02-26 NOTE — ED Notes (Signed)
Hospital meal provided, pt tolerated w/o complaints.  Waste discarded appropriately.  

## 2023-03-11 ENCOUNTER — Telehealth: Payer: Self-pay

## 2023-03-11 NOTE — Telephone Encounter (Signed)
Patient called stating that she needs to get her abilify refilled, she states she was in the hospital for 1 week but after looking in her chart it was only for 1 day, she was in there for delusions and other things please refer back to her ED visit

## 2023-03-13 ENCOUNTER — Other Ambulatory Visit: Payer: Self-pay | Admitting: Nurse Practitioner

## 2023-03-14 ENCOUNTER — Emergency Department: Payer: Medicaid Other

## 2023-03-14 ENCOUNTER — Other Ambulatory Visit: Payer: Self-pay

## 2023-03-14 ENCOUNTER — Emergency Department
Admission: EM | Admit: 2023-03-14 | Discharge: 2023-03-14 | Disposition: A | Discharge status: Home / Self Care | Payer: Medicaid Other | Attending: Emergency Medicine | Admitting: Emergency Medicine

## 2023-03-14 DIAGNOSIS — M25511 Pain in right shoulder: Secondary | ICD-10-CM | POA: Diagnosis not present

## 2023-03-14 DIAGNOSIS — J449 Chronic obstructive pulmonary disease, unspecified: Secondary | ICD-10-CM | POA: Diagnosis not present

## 2023-03-14 DIAGNOSIS — G8929 Other chronic pain: Secondary | ICD-10-CM

## 2023-03-14 DIAGNOSIS — I10 Essential (primary) hypertension: Secondary | ICD-10-CM | POA: Insufficient documentation

## 2023-03-14 DIAGNOSIS — K5900 Constipation, unspecified: Secondary | ICD-10-CM

## 2023-03-14 DIAGNOSIS — R1084 Generalized abdominal pain: Secondary | ICD-10-CM

## 2023-03-14 LAB — URINE DRUG SCREEN, QUALITATIVE (ARMC ONLY)
Amphetamines, Ur Screen: NOT DETECTED
Barbiturates, Ur Screen: NOT DETECTED
Benzodiazepine, Ur Scrn: NOT DETECTED
Cannabinoid 50 Ng, Ur ~~LOC~~: NOT DETECTED
Cocaine Metabolite,Ur ~~LOC~~: NOT DETECTED
MDMA (Ecstasy)Ur Screen: NOT DETECTED
Methadone Scn, Ur: NOT DETECTED
Opiate, Ur Screen: NOT DETECTED
Phencyclidine (PCP) Ur S: NOT DETECTED
Tricyclic, Ur Screen: POSITIVE — AB

## 2023-03-14 LAB — COMPREHENSIVE METABOLIC PANEL
ALT: 130 U/L — ABNORMAL HIGH (ref 0–44)
AST: 45 U/L — ABNORMAL HIGH (ref 15–41)
Albumin: 4.3 g/dL (ref 3.5–5.0)
Alkaline Phosphatase: 84 U/L (ref 38–126)
Anion gap: 9 (ref 5–15)
BUN: 11 mg/dL (ref 6–20)
CO2: 25 mmol/L (ref 22–32)
Calcium: 9.1 mg/dL (ref 8.9–10.3)
Chloride: 104 mmol/L (ref 98–111)
Creatinine, Ser: 1.14 mg/dL — ABNORMAL HIGH (ref 0.44–1.00)
GFR, Estimated: >60 mL/min (ref >60)
Glucose, Bld: 94 mg/dL (ref 70–99)
Potassium: 4.1 mmol/L (ref 3.5–5.1)
Sodium: 138 mmol/L (ref 135–145)
Total Bilirubin: 0.6 mg/dL (ref 0.3–1.2)
Total Protein: 7.6 g/dL (ref 6.5–8.1)

## 2023-03-14 LAB — URINALYSIS, COMPLETE (UACMP) WITH MICROSCOPIC
Bacteria, UA: NONE SEEN
Bilirubin Urine: NEGATIVE
Glucose, UA: >=500 mg/dL — AB
Hgb urine dipstick: NEGATIVE
Ketones, ur: NEGATIVE mg/dL
Leukocytes,Ua: NEGATIVE
Nitrite: NEGATIVE
Protein, ur: NEGATIVE mg/dL
Specific Gravity, Urine: 1.022 (ref 1.005–1.030)
pH: 5 (ref 5.0–8.0)

## 2023-03-14 LAB — CBC
HCT: 43.6 % (ref 36.0–46.0)
Hemoglobin: 13.9 g/dL (ref 12.0–15.0)
MCH: 28.4 pg (ref 26.0–34.0)
MCHC: 31.9 g/dL (ref 30.0–36.0)
MCV: 89.2 fL (ref 80.0–100.0)
Platelets: 254 10*3/uL (ref 150–400)
RBC: 4.89 MIL/uL (ref 3.87–5.11)
RDW: 15.9 % — ABNORMAL HIGH (ref 11.5–15.5)
WBC: 7.9 10*3/uL (ref 4.0–10.5)
nRBC: 0 % (ref 0.0–0.2)

## 2023-03-14 LAB — CBG MONITORING, ED: Glucose-Capillary: 85 mg/dL (ref 70–99)

## 2023-03-14 LAB — POC URINE PREG, ED: Preg Test, Ur: NEGATIVE
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
## 2023-03-14 NOTE — Discharge Instructions (Addendum)
For bowel washout:  Please use MiraLAX one half capful every hour until your first bowel movement.  Please do not take any MiraLAX after this for at least 24 hours.  You may use one half capful twice a day of MiraLAX in order to have 1 solid well-formed bowel movement per day.  You may increase or decrease this dosage as needed to obtain this 1 well-formed bowel movement.  Please make sure that you are drinking at least 8 ounces of water every hour during this initial bowel regimen.
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
## 2023-03-17 ENCOUNTER — Ambulatory Visit: Payer: 59 | Admitting: Nurse Practitioner

## 2023-03-17 ENCOUNTER — Encounter: Payer: Self-pay | Admitting: Nurse Practitioner

## 2023-03-17 VITALS — BP 110/72 | HR 83 | Ht 66.5 in | Wt 208.0 lb

## 2023-03-17 DIAGNOSIS — R109 Unspecified abdominal pain: Secondary | ICD-10-CM | POA: Diagnosis not present

## 2023-03-17 DIAGNOSIS — M797 Fibromyalgia: Secondary | ICD-10-CM

## 2023-03-17 DIAGNOSIS — I10 Essential (primary) hypertension: Secondary | ICD-10-CM

## 2023-03-17 DIAGNOSIS — E039 Hypothyroidism, unspecified: Secondary | ICD-10-CM | POA: Diagnosis not present

## 2023-03-17 DIAGNOSIS — J301 Allergic rhinitis due to pollen: Secondary | ICD-10-CM

## 2023-03-17 LAB — POCT URINALYSIS DIPSTICK
Bilirubin, UA: NEGATIVE
Blood, UA: NEGATIVE
Glucose, UA: POSITIVE — AB
Ketones, UA: NEGATIVE
Leukocytes, UA: NEGATIVE
Nitrite, UA: NEGATIVE
Protein, UA: NEGATIVE
Spec Grav, UA: 1.02 (ref 1.010–1.025)
Urobilinogen, UA: 0.2 E.U./dL
pH, UA: 6 (ref 5.0–8.0)

## 2023-03-17 MED ORDER — FLUTICASONE PROPIONATE 50 MCG/ACT NA SUSP
2.0000 | Freq: Every day | NASAL | Status: DC
Start: 2023-03-17 — End: 2023-04-14

## 2023-03-17 MED ORDER — CETIRIZINE HCL 10 MG PO TABS
10.0000 mg | ORAL_TABLET | Freq: Every day | ORAL | 2 refills | Status: AC
Start: 2023-03-17 — End: 2024-09-29

## 2023-03-17 NOTE — Patient Instructions (Signed)
1) UA today 2) Cetirizine and Fluticasone NS for allergies 3) Pelvic US 4) Will contact patient with results when available

## 2023-03-17 NOTE — Telephone Encounter (Signed)
Letter type and will give to patient today

## 2023-03-17 NOTE — Addendum Note (Signed)
Addended by: Hassell Halim on: 03/17/2023 09:51 AM   Modules accepted: Orders
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
## 2023-04-07 ENCOUNTER — Other Ambulatory Visit: Payer: Self-pay | Admitting: Cardiovascular Disease

## 2023-04-07 ENCOUNTER — Other Ambulatory Visit: Payer: Self-pay | Admitting: Nurse Practitioner

## 2023-04-14 ENCOUNTER — Telehealth: Payer: Self-pay | Admitting: Nurse Practitioner

## 2023-04-14 ENCOUNTER — Other Ambulatory Visit: Payer: Self-pay | Admitting: Nurse Practitioner

## 2023-04-14 MED ORDER — FLUTICASONE PROPIONATE 50 MCG/ACT NA SUSP: 2.0000 | Freq: Every day | NASAL | 0 refills | Status: DC

## 2023-04-14 NOTE — Telephone Encounter (Signed)
Mandi with [facility] called stating that the patient said a nasal spray was supposed to be sent in with her cetirizine a month ago. I looked and the Flonase was accidentally entered as a clinic administered med instead of sent to pharmacy. I went ahead and sent to [facility].

## 2023-04-22 ENCOUNTER — Telehealth: Payer: Self-pay | Admitting: Nurse Practitioner

## 2023-04-22 NOTE — Telephone Encounter (Signed)
Patient called and left VM requesting we order an MRI of her right shoulder. States she is still having a lot of pain with it and the tramadol is not really helping. Please advise.

## 2023-04-30 ENCOUNTER — Encounter: Payer: Self-pay | Admitting: Oncology

## 2023-05-07 ENCOUNTER — Ambulatory Visit (INDEPENDENT_AMBULATORY_CARE_PROVIDER_SITE_OTHER): Payer: 59 | Admitting: Cardiovascular Disease

## 2023-05-07 ENCOUNTER — Encounter: Payer: Self-pay | Admitting: Cardiovascular Disease

## 2023-05-07 ENCOUNTER — Ambulatory Visit: Payer: 59 | Admitting: Cardiovascular Disease

## 2023-05-07 VITALS — BP 112/70 | HR 86 | Ht 66.0 in | Wt 211.0 lb

## 2023-05-07 DIAGNOSIS — I5189 Other ill-defined heart diseases: Secondary | ICD-10-CM

## 2023-05-07 DIAGNOSIS — I5031 Acute diastolic (congestive) heart failure: Secondary | ICD-10-CM

## 2023-05-07 DIAGNOSIS — I340 Nonrheumatic mitral (valve) insufficiency: Secondary | ICD-10-CM | POA: Diagnosis not present

## 2023-05-07 DIAGNOSIS — R0602 Shortness of breath: Secondary | ICD-10-CM

## 2023-05-07 DIAGNOSIS — I10 Essential (primary) hypertension: Secondary | ICD-10-CM | POA: Diagnosis not present

## 2023-05-07 DIAGNOSIS — R002 Palpitations: Secondary | ICD-10-CM

## 2023-05-07 NOTE — Assessment & Plan Note (Signed)
stable °
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
## 2023-05-12 ENCOUNTER — Other Ambulatory Visit: Payer: Self-pay | Admitting: Nurse Practitioner

## 2023-05-12 ENCOUNTER — Other Ambulatory Visit: Payer: Self-pay | Admitting: Cardiovascular Disease

## 2023-05-14 DIAGNOSIS — J449 Chronic obstructive pulmonary disease, unspecified: Secondary | ICD-10-CM | POA: Diagnosis not present

## 2023-05-14 DIAGNOSIS — E785 Hyperlipidemia, unspecified: Secondary | ICD-10-CM | POA: Diagnosis not present

## 2023-05-14 DIAGNOSIS — E039 Hypothyroidism, unspecified: Secondary | ICD-10-CM | POA: Diagnosis not present

## 2023-05-14 DIAGNOSIS — D6869 Other thrombophilia: Secondary | ICD-10-CM | POA: Diagnosis not present

## 2023-05-14 DIAGNOSIS — I10 Essential (primary) hypertension: Secondary | ICD-10-CM | POA: Diagnosis not present

## 2023-05-14 DIAGNOSIS — K5900 Constipation, unspecified: Secondary | ICD-10-CM | POA: Diagnosis not present

## 2023-05-14 DIAGNOSIS — I2510 Atherosclerotic heart disease of native coronary artery without angina pectoris: Secondary | ICD-10-CM | POA: Diagnosis not present

## 2023-05-14 DIAGNOSIS — G8929 Other chronic pain: Secondary | ICD-10-CM | POA: Diagnosis not present

## 2023-05-14 DIAGNOSIS — E669 Obesity, unspecified: Secondary | ICD-10-CM | POA: Diagnosis not present

## 2023-05-14 DIAGNOSIS — K219 Gastro-esophageal reflux disease without esophagitis: Secondary | ICD-10-CM | POA: Diagnosis not present

## 2023-05-14 DIAGNOSIS — I4891 Unspecified atrial fibrillation: Secondary | ICD-10-CM | POA: Diagnosis not present

## 2023-05-14 DIAGNOSIS — E119 Type 2 diabetes mellitus without complications: Secondary | ICD-10-CM | POA: Diagnosis not present

## 2023-05-22 ENCOUNTER — Encounter: Payer: Self-pay | Admitting: Oncology

## 2023-05-28 ENCOUNTER — Telehealth: Payer: Self-pay | Admitting: Nurse Practitioner

## 2023-05-28 NOTE — Telephone Encounter (Signed)
Patient left VM needing [place] to resend her lab results and diagnosis via mail.

## 2023-05-31 ENCOUNTER — Inpatient Hospital Stay
Admission: AD | Admit: 2023-05-31 | Discharge: 2023-06-08 | DRG: 885 | Disposition: A | Discharge status: Home / Self Care | Payer: 59 | Source: Intra-hospital | Attending: Psychiatry | Admitting: Psychiatry

## 2023-05-31 ENCOUNTER — Emergency Department
Admission: EM | Admit: 2023-05-31 | Discharge: 2023-05-31 | Disposition: A | Discharge status: Other Acute Inpatient Hospital | Payer: 59 | Attending: Student in an Organized Health Care Education/Training Program | Admitting: Student in an Organized Health Care Education/Training Program

## 2023-05-31 ENCOUNTER — Other Ambulatory Visit: Payer: Self-pay

## 2023-05-31 ENCOUNTER — Encounter: Payer: Self-pay | Admitting: Oncology

## 2023-05-31 DIAGNOSIS — F419 Anxiety disorder, unspecified: Secondary | ICD-10-CM | POA: Insufficient documentation

## 2023-05-31 DIAGNOSIS — J4489 Other specified chronic obstructive pulmonary disease: Secondary | ICD-10-CM | POA: Diagnosis not present

## 2023-05-31 DIAGNOSIS — G4700 Insomnia, unspecified: Secondary | ICD-10-CM | POA: Diagnosis present

## 2023-05-31 DIAGNOSIS — F22 Delusional disorders: Secondary | ICD-10-CM | POA: Diagnosis not present

## 2023-05-31 DIAGNOSIS — I10 Essential (primary) hypertension: Secondary | ICD-10-CM | POA: Diagnosis present

## 2023-05-31 DIAGNOSIS — E119 Type 2 diabetes mellitus without complications: Secondary | ICD-10-CM | POA: Diagnosis present

## 2023-05-31 DIAGNOSIS — E890 Postprocedural hypothyroidism: Secondary | ICD-10-CM | POA: Diagnosis present

## 2023-05-31 DIAGNOSIS — G894 Chronic pain syndrome: Secondary | ICD-10-CM | POA: Diagnosis not present

## 2023-05-31 DIAGNOSIS — Z85850 Personal history of malignant neoplasm of thyroid: Secondary | ICD-10-CM | POA: Diagnosis not present

## 2023-05-31 DIAGNOSIS — K219 Gastro-esophageal reflux disease without esophagitis: Secondary | ICD-10-CM | POA: Diagnosis not present

## 2023-05-31 DIAGNOSIS — J45909 Unspecified asthma, uncomplicated: Secondary | ICD-10-CM | POA: Diagnosis not present

## 2023-05-31 DIAGNOSIS — F323 Major depressive disorder, single episode, severe with psychotic features: Secondary | ICD-10-CM | POA: Diagnosis present

## 2023-05-31 DIAGNOSIS — R443 Hallucinations, unspecified: Secondary | ICD-10-CM | POA: Diagnosis not present

## 2023-05-31 DIAGNOSIS — Z7901 Long term (current) use of anticoagulants: Secondary | ICD-10-CM

## 2023-05-31 DIAGNOSIS — Z888 Allergy status to other drugs, medicaments and biological substances status: Secondary | ICD-10-CM

## 2023-05-31 DIAGNOSIS — F4310 Post-traumatic stress disorder, unspecified: Secondary | ICD-10-CM | POA: Diagnosis present

## 2023-05-31 DIAGNOSIS — M79604 Pain in right leg: Principal | ICD-10-CM

## 2023-05-31 DIAGNOSIS — Z833 Family history of diabetes mellitus: Secondary | ICD-10-CM | POA: Diagnosis not present

## 2023-05-31 DIAGNOSIS — Z818 Family history of other mental and behavioral disorders: Secondary | ICD-10-CM | POA: Diagnosis not present

## 2023-05-31 DIAGNOSIS — Z823 Family history of stroke: Secondary | ICD-10-CM

## 2023-05-31 DIAGNOSIS — Z8261 Family history of arthritis: Secondary | ICD-10-CM | POA: Diagnosis not present

## 2023-05-31 DIAGNOSIS — Z91013 Allergy to seafood: Secondary | ICD-10-CM | POA: Diagnosis not present

## 2023-05-31 DIAGNOSIS — J449 Chronic obstructive pulmonary disease, unspecified: Secondary | ICD-10-CM | POA: Diagnosis not present

## 2023-05-31 DIAGNOSIS — Z79899 Other long term (current) drug therapy: Secondary | ICD-10-CM

## 2023-05-31 DIAGNOSIS — F333 Major depressive disorder, recurrent, severe with psychotic symptoms: Secondary | ICD-10-CM | POA: Insufficient documentation

## 2023-05-31 DIAGNOSIS — Z91040 Latex allergy status: Secondary | ICD-10-CM | POA: Diagnosis not present

## 2023-05-31 DIAGNOSIS — Z825 Family history of asthma and other chronic lower respiratory diseases: Secondary | ICD-10-CM

## 2023-05-31 DIAGNOSIS — Z8249 Family history of ischemic heart disease and other diseases of the circulatory system: Secondary | ICD-10-CM | POA: Diagnosis not present

## 2023-05-31 DIAGNOSIS — M25511 Pain in right shoulder: Secondary | ICD-10-CM | POA: Diagnosis present

## 2023-05-31 DIAGNOSIS — E039 Hypothyroidism, unspecified: Secondary | ICD-10-CM | POA: Diagnosis not present

## 2023-05-31 DIAGNOSIS — M797 Fibromyalgia: Secondary | ICD-10-CM | POA: Diagnosis not present

## 2023-05-31 DIAGNOSIS — Z91048 Other nonmedicinal substance allergy status: Secondary | ICD-10-CM

## 2023-05-31 DIAGNOSIS — Z923 Personal history of irradiation: Secondary | ICD-10-CM

## 2023-05-31 DIAGNOSIS — Z79890 Hormone replacement therapy (postmenopausal): Secondary | ICD-10-CM | POA: Diagnosis not present

## 2023-05-31 DIAGNOSIS — Z7951 Long term (current) use of inhaled steroids: Secondary | ICD-10-CM

## 2023-05-31 LAB — CBC
HCT: 39.4 % (ref 36.0–46.0)
Hemoglobin: 12.5 g/dL (ref 12.0–15.0)
MCH: 28.8 pg (ref 26.0–34.0)
MCHC: 31.7 g/dL (ref 30.0–36.0)
MCV: 90.8 fL (ref 80.0–100.0)
Platelets: 229 10*3/uL (ref 150–400)
RBC: 4.34 MIL/uL (ref 3.87–5.11)
RDW: 14.5 % (ref 11.5–15.5)
WBC: 7.2 10*3/uL (ref 4.0–10.5)
nRBC: 0 % (ref 0.0–0.2)

## 2023-05-31 LAB — URINE DRUG SCREEN, QUALITATIVE (ARMC ONLY)
Amphetamines, Ur Screen: NOT DETECTED
Barbiturates, Ur Screen: NOT DETECTED
Benzodiazepine, Ur Scrn: NOT DETECTED
Cannabinoid 50 Ng, Ur ~~LOC~~: NOT DETECTED
Cocaine Metabolite,Ur ~~LOC~~: NOT DETECTED
MDMA (Ecstasy)Ur Screen: NOT DETECTED
Methadone Scn, Ur: NOT DETECTED
Opiate, Ur Screen: NOT DETECTED
Phencyclidine (PCP) Ur S: NOT DETECTED
Tricyclic, Ur Screen: POSITIVE — AB

## 2023-05-31 LAB — ACETAMINOPHEN LEVEL: Acetaminophen (Tylenol), Serum: <10 ug/mL — ABNORMAL LOW (ref 10–30)

## 2023-05-31 LAB — COMPREHENSIVE METABOLIC PANEL
ALT: 87 U/L — ABNORMAL HIGH (ref 0–44)
AST: 63 U/L — ABNORMAL HIGH (ref 15–41)
Albumin: 3.9 g/dL (ref 3.5–5.0)
Alkaline Phosphatase: 65 U/L (ref 38–126)
Anion gap: 6 (ref 5–15)
BUN: 17 mg/dL (ref 6–20)
CO2: 21 mmol/L — ABNORMAL LOW (ref 22–32)
Calcium: 8.5 mg/dL — ABNORMAL LOW (ref 8.9–10.3)
Chloride: 108 mmol/L (ref 98–111)
Creatinine, Ser: 1.11 mg/dL — ABNORMAL HIGH (ref 0.44–1.00)
GFR, Estimated: >60 mL/min (ref >60)
Glucose, Bld: 102 mg/dL — ABNORMAL HIGH (ref 70–99)
Potassium: 4 mmol/L (ref 3.5–5.1)
Sodium: 135 mmol/L (ref 135–145)
Total Bilirubin: 0.5 mg/dL (ref 0.3–1.2)
Total Protein: 7.3 g/dL (ref 6.5–8.1)

## 2023-05-31 LAB — CBG MONITORING, ED: Glucose-Capillary: 94 mg/dL (ref 70–99)

## 2023-05-31 LAB — ETHANOL: Alcohol, Ethyl (B): <10 mg/dL (ref <10)

## 2023-05-31 LAB — POC URINE PREG, ED: Preg Test, Ur: NEGATIVE

## 2023-05-31 LAB — SALICYLATE LEVEL: Salicylate Lvl: <7 mg/dL — ABNORMAL LOW (ref 7.0–30.0)

## 2023-05-31 LAB — TSH: TSH: 7.38 u[IU]/mL — ABNORMAL HIGH (ref 0.350–4.500)

## 2023-05-31 LAB — GLUCOSE, CAPILLARY: Glucose-Capillary: 102 mg/dL — ABNORMAL HIGH (ref 70–99)

## 2023-05-31 MED ORDER — OLANZAPINE 10 MG IM SOLR: 10.0000 mg | Freq: Every day | INTRAMUSCULAR | Status: DC | PRN

## 2023-05-31 MED ORDER — AMITRIPTYLINE HCL 50 MG PO TABS: 100.0000 mg | ORAL_TABLET | Freq: Every day | ORAL | Status: DC

## 2023-05-31 MED ORDER — LEVOTHYROXINE SODIUM 50 MCG PO TABS: 75.0000 ug | ORAL_TABLET | Freq: Every day | ORAL | Status: DC

## 2023-05-31 MED ORDER — AMOXICILLIN-POT CLAVULANATE 875-125 MG PO TABS: 1.0000 | ORAL_TABLET | Freq: Two times a day (BID) | ORAL | Status: DC

## 2023-05-31 MED ORDER — AMITRIPTYLINE HCL 100 MG PO TABS
100.0000 mg | ORAL_TABLET | Freq: Every day | ORAL | Status: DC
  Administered 2023-05-31 – 2023-06-07 (×8): 100 mg via ORAL
  Filled 2023-05-31 (×8): qty 1

## 2023-05-31 MED ORDER — DAPAGLIFLOZIN PROPANEDIOL 10 MG PO TABS: 10.0000 mg | ORAL_TABLET | Freq: Every day | ORAL | Status: DC

## 2023-05-31 MED ORDER — LISINOPRIL 5 MG PO TABS
10.0000 mg | ORAL_TABLET | Freq: Every day | ORAL | Status: DC
  Administered 2023-06-01 – 2023-06-08 (×8): 10 mg via ORAL
  Filled 2023-05-31 (×8): qty 2

## 2023-05-31 MED ORDER — ROSUVASTATIN CALCIUM 20 MG PO TABS
40.0000 mg | ORAL_TABLET | Freq: Every day | ORAL | Status: DC
  Administered 2023-06-01 – 2023-06-08 (×8): 40 mg via ORAL
  Filled 2023-05-31 (×8): qty 2

## 2023-05-31 MED ORDER — RIVAROXABAN 20 MG PO TABS
20.0000 mg | ORAL_TABLET | Freq: Every day | ORAL | Status: DC
  Filled 2023-05-31: qty 1

## 2023-05-31 MED ORDER — LORATADINE 10 MG PO TABS
10.0000 mg | ORAL_TABLET | Freq: Every day | ORAL | Status: DC
  Administered 2023-06-01 – 2023-06-08 (×8): 10 mg via ORAL
  Filled 2023-05-31 (×8): qty 1

## 2023-05-31 MED ORDER — ACETAMINOPHEN 325 MG PO TABS
650.0000 mg | ORAL_TABLET | Freq: Four times a day (QID) | ORAL | Status: DC | PRN
  Administered 2023-06-01 – 2023-06-04 (×6): 650 mg via ORAL
  Filled 2023-05-31 (×6): qty 2

## 2023-05-31 MED ORDER — MAGNESIUM HYDROXIDE 400 MG/5ML PO SUSP
30.0000 mL | Freq: Every day | ORAL | Status: DC | PRN
  Administered 2023-06-02 – 2023-06-07 (×3): 30 mL via ORAL
  Filled 2023-05-31 (×3): qty 30

## 2023-05-31 MED ORDER — LISINOPRIL 10 MG PO TABS
10.0000 mg | ORAL_TABLET | Freq: Every day | ORAL | Status: DC
  Administered 2023-05-31: 10 mg via ORAL
  Filled 2023-05-31: qty 1

## 2023-05-31 MED ORDER — LIOTHYRONINE SODIUM 5 MCG PO TABS
5.0000 ug | ORAL_TABLET | Freq: Every day | ORAL | Status: DC
  Administered 2023-06-01 – 2023-06-04 (×4): 5 ug via ORAL
  Filled 2023-05-31 (×4): qty 1

## 2023-05-31 MED ORDER — PANTOPRAZOLE SODIUM 40 MG PO TBEC
40.0000 mg | DELAYED_RELEASE_TABLET | Freq: Two times a day (BID) | ORAL | Status: DC
  Administered 2023-05-31: 40 mg via ORAL
  Filled 2023-05-31: qty 1

## 2023-05-31 MED ORDER — IBUPROFEN 600 MG PO TABS
600.0000 mg | ORAL_TABLET | Freq: Four times a day (QID) | ORAL | Status: DC | PRN
  Filled 2023-05-31: qty 1

## 2023-05-31 MED ORDER — OLANZAPINE 10 MG PO TABS: 10.0000 mg | ORAL_TABLET | Freq: Every day | ORAL | Status: DC | PRN

## 2023-05-31 MED ORDER — FLUTICASONE PROPIONATE 50 MCG/ACT NA SUSP
2.0000 | Freq: Every day | NASAL | Status: DC
  Administered 2023-06-01 – 2023-06-08 (×8): 2 via NASAL
  Filled 2023-05-31: qty 16

## 2023-05-31 MED ORDER — PANTOPRAZOLE SODIUM 40 MG PO TBEC
40.0000 mg | DELAYED_RELEASE_TABLET | Freq: Two times a day (BID) | ORAL | Status: DC
  Administered 2023-05-31 – 2023-06-08 (×16): 40 mg via ORAL
  Filled 2023-05-31 (×16): qty 1

## 2023-05-31 MED ORDER — RIVAROXABAN 20 MG PO TABS
20.0000 mg | ORAL_TABLET | Freq: Every day | ORAL | Status: DC
  Administered 2023-05-31 – 2023-06-07 (×8): 20 mg via ORAL
  Filled 2023-05-31 (×9): qty 1

## 2023-05-31 MED ORDER — DAPAGLIFLOZIN PROPANEDIOL 10 MG PO TABS
10.0000 mg | ORAL_TABLET | Freq: Every day | ORAL | Status: DC
  Administered 2023-06-01 – 2023-06-08 (×8): 10 mg via ORAL
  Filled 2023-05-31 (×8): qty 1

## 2023-05-31 MED ORDER — ROSUVASTATIN CALCIUM 20 MG PO TABS
40.0000 mg | ORAL_TABLET | Freq: Every day | ORAL | Status: DC
  Administered 2023-05-31: 40 mg via ORAL
  Filled 2023-05-31: qty 2

## 2023-05-31 MED ORDER — DIAZEPAM 5 MG PO TABS
5.0000 mg | ORAL_TABLET | Freq: Once | ORAL | Status: AC
  Administered 2023-05-31: 5 mg via ORAL
  Filled 2023-05-31: qty 1

## 2023-05-31 MED ORDER — HYDROXYZINE HCL 25 MG PO TABS
25.0000 mg | ORAL_TABLET | Freq: Three times a day (TID) | ORAL | Status: DC | PRN
  Administered 2023-06-01: 25 mg via ORAL
  Filled 2023-05-31: qty 1

## 2023-05-31 MED ORDER — SPIRONOLACTONE 25 MG PO TABS
25.0000 mg | ORAL_TABLET | Freq: Every day | ORAL | Status: DC
  Administered 2023-06-01 – 2023-06-08 (×8): 25 mg via ORAL
  Filled 2023-05-31 (×8): qty 1

## 2023-05-31 MED ORDER — ALUM & MAG HYDROXIDE-SIMETH 200-200-20 MG/5ML PO SUSP
30.0000 mL | ORAL | Status: DC | PRN
  Administered 2023-06-01 – 2023-06-08 (×4): 30 mL via ORAL
  Filled 2023-05-31 (×4): qty 30

## 2023-05-31 MED ORDER — METOPROLOL SUCCINATE ER 50 MG PO TB24
100.0000 mg | ORAL_TABLET | Freq: Every day | ORAL | Status: DC
  Administered 2023-05-31: 100 mg via ORAL
  Filled 2023-05-31: qty 2

## 2023-05-31 MED ORDER — OLANZAPINE 10 MG PO TABS
10.0000 mg | ORAL_TABLET | Freq: Every day | ORAL | Status: DC | PRN
  Administered 2023-06-05 – 2023-06-08 (×3): 10 mg via ORAL
  Filled 2023-05-31 (×3): qty 1

## 2023-05-31 MED ORDER — LIOTHYRONINE SODIUM 5 MCG PO TABS
5.0000 ug | ORAL_TABLET | Freq: Every day | ORAL | Status: DC
  Filled 2023-05-31: qty 1

## 2023-05-31 MED ORDER — SPIRONOLACTONE 25 MG PO TABS
25.0000 mg | ORAL_TABLET | Freq: Every day | ORAL | Status: DC
  Administered 2023-05-31: 25 mg via ORAL
  Filled 2023-05-31: qty 1

## 2023-05-31 MED ORDER — DROSPIRENONE 4 MG PO TABS
1.0000 | ORAL_TABLET | Freq: Every day | ORAL | Status: DC
  Administered 2023-06-02 – 2023-06-08 (×7): 4 mg via ORAL
  Filled 2023-05-31 (×5): qty 1

## 2023-05-31 MED ORDER — LORATADINE 10 MG PO TABS
10.0000 mg | ORAL_TABLET | Freq: Every day | ORAL | Status: DC
  Administered 2023-05-31: 10 mg via ORAL
  Filled 2023-05-31: qty 1

## 2023-05-31 MED ORDER — LEVOTHYROXINE SODIUM 75 MCG PO TABS
75.0000 ug | ORAL_TABLET | Freq: Every day | ORAL | Status: DC
  Administered 2023-06-01 – 2023-06-08 (×8): 75 ug via ORAL
  Filled 2023-05-31 (×8): qty 1

## 2023-05-31 MED ORDER — DROSPIRENONE 4 MG PO TABS: 1.0000 | ORAL_TABLET | Freq: Every day | ORAL | Status: DC

## 2023-05-31 MED ORDER — FLUTICASONE PROPIONATE 50 MCG/ACT NA SUSP
2.0000 | Freq: Every day | NASAL | Status: DC
  Administered 2023-05-31: 2 via NASAL
  Filled 2023-05-31: qty 16

## 2023-05-31 MED ORDER — METOPROLOL SUCCINATE ER 25 MG PO TB24
100.0000 mg | ORAL_TABLET | Freq: Every day | ORAL | Status: DC
  Administered 2023-06-01 – 2023-06-08 (×8): 100 mg via ORAL
  Filled 2023-05-31 (×8): qty 4

## 2023-05-31 MED ORDER — HYDROXYZINE HCL 25 MG PO TABS
25.0000 mg | ORAL_TABLET | Freq: Three times a day (TID) | ORAL | Status: DC | PRN
  Administered 2023-05-31: 25 mg via ORAL
  Filled 2023-05-31: qty 1

## 2023-05-31 NOTE — Progress Notes (Signed)
Room 303 prepared for the patient's arrival.  Dr. Marlou Porch will be the provider.

## 2023-05-31 NOTE — ED Notes (Signed)
Pt signed paper copy of [facility] consent at this time.

## 2023-05-31 NOTE — ED Notes (Signed)
Pt resting in hall bed, ate breakfast.

## 2023-05-31 NOTE — ED Provider Notes (Signed)
[facility] Provider Note    Event Date/Time   First MD Initiated Contact with Patient 05/31/23 0801     (approximate)   History   Hallucinations and Paranoid   HPI  Kristina Li is a 42 y.o. 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  She feels like someone has come in and filed her nails as well as worried that someone has shaved her dog's legs.  She denies any SI.  No HI but keeps repeating that she feels very paranoid.  Feels like she needs to be here in the hospital.  Has required hospitalization in the past.     Physical Exam   Triage Vital Signs: ED Triage Vitals  Enc Vitals Group     BP 05/31/23 0730 139/82     Pulse Rate 05/31/23 0730 84     Resp 05/31/23 0730 18     Temp 05/31/23 0730 98.5 F (36.9 C)     Temp src --      SpO2 05/31/23 0730 100 %     Weight 05/31/23 0731 200 lb (90.7 kg)     Height 05/31/23 0731 5' 6.5" (1.689 m)     Head Circumference --      Peak Flow --      Pain Score 05/31/23 0730 0     Pain Loc --      Pain Edu? --      Excl. in GC? --     Most recent vital signs: Vitals:   05/31/23 0730  BP: 139/82  Pulse: 84  Resp: 18  Temp: 98.5 F (36.9 C)  SpO2: 100%     Constitutional: Alert  Eyes: Conjunctivae are normal.  Head: Atraumatic. Nose: No congestion/rhinnorhea. Mouth/Throat: Mucous membranes are moist.   Neck: Painless ROM.  Cardiovascular:   Good peripheral circulation. Respiratory: Normal respiratory effort.  No retractions.  Gastrointestinal: Soft and nontender.  Musculoskeletal:  no deformity Neurologic:  MAE spontaneously. No gross focal neurologic deficits are appreciated.  Skin:  Skin is warm, dry and intact. No rash noted. Psychiatric: Mood anxious, blunted affect    ED Results / Procedures / Treatments   Labs (all labs ordered are listed,  but only abnormal results are displayed) Labs Reviewed  COMPREHENSIVE METABOLIC PANEL - Abnormal; Notable for the following components:      Result Value   CO2 21 (*)    Glucose, Bld 102 (*)    Creatinine, Ser 1.11 (*)    Calcium 8.5 (*)    AST 63 (*)    ALT 87 (*)    All other components within normal limits  SALICYLATE LEVEL - Abnormal; Notable for the following components:   Salicylate Lvl <7.0 (*)    All other components within normal limits  ACETAMINOPHEN LEVEL - Abnormal; Notable for the following components:   Acetaminophen (Tylenol), Serum <10 (*)    All other components within normal limits  ETHANOL  CBC  URINE DRUG SCREEN, QUALITATIVE ([facility] ONLY)  POC URINE PREG, ED     EKG     RADIOLOGY    PROCEDURES:  Critical Care performed:   Procedures   MEDICATIONS ORDERED IN ED: Medications  diazepam (VALIUM) tablet 5 mg (has no administration in time range)     IMPRESSION / MDM / ASSESSMENT AND PLAN / ED COURSE  I reviewed the triage vital  signs and the nursing notes.                              Differential diagnosis includes, but is not limited to, Psychosis, delirium, medication effect, noncompliance, polysubstance abuse, Si, Hi, depression  Pt here for evaluation of symptoms as described above..  Patient has psych history of depression with psychotic features..  Laboratory testing was ordered to evaluation for underlying electrolyte derangement or signs of underlying organic pathology to explain today's presentation.  Based on history and physical and laboratory evaluation, it appears that the patient's presentation is 2/2 underlying psychiatric disorder and will require further evaluation and management by inpatient psychiatry.  Patient is here voluntary.  Disposition pending psychiatric evaluation.  The patient has been placed in psychiatric observation due to the need to provide a safe environment for the patient while obtaining psychiatric consultation  and evaluation, as well as ongoing medical and medication management to treat the patient's condition.        FINAL CLINICAL IMPRESSION(S) / ED DIAGNOSES   Final diagnoses:  Paranoia (HCC)     Rx / DC Orders   ED Discharge Orders     None        Note:  This document was prepared using Dragon voice recognition software and may include unintentional dictation errors.    Willy Eddy, MD 05/31/23 [phone]
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
## 2023-05-31 NOTE — ED Notes (Signed)
Pt resting comfortably in bed

## 2023-05-31 NOTE — ED Notes (Addendum)
Pt dressed out with this RN and Mayra NT. Belongings include: Jeans Wallace Cullens necklace Wallace Cullens earrings 2 gray rings Black hair cover Black and gray sandals White shirt Black bookbag with clothes and miscellaneous  Black hair tie Cell phone Black bra  Pink underwear

## 2023-05-31 NOTE — ED Notes (Signed)
Report given to [facility] RN Onalee Hua at this time.

## 2023-05-31 NOTE — ED Notes (Signed)
First Nurse Note:  Pt via [facility] from home for psychiatric evaluation. Pt called [employer] for hearing voices. Pt is calm and cooperative at this time.

## 2023-05-31 NOTE — ED Notes (Addendum)
Note entered on wrong pt.

## 2023-05-31 NOTE — ED Notes (Signed)
Pt updated on POC at this time. Pt voiced understanding. Pt calm and cooperative at this time.

## 2023-05-31 NOTE — ED Notes (Signed)
Pt admitted downstairs to the [facility]. Pt will not be able to go down until after 8am per [facility].

## 2023-05-31 NOTE — ED Notes (Signed)
Called [facility] at this time for report. [facility] will call back in a few minutes.

## 2023-05-31 NOTE — Progress Notes (Signed)
Patient arrived to the [facility], accompanied by security.

## 2023-05-31 NOTE — ED Notes (Signed)
Provided water applesauce graham crackers blankets.
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
## 2023-05-31 NOTE — Consult Note (Signed)
Per [facility]  Pt. accepted to [facility], room to be assigned by charge RN. Attending physician is DR. Herrick. Dx is MDD. Please call report to [phone] prior to transfer. Provider, please include admission orders, CIWA if appropriate and agitation protocol. Registration, please pre-admit. Thank you

## 2023-05-31 NOTE — ED Notes (Signed)
Per Kristina Li Pt accepted to [facility], room to be assigned by charge RN. Attending physician is DR. Herrick. Please call report to [phone] prior to transfer.

## 2023-05-31 NOTE — ED Notes (Signed)
Lab will run tsh off previous specimen.

## 2023-05-31 NOTE — ED Notes (Signed)
Pt requesting anti anxiety med. Applesauce provided.

## 2023-06-01 ENCOUNTER — Encounter: Payer: Self-pay | Admitting: Psychiatry

## 2023-06-01 DIAGNOSIS — F323 Major depressive disorder, single episode, severe with psychotic features: Secondary | ICD-10-CM | POA: Diagnosis not present

## 2023-06-01 LAB — GLUCOSE, CAPILLARY
Glucose-Capillary: 95 mg/dL (ref 70–99)
Glucose-Capillary: 69 mg/dL — ABNORMAL LOW (ref 70–99)
Glucose-Capillary: 80 mg/dL (ref 70–99)

## 2023-06-01 MED ORDER — CLINDAMYCIN HCL 150 MG PO CAPS
300.0000 mg | ORAL_CAPSULE | Freq: Four times a day (QID) | ORAL | Status: AC
  Administered 2023-06-01 – 2023-06-02 (×3): 300 mg via ORAL
  Filled 2023-06-01 (×3): qty 2

## 2023-06-01 MED ORDER — MAGIC MOUTHWASH
5.0000 mL | Freq: Three times a day (TID) | ORAL | Status: DC | PRN
  Administered 2023-06-01 – 2023-06-08 (×9): 5 mL via ORAL
  Filled 2023-06-01 (×10): qty 5

## 2023-06-01 MED ORDER — IBUPROFEN 200 MG PO TABS
400.0000 mg | ORAL_TABLET | Freq: Four times a day (QID) | ORAL | Status: DC | PRN
  Administered 2023-06-01 – 2023-06-07 (×7): 400 mg via ORAL
  Filled 2023-06-01 (×8): qty 2

## 2023-06-01 MED ORDER — ARIPIPRAZOLE 5 MG PO TABS
15.0000 mg | ORAL_TABLET | Freq: Every day | ORAL | Status: DC
  Administered 2023-06-01: 15 mg via ORAL
  Filled 2023-06-01: qty 1

## 2023-06-01 NOTE — BH IP Treatment Plan (Addendum)
Interdisciplinary Treatment and Diagnostic Plan Update  06/01/2023 Time of Session: 8:50AM Kristina Li MRN: [id number]  Principal Diagnosis: Major depressive disorder, single episode, severe, with psychosis (HCC)  Secondary Diagnoses: Principal Problem:   Major depressive disorder, single episode, severe, with psychosis (HCC)   Current Medications:  Current Facility-Administered Medications  Medication Dose Route Frequency Provider Last Rate Last Admin   acetaminophen (TYLENOL) tablet 650 mg  650 mg Oral Q6H PRN Charm Rings, NP   650 mg at 06/01/23 1044   alum & mag hydroxide-simeth (MAALOX/MYLANTA) 200-200-20 MG/5ML suspension 30 mL  30 mL Oral Q4H PRN Charm Rings, NP       amitriptyline (ELAVIL) tablet 100 mg  100 mg Oral QHS Charm Rings, NP   100 mg at 05/31/23 2149   ARIPiprazole (ABILIFY) tablet 15 mg  15 mg Oral Daily Reggie Pile, MD   15 mg at 06/01/23 1043   clindamycin (CLEOCIN) capsule 300 mg  300 mg Oral Q6H Reggie Pile, MD       dapagliflozin propanediol (FARXIGA) tablet 10 mg  10 mg Oral QAC breakfast Charm Rings, NP   10 mg at 06/01/23 1019   Drospirenone TABS 4 mg  1 tablet Oral Daily Charm Rings, NP       fluticasone (FLONASE) 50 MCG/ACT nasal spray 2 spray  2 spray Each Nare Daily Charm Rings, NP   2 spray at 06/01/23 1044   hydrOXYzine (ATARAX) tablet 25 mg  25 mg Oral TID PRN Charm Rings, NP       ibuprofen (ADVIL) tablet 400 mg  400 mg Oral Q6H PRN Reggie Pile, MD   400 mg at 06/01/23 1121   levothyroxine (SYNTHROID) tablet 75 mcg  75 mcg Oral QAC breakfast Charm Rings, NP   75 mcg at 06/01/23 2841   liothyronine (CYTOMEL) tablet 5 mcg  5 mcg Oral Daily Charm Rings, NP   5 mcg at 06/01/23 1019   lisinopril (ZESTRIL) tablet 10 mg  10 mg Oral Daily Charm Rings, NP   10 mg at 06/01/23 1017   loratadine (CLARITIN) tablet 10 mg  10 mg Oral Daily Charm Rings, NP   10 mg at 06/01/23 1017   magic mouthwash  5 mL Oral TID  PRN Reggie Pile, MD       magnesium hydroxide (MILK OF MAGNESIA) suspension 30 mL  30 mL Oral Daily PRN Charm Rings, NP       metoprolol succinate (TOPROL-XL) 24 hr tablet 100 mg  100 mg Oral Daily Charm Rings, NP   100 mg at 06/01/23 1018   OLANZapine (ZYPREXA) tablet 10 mg  10 mg Oral Daily PRN Charm Rings, NP       Or   OLANZapine (ZYPREXA) injection 10 mg  10 mg Intramuscular Daily PRN Charm Rings, NP       pantoprazole (PROTONIX) EC tablet 40 mg  40 mg Oral BID Charm Rings, NP   40 mg at 06/01/23 1017   rivaroxaban (XARELTO) tablet 20 mg  20 mg Oral Daily Charm Rings, NP   20 mg at 05/31/23 2149   rosuvastatin (CRESTOR) tablet 40 mg  40 mg Oral Daily Charm Rings, NP   40 mg at 06/01/23 1018   spironolactone (ALDACTONE) tablet 25 mg  25 mg Oral Daily Charm Rings, NP   25 mg at 06/01/23 1019   PTA Medications: Medications Prior to Admission  Medication Sig Dispense Refill Last Dose   Accu-Chek Softclix Lancets lancets USE TO CHECK SUGARS TWICE DAILY 100 each 1    amitriptyline (ELAVIL) 100 MG tablet Take 1 tablet (100 mg total) by mouth at bedtime. 90 tablet 3    budesonide-formoterol (SYMBICORT) 80-4.5 MCG/ACT inhaler Inhale 2 puffs into the lungs 2 (two) times daily.      cetirizine (ZYRTEC ALLERGY) 10 MG tablet Take 1 tablet (10 mg total) by mouth daily. 30 tablet 2    dapagliflozin propanediol (FARXIGA) 10 MG TABS tablet Take 1 tablet (10 mg total) by mouth daily before breakfast. 90 tablet 3    fluticasone (FLONASE) 50 MCG/ACT nasal spray PLACE 2 SPRAYS INTO BOTH NOSTRILS ONCE DAILY 16 g 1    GARLIC PO Take 1 tablet by mouth daily.      glucose blood (ACCU-CHEK GUIDE) test strip USE TO CHECK SUGAR TWICE A DAY AS DIRECTED 100 each 1    hydrOXYzine (ATARAX) 25 MG tablet Take 25 mg by mouth 3 (three) times daily as needed for anxiety.      liothyronine (CYTOMEL) 5 MCG tablet TAKE 1 TABLET BY MOUTH ONCE DAILY 30 tablet 3    lisinopril (ZESTRIL) 10 MG  tablet TAKE 1 TABLET BY MOUTH ONCE DAILY 90 tablet 1    metoprolol succinate (TOPROL-XL) 100 MG 24 hr tablet TAKE 1 TABLET BY MOUTH ONCE DAILY 90 tablet 1    Multiple Vitamins-Minerals (WOMENS BONE HEALTH PO) Take 1 tablet by mouth daily.      ondansetron (ZOFRAN) 4 MG tablet Take 1 tablet (4 mg total) by mouth every 8 (eight) hours as needed for nausea or vomiting. 30 tablet 0    pantoprazole (PROTONIX) 40 MG tablet TAKE 1 TABLET BY MOUTH TWICE DAILY 180 tablet 1    rivaroxaban (XARELTO) 20 MG TABS tablet TAKE 1 TABLET BY MOUTH ONCE DAILY *NEED APPOINTMENT FOR FURTHER FILLS* 90 tablet 0    rosuvastatin (CRESTOR) 40 MG tablet TAKE 1 TABLET BY MOUTH ONCE DAILY 90 tablet 1    SLYND 4 MG TABS TAKE 1 TABLET BY MOUTH ONCE DAILY 30 tablet 1    spironolactone (ALDACTONE) 25 MG tablet TAKE 1 TABLET BY MOUTH ONCE DAILY ONCE EVERY MORNING (Patient taking differently: Take 25 mg by mouth daily.) 90 tablet 0    SUMAtriptan (IMITREX) 25 MG tablet Take 25 mg by mouth every 2 (two) hours as needed for migraine. May repeat in 2 hours if headache persists or recurs.      SYNTHROID 75 MCG tablet TAKE 1 TABLET BY MOUTH ONCE DAILY ON AN EMPTY STOMACH. WAIT 30 MINUTES BEFORE TAKING OTHER MEDS. (Patient taking differently: Take 75 mcg by mouth daily before breakfast.) 30 tablet 3    Vitamin D, Ergocalciferol, (DRISDOL) 1.25 MG (50000 UNIT) CAPS capsule TAKE 1 CAPSULE BY MOUTH ONCE A WEEK 4 capsule 11     Patient Stressors: Other: Delusions and fears.    Patient Strengths: Capable of independent living  Forensic psychologist fund of knowledge  Supportive family/friends   Treatment Modalities: Medication Management, Group therapy, Case management,  1 to 1 session with clinician, Psychoeducation, Recreational therapy.   Physician Treatment Plan for Primary Diagnosis: Major depressive disorder, single episode, severe, with psychosis (HCC) Long Term Goal(s): Improvement in symptoms so as ready for discharge    Short Term Goals: Ability to identify and develop effective coping behaviors will improve Ability to maintain clinical measurements within normal limits will improve Ability to identify changes in  lifestyle to reduce recurrence of condition will improve Ability to verbalize feelings will improve  Medication Management: Evaluate patient's response, side effects, and tolerance of medication regimen.  Therapeutic Interventions: 1 to 1 sessions, Unit Group sessions and Medication administration.  Evaluation of Outcomes: Not Met  Physician Treatment Plan for Secondary Diagnosis: Principal Problem:   Major depressive disorder, single episode, severe, with psychosis (HCC)  Long Term Goal(s): Improvement in symptoms so as ready for discharge   Short Term Goals: Ability to identify and develop effective coping behaviors will improve Ability to maintain clinical measurements within normal limits will improve Ability to identify changes in lifestyle to reduce recurrence of condition will improve Ability to verbalize feelings will improve     Medication Management: Evaluate patient's response, side effects, and tolerance of medication regimen.  Therapeutic Interventions: 1 to 1 sessions, Unit Group sessions and Medication administration.  Evaluation of Outcomes: Not Met   RN Treatment Plan for Primary Diagnosis: Major depressive disorder, single episode, severe, with psychosis (HCC) Long Term Goal(s): Knowledge of disease and therapeutic regimen to maintain health will improve  Short Term Goals: Ability to demonstrate self-control, Ability to participate in decision making will improve, Ability to verbalize feelings will improve, Ability to disclose and discuss suicidal ideas, Ability to identify and develop effective coping behaviors will improve, and Compliance with prescribed medications will improve  Medication Management: RN will administer medications as ordered by provider, will  assess and evaluate patient's response and provide education to patient for prescribed medication. RN will report any adverse and/or side effects to prescribing provider.  Therapeutic Interventions: 1 on 1 counseling sessions, Psychoeducation, Medication administration, Evaluate responses to treatment, Monitor vital signs and CBGs as ordered, Perform/monitor CIWA, COWS, AIMS and Fall Risk screenings as ordered, Perform wound care treatments as ordered.  Evaluation of Outcomes: Not Met   LCSW Treatment Plan for Primary Diagnosis: Major depressive disorder, single episode, severe, with psychosis (HCC) Long Term Goal(s): Safe transition to appropriate next level of care at discharge, Engage patient in therapeutic group addressing interpersonal concerns.  Short Term Goals: Engage patient in aftercare planning with referrals and resources, Increase social support, Increase ability to appropriately verbalize feelings, Increase emotional regulation, Facilitate acceptance of mental health diagnosis and concerns, and Increase skills for wellness and recovery  Therapeutic Interventions: Assess for all discharge needs, 1 to 1 time with Social worker, Explore available resources and support systems, Assess for adequacy in community support network, Educate family and significant other(s) on suicide prevention, Complete Psychosocial Assessment, Interpersonal group therapy.  Evaluation of Outcomes: Not Met   Progress in Treatment: Attending groups: Yes. Participating in groups: Yes. Taking medication as prescribed: Yes. Toleration medication: Yes. Family/Significant other contact made: No, will contact:  once permission is given Patient understands diagnosis: Yes. Discussing patient identified problems/goals with staff: Yes. Medical problems stabilized or resolved: Yes. Denies suicidal/homicidal ideation: Yes. Issues/concerns per patient self-inventory: No. Other: none  New problem(s) identified: No,  Describe:  none  New Short Term/Long Term Goal(s): elimination of symptoms of psychosis, medication management for mood stabilization; elimination of SI thoughts; development of comprehensive mental wellness/sobriety plan.   Patient Goals:  "something for paranoia and I've been somewhat delusional and hallucinating"  Discharge Plan or Barriers: CSW to assist in the development of appropriate discharge plans.   Reason for Continuation of Hospitalization: Anxiety Delusions  Depression Hallucinations Medication stabilization  Estimated Length of Stay:  1-7 days  Last 3 [place] Suicide Severity Risk Score: Flowsheet Row Admission (Current)  from 05/31/2023 in [facility] INPATIENT BEHAVIORAL MEDICINE Most recent reading at 05/31/2023  8:30 PM ED from 05/31/2023 in [facility] Emergency Department at [facility] Most recent reading at 05/31/2023  7:32 AM ED from 03/14/2023 in [facility] Emergency Department at [facility] Most recent reading at 03/14/2023 11:08 AM  C-SSRS RISK CATEGORY No Risk No Risk No Risk       Last PHQ 2/9 Scores:    10/14/2022    3:39 PM 12/25/2021   10:42 AM 08/13/2016   12:14 PM  Depression screen PHQ 2/9  Decreased Interest 0 2 0  Down, Depressed, Hopeless 0 1 0  PHQ - 2 Score 0 3 0  Altered sleeping  0   Tired, decreased energy  3   Change in appetite  2   Feeling bad or failure about yourself   0   Trouble concentrating  2   Moving slowly or fidgety/restless  2   Suicidal thoughts  0   PHQ-9 Score  12   Difficult doing work/chores  Somewhat difficult     Scribe for Treatment Team: Harden Mo, [place] 06/01/2023 12:44 PM
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
## 2023-06-01 NOTE — Tx Team (Signed)
Initial Treatment Plan 06/01/2023 12:55 AM Kristina Li RUE:[id number]    PATIENT STRESSORS: Other: Delusions and fears.     PATIENT STRENGTHS: Capable of independent living  Forensic psychologist fund of knowledge  Supportive family/friends    PATIENT IDENTIFIED PROBLEMS: "Maybe we can change my medications so these thoughts will stop."                     DISCHARGE CRITERIA:  Improved stabilization in mood, thinking, and/or behavior Medical problems require only outpatient monitoring Verbal commitment to aftercare and medication compliance  PRELIMINARY DISCHARGE PLAN: Return to previous living arrangement  PATIENT/FAMILY INVOLVEMENT: This treatment plan has been presented to and reviewed with the patient, Kristina Li.  The patient and family have been given the opportunity to ask questions and make suggestions.  Kristina Organ, RN 06/01/2023, 12:55 AM

## 2023-06-01 NOTE — Plan of Care (Signed)

## 2023-06-01 NOTE — Group Note (Signed)
Date:  06/01/2023 Time:  9:17 PM  Group Topic/Focus:  Wellness Toolbox:   The focus of this group is to discuss various aspects of wellness, balancing those aspects and exploring ways to increase the ability to experience wellness.  Patients will create a wellness toolbox for use upon discharge.    Participation Level:  Active  Participation Quality:  Appropriate  Affect:  Appropriate  Cognitive:  Alert and Appropriate  Insight: Appropriate and Good  Engagement in Group:  Developing/Improving and Engaged  Modes of Intervention:  Clarification, Discussion, Education, and Support  Additional Comments:     Morrie Daywalt 06/01/2023, 9:17 PM

## 2023-06-01 NOTE — [facility] Suicide Risk Assessment (Signed)
[facility] Admission Suicide Risk Assessment   Nursing information obtained from:  Patient Demographic factors:  Low socioeconomic status Current Mental Status:  NA Loss Factors:  NA Historical Factors:  NA Risk Reduction Factors:  Religious beliefs about death, Positive social support, Sense of responsibility to family  Total Time spent with patient: 1 hour Principal Problem: Major depressive disorder, single episode, severe, with psychosis (HCC) Diagnosis:  Principal Problem:   Major depressive disorder, single episode, severe, with psychosis (HCC)  Subjective Data:  Patient is a 42 year old female with a history of major depressive disorder, recurrent severe with psychotic features who presents voluntarily to [facility] seeking help with increased paranoia over the past several months in the context of increased stressors.  She was recently admitted to [facility] on March 21 and was restarted on Abilify but does not know the dosage of the medication.  States that she eventually ran out of the medication and could not drive herself to [facility] due to severe shoulder pain.  Been on disability since her early 74s for mental health concerns and has a history of thyroid dysfunctions, specifically thyroid cancer with partial thyroidectomy which patient states has really impacted her knife negatively in terms of motivation and depression.  She denies the use of drugs and alcohol.  No recent medical ailments.  Denies mania.  She denies hallucinations but reports that it is difficult to delineate reality from fiction, especially in regard to people breaking into her house.  Reports that she is very much functional in her life and is able to live alone, noting that providers in the past have suggested she go to a group home but she does not want to.  Reports sufficient sleep and appetite.  Reports that amitriptyline does help with sleep mood and anxiety.  She indicates being part of the  ACT team in the past but does not know why this was discontinued.  She denies suicidal or homicidal thoughts.  She is very pleasant and seeks help.   Recalls being on Invega which caused hyperprolactinemia and Seroquel caused her to "black out."  Other records indicate that she was on Abilify maintainer and Saphris in the past.  Continued Clinical Symptoms:  Alcohol Use Disorder Identification Test Final Score (AUDIT): 1 The "Alcohol Use Disorders Identification Test", Guidelines for Use in Primary Care, Second Edition.  World Science writer [facility]). Score between 0-7:  no or low risk or alcohol related problems. Score between 8-15:  moderate risk of alcohol related problems. Score between 16-19:  high risk of alcohol related problems. Score 20 or above:  warrants further diagnostic evaluation for alcohol dependence and treatment.   CLINICAL FACTORS:   Severe Anxiety and/or Agitation   Musculoskeletal: Strength & Muscle Tone: within normal limits Gait & Station: normal Patient leans: Right  Psychiatric Specialty Exam:  Presentation  General Appearance:  Appropriate for Environment  Eye Contact: Fair  Speech: Clear and Coherent  Speech Volume: Normal  Handedness: Right   Mood and Affect  Mood: Depressed  Affect: Flat; Depressed; Blunt   Thought Process  Thought Processes: Coherent  Descriptions of Associations:Intact  Orientation:Full (Time, Place and Person)  Thought Content:WDL  History of Schizophrenia/Schizoaffective disorder:Yes  Duration of Psychotic Symptoms:Greater than six months  Hallucinations:No data recorded Ideas of Reference:Paranoia  Suicidal Thoughts:No data recorded Homicidal Thoughts:No data recorded  Sensorium  Memory: Immediate Good; Recent Good; Remote Good  Judgment: Fair  Insight: Fair   Art therapist  Concentration: Fair  Attention  Span: Fair  Recall: [employer] of  Knowledge: Fair  Language: Fair   Psychomotor Activity  Psychomotor Activity:No data recorded  Assets  Assets: Communication Skills; Desire for Improvement; Physical Health; Resilience; Financial Resources/Insurance   Sleep  Sleep:No data recorded   Physical Exam: Physical Exam ROS Blood pressure 112/68, pulse 75, temperature 98.5 F (36.9 C), temperature source Oral, resp. rate 19, height 5\' 6"  (1.676 m), weight 91 kg, SpO2 98 %. Body mass index is 32.38 kg/m.   COGNITIVE FEATURES THAT CONTRIBUTE TO RISK:  None    SUICIDE RISK:   Moderate:  Frequent suicidal ideation with limited intensity, and duration, some specificity in terms of plans, no associated intent, good self-control, limited dysphoria/symptomatology, some risk factors present, and identifiable protective factors, including available and accessible social support.  PLAN OF CARE:  Restart Abilify at 15 mg daily.  Risk-benefit side effects and alternatives reviewed including EPS NMS weight gain and sedation Obtain records from her most recent stay at [facility] The patient structure and support This patient was reviewed with staff and nursing Established maintain at the room as the patient Labs and tests reviewed Restart other home medications  I certify that inpatient services furnished can reasonably be expected to improve the patient's condition.   Reggie Pile, MD 06/01/2023, 10:34 AM

## 2023-06-01 NOTE — Group Note (Signed)
Recreation Therapy Group Note   Group Topic:Coping Skills  Group Date: 06/01/2023 Start Time: 1000 End Time: 1045 Facilitators: Rosina Lowenstein, LRT, CTRS Location:  Craft Room  Group Description: Mind Map.  Patient was provided a blank template of a diagram with 32 blank boxes in a tiered system, branching from the center (similar to a bubble chart). LRT directed patients to label the middle of the diagram "Coping Skills". LRT and patients then came up with 8 different coping skills as examples. Pt were directed to record their coping skills in the 2nd tier boxes closest to the center.  Patients would then share their coping skills with the group as LRT wrote them out. LRT gave a handout of 100 different coping skills at the end of group.   Goal Area(s) Addressed: Patients will be able to define "coping skills". Patient will identify new coping skills.  Patient will identify new possible leisure interests.   Affect/Mood: Appropriate and Flat   Participation Level: Active and Engaged   Participation Quality: Independent   Behavior: Calm and Cooperative   Speech/Thought Process: Coherent   Insight: Good   Judgement: Good   Modes of Intervention: Worksheet   Patient Response to Interventions:  Attentive, Engaged, Interested , and Receptive   Education Outcome:  Acknowledges education   Clinical Observations/Individualized Feedback: Kristina Li was active in their participation of session activities and group discussion. Pt identified "complimenting others, gym, and sharing" as coping skills. Pt spontaneously contributed to group discussion while interacting well with LRT and peers duration of session.   Plan: Continue to engage patient in RT group sessions 2-3x/week.   Rosina Lowenstein, LRT, CTRS 06/01/2023 11:27 AM

## 2023-06-01 NOTE — Group Note (Signed)
Date:  06/01/2023 Time:  11:18 PM  Group Topic/Focus:  Wrap-Up Group:   The focus of this group is to help patients review their daily goal of treatment and discuss progress on daily workbooks.  Group Topic/Focus:      Participation Level:  Active  Participation Quality:  Appropriate  Affect:  Appropriate  Cognitive:  Appropriate  Insight: Appropriate  Engagement in Group:  Engaged  Modes of Intervention:  Activity, Education, and Orientation  Additional Comments:  None  Tacy Dura 06/01/2023, 11:18 PM

## 2023-06-01 NOTE — Progress Notes (Signed)
D: Patient alert and oriented, able to make needs known. Denies SI/HI, endorses ongoing AH. Denies pain at present. Patient goal today "to stop being paranoid." Patient reports energy level as normal. She reports she slept well last night. Patient does not request any PRN medication at this time.   A: Scheduled medications administered to patient per MD order. Support and encouragement provided. Routine safety checks conducted every fifteen minutes. Patient informed to notify staff with problems or concerns. Frequent verbal contact made.   R: No adverse drug reactions noted. Patient contracts for safety at this time. Patient is compliant with medications and treatment plan. Patient receptive, calm and cooperative. Patient interacts with others appropriately on unit at present. Patient remains safe at present.

## 2023-06-01 NOTE — Group Note (Signed)
Date:  06/01/2023 Time:  10:17 AM  Group Topic/Focus:  Goals Group:   The focus of this group is to help patients establish daily goals to achieve during treatment and discuss how the patient can incorporate goal setting into their daily lives to aide in recovery.    Participation Level:  Active  Participation Quality:  Appropriate  Affect:  Appropriate  Cognitive:  Appropriate  Insight: Appropriate  Engagement in Group:  Engaged  Modes of Intervention:  Activity and Socialization  Additional Comments:    Wilford Corner 06/01/2023, 10:17 AM

## 2023-06-01 NOTE — [facility] Suicide Risk Assessment (Signed)
[facility] INPATIENT:  Family/Significant Other Suicide Prevention Education  Suicide Prevention Education:  Patient Refusal for Family/Significant Other Suicide Prevention Education: The patient Kristina Li has refused to provide written consent for family/significant other to be provided Family/Significant Other Suicide Prevention Education during admission and/or prior to discharge.  Physician notified.  Harden Mo 06/01/2023, 4:11 PM

## 2023-06-01 NOTE — Progress Notes (Signed)
BG of 102 taken at 2200.
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
## 2023-06-02 DIAGNOSIS — F323 Major depressive disorder, single episode, severe with psychotic features: Secondary | ICD-10-CM | POA: Diagnosis not present

## 2023-06-02 LAB — GLUCOSE, CAPILLARY
Glucose-Capillary: 87 mg/dL (ref 70–99)
Glucose-Capillary: 136 mg/dL — ABNORMAL HIGH (ref 70–99)

## 2023-06-02 MED ORDER — GLUCERNA SHAKE PO LIQD
237.0000 mL | Freq: Three times a day (TID) | ORAL | Status: DC
  Administered 2023-06-02 – 2023-06-08 (×13): 237 mL via ORAL

## 2023-06-02 MED ORDER — HYDROXYZINE HCL 50 MG PO TABS
50.0000 mg | ORAL_TABLET | Freq: Three times a day (TID) | ORAL | Status: DC | PRN
  Administered 2023-06-02 – 2023-06-08 (×10): 50 mg via ORAL
  Filled 2023-06-02 (×11): qty 1

## 2023-06-02 MED ORDER — ARIPIPRAZOLE 10 MG PO TABS
20.0000 mg | ORAL_TABLET | Freq: Every day | ORAL | Status: DC
  Administered 2023-06-02: 20 mg via ORAL
  Filled 2023-06-02 (×3): qty 2

## 2023-06-02 NOTE — Plan of Care (Signed)
  Problem: Education: Goal: Knowledge of General Education information will improve Description: Including pain rating scale, medication(s)/side effects and non-pharmacologic comfort measures Outcome: Progressing   Problem: Health Behavior/Discharge Planning: Goal: Ability to manage health-related needs will improve Outcome: Progressing   Problem: Clinical Measurements: Goal: Ability to maintain clinical measurements within normal limits will improve Outcome: Progressing Goal: Will remain free from infection Outcome: Progressing Goal: Diagnostic test results will improve Outcome: Progressing Goal: Respiratory complications will improve Outcome: Progressing Goal: Cardiovascular complication will be avoided Outcome: Progressing   Problem: Activity: Goal: Risk for activity intolerance will decrease Outcome: Progressing   Problem: Nutrition: Goal: Adequate nutrition will be maintained Outcome: Progressing   Problem: Coping: Goal: Level of anxiety will decrease Outcome: Progressing   Problem: Elimination: Goal: Will not experience complications related to bowel motility Outcome: Progressing Goal: Will not experience complications related to urinary retention Outcome: Progressing   Problem: Pain Managment: Goal: General experience of comfort will improve Outcome: Progressing   Problem: Safety: Goal: Ability to remain free from injury will improve Outcome: Progressing   Problem: Skin Integrity: Goal: Risk for impaired skin integrity will decrease Outcome: Progressing   Problem: Activity: Goal: Will verbalize the importance of balancing activity with adequate rest periods Outcome: Progressing   Problem: Education: Goal: Will be free of psychotic symptoms Outcome: Progressing Goal: Knowledge of the prescribed therapeutic regimen will improve Outcome: Progressing   Problem: Coping: Goal: Coping ability will improve Outcome: Progressing Goal: Will verbalize  feelings Outcome: Progressing   Problem: Health Behavior/Discharge Planning: Goal: Compliance with prescribed medication regimen will improve Outcome: Progressing   Problem: Nutritional: Goal: Ability to achieve adequate nutritional intake will improve Outcome: Progressing   Problem: Role Relationship: Goal: Ability to communicate needs accurately will improve Outcome: Progressing Goal: Ability to interact with others will improve Outcome: Progressing   Problem: Safety: Goal: Ability to redirect hostility and anger into socially appropriate behaviors will improve Outcome: Progressing Goal: Ability to remain free from injury will improve Outcome: Progressing   Problem: Self-Care: Goal: Ability to participate in self-care as condition permits will improve Outcome: Progressing   Problem: Self-Concept: Goal: Will verbalize positive feelings about self Outcome: Progressing   Problem: Education: Goal: Knowledge of  General Education information/materials will improve Outcome: Progressing Goal: Emotional status will improve Outcome: Progressing Goal: Mental status will improve Outcome: Progressing Goal: Verbalization of understanding the information provided will improve Outcome: Progressing   Problem: Activity: Goal: Interest or engagement in activities will improve Outcome: Progressing Goal: Sleeping patterns will improve Outcome: Progressing   Problem: Coping: Goal: Ability to verbalize frustrations and anger appropriately will improve Outcome: Progressing Goal: Ability to demonstrate self-control will improve Outcome: Progressing   Problem: Health Behavior/Discharge Planning: Goal: Identification of resources available to assist in meeting health care needs will improve Outcome: Progressing Goal: Compliance with treatment plan for underlying cause of condition will improve Outcome: Progressing   Problem: Physical Regulation: Goal: Ability to  maintain clinical measurements within normal limits will improve Outcome: Progressing   Problem: Safety: Goal: Periods of time without injury will increase Outcome: Progressing   

## 2023-06-02 NOTE — Plan of Care (Signed)
  Problem: Education: Goal: Knowledge of General Education information will improve Description: Including pain rating scale, medication(s)/side effects and non-pharmacologic comfort measures Outcome: Progressing   Problem: Nutrition: Goal: Adequate nutrition will be maintained Outcome: Progressing   Problem: Coping: Goal: Level of anxiety will decrease Outcome: Progressing   Problem: Pain Managment: Goal: General experience of comfort will improve Outcome: Not Progressing   Problem: Safety: Goal: Ability to remain free from injury will improve Outcome: Progressing   
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
## 2023-06-02 NOTE — Progress Notes (Signed)
Patient is pleasant and cooperative. Medication compliant, appropriate with staff and peers. Patient sleeping well, denies SI, HI, AVH. Noted in dayroom socializing with peers. Complaints of pain to left tooth. Prn given with good relief. Encouragement and support provided. Safety checks maintained. Medications given as prescribed. Pt receptive and remains safe on unit with q15 min checks.

## 2023-06-02 NOTE — Group Note (Signed)
Date:  06/02/2023 Time:  3:32 PM  Group Topic/Focus:  Community Group:   The goal of this group is to inform the patients of the rules and policies of the unit, who to notify and talk to when there is a concern, remind them of the availability of the staff when there is a request or issue.  We also utilize the beach ball which has different ways to express their feelings and moods they are currently experiencing.   Participation Level:  Active  Participation Quality:  Appropriate  Affect:  Appropriate  Cognitive:  Appropriate  Insight: Appropriate  Engagement in Group:  Engaged  Modes of Intervention:  Activity  Additional Comments:    [name] [name] 06/02/2023, 3:32 PM
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
## 2023-06-02 NOTE — Group Note (Signed)
Recreation Therapy Group Note   Group Topic:General Recreation  Group Date: 06/02/2023 Start Time: 1000 End Time: 1050 Facilitators: Rosina Lowenstein, LRT, CTRS Location: Courtyard  Group Description: Outdoor Recreation. Patients had the option to play basketball, draw with sidewalk chalk, play with a deck of cards while outside in the courtyard getting fresh air and sunlight. LRT played music in the background through a speaker. LRT and pts discussed things that they enjoy doing in their free time outside of the hospital.  Goal Area(s) Addressed: Patient will identify leisure interests.  Patient will practice healthy decision making. Patient will engage in recreation activity.  Affect/Mood: Appropriate   Participation Level: Active and Engaged   Participation Quality: Independent   Behavior: Calm and Cooperative   Speech/Thought Process: Coherent   Insight: Good   Judgement: Good   Modes of Intervention: Activity   Patient Response to Interventions:  Attentive, Engaged, Interested , and Receptive   Education Outcome:  Acknowledges education   Clinical Observations/Individualized Feedback: Kristina Li was active in their participation of session activities and group discussion. Pt colored with chalk while outside in the courtyard. Pt interacted well with LRT and peers duration of session.   Plan: Continue to engage patient in RT group sessions 2-3x/week.   Rosina Lowenstein, LRT, CTRS 06/02/2023 11:51 AM

## 2023-06-02 NOTE — Group Note (Signed)
Date:  06/02/2023 Time:  9:24 PM  Group Topic/Focus:  Wrap-Up Group:   The focus of this group is to help patients review their daily goal of treatment and discuss progress on daily workbooks.    Participation Level:  Minimal  Participation Quality:  Appropriate  Affect:  Appropriate  Cognitive:  Appropriate  Insight: Appropriate  Engagement in Group:  None  Modes of Intervention:  Clarification  Additional Comments:     Maglione,Violett Hobbs E 06/02/2023, 9:24 PM

## 2023-06-02 NOTE — Progress Notes (Signed)
D- Patient alert and oriented x 4. Affect bright/mood anxious. Denies SI/ HI/ AVH although she does have concerns about those around her and "their agitation and anger". She states that it makes her anxious and paranoid. She requested PRN Hydroxyzine 50 mg for anxiety. She denies pain. Patient endorses depression and anxiety. She also voices concerns about Thyroid disease and the effects on her mental health. Order for at home medications verified for BCP. Her mother dropped them off and was administered po. She complained of constipation and MOM 30 ml administered for complaint. A- Scheduled and PRN medications administered to patient, per MD orders. Support and encouragement provided. Routine safety checks conducted every 15 minutes without incident.  Patient informed to notify staff with problems or concerns and verbalizes understanding. R- No adverse drug reactions noted.  Patient compliant with medications and treatment plan. Patient receptive, calm cooperative and interacts well with others on the unit.  Patient contracts for safety and  remains safe on the unit at this time.

## 2023-06-03 ENCOUNTER — Encounter: Payer: Self-pay | Admitting: Oncology

## 2023-06-03 DIAGNOSIS — F323 Major depressive disorder, single episode, severe with psychotic features: Secondary | ICD-10-CM | POA: Diagnosis not present

## 2023-06-03 LAB — GLUCOSE, CAPILLARY
Glucose-Capillary: 118 mg/dL — ABNORMAL HIGH (ref 70–99)
Glucose-Capillary: 92 mg/dL (ref 70–99)

## 2023-06-03 MED ORDER — ARIPIPRAZOLE 10 MG PO TABS
30.0000 mg | ORAL_TABLET | Freq: Every day | ORAL | Status: DC
  Administered 2023-06-04: 30 mg via ORAL
  Filled 2023-06-03: qty 3

## 2023-06-03 MED ORDER — ARIPIPRAZOLE 10 MG PO TABS
20.0000 mg | ORAL_TABLET | Freq: Once | ORAL | Status: AC
  Administered 2023-06-03: 20 mg via ORAL
  Filled 2023-06-03: qty 2

## 2023-06-03 NOTE — Progress Notes (Signed)
Patient presents with bright affect. Denies SI, HI, AVH. Medication compliant. Logical and coherent in conversation. Patient complains of constipation, stated MOM given earlier with a small relief, reports stomach bloated. Upon assessment patient abdomen is distended, firm, prune juice given. Encouraged water intake. Patient also complained of anxiety and toothache, prn given with good relief. Patient noted in dayroom watching television with peers. Minimal interaction with others. Encouragement and support provided. Safety checks maintained. Medications given as prescribed. Pt receptive and remains safe on unit with q 15 min checks.

## 2023-06-03 NOTE — Group Note (Unsigned)
Date:  06/03/2023 Time:  9:25 PM  Group Topic/Focus:  Wrap-Up Group:   The focus of this group is to help patients review their daily goal of treatment and discuss progress on daily workbooks.     Participation Level:  {BHH PARTICIPATION ZOXWR:60454}  Participation Quality:  {BHH PARTICIPATION QUALITY:22265}  Affect:  {BHH AFFECT:22266}  Cognitive:  {BHH COGNITIVE:22267}  Insight: {BHH Insight2:20797}  Engagement in Group:  {BHH ENGAGEMENT IN UJWJX:91478}  Modes of Intervention:  {BHH MODES OF INTERVENTION:22269}  Additional Comments:  ***  Kristina Li 06/03/2023, 9:25 PM

## 2023-06-03 NOTE — Group Note (Signed)
Recreation Therapy Group Note   Group Topic:Problem Solving  Group Date: 06/03/2023 Start Time: 1000 End Time: 1050 Facilitators: Rosina Lowenstein, LRT, CTRS Location:  Craft Room  Group Description: Life Boat. Patients were given the scenario that they are on a boat that is about to become shipwrecked, leaving them stranded on an [place]. They are asked to make a list of 15 different items that they want to take with them when they are stranded on the [place]. Patients are asked to rank their items from most important to least important, #1 being the most important and #15 being the least. Patients will work individually for the first round to come up with 15 items and then pair up with a peer(s) to condense their list and come up with one list of 15 items between the two of them. Patients or LRT will read aloud the 15 different items to the group after each round. LRT facilitated post-activity processing to discuss how this activity can be used in daily life post discharge.   Goal Area(s) Addressed:  Patient will identify priorities, wants and needs. Patient will communicate with LRT and peers. Patient will work collectively as a Administrator, Civil Service. Patient will work on Product manager.   Affect/Mood: Anxious and Flat   Participation Level: Active and Engaged   Participation Quality: Independent   Behavior: Apprehensive  and Calm   Speech/Thought Process: Coherent   Insight: Fair   Judgement: Fair    Modes of Intervention: Activity   Patient Response to Interventions:  Interested  and Receptive   Education Outcome:  Acknowledges education   Clinical Observations/Individualized Feedback: Kristina Li was mostly active in their participation of session activities and group discussion. Pt identified "jet ski" as an item that she will bring with her on the [place]. Pt came late to group and seemed to be reserved and cautious. Pt was paranoid and shared that staff "needed to keep a better  eye on things". Pt seemed to be irritated with some peers on the unit.    Plan: Continue to engage patient in RT group sessions 2-3x/week.   Rosina Lowenstein, LRT, CTRS 06/03/2023 11:45 AM

## 2023-06-03 NOTE — Group Note (Signed)
Date:  06/03/2023 Time:  4:53 PM  Group Topic/Focus:  Activity Group:  Focus of the group is to encourage patients to go outside in the courtyard and get some fresh air to assist with their mental and physical well-being.    Participation Level:  Active  Participation Quality:  Appropriate  Affect:  Appropriate  Cognitive:  Appropriate  Insight: Appropriate  Engagement in Group:  Engaged  Modes of Intervention:  Activity  Additional Comments:    Xayvion Shirah M Henning Ehle 06/03/2023, 4:53 PM  
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
## 2023-06-03 NOTE — Plan of Care (Signed)
  Problem: Education: Goal: Knowledge of General Education information will improve Description: Including pain rating scale, medication(s)/side effects and non-pharmacologic comfort measures Outcome: Progressing   Problem: Health Behavior/Discharge Planning: Goal: Ability to manage health-related needs will improve Outcome: Progressing   Problem: Clinical Measurements: Goal: Ability to maintain clinical measurements within normal limits will improve Outcome: Progressing Goal: Will remain free from infection Outcome: Progressing Goal: Diagnostic test results will improve Outcome: Progressing Goal: Respiratory complications will improve Outcome: Progressing Goal: Cardiovascular complication will be avoided Outcome: Progressing   Problem: Activity: Goal: Risk for activity intolerance will decrease Outcome: Progressing   Problem: Nutrition: Goal: Adequate nutrition will be maintained Outcome: Progressing   Problem: Coping: Goal: Level of anxiety will decrease Outcome: Progressing   Problem: Elimination: Goal: Will not experience complications related to bowel motility Outcome: Progressing Goal: Will not experience complications related to urinary retention Outcome: Progressing   Problem: Pain Managment: Goal: General experience of comfort will improve Outcome: Progressing   Problem: Safety: Goal: Ability to remain free from injury will improve Outcome: Progressing   Problem: Skin Integrity: Goal: Risk for impaired skin integrity will decrease Outcome: Progressing   Problem: Activity: Goal: Will verbalize the importance of balancing activity with adequate rest periods Outcome: Progressing   Problem: Education: Goal: Will be free of psychotic symptoms Outcome: Progressing Goal: Knowledge of the prescribed therapeutic regimen will improve Outcome: Progressing   Problem: Coping: Goal: Coping ability will improve Outcome: Progressing Goal: Will verbalize  feelings Outcome: Progressing   Problem: Health Behavior/Discharge Planning: Goal: Compliance with prescribed medication regimen will improve Outcome: Progressing   Problem: Nutritional: Goal: Ability to achieve adequate nutritional intake will improve Outcome: Progressing   Problem: Role Relationship: Goal: Ability to communicate needs accurately will improve Outcome: Progressing Goal: Ability to interact with others will improve Outcome: Progressing   Problem: Safety: Goal: Ability to redirect hostility and anger into socially appropriate behaviors will improve Outcome: Progressing Goal: Ability to remain free from injury will improve Outcome: Progressing   Problem: Self-Care: Goal: Ability to participate in self-care as condition permits will improve Outcome: Progressing   Problem: Self-Concept: Goal: Will verbalize positive feelings about self Outcome: Progressing   Problem: Education: Goal: Knowledge of [place] [facility] Education information/materials will improve Outcome: Progressing Goal: Emotional status will improve Outcome: Progressing Goal: Mental status will improve Outcome: Progressing Goal: Verbalization of understanding the information provided will improve Outcome: Progressing   Problem: Activity: Goal: Interest or engagement in activities will improve Outcome: Progressing Goal: Sleeping patterns will improve Outcome: Progressing   Problem: Coping: Goal: Ability to verbalize frustrations and anger appropriately will improve Outcome: Progressing Goal: Ability to demonstrate self-control will improve Outcome: Progressing   Problem: Health Behavior/Discharge Planning: Goal: Identification of resources available to assist in meeting health care needs will improve Outcome: Progressing Goal: Compliance with treatment plan for underlying cause of condition will improve Outcome: Progressing   Problem: Physical Regulation: Goal: Ability to  maintain clinical measurements within normal limits will improve Outcome: Progressing   Problem: Safety: Goal: Periods of time without injury will increase Outcome: Progressing   

## 2023-06-03 NOTE — Plan of Care (Signed)
  Problem: Nutrition: Goal: Adequate nutrition will be maintained Outcome: Progressing   Problem: Coping: Goal: Level of anxiety will decrease Outcome: Progressing   Problem: Pain Managment: Goal: General experience of comfort will improve Outcome: Not Progressing   Problem: Safety: Goal: Ability to remain free from injury will improve Outcome: Progressing   Problem: Skin Integrity: Goal: Risk for impaired skin integrity will decrease Outcome: Progressing   Problem: Education: Goal: Will be free of psychotic symptoms Outcome: Progressing Goal: Knowledge of the prescribed therapeutic regimen will improve Outcome: Progressing   Problem: Coping: Goal: Coping ability will improve Outcome: Progressing Goal: Will verbalize feelings Outcome: Progressing

## 2023-06-03 NOTE — [facility] Counselor (Signed)
CSW met with pt and DSS representative, Florence Canner. Pt shared about her current struggles with things going on in her apartment. She appears to be well connected with medical providers but not currently seeing anyone for mental health services. Pt shared that previously she was seen through [facility] but has not been connected with anyone over the past two years. Verlon Au and CSW spoke with pt about aftercare. She is open to referral within the [facility] area but also [facility]. Verlon Au and CSW explained that a provider in the same area that she lives in may be the best route as it would mean less chances of her missing her appointments when she relies on others for transportation. Pt voiced understanding.  No other concerns expressed. Contact ended without incident.   Verlon Au and CSW to work on follow up with providers in the area.  Vilma Meckel. Algis Greenhouse, MSW, LCSW, LCAS 06/03/2023 3:10 PM

## 2023-06-04 DIAGNOSIS — F323 Major depressive disorder, single episode, severe with psychotic features: Secondary | ICD-10-CM | POA: Diagnosis not present

## 2023-06-04 LAB — GLUCOSE, CAPILLARY
Glucose-Capillary: 93 mg/dL (ref 70–99)
Glucose-Capillary: 104 mg/dL — ABNORMAL HIGH (ref 70–99)

## 2023-06-04 MED ORDER — RISPERIDONE 1 MG PO TABS: 1.0000 mg | ORAL_TABLET | ORAL | Status: DC

## 2023-06-04 MED ORDER — ARIPIPRAZOLE 10 MG PO TABS
30.0000 mg | ORAL_TABLET | Freq: Every day | ORAL | Status: DC
  Administered 2023-06-05 – 2023-06-08 (×4): 30 mg via ORAL
  Filled 2023-06-04 (×4): qty 3

## 2023-06-04 MED ORDER — LIOTHYRONINE SODIUM 25 MCG PO TABS
25.0000 ug | ORAL_TABLET | Freq: Every day | ORAL | Status: DC
  Administered 2023-06-05 – 2023-06-08 (×4): 25 ug via ORAL
  Filled 2023-06-04 (×4): qty 1

## 2023-06-04 NOTE — Plan of Care (Signed)
  Problem: Activity: Goal: Will verbalize the importance of balancing activity with adequate rest periods Outcome: Progressing   Problem: Education: Goal: Will be free of psychotic symptoms Outcome: Progressing Goal: Knowledge of the prescribed therapeutic regimen will improve Outcome: Progressing   Problem: Coping: Goal: Coping ability will improve Outcome: Progressing Goal: Will verbalize feelings Outcome: Progressing   Problem: Health Behavior/Discharge Planning: Goal: Compliance with prescribed medication regimen will improve Outcome: Progressing   Problem: Nutritional: Goal: Ability to achieve adequate nutritional intake will improve Outcome: Progressing   

## 2023-06-04 NOTE — Progress Notes (Signed)
Refused dinner blood sugar check

## 2023-06-04 NOTE — Group Note (Signed)
Recreation Therapy Group Note   Group Topic:Healthy Support Systems  Group Date: 06/04/2023 Start Time: 1000 End Time: 1055 Facilitators: Rosina Lowenstein, LRT, CTRS Location:  Craft Room  Group Description: Straw Bridge.  Patients were given 10 plastic drinking straws and an equal length of masking tape. Using the materials provided, patients were instructed to build a free-standing bridge-like structure to suspend an everyday item (ex: deck of cards) off the floor or table surface. All materials were required to be used in Secondary school teacher. LRT facilitated post-activity discussion reviewing the importance of having strong and healthy support systems in our lives. LRT discussed how the people in our lives serve as the tape and the deck of cards we placed on top of our straw structure are the stressors we face in daily life. LRT and pts discussed what happens in our life when things get too heavy for [place], and we don't have strong supports outside of the hospital. Pt shared 2 of their healthy supports aloud in the group.   Goal Area(s) Addressed:  Patient will identify 2 healthy supports in their life. Patient will identify skills to successfully complete activity. Patient will identify correlation of this activity to life post-discharge.  Patient will work on Product manager.   Affect/Mood: Appropriate   Participation Level: Active and Engaged   Participation Quality: Independent   Behavior: Calm and Cooperative   Speech/Thought Process: Coherent   Insight: Good   Judgement: Good   Modes of Intervention: Activity   Patient Response to Interventions:  Attentive, Engaged, Interested , and Receptive   Education Outcome:  Acknowledges education   Clinical Observations/Individualized Feedback: Kristina Li was active in their participation of session activities and group discussion. Pt identified "my dog and my family" as her healthy support system. Pt interacted well with LRT and  peers duration of session.   Plan: Continue to engage patient in RT group sessions 2-3x/week.   Rosina Lowenstein, LRT, CTRS 06/04/2023 11:33 AM

## 2023-06-04 NOTE — Group Note (Signed)
Date:  06/04/2023 Time:  10:45 PM  Group Topic/Focus:  Healthy Communication:   The focus of this group is to discuss communication, barriers to communication, as well as healthy ways to communicate with others.    Participation Level:  Active  Participation Quality:  Appropriate and Attentive  Affect:  Appropriate  Cognitive:  Alert and Appropriate  Insight: Good  Engagement in Group:  Developing/Improving  Modes of Intervention:  Socialization and Support  Additional Comments:     [name] 06/04/2023, 10:45 PM

## 2023-06-04 NOTE — Progress Notes (Signed)
D: Patient alert and oriented, able to make needs known. Denies SI/HI, AVH at present. Denies pain at present. Patient goal today "to feel better." Rates depression 2/10, hopelessness 0/10, and anxiety 7/10. Patient reports energy level as good. She reports she slept good last night. Patient does not request any PRN medication at this time.   A: Scheduled medications administered to patient per MD order. Support and encouragement provided. Routine safety checks conducted every fifteen minutes. Patient informed to notify staff with problems or concerns. Frequent verbal contact made.   R: No adverse drug reactions noted. Patient contracts for safety at this time. Patient is compliant with medications and treatment plan. Patient receptive, calm and cooperative. Patient interacts with others appropriately on unit at present. Patient remains safe at present.
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
## 2023-06-04 NOTE — Progress Notes (Signed)
Pt is calm and cooperative, compliant with medications, denies SI Hi AVH.  Pt denies physical complaints.  Depression 2/10 and anxiety 7/10.  Pt stated she is looking forward to discharge.  Continued monitoring for safety.   06/04/23 0300  Psych Admission Type (Psych Patients Only)  Admission Status Voluntary  Psychosocial Assessment  Patient Complaints None  Eye Contact Fair  Facial Expression Animated  Affect Appropriate to circumstance  Speech Logical/coherent  Interaction Assertive  Motor Activity Slow  Appearance/Hygiene Unremarkable  Behavior Characteristics Cooperative  Mood Pleasant  Thought Process  Coherency WDL  Content Preoccupation  Delusions WDL  Perception WDL  Hallucination None reported or observed  Judgment Impaired  Confusion None  Danger to Self  Current suicidal ideation? Denies  Danger to Others  Danger to Others None reported or observed

## 2023-06-05 DIAGNOSIS — F323 Major depressive disorder, single episode, severe with psychotic features: Secondary | ICD-10-CM | POA: Diagnosis not present

## 2023-06-05 LAB — GLUCOSE, CAPILLARY
Glucose-Capillary: 77 mg/dL (ref 70–99)
Glucose-Capillary: 41 mg/dL — CL (ref 70–99)
Glucose-Capillary: 79 mg/dL (ref 70–99)

## 2023-06-05 LAB — LIPID PANEL
Cholesterol: 152 mg/dL (ref 0–200)
HDL: 56 mg/dL (ref >40)
LDL Cholesterol: 80 mg/dL (ref 0–99)
Total CHOL/HDL Ratio: 2.7 RATIO
Triglycerides: 81 mg/dL (ref <150)
VLDL: 16 mg/dL (ref 0–40)

## 2023-06-05 NOTE — Group Note (Signed)
Recreation Therapy Group Note   Group Topic:Leisure Education  Group Date: 06/05/2023 Start Time: 1000 End Time: 1105 Facilitators: Rosina Lowenstein, LRT, CTRS Location:  Day Room  Group Description: Leisure. Patients were given the option to choose from coloring, singing karaoke, or playing cards. LRT and pts discussed the meaning of leisure, the importance of participating in leisure during their free time/when they're outside of the hospital, as well as how our leisure interests can also serve as coping skills. Pt identified two leisure interests and shared with the group.   Goal Area(s) Addressed:  Patient will identify a current leisure interest.  Patient will learn the definition of "leisure". Patient will practice making a positive decision. Patient will have the opportunity to try a new leisure activity. Patient will communicate with peers and LRT.   Affect/Mood: Appropriate   Participation Level: Active and Engaged   Participation Quality: Independent   Behavior: Calm and Cooperative   Speech/Thought Process: Coherent   Insight: Good   Judgement: Good   Modes of Intervention: Activity   Patient Response to Interventions:  Attentive, Engaged, Interested , and Receptive   Education Outcome:  Acknowledges education   Clinical Observations/Individualized Feedback: Kristina Li was active in their participation of session activities and group discussion. Pt identified "listen to music, watch movies or go outside" as things that she does in her free time. Pt chose to color mandalas while in group. Pt interacted well with LRT and peers duration of session.   Plan: Continue to engage patient in RT group sessions 2-3x/week.   Rosina Lowenstein, LRT, CTRS 06/05/2023 11:51 AM

## 2023-06-05 NOTE — Progress Notes (Signed)
Hypoglycemic Event  CBG: 41  Treatment: 8 oz juice/soda  Symptoms: None  Follow-up CBG: Time: 1710 CBG Result: 79  Possible Reasons for Event: Unknown  Comments/MD notified: Herrick, DO notified via secure chat, no new orders were placed. Patient also had dinner prior to her blood sugar being re-checked.    Micha Dosanjh

## 2023-06-05 NOTE — Plan of Care (Signed)
D- Patient alert and oriented. Patient presented in a pleasant mood on assessment reporting that she slept good last night and had no complaints to voice to this Clinical research associate. Patient endorsed depression and anxiety, but stated that it's not from anything in particular. Patient denied SI, HI, AVH, and pain at this time. Patient's goal for today is to "feel better".  A- Scheduled medications administered to patient, per MD orders. Support and encouragement provided.  Routine safety checks conducted every 15 minutes.  Patient informed to notify staff with problems or concerns.  R- No adverse drug reactions noted. Patient contracts for safety at this time. Patient compliant with medications and treatment plan. Patient receptive, calm, and cooperative. Patient interacts well with others on the unit. Patient remains safe at this time.  Problem: Education: Goal: Knowledge of General Education information will improve Description: Including pain rating scale, medication(s)/side effects and non-pharmacologic comfort measures Outcome: Progressing   Problem: Health Behavior/Discharge Planning: Goal: Ability to manage health-related needs will improve Outcome: Progressing   Problem: Clinical Measurements: Goal: Ability to maintain clinical measurements within normal limits will improve Outcome: Progressing Goal: Will remain free from infection Outcome: Progressing Goal: Diagnostic test results will improve Outcome: Progressing Goal: Respiratory complications will improve Outcome: Progressing Goal: Cardiovascular complication will be avoided Outcome: Progressing   Problem: Activity: Goal: Risk for activity intolerance will decrease Outcome: Progressing   Problem: Nutrition: Goal: Adequate nutrition will be maintained Outcome: Progressing   Problem: Coping: Goal: Level of anxiety will decrease Outcome: Progressing   Problem: Elimination: Goal: Will not experience complications related to bowel  motility Outcome: Progressing Goal: Will not experience complications related to urinary retention Outcome: Progressing   Problem: Pain Managment: Goal: General experience of comfort will improve Outcome: Progressing   Problem: Safety: Goal: Ability to remain free from injury will improve Outcome: Progressing   Problem: Skin Integrity: Goal: Risk for impaired skin integrity will decrease Outcome: Progressing   Problem: Activity: Goal: Will verbalize the importance of balancing activity with adequate rest periods Outcome: Progressing   Problem: Education: Goal: Will be free of psychotic symptoms Outcome: Progressing Goal: Knowledge of the prescribed therapeutic regimen will improve Outcome: Progressing   Problem: Coping: Goal: Coping ability will improve Outcome: Progressing Goal: Will verbalize feelings Outcome: Progressing   Problem: Health Behavior/Discharge Planning: Goal: Compliance with prescribed medication regimen will improve Outcome: Progressing   Problem: Nutritional: Goal: Ability to achieve adequate nutritional intake will improve Outcome: Progressing   Problem: Role Relationship: Goal: Ability to communicate needs accurately will improve Outcome: Progressing Goal: Ability to interact with others will improve Outcome: Progressing   Problem: Safety: Goal: Ability to redirect hostility and anger into socially appropriate behaviors will improve Outcome: Progressing Goal: Ability to remain free from injury will improve Outcome: Progressing   Problem: Self-Care: Goal: Ability to participate in self-care as condition permits will improve Outcome: Progressing   Problem: Self-Concept: Goal: Will verbalize positive feelings about self Outcome: Progressing   Problem: Education: Goal: Knowledge of [facility] Education information/materials will improve Outcome: Progressing Goal: Emotional status will improve Outcome: Progressing Goal: Mental  status will improve Outcome: Progressing Goal: Verbalization of understanding the information provided will improve Outcome: Progressing   Problem: Activity: Goal: Interest or engagement in activities will improve Outcome: Progressing Goal: Sleeping patterns will improve Outcome: Progressing   Problem: Coping: Goal: Ability to verbalize frustrations and anger appropriately will improve Outcome: Progressing Goal: Ability to demonstrate self-control will improve Outcome: Progressing   Problem: Health Behavior/Discharge Planning: Goal:  Identification of resources available to assist in meeting health care needs will improve Outcome: Progressing Goal: Compliance with treatment plan for underlying cause of condition will improve Outcome: Progressing   Problem: Physical Regulation: Goal: Ability to maintain clinical measurements within normal limits will improve Outcome: Progressing   Problem: Safety: Goal: Periods of time without injury will increase Outcome: Progressing

## 2023-06-05 NOTE — Group Note (Signed)
Date:  06/05/2023 Time:  10:02 AM  Group Topic/Focus:  Goals Group:   The focus of this group is to help patients establish daily goals to achieve during treatment and discuss how the patient can incorporate goal setting into their daily lives to aide in recovery.    Participation Level:  Active  Participation Quality:  Appropriate  Affect:  Appropriate  Cognitive:  Appropriate  Insight: Appropriate  Engagement in Group:  Engaged  Modes of Intervention:  Discussion, Education, and Support  Additional Comments:    Wilford Corner 06/05/2023, 10:02 AM
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
## 2023-06-06 DIAGNOSIS — F323 Major depressive disorder, single episode, severe with psychotic features: Secondary | ICD-10-CM | POA: Diagnosis not present

## 2023-06-06 LAB — GLUCOSE, CAPILLARY
Glucose-Capillary: 105 mg/dL — ABNORMAL HIGH (ref 70–99)
Glucose-Capillary: 109 mg/dL — ABNORMAL HIGH (ref 70–99)
Glucose-Capillary: 86 mg/dL (ref 70–99)
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
## 2023-06-06 NOTE — Progress Notes (Signed)
The patient was more visible in the milieu and social with peers.  She accepted her medications as ordered and was pleasant, but anxious appearing. Kristina Li was focused on learning about her thyroid condition.  She requested and was provided hydroxyzine before bedtime.  BG results were improved.

## 2023-06-06 NOTE — Group Note (Signed)
Date:  06/06/2023 Time:  6:41 PM  Group Topic/Focus:  Self Care:   The focus of this group is to help patients understand the importance of self-care in order to improve or restore emotional, physical, spiritual, interpersonal, and financial health. Gratitude assertiveness.     Participation Level:  Active  Participation Quality:  Appropriate  Affect:  Appropriate  Cognitive:  Alert and Appropriate  Insight: Appropriate  Engagement in Group:  Engaged  Modes of Intervention:  Activity and Discussion  Additional Comments:    Doug Sou 06/06/2023, 6:41 PM

## 2023-06-06 NOTE — BH IP Treatment Plan (Signed)
Interdisciplinary Treatment and Diagnostic Plan Update  06/06/2023 Time of Session: 9:17 am Kristina Li MRN: [id number]  Principal Diagnosis: Major depressive disorder, single episode, severe, with psychosis (HCC)  Secondary Diagnoses: Principal Problem:   Major depressive disorder, single episode, severe, with psychosis (HCC)   Current Medications:  Current Facility-Administered Medications  Medication Dose Route Frequency Provider Last Rate Last Admin   acetaminophen (TYLENOL) tablet 650 mg  650 mg Oral Q6H PRN Charm Rings, NP   650 mg at 06/04/23 1755   alum & mag hydroxide-simeth (MAALOX/MYLANTA) 200-200-20 MG/5ML suspension 30 mL  30 mL Oral Q4H PRN Charm Rings, NP   30 mL at 06/03/23 2145   amitriptyline (ELAVIL) tablet 100 mg  100 mg Oral QHS Charm Rings, NP   100 mg at 06/05/23 2204   ARIPiprazole (ABILIFY) tablet 30 mg  30 mg Oral QPC breakfast Sarina Ill, DO   30 mg at 06/06/23 0845   dapagliflozin propanediol (FARXIGA) tablet 10 mg  10 mg Oral QAC breakfast Charm Rings, NP   10 mg at 06/06/23 0845   Drospirenone TABS 4 mg  1 tablet Oral Daily Charm Rings, NP   4 mg at 06/05/23 1115   feeding supplement (GLUCERNA SHAKE) (GLUCERNA SHAKE) liquid 237 mL  237 mL Oral TID BM Reggie Pile, MD   237 mL at 06/06/23 0846   fluticasone (FLONASE) 50 MCG/ACT nasal spray 2 spray  2 spray Each Nare Daily Charm Rings, NP   2 spray at 06/06/23 0848   hydrOXYzine (ATARAX) tablet 50 mg  50 mg Oral TID PRN Reggie Pile, MD   50 mg at 06/05/23 0658   ibuprofen (ADVIL) tablet 400 mg  400 mg Oral Q6H PRN Reggie Pile, MD   400 mg at 06/04/23 1755   levothyroxine (SYNTHROID) tablet 75 mcg  75 mcg Oral QAC breakfast Charm Rings, NP   75 mcg at 06/06/23 2025   liothyronine (CYTOMEL) tablet 25 mcg  25 mcg Oral Daily Sarina Ill, DO   25 mcg at 06/06/23 0845   lisinopril (ZESTRIL) tablet 10 mg  10 mg Oral Daily Charm Rings, NP   10 mg at  06/06/23 0845   loratadine (CLARITIN) tablet 10 mg  10 mg Oral Daily Charm Rings, NP   10 mg at 06/06/23 0845   magic mouthwash  5 mL Oral TID PRN Reggie Pile, MD   5 mL at 06/06/23 0848   magnesium hydroxide (MILK OF MAGNESIA) suspension 30 mL  30 mL Oral Daily PRN Charm Rings, NP   30 mL at 06/03/23 1329   metoprolol succinate (TOPROL-XL) 24 hr tablet 100 mg  100 mg Oral Daily Charm Rings, NP   100 mg at 06/06/23 0844   OLANZapine (ZYPREXA) tablet 10 mg  10 mg Oral Daily PRN Charm Rings, NP   10 mg at 06/05/23 1328   Or   OLANZapine (ZYPREXA) injection 10 mg  10 mg Intramuscular Daily PRN Charm Rings, NP       pantoprazole (PROTONIX) EC tablet 40 mg  40 mg Oral BID Charm Rings, NP   40 mg at 06/06/23 0846   rivaroxaban (XARELTO) tablet 20 mg  20 mg Oral Daily Charm Rings, NP   20 mg at 06/05/23 2204   rosuvastatin (CRESTOR) tablet 40 mg  40 mg Oral Daily Charm Rings, NP   40 mg at 06/06/23 0846   spironolactone (ALDACTONE)  tablet 25 mg  25 mg Oral Daily Charm Rings, NP   25 mg at 06/06/23 0845   PTA Medications: Medications Prior to Admission  Medication Sig Dispense Refill Last Dose   Accu-Chek Softclix Lancets lancets USE TO CHECK SUGARS TWICE DAILY 100 each 1    amitriptyline (ELAVIL) 100 MG tablet Take 1 tablet (100 mg total) by mouth at bedtime. 90 tablet 3    budesonide-formoterol (SYMBICORT) 80-4.5 MCG/ACT inhaler Inhale 2 puffs into the lungs 2 (two) times daily.      cetirizine (ZYRTEC ALLERGY) 10 MG tablet Take 1 tablet (10 mg total) by mouth daily. 30 tablet 2    dapagliflozin propanediol (FARXIGA) 10 MG TABS tablet Take 1 tablet (10 mg total) by mouth daily before breakfast. 90 tablet 3    fluticasone (FLONASE) 50 MCG/ACT nasal spray PLACE 2 SPRAYS INTO BOTH NOSTRILS ONCE DAILY 16 g 1    GARLIC PO Take 1 tablet by mouth daily.      glucose blood (ACCU-CHEK GUIDE) test strip USE TO CHECK SUGAR TWICE A DAY AS DIRECTED 100 each 1    hydrOXYzine  (ATARAX) 25 MG tablet Take 25 mg by mouth 3 (three) times daily as needed for anxiety.      liothyronine (CYTOMEL) 5 MCG tablet TAKE 1 TABLET BY MOUTH ONCE DAILY 30 tablet 3    lisinopril (ZESTRIL) 10 MG tablet TAKE 1 TABLET BY MOUTH ONCE DAILY 90 tablet 1    metoprolol succinate (TOPROL-XL) 100 MG 24 hr tablet TAKE 1 TABLET BY MOUTH ONCE DAILY 90 tablet 1    Multiple Vitamins-Minerals (WOMENS BONE HEALTH PO) Take 1 tablet by mouth daily.      ondansetron (ZOFRAN) 4 MG tablet Take 1 tablet (4 mg total) by mouth every 8 (eight) hours as needed for nausea or vomiting. 30 tablet 0    pantoprazole (PROTONIX) 40 MG tablet TAKE 1 TABLET BY MOUTH TWICE DAILY 180 tablet 1    rivaroxaban (XARELTO) 20 MG TABS tablet TAKE 1 TABLET BY MOUTH ONCE DAILY *NEED APPOINTMENT FOR FURTHER FILLS* 90 tablet 0    rosuvastatin (CRESTOR) 40 MG tablet TAKE 1 TABLET BY MOUTH ONCE DAILY 90 tablet 1    SLYND 4 MG TABS TAKE 1 TABLET BY MOUTH ONCE DAILY 30 tablet 1    spironolactone (ALDACTONE) 25 MG tablet TAKE 1 TABLET BY MOUTH ONCE DAILY ONCE EVERY MORNING (Patient taking differently: Take 25 mg by mouth daily.) 90 tablet 0    SUMAtriptan (IMITREX) 25 MG tablet Take 25 mg by mouth every 2 (two) hours as needed for migraine. May repeat in 2 hours if headache persists or recurs.      SYNTHROID 75 MCG tablet TAKE 1 TABLET BY MOUTH ONCE DAILY ON AN EMPTY STOMACH. WAIT 30 MINUTES BEFORE TAKING OTHER MEDS. (Patient taking differently: Take 75 mcg by mouth daily before breakfast.) 30 tablet 3    Vitamin D, Ergocalciferol, (DRISDOL) 1.25 MG (50000 UNIT) CAPS capsule TAKE 1 CAPSULE BY MOUTH ONCE A WEEK 4 capsule 11     Patient Stressors: Other: Delusions and fears.    Patient Strengths: Capable of independent living  Forensic psychologist fund of knowledge  Supportive family/friends   Treatment Modalities: Medication Management, Group therapy, Case management,  1 to 1 session with clinician, Psychoeducation, Recreational  therapy.   Physician Treatment Plan for Primary Diagnosis: Major depressive disorder, single episode, severe, with psychosis (HCC) Long Term Goal(s): Improvement in symptoms so as ready for discharge  Short Term Goals: Ability to identify and develop effective coping behaviors will improve Ability to maintain clinical measurements within normal limits will improve Ability to identify changes in lifestyle to reduce recurrence of condition will improve Ability to verbalize feelings will improve  Medication Management: Evaluate patient's response, side effects, and tolerance of medication regimen.  Therapeutic Interventions: 1 to 1 sessions, Unit Group sessions and Medication administration.  Evaluation of Outcomes: Progressing  Physician Treatment Plan for Secondary Diagnosis: Principal Problem:   Major depressive disorder, single episode, severe, with psychosis (HCC)  Long Term Goal(s): Improvement in symptoms so as ready for discharge   Short Term Goals: Ability to identify and develop effective coping behaviors will improve Ability to maintain clinical measurements within normal limits will improve Ability to identify changes in lifestyle to reduce recurrence of condition will improve Ability to verbalize feelings will improve     Medication Management: Evaluate patient's response, side effects, and tolerance of medication regimen.  Therapeutic Interventions: 1 to 1 sessions, Unit Group sessions and Medication administration.  Evaluation of Outcomes: Progressing   RN Treatment Plan for Primary Diagnosis: Major depressive disorder, single episode, severe, with psychosis (HCC) Long Term Goal(s): Knowledge of disease and therapeutic regimen to maintain health will improve  Short Term Goals: Ability to remain free from injury will improve, Ability to verbalize frustration and anger appropriately will improve, Ability to demonstrate self-control, Ability to participate in decision  making will improve, Ability to verbalize feelings will improve, Ability to identify and develop effective coping behaviors will improve, and Compliance with prescribed medications will improve  Medication Management: RN will administer medications as ordered by provider, will assess and evaluate patient's response and provide education to patient for prescribed medication. RN will report any adverse and/or side effects to prescribing provider.  Therapeutic Interventions: 1 on 1 counseling sessions, Psychoeducation, Medication administration, Evaluate responses to treatment, Monitor vital signs and CBGs as ordered, Perform/monitor CIWA, COWS, AIMS and Fall Risk screenings as ordered, Perform wound care treatments as ordered.  Evaluation of Outcomes: Progressing   LCSW Treatment Plan for Primary Diagnosis: Major depressive disorder, single episode, severe, with psychosis (HCC) Long Term Goal(s): Safe transition to appropriate next level of care at discharge, Engage patient in therapeutic group addressing interpersonal concerns.  Short Term Goals: Engage patient in aftercare planning with referrals and resources, Increase social support, Increase ability to appropriately verbalize feelings, Increase emotional regulation, Facilitate acceptance of mental health diagnosis and concerns, Facilitate patient progression through stages of change regarding substance use diagnoses and concerns, Identify triggers associated with mental health/substance abuse issues, and Increase skills for wellness and recovery  Therapeutic Interventions: Assess for all discharge needs, 1 to 1 time with Social worker, Explore available resources and support systems, Assess for adequacy in community support network, Educate family and significant other(s) on suicide prevention, Complete Psychosocial Assessment, Interpersonal group therapy.  Evaluation of Outcomes: Progressing   Progress in Treatment: Attending groups:  Yes. Participating in groups: Yes. Taking medication as prescribed: Yes. Toleration medication: Yes. Family/Significant other contact made: No, will contact:  patient declined Patient understands diagnosis: Yes. Discussing patient identified problems/goals with staff: Yes. Medical problems stabilized or resolved: Yes. Denies suicidal/homicidal ideation: Yes. Issues/concerns per patient self-inventory: No. Other: none  New problem(s) identified: No, Describe:  none  New Short Term/Long Term Goal(s): elimination of symptoms of psychosis, medication management for mood stabilization; elimination of SI thoughts; development of comprehensive mental wellness/sobriety plan. Update 6/29: none at this time  Patient Goals:  "something  for paranoia and I've been somewhat delusional and hallucinating" Update 6/29: none at this time  Discharge Plan or Barriers: CSW to assist in the development of appropriate discharge plans. Update 6/29: none at this time  Reason for Continuation of Hospitalization: Anxiety Delusions  Depression Hallucinations Medication stabilization  Estimated Length of Stay: 1-7 days Update 6/29: TBD  Last 3 [place] Suicide Severity Risk Score: Flowsheet Row Admission (Current) from 05/31/2023 in [facility] INPATIENT BEHAVIORAL MEDICINE Most recent reading at 05/31/2023  8:30 PM ED from 05/31/2023 in [facility] Emergency Department at [facility] Most recent reading at 05/31/2023  7:32 AM ED from 03/14/2023 in [facility] Emergency Department at [facility] Most recent reading at 03/14/2023 11:08 AM  C-SSRS RISK CATEGORY No Risk No Risk No Risk       Last PHQ 2/9 Scores:    10/14/2022    3:39 PM 12/25/2021   10:42 AM 08/13/2016   12:14 PM  Depression screen PHQ 2/9  Decreased Interest 0 2 0  Down, Depressed, Hopeless 0 1 0  PHQ - 2 Score 0 3 0  Altered sleeping  0   Tired, decreased energy  3   Change in appetite  2   Feeling bad or failure about yourself   0    Trouble concentrating  2   Moving slowly or fidgety/restless  2   Suicidal thoughts  0   PHQ-9 Score  12   Difficult doing work/chores  Somewhat difficult     Scribe for Treatment Team: Marshell Levan, LCSW 06/06/2023 9:17 AM

## 2023-06-06 NOTE — Progress Notes (Signed)
The patient rested in bed for the evening, but was pleasant upon contact and complaint with her medications.  Kristina Li denied thoughts of self-harm and verbally agreed to approach staff with suicidal thoughts.  The patient spent an hour listening to music in the day room from midnight until 0100.  She ate two servings of apple sauce after her BG level measured 86.  Mood was anxious and the patient appeared somewhat depressed.  No unsafe or unusual behaviors noted.

## 2023-06-06 NOTE — Group Note (Signed)
LCSW Group Therapy Note  Group Date: 06/06/2023 Start Time: 1315 End Time: 1355   Type of Therapy and Topic:  Group Therapy -  Coping Skills  Participation Level:  Active   Description of Group The focus of this group was to determine what unhealthy coping techniques typically are used by group members and what healthy coping techniques would be helpful in coping with various problems. Patients were guided in becoming aware of the differences between healthy and unhealthy coping techniques. Patients were asked to identify 2-3 healthy coping skills they would like to learn to use more effectively.  Therapeutic Goals Patients learned that coping is what human beings do all day long to deal with various situations in their lives Patients defined and discussed healthy vs unhealthy coping techniques Patients identified their preferred coping techniques and identified whether these were healthy or unhealthy Patients determined 2-3 healthy coping skills they would like to become more familiar with and use more often. Patients provided support and ideas to each other   Summary of Patient Progress:  The patient attended group. The patient stated that she has a support dog and they go to the movies together. The patient stated that she would like to try walking barefoot on the grass as a grounding technique.     Marshell Levan, LCSWA 06/06/2023  2:33 PM

## 2023-06-06 NOTE — Group Note (Signed)
Date:  06/06/2023 Time:  10:53 AM  Group Topic/Focus:  Music Therapy- Outside group time, working and building team skills by playing basketball  and other group activities.    Participation Level:  Active  Participation Quality:  Appropriate  Affect:  Appropriate  Cognitive:  Alert and Appropriate  Insight: Appropriate  Engagement in Group:  Engaged  Modes of Intervention:  Activity  Additional Comments:    Doug Sou 06/06/2023, 10:53 AM

## 2023-06-06 NOTE — Plan of Care (Signed)
D- Patient alert and oriented x 4. Affect flat/mood congruent . Denies SI/ HI/ AVH. Patient denies pain. Patient endorses anxiety. No signs or symptoms of hypo or hyperglycemia. She is requesting to check her debit card to make sure "no one is using it"- paranoia inferred. She complained of anxiety earlier in the day and was redirect to avoid overstimulation. PRN Hydroxyzine 25 mg administered with fair results.  A- Scheduled medications and PRN administered to patient, per MD orders. Support and encouragement provided.  Routine safety checks conducted every 15 minutes without incident.  Patient informed to notify staff with problems or concerns and verbalizes understanding. R- No adverse drug reactions noted.  Patient compliant with medications and treatment plan. Patient receptive, calm cooperative and interacts well with others on the unit.  Patient contracts for safety and  remains safe on the unit at this time.

## 2023-06-06 NOTE — Group Note (Unsigned)
Date:  06/06/2023 Time:  10:26 PM  Group Topic/Focus:  Wrap-Up Group:   The focus of this group is to help patients review their daily goal of treatment and discuss progress on daily workbooks.     Participation Level:  {BHH PARTICIPATION LEVEL:22264}  Participation Quality:  {BHH PARTICIPATION QUALITY:22265}  Affect:  {BHH AFFECT:22266}  Cognitive:  {BHH COGNITIVE:22267}  Insight: {BHH Insight2:20797}  Engagement in Group:  {BHH ENGAGEMENT IN GROUP:22268}  Modes of Intervention:  {BHH MODES OF INTERVENTION:22269}  Additional Comments:  ***  Maglione,Rhonin Trott E 06/06/2023, 10:26 PM  

## 2023-06-07 DIAGNOSIS — F323 Major depressive disorder, single episode, severe with psychotic features: Secondary | ICD-10-CM | POA: Diagnosis not present

## 2023-06-07 LAB — GLUCOSE, CAPILLARY
Glucose-Capillary: 108 mg/dL — ABNORMAL HIGH (ref 70–99)
Glucose-Capillary: 98 mg/dL (ref 70–99)
Glucose-Capillary: 85 mg/dL (ref 70–99)
Glucose-Capillary: 72 mg/dL (ref 70–99)

## 2023-06-07 NOTE — Plan of Care (Signed)

## 2023-06-07 NOTE — Group Note (Signed)
Date:  06/07/2023 Time:  3:50 PM  Group Topic/Focus:  Activity Group:  The purpose of this group is to encourage patients to come outside to the courtyard to get some fresh air for the support of their physical and mental wellbeing.    Participation Level:  Active  Participation Quality:  Appropriate  Affect:  Appropriate  Cognitive:  Appropriate  Insight: Appropriate  Engagement in Group:  Engaged  Modes of Intervention:  Activity  Additional Comments:    [name] Torsha Lemus 06/07/2023, 3:50 PM

## 2023-06-07 NOTE — Group Note (Signed)
Date:  06/07/2023 Time:  10:09 AM  Group Topic/Focus:  Activity Group:  The focus of this group is to encourage patients to come outside to the courtyard to assist with their physical and mental wellbeing.    Participation Level:  Active  Participation Quality:  Appropriate  Affect:  Appropriate  Cognitive:  Appropriate  Insight: Appropriate  Engagement in Group:  Engaged  Modes of Intervention:  Activity  Additional Comments:    Zyanya Glaza M Cheyne Boulden 06/07/2023, 10:09 AM  

## 2023-06-07 NOTE — Progress Notes (Signed)
   06/07/23 0815  Psych Admission Type (Psych Patients Only)  Admission Status Voluntary  Psychosocial Assessment  Patient Complaints Anxiety  Eye Contact Fair  Facial Expression Anxious  Affect Anxious  Speech Soft  Interaction Cautious  Motor Activity Slow  Appearance/Hygiene Unremarkable  Behavior Characteristics Appropriate to situation  Mood Pleasant  Thought Process  Coherency WDL  Content WDL  Delusions None reported or observed  Perception WDL  Hallucination None reported or observed  Judgment WDL  Confusion None  Danger to Self  Current suicidal ideation? Denies  Agreement Not to Harm Self Yes  Description of Agreement Verbal  Danger to Others  Danger to Others None reported or observed   Pt is appropriate and in milieu frequently. Pt has multiple conversation with mother on phone. Pt is adherent with scheduled medications. No signs of distress or injury. Staff will continue to monitor q15 minutes.
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
## 2023-06-08 ENCOUNTER — Encounter: Payer: Self-pay | Admitting: Oncology

## 2023-06-08 DIAGNOSIS — F323 Major depressive disorder, single episode, severe with psychotic features: Secondary | ICD-10-CM | POA: Diagnosis not present

## 2023-06-08 LAB — GLUCOSE, CAPILLARY
Glucose-Capillary: 128 mg/dL — ABNORMAL HIGH (ref 70–99)
Glucose-Capillary: 117 mg/dL — ABNORMAL HIGH (ref 70–99)
Glucose-Capillary: 102 mg/dL — ABNORMAL HIGH (ref 70–99)

## 2023-06-08 MED ORDER — ARIPIPRAZOLE 30 MG PO TABS: 30.0000 mg | ORAL_TABLET | Freq: Every day | ORAL | 3 refills | Status: DC

## 2023-06-08 MED ORDER — LEVOTHYROXINE SODIUM 75 MCG PO TABS: 75.0000 ug | ORAL_TABLET | Freq: Every day | ORAL | 3 refills | Status: DC

## 2023-06-08 MED ORDER — LIOTHYRONINE SODIUM 25 MCG PO TABS: 25.0000 ug | ORAL_TABLET | Freq: Every day | ORAL | 3 refills | Status: DC

## 2023-06-08 NOTE — Group Note (Signed)
[facility] LCSW Group Therapy Note    Group Date: 06/08/2023 Start Time: 1300 End Time: 1350  Type of Therapy and Topic:  Group Therapy:  Overcoming Obstacles  Participation Level:  [facility] PARTICIPATION LEVEL: Did Not Attend  Mood:  Description of Group:   In this group patients will be encouraged to explore what they see as obstacles to their own wellness and recovery. They will be guided to discuss their thoughts, feelings, and behaviors related to these obstacles. The group will process together ways to cope with barriers, with attention given to specific choices patients can make. Each patient will be challenged to identify changes they are motivated to make in order to overcome their obstacles. This group will be process-oriented, with patients participating in exploration of their own experiences as well as giving and receiving support and challenge from other group members.  Therapeutic Goals: 1. Patient will identify personal and current obstacles as they relate to admission. 2. Patient will identify barriers that currently interfere with their wellness or overcoming obstacles.  3. Patient will identify feelings, thought process and behaviors related to these barriers. 4. Patient will identify two changes they are willing to make to overcome these obstacles:    Summary of Patient Progress X   Therapeutic Modalities:   Cognitive Behavioral Therapy Solution Focused Therapy Motivational Interviewing Relapse Prevention Therapy   Glenis Smoker, LCSW

## 2023-06-08 NOTE — Group Note (Unsigned)
Date:  06/08/2023 Time:  6:54 AM  Group Topic/Focus:  Wrap-Up Group:   The focus of this group is to help patients review their daily goal of treatment and discuss progress on daily workbooks.     Participation Level:  {B[facility] PARTICIPATION ZOXWR:60454}  Participation Quality:  {B[facility] PARTICIPATION QUALITY:22265}  Affect:  {BHH AFFECT:22266}  Cognitive:  {B[facility] COGNITIVE:22267}  Insight: {BHH Insight2:20797}  Engagement in Group:  {B[facility] ENGAGEMENT IN UJWJX:91478}  Modes of Intervention:  {BHH MODES OF INTERVENTION:22269}  Additional Comments:  ***  Maglione,Shanin Szymanowski E 06/08/2023, 6:54 AM

## 2023-06-08 NOTE — [facility] Suicide Risk Assessment (Signed)
[facility] Discharge Suicide Risk Assessment   Principal Problem: Major depressive disorder, single episode, severe, with psychosis (HCC) Discharge Diagnoses: Principal Problem:   Major depressive disorder, single episode, severe, with psychosis (HCC)   Total Time spent with patient: 1 hour  Musculoskeletal: Strength & Muscle Tone: within normal limits Gait & Station: normal Patient leans: N/A  Psychiatric Specialty Exam  Presentation  General Appearance:  Appropriate for Environment  Eye Contact: Fair  Speech: Clear and Coherent  Speech Volume: Normal  Handedness: Right   Mood and Affect  Mood: Depressed  Duration of Depression Symptoms: No data recorded Affect: Flat; Depressed; Blunt   Thought Process  Thought Processes: Coherent  Descriptions of Associations:Intact  Orientation:Full (Time, Place and Person)  Thought Content:WDL  History of Schizophrenia/Schizoaffective disorder:Yes  Duration of Psychotic Symptoms:Greater than six months  Hallucinations:No data recorded Ideas of Reference:Paranoia  Suicidal Thoughts:No data recorded Homicidal Thoughts:No data recorded  Sensorium  Memory: Immediate Good; Recent Good; Remote Good  Judgment: Fair  Insight: Fair   Chartered certified accountant: Fair  Attention Span: Fair  Recall: [employer] of Knowledge: Fair  Language: Fair   Psychomotor Activity  Psychomotor Activity:No data recorded  Assets  Assets: Communication Skills; Desire for Improvement; Physical Health; Resilience; Financial Resources/Insurance   Sleep  Sleep:No data recorded   Blood pressure 119/80, pulse (!) 103, temperature 98.9 F (37.2 C), temperature source Oral, resp. rate 18, height 5\' 6"  (1.676 m), weight 91 kg, SpO2 97 %. Body mass index is 32.38 kg/m.  Mental Status Per Nursing Assessment::   On Admission:  NA  Demographic Factors:  NA  Loss Factors: NA  Historical  Factors: NA  Risk Reduction Factors:   Positive social support, Positive therapeutic relationship, and Positive coping skills or problem solving skills  Continued Clinical Symptoms:  Depression:   Anhedonia  Cognitive Features That Contribute To Risk:  None    Suicide Risk:  Minimal: No identifiable suicidal ideation.  Patients presenting with no risk factors but with morbid ruminations; may be classified as minimal risk based on the severity of the depressive symptoms    Plan Of Care/Follow-up recommendations: [facility]   Sarina Ill, DO 06/08/2023, 10:48 AM

## 2023-06-08 NOTE — Group Note (Signed)
Recreation Therapy Group Note   Group Topic:Goal Setting  Group Date: 06/08/2023 Start Time: 1000 End Time: 1115 Facilitators: Clinton Gallant, CTRS Location:  Craft Room  Group Description: Vision Board. Patients were given many different magazines, a glue stick, markers, and a piece of cardstock paper. LRT and pts discussed the importance of having goals in life. LRT and pts discussed the difference between short-term and long-term goals, as well as what a SMART goal is. LRT encouraged pts to create a vision board, with images they picked and then cut out with safety scissors from the magazine, for themselves, that capture their short and long-term goals. LRT encouraged pts to show and explain their vision board to the group. LRT offered to laminate vision board once dry and complete.   Goal Area(s) Addressed:  Patient will gain knowledge of short vs. long term goals.  Patient will identify goals for themselves. Patient will practice setting SMART goals. Patient will verbalize their goals to LRT and peers.   Affect/Mood: Appropriate   Participation Level: Active and Engaged   Participation Quality: Independent   Behavior: Appropriate, Calm, and Cooperative   Speech/Thought Process: Coherent   Insight: Good   Judgement: Good   Modes of Intervention: Art and Activity   Patient Response to Interventions:  Attentive, Engaged, Interested , and Receptive   Education Outcome:  Acknowledges education   Clinical Observations/Individualized Feedback: Kristina Li was active in their participation of session activities and group discussion. Pt identified "I want new clothes and shoes. I am also getting older so I want to take better care of my skin." Pt interacted well with LRT and peers duration of session.   Plan: Continue to engage patient in RT group sessions 2-3x/week.   Rosina Lowenstein, LRT, CTRS 06/08/2023 11:51 AM
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
## 2023-06-08 NOTE — Progress Notes (Signed)
D- Patient alert and oriented. Patient presented in a pleasant mood on assessment reporting that she slept fair last night and had no complaints to voice to this Clinical research associate. Patient endorsed slight depression and anxiety, stating "the medication is helping with it a lot. I can feel a big difference". Patient denied SI, HI, AVH, and pain at this time. Patient had no stated goals for today.  A- Scheduled medications administered to patient, per MD orders. Support and encouragement provided. Routine safety checks conducted every 15 minutes. Patient informed to notify staff with problems or concerns.  R- No adverse drug reactions noted. Patient contracts for safety at this time. Patient compliant with medications and treatment plan. Patient receptive, calm, and cooperative. Patient interacts well with others on the unit. Patient remains safe at this time.

## 2023-06-08 NOTE — Plan of Care (Signed)
Problem: Education: Goal: Knowledge of General Education information will improve Description: Including pain rating scale, medication(s)/side effects and non-pharmacologic comfort measures Outcome: Adequate for Discharge   Problem: Health Behavior/Discharge Planning: Goal: Ability to manage health-related needs will improve Outcome: Adequate for Discharge   Problem: Clinical Measurements: Goal: Ability to maintain clinical measurements within normal limits will improve Outcome: Adequate for Discharge Goal: Will remain free from infection Outcome: Adequate for Discharge Goal: Diagnostic test results will improve Outcome: Adequate for Discharge Goal: Respiratory complications will improve Outcome: Adequate for Discharge Goal: Cardiovascular complication will be avoided Outcome: Adequate for Discharge   Problem: Activity: Goal: Risk for activity intolerance will decrease Outcome: Adequate for Discharge   Problem: Nutrition: Goal: Adequate nutrition will be maintained Outcome: Adequate for Discharge   Problem: Coping: Goal: Level of anxiety will decrease Outcome: Adequate for Discharge   Problem: Elimination: Goal: Will not experience complications related to bowel motility Outcome: Adequate for Discharge Goal: Will not experience complications related to urinary retention Outcome: Adequate for Discharge   Problem: Pain Managment: Goal: General experience of comfort will improve Outcome: Adequate for Discharge   Problem: Safety: Goal: Ability to remain free from injury will improve Outcome: Adequate for Discharge   Problem: Skin Integrity: Goal: Risk for impaired skin integrity will decrease Outcome: Adequate for Discharge   Problem: Activity: Goal: Will verbalize the importance of balancing activity with adequate rest periods Outcome: Adequate for Discharge   Problem: Education: Goal: Will be free of psychotic symptoms Outcome: Adequate for Discharge Goal:  Knowledge of the prescribed therapeutic regimen will improve Outcome: Adequate for Discharge   Problem: Coping: Goal: Coping ability will improve Outcome: Adequate for Discharge Goal: Will verbalize feelings Outcome: Adequate for Discharge   Problem: Health Behavior/Discharge Planning: Goal: Compliance with prescribed medication regimen will improve Outcome: Adequate for Discharge   Problem: Nutritional: Goal: Ability to achieve adequate nutritional intake will improve Outcome: Adequate for Discharge   Problem: Role Relationship: Goal: Ability to communicate needs accurately will improve Outcome: Adequate for Discharge Goal: Ability to interact with others will improve Outcome: Adequate for Discharge   Problem: Safety: Goal: Ability to redirect hostility and anger into socially appropriate behaviors will improve Outcome: Adequate for Discharge Goal: Ability to remain free from injury will improve Outcome: Adequate for Discharge   Problem: Self-Care: Goal: Ability to participate in self-care as condition permits will improve Outcome: Adequate for Discharge   Problem: Self-Concept: Goal: Will verbalize positive feelings about self Outcome: Adequate for Discharge   Problem: Education: Goal: Knowledge of [place] [facility] Education information/materials will improve Outcome: Adequate for Discharge Goal: Emotional status will improve Outcome: Adequate for Discharge Goal: Mental status will improve Outcome: Adequate for Discharge Goal: Verbalization of understanding the information provided will improve Outcome: Adequate for Discharge   Problem: Activity: Goal: Interest or engagement in activities will improve Outcome: Adequate for Discharge Goal: Sleeping patterns will improve Outcome: Adequate for Discharge   Problem: Coping: Goal: Ability to verbalize frustrations and anger appropriately will improve Outcome: Adequate for Discharge Goal: Ability to demonstrate  self-control will improve Outcome: Adequate for Discharge   Problem: Health Behavior/Discharge Planning: Goal: Identification of resources available to assist in meeting health care needs will improve Outcome: Adequate for Discharge Goal: Compliance with treatment plan for underlying cause of condition will improve Outcome: Adequate for Discharge   Problem: Physical Regulation: Goal: Ability to maintain clinical measurements within normal limits will improve Outcome: Adequate for Discharge   Problem: Safety: Goal: Periods of time without   injury will increase Outcome: Adequate for Discharge   

## 2023-06-08 NOTE — Progress Notes (Signed)
The patient attended group, quietly read in her room, and was pleasant with staff/peers.  She appeared intermittently anxious and endorsed an acute stomach ache.  Kristina Li was provided PRN Maalox with good results.  Overall improvements in mood and reality-based thinking evident.  Medications accepted as ordered and suicidal ideation denied.  No significant issues to note.

## 2023-06-08 NOTE — Progress Notes (Signed)
Patient ID: Kristina Li, female   DOB: Jan 26, 1981, 42 y.o.   MRN: [id number]  Discharge Note:  Patient denies SI/HI/AVH at this time. Discharge instructions, AVS, prescriptions, and transition record gone over with patient. Patient given a copy of her Suicide Safety Plan. Patient agrees to comply with medication management, follow-up visit, and outpatient therapy. Patient belongings returned to patient. Patient questions and concerns addressed and answered. Patient ambulatory off unit. Patient discharged to home with her Mother.
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
## 2023-06-09 ENCOUNTER — Ambulatory Visit (INDEPENDENT_AMBULATORY_CARE_PROVIDER_SITE_OTHER): Payer: 59 | Admitting: Nurse Practitioner

## 2023-06-09 ENCOUNTER — Other Ambulatory Visit: Payer: Self-pay | Admitting: Psychiatry

## 2023-06-09 ENCOUNTER — Encounter (INDEPENDENT_AMBULATORY_CARE_PROVIDER_SITE_OTHER): Payer: 59

## 2023-06-09 ENCOUNTER — Other Ambulatory Visit (INDEPENDENT_AMBULATORY_CARE_PROVIDER_SITE_OTHER): Payer: Self-pay | Admitting: Nurse Practitioner

## 2023-06-09 DIAGNOSIS — M79604 Pain in right leg: Secondary | ICD-10-CM

## 2023-06-11 ENCOUNTER — Encounter: Payer: Self-pay | Admitting: Oncology

## 2023-06-12 ENCOUNTER — Encounter: Payer: Self-pay | Admitting: Oncology

## 2023-06-16 ENCOUNTER — Telehealth: Payer: Self-pay

## 2023-06-16 NOTE — Telephone Encounter (Signed)
Pt LM asking for call back.

## 2023-06-17 ENCOUNTER — Encounter: Payer: Self-pay | Admitting: Oncology

## 2023-06-17 NOTE — Telephone Encounter (Signed)
Spoke with patient who stated she does not have any needs at this time.

## 2023-06-18 ENCOUNTER — Encounter: Payer: Self-pay | Admitting: Oncology

## 2023-06-18 ENCOUNTER — Ambulatory Visit: Payer: MEDICAID | Admitting: Cardiology

## 2023-06-22 ENCOUNTER — Ambulatory Visit: Admission: RE | Admit: 2023-06-22 | Payer: MEDICAID | Source: Ambulatory Visit

## 2023-06-23 ENCOUNTER — Telehealth: Payer: Self-pay | Admitting: Nurse Practitioner

## 2023-06-23 NOTE — Telephone Encounter (Signed)
Spoke with patient who verified DOB and was informed that she does not have any future appointments but was a NO SHOW for an appt on 7/11. Patient declined rescheduling at this time and was informed to contact office if a future appt is needed.

## 2023-06-23 NOTE — Telephone Encounter (Signed)
Patient left VM wanting to know when her next appt is. I don't see that she has any future appts scheduled with [place].

## 2023-06-24 ENCOUNTER — Telehealth: Payer: Self-pay

## 2023-06-24 NOTE — Telephone Encounter (Signed)
Diona Fanti APS contacted office requesting call back in reference to patient. Spoke with Verlon Au and informed that patient has been attending visits and understanding visits, APS verbalized understanding.

## 2023-07-01 ENCOUNTER — Other Ambulatory Visit: Payer: Self-pay | Admitting: Cardiovascular Disease

## 2023-07-13 ENCOUNTER — Other Ambulatory Visit: Payer: Self-pay

## 2023-07-13 ENCOUNTER — Emergency Department: Payer: MEDICAID

## 2023-07-13 ENCOUNTER — Ambulatory Visit: Payer: MEDICAID | Admitting: Cardiology

## 2023-07-13 ENCOUNTER — Emergency Department
Admission: EM | Admit: 2023-07-13 | Discharge: 2023-07-13 | Disposition: A | Discharge status: Home / Self Care | Payer: MEDICAID | Attending: Emergency Medicine | Admitting: Emergency Medicine

## 2023-07-13 DIAGNOSIS — E039 Hypothyroidism, unspecified: Secondary | ICD-10-CM | POA: Insufficient documentation

## 2023-07-13 DIAGNOSIS — J449 Chronic obstructive pulmonary disease, unspecified: Secondary | ICD-10-CM | POA: Diagnosis not present

## 2023-07-13 DIAGNOSIS — Z7951 Long term (current) use of inhaled steroids: Secondary | ICD-10-CM | POA: Diagnosis not present

## 2023-07-13 DIAGNOSIS — Z79899 Other long term (current) drug therapy: Secondary | ICD-10-CM | POA: Diagnosis not present

## 2023-07-13 DIAGNOSIS — R110 Nausea: Secondary | ICD-10-CM | POA: Diagnosis not present

## 2023-07-13 DIAGNOSIS — E119 Type 2 diabetes mellitus without complications: Secondary | ICD-10-CM | POA: Insufficient documentation

## 2023-07-13 DIAGNOSIS — Z91040 Latex allergy status: Secondary | ICD-10-CM | POA: Insufficient documentation

## 2023-07-13 DIAGNOSIS — R1030 Lower abdominal pain, unspecified: Secondary | ICD-10-CM

## 2023-07-13 DIAGNOSIS — R1031 Right lower quadrant pain: Secondary | ICD-10-CM | POA: Diagnosis present

## 2023-07-13 DIAGNOSIS — R102 Pelvic and perineal pain: Secondary | ICD-10-CM | POA: Diagnosis not present

## 2023-07-13 DIAGNOSIS — Z7901 Long term (current) use of anticoagulants: Secondary | ICD-10-CM | POA: Diagnosis not present

## 2023-07-13 DIAGNOSIS — J45909 Unspecified asthma, uncomplicated: Secondary | ICD-10-CM | POA: Diagnosis not present

## 2023-07-13 DIAGNOSIS — Z85850 Personal history of malignant neoplasm of thyroid: Secondary | ICD-10-CM | POA: Diagnosis not present

## 2023-07-13 LAB — COMPREHENSIVE METABOLIC PANEL
ALT: 209 U/L — ABNORMAL HIGH (ref 0–44)
AST: 99 U/L — ABNORMAL HIGH (ref 15–41)
Albumin: 3.8 g/dL (ref 3.5–5.0)
Alkaline Phosphatase: 92 U/L (ref 38–126)
Anion gap: 9 (ref 5–15)
BUN: 15 mg/dL (ref 6–20)
CO2: 24 mmol/L (ref 22–32)
Calcium: 8.6 mg/dL — ABNORMAL LOW (ref 8.9–10.3)
Chloride: 105 mmol/L (ref 98–111)
Creatinine, Ser: 1.17 mg/dL — ABNORMAL HIGH (ref 0.44–1.00)
GFR, Estimated: 60 mL/min — ABNORMAL LOW (ref >60)
Glucose, Bld: 108 mg/dL — ABNORMAL HIGH (ref 70–99)
Potassium: 4.2 mmol/L (ref 3.5–5.1)
Sodium: 138 mmol/L (ref 135–145)
Total Bilirubin: 0.3 mg/dL (ref 0.3–1.2)
Total Protein: 7.1 g/dL (ref 6.5–8.1)

## 2023-07-13 LAB — CBC WITH DIFFERENTIAL/PLATELET
Abs Immature Granulocytes: 0.02 10*3/uL (ref 0.00–0.07)
Basophils Absolute: 0 10*3/uL (ref 0.0–0.1)
Basophils Relative: 0 %
Eosinophils Absolute: 0.1 10*3/uL (ref 0.0–0.5)
Eosinophils Relative: 2 %
HCT: 39.3 % (ref 36.0–46.0)
Hemoglobin: 12.6 g/dL (ref 12.0–15.0)
Immature Granulocytes: 0 %
Lymphocytes Relative: 30 %
Lymphs Abs: 2 10*3/uL (ref 0.7–4.0)
MCH: 27.8 pg (ref 26.0–34.0)
MCHC: 32.1 g/dL (ref 30.0–36.0)
MCV: 86.6 fL (ref 80.0–100.0)
Monocytes Absolute: 0.7 10*3/uL (ref 0.1–1.0)
Monocytes Relative: 10 %
Neutro Abs: 3.8 10*3/uL (ref 1.7–7.7)
Neutrophils Relative %: 58 %
Platelets: 252 10*3/uL (ref 150–400)
RBC: 4.54 MIL/uL (ref 3.87–5.11)
RDW: 13.7 % (ref 11.5–15.5)
WBC: 6.7 10*3/uL (ref 4.0–10.5)
nRBC: 0 % (ref 0.0–0.2)

## 2023-07-13 LAB — HCG, QUANTITATIVE, PREGNANCY: hCG, Beta Chain, Quant, S: <1 m[IU]/mL (ref <5)

## 2023-07-13 MED ORDER — SODIUM CHLORIDE 0.9 % IV BOLUS
1000.0000 mL | Freq: Once | INTRAVENOUS | Status: AC
  Administered 2023-07-13: 1000 mL via INTRAVENOUS

## 2023-07-13 MED ORDER — ONDANSETRON HCL 4 MG/2ML IJ SOLN
4.0000 mg | Freq: Once | INTRAMUSCULAR | Status: AC
  Administered 2023-07-13: 4 mg via INTRAVENOUS
  Filled 2023-07-13: qty 2

## 2023-07-13 MED ORDER — MORPHINE SULFATE (PF) 2 MG/ML IV SOLN
2.0000 mg | Freq: Once | INTRAVENOUS | Status: AC
  Administered 2023-07-13: 2 mg via INTRAVENOUS
  Filled 2023-07-13: qty 1

## 2023-07-13 NOTE — Discharge Instructions (Signed)
Return to the ER for recurrent or worsening symptoms, persistent vomiting, difficulty breathing, or if you change your mind and would like to complete your evaluation with urine specimen and CT scan.
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
## 2023-07-15 ENCOUNTER — Other Ambulatory Visit: Payer: Self-pay | Admitting: Family

## 2023-07-16 ENCOUNTER — Other Ambulatory Visit: Payer: Self-pay

## 2023-07-17 ENCOUNTER — Telehealth: Payer: Self-pay | Admitting: Nurse Practitioner

## 2023-07-17 NOTE — Telephone Encounter (Signed)
Patient left VM that she needs another hour of personal aide and needs a letter stating that she needs this. Says that she isn't able to complete what she needs in the time frame she currently has. Can we write letter stating she needs an extra hour and mail to her please? This is for [facility].

## 2023-07-20 ENCOUNTER — Emergency Department
Admission: EM | Admit: 2023-07-20 | Discharge: 2023-07-20 | Disposition: A | Discharge status: Home / Self Care | Payer: MEDICAID | Attending: Emergency Medicine | Admitting: Emergency Medicine

## 2023-07-20 ENCOUNTER — Emergency Department: Payer: MEDICAID

## 2023-07-20 ENCOUNTER — Other Ambulatory Visit: Payer: Self-pay

## 2023-07-20 ENCOUNTER — Telehealth: Payer: Self-pay

## 2023-07-20 ENCOUNTER — Encounter: Payer: Self-pay | Admitting: Oncology

## 2023-07-20 DIAGNOSIS — J449 Chronic obstructive pulmonary disease, unspecified: Secondary | ICD-10-CM | POA: Insufficient documentation

## 2023-07-20 DIAGNOSIS — I10 Essential (primary) hypertension: Secondary | ICD-10-CM | POA: Insufficient documentation

## 2023-07-20 DIAGNOSIS — Z85850 Personal history of malignant neoplasm of thyroid: Secondary | ICD-10-CM | POA: Insufficient documentation

## 2023-07-20 DIAGNOSIS — R404 Transient alteration of awareness: Secondary | ICD-10-CM | POA: Insufficient documentation

## 2023-07-20 DIAGNOSIS — R569 Unspecified convulsions: Secondary | ICD-10-CM

## 2023-07-20 LAB — URINALYSIS, ROUTINE W REFLEX MICROSCOPIC
Bacteria, UA: NONE SEEN
Bilirubin Urine: NEGATIVE
Glucose, UA: >=500 mg/dL — AB
Hgb urine dipstick: NEGATIVE
Ketones, ur: NEGATIVE mg/dL
Leukocytes,Ua: NEGATIVE
Nitrite: NEGATIVE
Protein, ur: NEGATIVE mg/dL
Specific Gravity, Urine: 1.022 (ref 1.005–1.030)
pH: 6 (ref 5.0–8.0)

## 2023-07-20 LAB — CBC WITH DIFFERENTIAL/PLATELET
Abs Immature Granulocytes: 0.06 10*3/uL (ref 0.00–0.07)
Basophils Absolute: 0 10*3/uL (ref 0.0–0.1)
Basophils Relative: 0 %
Eosinophils Absolute: 0.1 10*3/uL (ref 0.0–0.5)
Eosinophils Relative: 1 %
HCT: 40.4 % (ref 36.0–46.0)
Hemoglobin: 12.5 g/dL (ref 12.0–15.0)
Immature Granulocytes: 1 %
Lymphocytes Relative: 25 %
Lymphs Abs: 2 10*3/uL (ref 0.7–4.0)
MCH: 27.1 pg (ref 26.0–34.0)
MCHC: 30.9 g/dL (ref 30.0–36.0)
MCV: 87.4 fL (ref 80.0–100.0)
Monocytes Absolute: 0.8 10*3/uL (ref 0.1–1.0)
Monocytes Relative: 10 %
Neutro Abs: 5.2 10*3/uL (ref 1.7–7.7)
Neutrophils Relative %: 63 %
Platelets: 259 10*3/uL (ref 150–400)
RBC: 4.62 MIL/uL (ref 3.87–5.11)
RDW: 13.9 % (ref 11.5–15.5)
WBC: 8.2 10*3/uL (ref 4.0–10.5)
nRBC: 0 % (ref 0.0–0.2)

## 2023-07-20 LAB — COMPREHENSIVE METABOLIC PANEL
ALT: 84 U/L — ABNORMAL HIGH (ref 0–44)
AST: 42 U/L — ABNORMAL HIGH (ref 15–41)
Albumin: 3.6 g/dL (ref 3.5–5.0)
Alkaline Phosphatase: 76 U/L (ref 38–126)
Anion gap: 10 (ref 5–15)
BUN: 16 mg/dL (ref 6–20)
CO2: 21 mmol/L — ABNORMAL LOW (ref 22–32)
Calcium: 8.4 mg/dL — ABNORMAL LOW (ref 8.9–10.3)
Chloride: 104 mmol/L (ref 98–111)
Creatinine, Ser: 1.22 mg/dL — ABNORMAL HIGH (ref 0.44–1.00)
GFR, Estimated: 57 mL/min — ABNORMAL LOW (ref >60)
Glucose, Bld: 87 mg/dL (ref 70–99)
Potassium: 4.2 mmol/L (ref 3.5–5.1)
Sodium: 135 mmol/L (ref 135–145)
Total Bilirubin: 0.5 mg/dL (ref 0.3–1.2)
Total Protein: 6.9 g/dL (ref 6.5–8.1)

## 2023-07-20 LAB — URINE DRUG SCREEN, QUALITATIVE (ARMC ONLY)
Amphetamines, Ur Screen: NOT DETECTED
Barbiturates, Ur Screen: NOT DETECTED
Benzodiazepine, Ur Scrn: NOT DETECTED
Cannabinoid 50 Ng, Ur ~~LOC~~: NOT DETECTED
Cocaine Metabolite,Ur ~~LOC~~: NOT DETECTED
MDMA (Ecstasy)Ur Screen: NOT DETECTED
Methadone Scn, Ur: NOT DETECTED
Opiate, Ur Screen: NOT DETECTED
Phencyclidine (PCP) Ur S: NOT DETECTED
Tricyclic, Ur Screen: POSITIVE — AB

## 2023-07-20 LAB — LACTIC ACID, PLASMA: Lactic Acid, Venous: 2.5 mmol/L (ref 0.5–1.9)

## 2023-07-20 LAB — POC URINE PREG, ED: Preg Test, Ur: NEGATIVE

## 2023-07-20 LAB — CBG MONITORING, ED: Glucose-Capillary: 93 mg/dL (ref 70–99)

## 2023-07-20 MED ORDER — CYCLOBENZAPRINE HCL 10 MG PO TABS
5.0000 mg | ORAL_TABLET | Freq: Once | ORAL | Status: AC
  Administered 2023-07-20: 5 mg via ORAL
  Filled 2023-07-20: qty 1

## 2023-07-20 MED ORDER — IBUPROFEN 600 MG PO TABS
600.0000 mg | ORAL_TABLET | Freq: Once | ORAL | Status: AC | PRN
  Administered 2023-07-20: 600 mg via ORAL
  Filled 2023-07-20: qty 1

## 2023-07-20 NOTE — Telephone Encounter (Signed)
Patient Kristina Li with answering service about getting her abilify refilled, it looks like the [facility] Marlou Porch sent her in #30 & 3R so patient will not need refill untl September 09, 2023

## 2023-07-20 NOTE — ED Notes (Signed)
Taking over care of pt at this time

## 2023-07-20 NOTE — ED Triage Notes (Addendum)
Pt brought in via ems due to seizure (witnessed by neighbors). Pt bit tongue during episode. Pt denies urinating on self. Pt does not recall event. Pt is post icle upon ems arrival. No meds given PTA. Pt denies hx of seizures.

## 2023-07-20 NOTE — ED Provider Notes (Signed)
[facility] Provider Note   Event Date/Time   First MD Initiated Contact with Patient 07/20/23 1844     (approximate) History  Seizures  HPI Kristina Li is a 42 y.o. female with stated past medical history of PTSD, thyroid cancer, COPD, hypertension who presents complaining of of seizure-like activity that was witnessed by neighbors.  Patient states that she bit her tongue as well as the left aspect of the upper lip during this episode.  Patient denies any bowel/bladder incontinence.  Patient does not remember these events and has difficulty remembering any events from today.  EMS did note a postictal stage upon their arrival in which patient was only oriented to name.  Patient was not given any meds prior to arrival and states that she has not had any history of seizures.  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 ROS: Patient currently denies any vision changes, tinnitus, difficulty speaking, facial droop, sore throat, chest pain, shortness of breath, abdominal pain, nausea/vomiting/diarrhea, dysuria, or weakness/numbness/paresthesias in any extremity   Physical Exam  Triage Vital Signs: ED Triage Vitals  Encounter Vitals Group     BP 07/20/23 1842 122/69     Systolic BP Percentile --      Diastolic BP Percentile --      Pulse Rate 07/20/23 1842 94     Resp 07/20/23 1842 18     Temp 07/20/23 1842 98.6 F (37 C)     Temp Source 07/20/23 1842 Oral     SpO2 07/20/23 1842 100 %     Weight 07/20/23 1842 200 lb (90.7 kg)     Height 07/20/23 1842 5\' 6"  (1.676 m)     Head Circumference --      Peak Flow --      Pain Score 07/20/23 1841 4     Pain Loc --      Pain Education --      Exclude from Growth Chart --    Most recent vital signs: Vitals:   07/20/23 2030 07/20/23 2100  BP: 107/64 107/66  Pulse: 82 82  Resp: 19   Temp:    SpO2: 99% 99%   General: Awake, oriented x4. CV:  Good  peripheral perfusion.  Resp:  Normal effort.  Abd:  No distention.  Other:  Middle-aged overweight African-American female laying in bed in no acute distress.  NIHSS 0 ED Results / Procedures / Treatments  Labs (all labs ordered are listed, but only abnormal results are displayed) Labs Reviewed  COMPREHENSIVE METABOLIC PANEL - Abnormal; Notable for the following components:      Result Value   CO2 21 (*)    Creatinine, Ser 1.22 (*)    Calcium 8.4 (*)    AST 42 (*)    ALT 84 (*)    GFR, Estimated 57 (*)    All other components within normal limits  LACTIC ACID, PLASMA - Abnormal; Notable for the following components:   Lactic Acid, Venous 2.5 (*)    All other components within normal limits  URINALYSIS, ROUTINE W REFLEX MICROSCOPIC - Abnormal; Notable for the following components:   Color, Urine YELLOW (*)    APPearance CLEAR (*)    Glucose, UA >=500 (*)    All other components within normal limits  URINE DRUG SCREEN, QUALITATIVE ([facility] ONLY) - Abnormal; Notable for the following components:   Tricyclic, Ur Screen POSITIVE (*)    All other components  within normal limits  CBC WITH DIFFERENTIAL/PLATELET  CBG MONITORING, ED  POC URINE PREG, ED  RADIOLOGY ED MD interpretation: CT of the head without contrast interpreted by me shows no evidence of acute abnormalities including no intracerebral hemorrhage, obvious masses, or significant edema  X-ray of the right shoulder interpreted independently by me and shows no evidence of acute abnormalities -Agree with radiology assessment Official radiology report(s): DG Shoulder Right  Result Date: 07/20/2023 CLINICAL DATA:  Seizure shoulder pain EXAM: RIGHT SHOULDER - 2+ VIEW COMPARISON:  None Available. FINDINGS: There is no evidence of fracture or dislocation. There is no evidence of arthropathy or other focal bone abnormality. Soft tissues are unremarkable. IMPRESSION: Negative. Electronically Signed   By: Jasmine Pang M.D.   On:  07/20/2023 20:25   CT Head Wo Contrast  Result Date: 07/20/2023 CLINICAL DATA:  Seizure EXAM: CT HEAD WITHOUT CONTRAST TECHNIQUE: Contiguous axial images were obtained from the base of the skull through the vertex without intravenous contrast. RADIATION DOSE REDUCTION: This exam was performed according to the departmental dose-optimization program which includes automated exposure control, adjustment of the mA and/or kV according to patient size and/or use of iterative reconstruction technique. COMPARISON:  MRI 05/14/2017 FINDINGS: Brain: No evidence of acute infarction, hemorrhage, hydrocephalus, extra-axial collection or mass lesion/mass effect. Vascular: No hyperdense vessel or unexpected calcification. Skull: Normal. Negative for fracture or focal lesion. Sinuses/Orbits: No acute finding. Other: None IMPRESSION: Negative non contrasted CT appearance of the brain. Electronically Signed   By: Jasmine Pang M.D.   On: 07/20/2023 19:16   PROCEDURES: Critical Care performed: No .1-3 Lead EKG Interpretation  Performed by: Merwyn Katos, MD Authorized by: Merwyn Katos, MD     Interpretation: normal     ECG rate:  71   ECG rate assessment: normal     Rhythm: sinus rhythm     Ectopy: none     Conduction: normal    MEDICATIONS ORDERED IN ED: Medications  ibuprofen (ADVIL) tablet 600 mg (600 mg Oral Given 07/20/23 2044)  cyclobenzaprine (FLEXERIL) tablet 5 mg (5 mg Oral Given 07/20/23 2135)   IMPRESSION / MDM / ASSESSMENT AND PLAN / ED COURSE  I reviewed the triage vital signs and the nursing notes.                             The patient is on the cardiac monitor to evaluate for evidence of arrhythmia and/or significant heart rate changes.* Patient's presentation is most consistent with acute presentation with potential threat to life or bodily function. Patient presents after recent seizure-like episode.  Patient had slow return to baseline mental and physical function per EMS. No  immunosuppresion hx and had no preceding fever. No history of alcohol abuse or suspicion for toxin ingestion. Unlikely stroke, syncope. Unlikely infectious etiology. No preceding trauma.  Workup: EKG, BMP, POCT glucose (pregnancy test if female) and CT Brain.  Field Interventions: None ED Interventions: None has patient has not exhibited any further seizure-like activity during her emergency department course.  Negative head CT here today.  Patient is stable for discharge at this time and follow-up with neurology with seizure precautions discussed and patient expressed understanding.  Patient also given strict return precautions and she expressed understanding. Disposition: Discharge home with primary care follow up in next 24-48 hours.   FINAL CLINICAL IMPRESSION(S) / ED DIAGNOSES   Final diagnoses:  Seizure-like activity (HCC)  Transient alteration of awareness  Rx / DC Orders   ED Discharge Orders     None      Note:  This document was prepared using Dragon voice recognition software and may include unintentional dictation errors.   Merwyn Katos, MD 07/21/23 [phone]

## 2023-07-21 ENCOUNTER — Other Ambulatory Visit: Payer: Self-pay | Admitting: Cardiology

## 2023-07-21 ENCOUNTER — Ambulatory Visit: Payer: MEDICAID | Admitting: Cardiology

## 2023-07-21 MED ORDER — LIDOCAINE VISCOUS HCL 2 % MT SOLN: 5.0000 mL | Freq: Three times a day (TID) | OROMUCOSAL | 0 refills | Status: DC | PRN

## 2023-07-30 ENCOUNTER — Other Ambulatory Visit: Payer: Self-pay | Admitting: Neurology

## 2023-07-30 DIAGNOSIS — F4489 Other dissociative and conversion disorders: Secondary | ICD-10-CM

## 2023-07-30 DIAGNOSIS — R569 Unspecified convulsions: Secondary | ICD-10-CM

## 2023-07-31 ENCOUNTER — Ambulatory Visit
Admission: RE | Admit: 2023-07-31 | Discharge: 2023-07-31 | Disposition: A | Discharge status: Home / Self Care | Payer: MEDICAID | Source: Ambulatory Visit | Attending: Cardiology

## 2023-07-31 ENCOUNTER — Ambulatory Visit
Admission: RE | Admit: 2023-07-31 | Discharge: 2023-07-31 | Disposition: A | Discharge status: Home / Self Care | Payer: MEDICAID | Attending: Cardiology | Admitting: Cardiology

## 2023-07-31 ENCOUNTER — Encounter: Payer: Self-pay | Admitting: Cardiology

## 2023-07-31 ENCOUNTER — Ambulatory Visit (INDEPENDENT_AMBULATORY_CARE_PROVIDER_SITE_OTHER): Payer: MEDICAID | Admitting: Cardiology

## 2023-07-31 VITALS — BP 110/70 | HR 86 | Ht 66.0 in | Wt 227.8 lb

## 2023-07-31 DIAGNOSIS — I10 Essential (primary) hypertension: Secondary | ICD-10-CM | POA: Diagnosis not present

## 2023-07-31 DIAGNOSIS — R0789 Other chest pain: Secondary | ICD-10-CM

## 2023-07-31 DIAGNOSIS — D2230 Melanocytic nevi of unspecified part of face: Secondary | ICD-10-CM | POA: Diagnosis not present

## 2023-07-31 NOTE — Progress Notes (Signed)
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  Lactic acid, plasma     Status: Abnormal   Collection Time: 07/20/23  6:50 PM  Result Value Ref Range   Lactic Acid, Venous 2.5 (HH) 0.5 - 1.9 mmol/L    Comment: CRITICAL RESULT CALLED TO, READ BACK BY AND VERIFIED WITH ROSE COBURN @1934  ON 07/20/23 SKL Performed at [facility], [address] [place], [place] [address]   CBC with Differential     Status: None   Collection Time: 07/20/23  6:50 PM  Result Value Ref Range    WBC 8.2 4.0 - 10.5 K/uL   RBC 4.62 3.87 - 5.11 MIL/uL   Hemoglobin 12.5 12.0 - 15.0 g/dL   HCT 57.8 46.9 - 62.9 %   MCV 87.4 80.0 - 100.0 fL   MCH 27.1 26.0 - 34.0 pg   MCHC 30.9 30.0 - 36.0 g/dL   RDW 52.8 41.3 - 24.4 %   Platelets 259 150 - 400 K/uL   nRBC 0.0 0.0 - 0.2 %   Neutrophils Relative % 63 %   Neutro Abs 5.2 1.7 - 7.7 K/uL   Lymphocytes Relative 25 %   Lymphs Abs 2.0 0.7 - 4.0 K/uL   Monocytes Relative 10 %   Monocytes Absolute 0.8 0.1 - 1.0 K/uL   Eosinophils Relative 1 %   Eosinophils Absolute 0.1 0.0 - 0.5 K/uL   Basophils Relative 0 %   Basophils Absolute 0.0 0.0 - 0.1 K/uL   Immature Granulocytes 1 %   Abs Immature Granulocytes 0.06 0.00 - 0.07 K/uL    Comment: Performed at [facility], [address] [place],  [place] [address]  Urinalysis, Routine w reflex microscopic -Urine, Clean Catch     Status: Abnormal   Collection Time: 07/20/23  8:12 PM  Result Value Ref Range   Color, Urine YELLOW (A) YELLOW   APPearance CLEAR (A) CLEAR   Specific Gravity, Urine 1.022 1.005 - 1.030   pH 6.0 5.0 - 8.0   Glucose, UA >=500 (A) NEGATIVE mg/dL   Hgb urine dipstick NEGATIVE NEGATIVE   Bilirubin Urine NEGATIVE NEGATIVE   Ketones, ur NEGATIVE NEGATIVE mg/dL   Protein, ur NEGATIVE NEGATIVE mg/dL   Nitrite NEGATIVE NEGATIVE   Leukocytes,Ua NEGATIVE NEGATIVE   RBC / HPF 0-5 0 - 5 RBC/hpf   WBC, UA 0-5 0 - 5 WBC/hpf   Bacteria, UA NONE SEEN NONE SEEN   Squamous Epithelial / HPF 0-5 0 - 5 /HPF    Comment: Performed at [facility], [address], [place], [place] [address]  Urine Drug Screen, Qualitative     Status: Abnormal   Collection Time: 07/20/23  8:12 PM  Result Value Ref Range   Tricyclic, Ur Screen POSITIVE (A) NONE DETECTED   Amphetamines, Ur Screen NONE DETECTED NONE DETECTED   MDMA (Ecstasy)Ur Screen NONE DETECTED NONE DETECTED   Cocaine Metabolite,Ur [place] NONE DETECTED NONE DETECTED   Opiate, Ur Screen NONE DETECTED NONE DETECTED    Phencyclidine (PCP) Ur S NONE DETECTED NONE DETECTED   Cannabinoid 50 Ng, Ur [place] NONE DETECTED NONE DETECTED   Barbiturates, Ur Screen NONE DETECTED NONE DETECTED   Benzodiazepine, Ur Scrn NONE DETECTED NONE DETECTED   Methadone Scn, Ur NONE DETECTED NONE DETECTED    Comment: (NOTE) Tricyclics + metabolites, urine    Cutoff 1000 ng/mL Amphetamines + metabolites, urine  Cutoff 1000 ng/mL MDMA (Ecstasy), urine              Cutoff 500 ng/mL Cocaine Metabolite, urine          Cutoff 300 ng/mL Opiate + metabolites, urine        Cutoff 300 ng/mL Phencyclidine (PCP), urine         Cutoff 25 ng/mL Cannabinoid, urine                 Cutoff 50 ng/mL Barbiturates + metabolites, urine  Cutoff 200 ng/mL Benzodiazepine, urine              Cutoff 200 ng/mL Methadone, urine                   Cutoff 300 ng/mL  The urine drug screen provides only a preliminary, unconfirmed analytical test result and should not be used for non-medical purposes. Clinical consideration and professional judgment should be applied to any positive drug screen result due to possible interfering substances. A more specific alternate chemical method must be used in order to obtain a confirmed analytical result. Gas chromatography / mass spectrometry (GC/MS) is the preferred confirm atory method. Performed at [facility], [address] [place], [place] [address]   CBG monitoring, ED     Status: None   Collection Time: 07/20/23  8:13 PM  Result Value Ref Range   Glucose-Capillary 93 70 - 99 mg/dL    Comment: Glucose reference range applies only to samples taken after fasting for at least 8 hours.  POC urine preg, ED     Status: None   Collection Time: 07/20/23  8:36 PM  Result Value Ref Range   Preg Test, Ur NEGATIVE NEGATIVE    Comment:  THE SENSITIVITY OF THIS METHODOLOGY IS >24 mIU/mL       Assessment & Plan:  Keep appointments with neurology. Keep appointments with orthopaedics.  Schedule  mammogram at next visit. Schedule pap smear at next visit.   Problem List Items Addressed This Visit       Cardiovascular and Mediastinum   HTN (hypertension) - Primary   Other Visit Diagnoses     Change in facial mole       Chest wall discomfort           Return in about 2 months (around 09/30/2023).   Total time spent: 25 minutes  [employer], NP  07/31/2023   This document may have been prepared by Dragon Voice Recognition software and as such may include unintentional dictation errors.

## 2023-08-04 NOTE — Progress Notes (Signed)
Spoke with pt who verbalized understanding.

## 2023-08-07 ENCOUNTER — Other Ambulatory Visit: Payer: Self-pay | Admitting: Orthopedic Surgery

## 2023-08-07 DIAGNOSIS — M25511 Pain in right shoulder: Secondary | ICD-10-CM

## 2023-08-13 ENCOUNTER — Ambulatory Visit
Admission: RE | Admit: 2023-08-13 | Discharge: 2023-08-13 | Disposition: A | Discharge status: Home / Self Care | Payer: MEDICAID | Source: Ambulatory Visit | Attending: Orthopedic Surgery | Admitting: Orthopedic Surgery

## 2023-08-13 ENCOUNTER — Telehealth: Payer: Self-pay | Admitting: Internal Medicine

## 2023-08-13 ENCOUNTER — Other Ambulatory Visit: Payer: Self-pay | Admitting: Cardiology

## 2023-08-13 DIAGNOSIS — M249 Joint derangement, unspecified: Secondary | ICD-10-CM | POA: Insufficient documentation

## 2023-08-13 DIAGNOSIS — M25511 Pain in right shoulder: Secondary | ICD-10-CM

## 2023-08-13 DIAGNOSIS — G8929 Other chronic pain: Secondary | ICD-10-CM | POA: Diagnosis not present

## 2023-08-13 MED ORDER — GADOBUTROL 1 MMOL/ML IV SOLN
2.0000 mL | Freq: Once | INTRAVENOUS | Status: AC | PRN
  Administered 2023-08-13: 0.05 mL

## 2023-08-13 MED ORDER — IOHEXOL 180 MG/ML  SOLN
20.0000 mL | Freq: Once | INTRAMUSCULAR | Status: AC | PRN
  Administered 2023-08-13: 7 mL

## 2023-08-13 MED ORDER — LIDOCAINE HCL (PF) 1 % IJ SOLN
10.0000 mL | Freq: Once | INTRAMUSCULAR | Status: AC
  Administered 2023-08-13: 6 mL
  Filled 2023-08-13: qty 10

## 2023-08-13 MED ORDER — SODIUM CHLORIDE (PF) 0.9% IJ SOLUTION - NO CHARGE
20.0000 mL | Freq: Once | INTRAMUSCULAR | Status: AC
  Administered 2023-08-13: 5 mL
  Filled 2023-08-13: qty 20

## 2023-08-13 NOTE — Telephone Encounter (Signed)
Patient called needing an updated problem list listing all of her shoulder problems before she has shoulder surgery next week. She needs this for her PCS services is requesting it so that she can get an additional hour of PCS a day. Patient is requesting that Amber give her a call.

## 2023-08-19 ENCOUNTER — Telehealth: Payer: Self-pay

## 2023-08-19 NOTE — Telephone Encounter (Signed)
Patient left VM requesting more hours for [facility]. I know that we filled out some paperwork for them the other day but it did not state anything about hours. We need to call patient and see if we need to type her up a letter or what exactly she is wanting.

## 2023-08-24 ENCOUNTER — Other Ambulatory Visit: Payer: MEDICAID

## 2023-09-08 ENCOUNTER — Encounter: Payer: Self-pay | Admitting: *Deleted

## 2023-09-08 ENCOUNTER — Other Ambulatory Visit: Payer: Self-pay

## 2023-09-08 ENCOUNTER — Emergency Department
Admission: EM | Admit: 2023-09-08 | Discharge: 2023-09-08 | Disposition: A | Discharge status: Home / Self Care | Payer: MEDICAID | Attending: Emergency Medicine | Admitting: Emergency Medicine

## 2023-09-08 ENCOUNTER — Emergency Department: Payer: MEDICAID

## 2023-09-08 DIAGNOSIS — Z23 Encounter for immunization: Secondary | ICD-10-CM | POA: Insufficient documentation

## 2023-09-08 DIAGNOSIS — R569 Unspecified convulsions: Secondary | ICD-10-CM | POA: Diagnosis present

## 2023-09-08 LAB — CBC
HCT: 39.2 % (ref 36.0–46.0)
Hemoglobin: 12.2 g/dL (ref 12.0–15.0)
MCH: 26 pg (ref 26.0–34.0)
MCHC: 31.1 g/dL (ref 30.0–36.0)
MCV: 83.4 fL (ref 80.0–100.0)
Platelets: 228 10*3/uL (ref 150–400)
RBC: 4.7 MIL/uL (ref 3.87–5.11)
RDW: 15.4 % (ref 11.5–15.5)
WBC: 8.1 10*3/uL (ref 4.0–10.5)
nRBC: 0 % (ref 0.0–0.2)

## 2023-09-08 LAB — CBG MONITORING, ED: Glucose-Capillary: 94 mg/dL (ref 70–99)

## 2023-09-08 LAB — BASIC METABOLIC PANEL
Anion gap: 11 (ref 5–15)
BUN: 14 mg/dL (ref 6–20)
CO2: 24 mmol/L (ref 22–32)
Calcium: 8.9 mg/dL (ref 8.9–10.3)
Chloride: 101 mmol/L (ref 98–111)
Creatinine, Ser: 1.13 mg/dL — ABNORMAL HIGH (ref 0.44–1.00)
GFR, Estimated: >60 mL/min (ref >60)
Glucose, Bld: 98 mg/dL (ref 70–99)
Potassium: 4.6 mmol/L (ref 3.5–5.1)
Sodium: 136 mmol/L (ref 135–145)

## 2023-09-08 LAB — POC URINE PREG, ED: Preg Test, Ur: NEGATIVE

## 2023-09-08 MED ORDER — MIDAZOLAM HCL 2 MG/2ML IJ SOLN
2.0000 mg | Freq: Once | INTRAMUSCULAR | Status: DC
  Filled 2023-09-08: qty 2

## 2023-09-08 MED ORDER — LORAZEPAM 2 MG/ML IJ SOLN
2.0000 mg | Freq: Once | INTRAMUSCULAR | Status: AC
  Administered 2023-09-08: 2 mg via INTRAVENOUS
  Filled 2023-09-08: qty 1

## 2023-09-08 MED ORDER — GADOBUTROL 1 MMOL/ML IV SOLN
10.0000 mL | Freq: Once | INTRAVENOUS | Status: AC | PRN
  Administered 2023-09-08: 10 mL via INTRAVENOUS

## 2023-09-08 MED ORDER — ACETAMINOPHEN 500 MG PO TABS
1000.0000 mg | ORAL_TABLET | Freq: Once | ORAL | Status: AC
  Administered 2023-09-08: 1000 mg via ORAL
  Filled 2023-09-08: qty 2

## 2023-09-08 MED ORDER — TETANUS-DIPHTH-ACELL PERTUSSIS 5-2.5-18.5 LF-MCG/0.5 IM SUSY
0.5000 mL | PREFILLED_SYRINGE | Freq: Once | INTRAMUSCULAR | Status: AC
  Administered 2023-09-08: 0.5 mL via INTRAMUSCULAR
  Filled 2023-09-08: qty 0.5

## 2023-09-08 MED ORDER — LEVETIRACETAM IN NACL 1000 MG/100ML IV SOLN
1000.0000 mg | Freq: Once | INTRAVENOUS | Status: AC
  Administered 2023-09-08: 1000 mg via INTRAVENOUS
  Filled 2023-09-08: qty 100

## 2023-09-08 NOTE — Discharge Instructions (Addendum)
You were seen in the emergency department following an episode of a seizure.  He had a CT scan done of your head that did not show any internal bleeding.  You are given a dose of IV keppra in the emergency department.  You had an MRI with and without contrast that was done, you need to have it followed up with your neurologist Dr. Malvin Johns.  Call Dr. Malvin Johns tomorrow to schedule close follow-up appointment.  Continue to take your lamotrigine as scheduled.  Do not drive or do any activity that would cause you harm if you had a seizure while doing it for the next 6 months.  Thank you for choosing [place] for your health care, it was my pleasure to care for you today!  Corena Herter, MD

## 2023-09-08 NOTE — ED Triage Notes (Addendum)
Pt brought in via ems with a seizure today.  Pt is in a recliner in triage.  Pt confused, postictal.  Pt reports pain in back of head when she fell getting out of car.  Pt has abrasion to left elbow area.  Bleeding controlled.  Iv in place.  Pt bit tongue.  Pt alert.   Pt reports this is only the second seizure.

## 2023-09-08 NOTE — ED Provider Notes (Signed)
[facility] Provider Note    Event Date/Time   First MD Initiated Contact with Patient 09/08/23 [phone]     (approximate)   History   Seizures   HPI  EMERITA Li is a 42 y.o. female past medical history significant for recently diagnosed with seizure, who presents to the emergency department following a seizure.  Per report patient whenever trying to get out of the vehicle had generalized shaking episode and fell to the ground.  Did have tongue biting.  Confused after this episode.  Mother and father are here with her and states that she is back to her normal now.  Did hit the back of her head and is complaining of pain to her left shoulder.  1 month ago was diagnosed with new onset seizure.  Followed by Dr. Malvin Johns as an outpatient is currently on lamotrigine.  Had an EEG that was normal.  Currently waiting for MRI and 72-hour EEG.  Good amount of sleep last night.  No fever, chills, cough, dysuria.  No concern for pregnancy.  Uncertain of last tetanus shot.     Physical Exam   Triage Vital Signs: ED Triage Vitals  Encounter Vitals Group     BP 09/08/23 1535 101/62     Systolic BP Percentile --      Diastolic BP Percentile --      Pulse Rate 09/08/23 1535 91     Resp 09/08/23 1535 18     Temp 09/08/23 1535 98 F (36.7 C)     Temp Source 09/08/23 1535 Oral     SpO2 09/08/23 1535 95 %     Weight 09/08/23 1536 215 lb (97.5 kg)     Height 09/08/23 1536 5\' 6"  (1.676 m)     Head Circumference --      Peak Flow --      Pain Score 09/08/23 1535 7     Pain Loc --      Pain Education --      Exclude from Growth Chart --     Most recent vital signs: Vitals:   09/08/23 1535 09/08/23 1846  BP: 101/62 112/65  Pulse: 91 81  Resp: 18 16  Temp: 98 F (36.7 C) 98.4 F (36.9 C)  SpO2: 95% 100%    Physical Exam Constitutional:      Appearance: She is well-developed.  HENT:     Head: Atraumatic.  Eyes:     Conjunctiva/sclera: Conjunctivae  normal.  Cardiovascular:     Rate and Rhythm: Regular rhythm.  Pulmonary:     Effort: No respiratory distress.  Abdominal:     General: There is no distension.  Musculoskeletal:        General: Normal range of motion.     Cervical back: Normal range of motion. No tenderness.  Skin:    General: Skin is warm.     Comments: Abrasion to the left elbow.  Mild underlying tenderness to palpation.  No tenderness to palpation to bilateral shoulders, cervical, thoracic or lumbar spine.  Neurological:     Mental Status: She is alert. Mental status is at baseline.     GCS: GCS eye subscore is 4. GCS verbal subscore is 5. GCS motor subscore is 6.     Cranial Nerves: Cranial nerves 2-12 are intact.     Sensory: Sensation is intact.     Motor: Motor function is intact.     IMPRESSION / MDM / ASSESSMENT AND PLAN / ED COURSE  I reviewed the triage vital signs and the nursing notes.  Differential diagnosis including intracranial hemorrhage, seizure, electrolyte abnormality.    No tachycardic or bradycardic dysrhythmias while on cardiac telemetry.  RADIOLOGY I independently reviewed imaging, my interpretation of imaging: CT scan of the head without signs of intracranial hemorrhage or infarction.  MRI with and without obtained, not yet read by the radiologist, discussed with patient and her family members that this will not be read today and that they can follow-up with her neurologist to have a formal read and to follow-up with the read.  X-ray of the left elbow with no acute fracture or dislocation.  LABS (all labs ordered are listed, but only abnormal results are displayed) Labs interpreted as -    Labs Reviewed  BASIC METABOLIC PANEL - Abnormal; Notable for the following components:      Result Value   Creatinine, Ser 1.13 (*)    All other components within normal limits  POC URINE PREG, ED - Normal  CBC  LAMOTRIGINE LEVEL  CBG MONITORING, ED     MDM  No significant  electrolyte abnormalities.  No signs of an infectious process.  Clinical picture is not consistent with meningitis, no fever, no meningismus.  CT scan of the head without signs of intracranial hemorrhage or infarction.  Patient returned back to her baseline.  No concern for subclinical seizure or status epilepticus.  Consulted and discussed the patient's case with neurology Dr. Wilford Corner, recommended a dose of IV Keppra 1 g, continue on her lamotrigine level and recommended given lamotrigine level that can be followed up as an outpatient with Dr. Malvin Johns.  Discussed MRI with and without just to help for outpatient planning, did not feel that it was necessary during this visit.  MRI with and without ordered.  Results have not yet came through at time of discharge.  Given IV Keppra.  Nonfocal exam.  Discussed that she can follow-up with her neurologist to get the results.  Given return precautions for any ongoing or worsening symptoms.  Encouraged to take her lamotrigine as prescribed.     PROCEDURES:  Critical Care performed: No  Procedures  Patient's presentation is most consistent with acute presentation with potential threat to life or bodily function.   MEDICATIONS ORDERED IN ED: Medications  levETIRAcetam (KEPPRA) IVPB 1000 mg/100 mL premix (1,000 mg Intravenous New Bag/Given 09/08/23 2255)  acetaminophen (TYLENOL) tablet 1,000 mg (1,000 mg Oral Given 09/08/23 2019)  Tdap (BOOSTRIX) injection 0.5 mL (0.5 mLs Intramuscular Given 09/08/23 2021)  LORazepam (ATIVAN) injection 2 mg (2 mg Intravenous Given 09/08/23 2202)  gadobutrol (GADAVIST) 1 MMOL/ML injection 10 mL (10 mLs Intravenous Contrast Given 09/08/23 2213)    FINAL CLINICAL IMPRESSION(S) / ED DIAGNOSES   Final diagnoses:  Seizure (HCC)     Rx / DC Orders   ED Discharge Orders     None        Note:  This document was prepared using Dragon voice recognition software and may include unintentional dictation errors.   Corena Herter, MD 09/08/23 2306

## 2023-09-08 NOTE — ED Notes (Signed)
Patient transported to MRI 

## 2023-09-08 NOTE — ED Notes (Signed)
Provided pt with discharge instructions and education. All of pt questions answered. Pt in possession of all belongings. Pt AAOX4 and stable at time of discharge. Pt able to self transfer to wheelchair. Pt transported to pt mother vehicle safely.

## 2023-09-08 NOTE — ED Triage Notes (Signed)
Arrives via [facility] for c/o seizure.  While getting out of a vehicle, paitent shaking and fell to the ground.  Post ichtal per EMS.  20g Lhand. Has had one prior seizure, approx one month ago.  120 P 120/65 CBG:137

## 2023-09-11 LAB — LAMOTRIGINE LEVEL: Lamotrigine Lvl: 4.1 ug/mL (ref 2.0–20.0)

## 2023-09-14 ENCOUNTER — Other Ambulatory Visit: Payer: Self-pay | Admitting: Internal Medicine

## 2023-09-16 ENCOUNTER — Other Ambulatory Visit: Payer: Self-pay | Admitting: Internal Medicine

## 2023-09-16 MED ORDER — SLYND 4 MG PO TABS: 1.0000 | ORAL_TABLET | Freq: Every day | ORAL | 3 refills | Status: DC

## 2023-09-22 ENCOUNTER — Telehealth: Payer: Self-pay

## 2023-09-24 NOTE — Telephone Encounter (Signed)
Patient Kristina Li asking if she can stop her xarelto, she's is supposed to have surg at [facility] point [facility], we need to call patient and ask her to call the surgery center and have them fax [place] paperwork if she needs to have this stopped

## 2023-09-25 ENCOUNTER — Telehealth: Payer: Self-pay | Admitting: Internal Medicine

## 2023-09-25 NOTE — Telephone Encounter (Signed)
Left VM stating they were hoping to get more records than just a surgery clearance for this patient before her upcoming surgery on Monday, 10/21.

## 2023-09-29 NOTE — Telephone Encounter (Signed)
[facility] was called and confirmed that patient has had surgery no other records needed.

## 2023-10-01 ENCOUNTER — Ambulatory Visit: Payer: MEDICAID | Admitting: Cardiology

## 2023-10-09 ENCOUNTER — Other Ambulatory Visit: Payer: Self-pay | Admitting: Cardiovascular Disease

## 2023-10-12 ENCOUNTER — Other Ambulatory Visit: Payer: Self-pay

## 2023-10-12 MED ORDER — LEVOTHYROXINE SODIUM 75 MCG PO TABS: 75.0000 ug | ORAL_TABLET | Freq: Every day | ORAL | 3 refills | Status: DC

## 2023-10-12 MED ORDER — RIVAROXABAN 20 MG PO TABS: 20.0000 mg | ORAL_TABLET | Freq: Every day | ORAL | 3 refills | Status: DC

## 2023-10-16 ENCOUNTER — Encounter: Payer: Self-pay | Admitting: Oncology

## 2023-10-21 ENCOUNTER — Telehealth: Payer: Self-pay

## 2023-10-21 NOTE — Telephone Encounter (Signed)
Needs refill for Liothyrone Sodium 25 mg called into [facility] [phone].

## 2023-10-22 ENCOUNTER — Other Ambulatory Visit: Payer: Self-pay | Admitting: Internal Medicine

## 2023-10-22 ENCOUNTER — Other Ambulatory Visit: Payer: Self-pay | Admitting: Cardiology

## 2023-10-22 MED ORDER — LIOTHYRONINE SODIUM 25 MCG PO TABS: 25.0000 ug | ORAL_TABLET | Freq: Every day | ORAL | 3 refills | Status: DC

## 2023-10-27 ENCOUNTER — Emergency Department
Admission: EM | Admit: 2023-10-27 | Discharge: 2023-10-27 | Disposition: A | Discharge status: Home / Self Care | Payer: MEDICAID | Attending: Emergency Medicine | Admitting: Emergency Medicine

## 2023-10-27 ENCOUNTER — Encounter: Payer: Self-pay | Admitting: Emergency Medicine

## 2023-10-27 ENCOUNTER — Emergency Department: Payer: MEDICAID

## 2023-10-27 DIAGNOSIS — F22 Delusional disorders: Secondary | ICD-10-CM | POA: Diagnosis not present

## 2023-10-27 DIAGNOSIS — I10 Essential (primary) hypertension: Secondary | ICD-10-CM | POA: Diagnosis not present

## 2023-10-27 DIAGNOSIS — E039 Hypothyroidism, unspecified: Secondary | ICD-10-CM | POA: Diagnosis not present

## 2023-10-27 DIAGNOSIS — F29 Unspecified psychosis not due to a substance or known physiological condition: Secondary | ICD-10-CM | POA: Insufficient documentation

## 2023-10-27 DIAGNOSIS — E119 Type 2 diabetes mellitus without complications: Secondary | ICD-10-CM | POA: Insufficient documentation

## 2023-10-27 DIAGNOSIS — Z85850 Personal history of malignant neoplasm of thyroid: Secondary | ICD-10-CM | POA: Diagnosis not present

## 2023-10-27 DIAGNOSIS — N939 Abnormal uterine and vaginal bleeding, unspecified: Secondary | ICD-10-CM | POA: Insufficient documentation

## 2023-10-27 DIAGNOSIS — J449 Chronic obstructive pulmonary disease, unspecified: Secondary | ICD-10-CM | POA: Diagnosis not present

## 2023-10-27 DIAGNOSIS — Z79899 Other long term (current) drug therapy: Secondary | ICD-10-CM | POA: Insufficient documentation

## 2023-10-27 DIAGNOSIS — J45909 Unspecified asthma, uncomplicated: Secondary | ICD-10-CM | POA: Diagnosis not present

## 2023-10-27 DIAGNOSIS — R7401 Elevation of levels of liver transaminase levels: Secondary | ICD-10-CM | POA: Diagnosis not present

## 2023-10-27 LAB — URINE DRUG SCREEN, QUALITATIVE (ARMC ONLY)
Amphetamines, Ur Screen: NOT DETECTED
Barbiturates, Ur Screen: NOT DETECTED
Benzodiazepine, Ur Scrn: NOT DETECTED
Cannabinoid 50 Ng, Ur ~~LOC~~: NOT DETECTED
Cocaine Metabolite,Ur ~~LOC~~: NOT DETECTED
MDMA (Ecstasy)Ur Screen: NOT DETECTED
Methadone Scn, Ur: NOT DETECTED
Opiate, Ur Screen: NOT DETECTED
Phencyclidine (PCP) Ur S: NOT DETECTED
Tricyclic, Ur Screen: POSITIVE — AB

## 2023-10-27 LAB — COMPREHENSIVE METABOLIC PANEL
ALT: 50 U/L — ABNORMAL HIGH (ref 0–44)
AST: 40 U/L (ref 15–41)
Albumin: 4.1 g/dL (ref 3.5–5.0)
Alkaline Phosphatase: 79 U/L (ref 38–126)
Anion gap: 7 (ref 5–15)
BUN: 11 mg/dL (ref 6–20)
CO2: 24 mmol/L (ref 22–32)
Calcium: 8.6 mg/dL — ABNORMAL LOW (ref 8.9–10.3)
Chloride: 106 mmol/L (ref 98–111)
Creatinine, Ser: 1.23 mg/dL — ABNORMAL HIGH (ref 0.44–1.00)
GFR, Estimated: 56 mL/min — ABNORMAL LOW (ref >60)
Glucose, Bld: 112 mg/dL — ABNORMAL HIGH (ref 70–99)
Potassium: 4 mmol/L (ref 3.5–5.1)
Sodium: 137 mmol/L (ref 135–145)
Total Bilirubin: 0.6 mg/dL (ref <1.2)
Total Protein: 7.3 g/dL (ref 6.5–8.1)

## 2023-10-27 LAB — URINALYSIS, ROUTINE W REFLEX MICROSCOPIC
Bacteria, UA: NONE SEEN
Bilirubin Urine: NEGATIVE
Glucose, UA: >=500 mg/dL — AB
Ketones, ur: NEGATIVE mg/dL
Leukocytes,Ua: NEGATIVE
Nitrite: NEGATIVE
Protein, ur: 30 mg/dL — AB
RBC / HPF: >50 RBC/hpf (ref 0–5)
Specific Gravity, Urine: 1.023 (ref 1.005–1.030)
pH: 5 (ref 5.0–8.0)

## 2023-10-27 LAB — CBC
HCT: 35.7 % — ABNORMAL LOW (ref 36.0–46.0)
Hemoglobin: 11.1 g/dL — ABNORMAL LOW (ref 12.0–15.0)
MCH: 25.5 pg — ABNORMAL LOW (ref 26.0–34.0)
MCHC: 31.1 g/dL (ref 30.0–36.0)
MCV: 81.9 fL (ref 80.0–100.0)
Platelets: 329 10*3/uL (ref 150–400)
RBC: 4.36 MIL/uL (ref 3.87–5.11)
RDW: 17.8 % — ABNORMAL HIGH (ref 11.5–15.5)
WBC: 6 10*3/uL (ref 4.0–10.5)
nRBC: 0 % (ref 0.0–0.2)

## 2023-10-27 LAB — ACETAMINOPHEN LEVEL: Acetaminophen (Tylenol), Serum: <10 ug/mL — ABNORMAL LOW (ref 10–30)

## 2023-10-27 LAB — ETHANOL: Alcohol, Ethyl (B): <10 mg/dL (ref <10)

## 2023-10-27 LAB — SALICYLATE LEVEL: Salicylate Lvl: <7 mg/dL — ABNORMAL LOW (ref 7.0–30.0)

## 2023-10-27 LAB — CBG MONITORING, ED: Glucose-Capillary: 90 mg/dL (ref 70–99)

## 2023-10-27 LAB — POC URINE PREG, ED: Preg Test, Ur: NEGATIVE

## 2023-10-27 MED ORDER — LAMOTRIGINE 100 MG PO TABS: 100.0000 mg | ORAL_TABLET | Freq: Every day | ORAL | Status: DC

## 2023-10-27 MED ORDER — LAMOTRIGINE 25 MG PO TABS: 75.0000 mg | ORAL_TABLET | Freq: Every day | ORAL | Status: DC

## 2023-10-27 MED ORDER — LAMOTRIGINE 25 MG PO TABS: 125.0000 mg | ORAL_TABLET | Freq: Every day | ORAL | Status: DC

## 2023-10-27 MED ORDER — LAMOTRIGINE 25 MG PO TABS: 125.0000 mg | ORAL_TABLET | Freq: Two times a day (BID) | ORAL | Status: DC

## 2023-10-27 MED ORDER — SPIRONOLACTONE 25 MG PO TABS
25.0000 mg | ORAL_TABLET | Freq: Every day | ORAL | Status: DC
Start: 2023-10-27 — End: 2023-10-27
  Administered 2023-10-27: 25 mg via ORAL
  Filled 2023-10-27: qty 1

## 2023-10-27 MED ORDER — LISINOPRIL 10 MG PO TABS
10.0000 mg | ORAL_TABLET | Freq: Every day | ORAL | Status: DC
Start: 2023-10-28 — End: 2023-10-27

## 2023-10-27 MED ORDER — LAMOTRIGINE 100 MG PO TABS: 100.0000 mg | ORAL_TABLET | Freq: Two times a day (BID) | ORAL | Status: DC

## 2023-10-27 MED ORDER — LISINOPRIL 10 MG PO TABS
10.0000 mg | ORAL_TABLET | Freq: Every day | ORAL | Status: DC
Start: 2023-10-27 — End: 2023-10-27

## 2023-10-27 MED ORDER — LIOTHYRONINE SODIUM 25 MCG PO TABS
25.0000 ug | ORAL_TABLET | Freq: Every day | ORAL | Status: DC
  Administered 2023-10-27: 25 ug via ORAL
  Filled 2023-10-27: qty 1

## 2023-10-27 MED ORDER — LAMOTRIGINE 25 MG PO TABS
75.0000 mg | ORAL_TABLET | Freq: Two times a day (BID) | ORAL | Status: DC
  Administered 2023-10-27: 75 mg via ORAL
  Filled 2023-10-27: qty 3

## 2023-10-27 MED ORDER — DROSPIRENONE 4 MG PO TABS: 1.0000 | ORAL_TABLET | Freq: Every day | ORAL | Status: DC

## 2023-10-27 MED ORDER — METOPROLOL SUCCINATE ER 50 MG PO TB24: 100.0000 mg | ORAL_TABLET | Freq: Every day | ORAL | Status: DC

## 2023-10-27 MED ORDER — PANTOPRAZOLE SODIUM 40 MG PO TBEC
40.0000 mg | DELAYED_RELEASE_TABLET | Freq: Two times a day (BID) | ORAL | Status: DC
  Administered 2023-10-27: 40 mg via ORAL
  Filled 2023-10-27: qty 1

## 2023-10-27 MED ORDER — DAPAGLIFLOZIN PROPANEDIOL 10 MG PO TABS: 10.0000 mg | ORAL_TABLET | Freq: Every day | ORAL | Status: DC

## 2023-10-27 MED ORDER — ROSUVASTATIN CALCIUM 20 MG PO TABS: 40.0000 mg | ORAL_TABLET | Freq: Every day | ORAL | Status: DC

## 2023-10-27 MED ORDER — AMITRIPTYLINE HCL 50 MG PO TABS: 100.0000 mg | ORAL_TABLET | Freq: Every day | ORAL | Status: DC

## 2023-10-27 MED ORDER — SERTRALINE HCL 50 MG PO TABS
100.0000 mg | ORAL_TABLET | Freq: Every day | ORAL | Status: DC
  Administered 2023-10-27: 100 mg via ORAL
  Filled 2023-10-27: qty 2

## 2023-10-27 MED ORDER — RIVAROXABAN 20 MG PO TABS
20.0000 mg | ORAL_TABLET | Freq: Every day | ORAL | Status: DC
  Filled 2023-10-27: qty 1

## 2023-10-27 NOTE — ED Notes (Signed)
Provided pt with breakfast tray

## 2023-10-27 NOTE — ED Notes (Signed)
Pt asking to shower, new clothes, underwear, a pad with toiletries retrieved for the patient, [place] ed tech assisting pt with getting her shower, pt concerned about her steri strips from her surg in oct, pt reassured that the shower would be ok for the steri strips

## 2023-10-27 NOTE — Discharge Instructions (Signed)
Please seek medical attention and help for any thoughts about wanting to harm yourself, harm others, any concerning change in behavior, severe depression, inappropriate drug use or any other new or concerning symptoms. ° °

## 2023-10-27 NOTE — ED Notes (Signed)
Pts mother took all of pts belongings home per request of patient.

## 2023-10-27 NOTE — ED Notes (Signed)
[facility]  PAPERS  RESCINDED PER  J LEE  NP

## 2023-10-27 NOTE — ED Triage Notes (Signed)
Pt provided with safe ride to ED by [employer] after pt called 911 due to fear of person/person's in her apartment. Per pt she has noticed over the last day that her hair has been but/shaved as well as her dogs hair. Pt reports that her dogs hair is shorter on one side and has not been that way. Pt also reports that she consumed non-alcoholic Eggnog that she feels does not taste "right." Pt reports that she has not seen anyone but sts, "I know someone must have a key and is coming in there doing things." Pt calm and cooperative in triage. Pt denies SI and HI. Recent right shoulder surgery in October.
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
## 2023-10-27 NOTE — ED Notes (Signed)
ivc by MD Ward/psych consult ordered/pending.

## 2023-10-27 NOTE — Consult Note (Signed)
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 health services. IVC rescinded at discharge.  Disposition: No evidence of imminent risk to self or others at present.   Patient does not meet criteria for psychiatric inpatient admission. Supportive therapy provided about ongoing stressors. Discussed crisis plan, support from social network, calling 911, coming to the Emergency Department, and calling Suicide Hotline.  Lauree Chandler, NP 10/27/2023 2:26 PM
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
## 2023-10-27 NOTE — ED Notes (Signed)
Pt was provided with shower supplies and clean clothes. Pt taking shower.

## 2023-10-27 NOTE — ED Notes (Signed)
Pt provided with lunch tray. Pt sitting up eating.

## 2023-10-27 NOTE — ED Notes (Signed)
Pt to Ultrasound with security and [place], [place]

## 2023-11-14 ENCOUNTER — Emergency Department
Admission: EM | Admit: 2023-11-14 | Discharge: 2023-11-14 | Disposition: A | Discharge status: Home / Self Care | Payer: MEDICAID | Attending: Emergency Medicine | Admitting: Emergency Medicine

## 2023-11-14 ENCOUNTER — Emergency Department: Payer: MEDICAID

## 2023-11-14 ENCOUNTER — Other Ambulatory Visit: Payer: Self-pay

## 2023-11-14 DIAGNOSIS — E119 Type 2 diabetes mellitus without complications: Secondary | ICD-10-CM | POA: Insufficient documentation

## 2023-11-14 DIAGNOSIS — J449 Chronic obstructive pulmonary disease, unspecified: Secondary | ICD-10-CM | POA: Insufficient documentation

## 2023-11-14 DIAGNOSIS — I10 Essential (primary) hypertension: Secondary | ICD-10-CM | POA: Diagnosis not present

## 2023-11-14 DIAGNOSIS — R569 Unspecified convulsions: Secondary | ICD-10-CM | POA: Diagnosis present

## 2023-11-14 LAB — CBC WITH DIFFERENTIAL/PLATELET
Abs Immature Granulocytes: 0.05 10*3/uL (ref 0.00–0.07)
Basophils Absolute: 0 10*3/uL (ref 0.0–0.1)
Basophils Relative: 0 %
Eosinophils Absolute: 0.1 10*3/uL (ref 0.0–0.5)
Eosinophils Relative: 2 %
HCT: 35.5 % — ABNORMAL LOW (ref 36.0–46.0)
Hemoglobin: 10.9 g/dL — ABNORMAL LOW (ref 12.0–15.0)
Immature Granulocytes: 1 %
Lymphocytes Relative: 27 %
Lymphs Abs: 2 10*3/uL (ref 0.7–4.0)
MCH: 25.1 pg — ABNORMAL LOW (ref 26.0–34.0)
MCHC: 30.7 g/dL (ref 30.0–36.0)
MCV: 81.6 fL (ref 80.0–100.0)
Monocytes Absolute: 0.8 10*3/uL (ref 0.1–1.0)
Monocytes Relative: 10 %
Neutro Abs: 4.4 10*3/uL (ref 1.7–7.7)
Neutrophils Relative %: 60 %
Platelets: 260 10*3/uL (ref 150–400)
RBC: 4.35 MIL/uL (ref 3.87–5.11)
RDW: 17.4 % — ABNORMAL HIGH (ref 11.5–15.5)
WBC: 7.3 10*3/uL (ref 4.0–10.5)
nRBC: 0 % (ref 0.0–0.2)

## 2023-11-14 LAB — COMPREHENSIVE METABOLIC PANEL
ALT: 67 U/L — ABNORMAL HIGH (ref 0–44)
AST: 50 U/L — ABNORMAL HIGH (ref 15–41)
Albumin: 4 g/dL (ref 3.5–5.0)
Alkaline Phosphatase: 91 U/L (ref 38–126)
Anion gap: 13 (ref 5–15)
BUN: 11 mg/dL (ref 6–20)
CO2: 20 mmol/L — ABNORMAL LOW (ref 22–32)
Calcium: 8.8 mg/dL — ABNORMAL LOW (ref 8.9–10.3)
Chloride: 103 mmol/L (ref 98–111)
Creatinine, Ser: 1.21 mg/dL — ABNORMAL HIGH (ref 0.44–1.00)
GFR, Estimated: 57 mL/min — ABNORMAL LOW (ref >60)
Glucose, Bld: 97 mg/dL (ref 70–99)
Potassium: 4.1 mmol/L (ref 3.5–5.1)
Sodium: 136 mmol/L (ref 135–145)
Total Bilirubin: 0.5 mg/dL (ref <1.2)
Total Protein: 7.4 g/dL (ref 6.5–8.1)

## 2023-11-14 LAB — SALICYLATE LEVEL: Salicylate Lvl: <7 mg/dL — ABNORMAL LOW (ref 7.0–30.0)

## 2023-11-14 LAB — URINALYSIS, W/ REFLEX TO CULTURE (INFECTION SUSPECTED)
Bacteria, UA: NONE SEEN
Bilirubin Urine: NEGATIVE
Glucose, UA: >=500 mg/dL — AB
Hgb urine dipstick: NEGATIVE
Ketones, ur: NEGATIVE mg/dL
Leukocytes,Ua: NEGATIVE
Nitrite: NEGATIVE
Protein, ur: NEGATIVE mg/dL
Specific Gravity, Urine: 1.024 (ref 1.005–1.030)
pH: 6 (ref 5.0–8.0)

## 2023-11-14 LAB — CBG MONITORING, ED
Glucose-Capillary: 70 mg/dL (ref 70–99)
Glucose-Capillary: 79 mg/dL (ref 70–99)

## 2023-11-14 LAB — URINE DRUG SCREEN, QUALITATIVE (ARMC ONLY)
Amphetamines, Ur Screen: NOT DETECTED
Barbiturates, Ur Screen: NOT DETECTED
Benzodiazepine, Ur Scrn: NOT DETECTED
Cannabinoid 50 Ng, Ur ~~LOC~~: NOT DETECTED
Cocaine Metabolite,Ur ~~LOC~~: NOT DETECTED
MDMA (Ecstasy)Ur Screen: NOT DETECTED
Methadone Scn, Ur: NOT DETECTED
Opiate, Ur Screen: NOT DETECTED
Phencyclidine (PCP) Ur S: NOT DETECTED
Tricyclic, Ur Screen: POSITIVE — AB

## 2023-11-14 LAB — MAGNESIUM: Magnesium: 2.5 mg/dL — ABNORMAL HIGH (ref 1.7–2.4)

## 2023-11-14 LAB — POC URINE PREG, ED: Preg Test, Ur: NEGATIVE

## 2023-11-14 LAB — ETHANOL: Alcohol, Ethyl (B): <10 mg/dL (ref <10)

## 2023-11-14 LAB — TSH: TSH: 2.699 u[IU]/mL (ref 0.350–4.500)

## 2023-11-14 LAB — T4, FREE: Free T4: 0.6 ng/dL — ABNORMAL LOW (ref 0.61–1.12)

## 2023-11-14 LAB — HCG, QUANTITATIVE, PREGNANCY: hCG, Beta Chain, Quant, S: <1 m[IU]/mL (ref <5)

## 2023-11-14 LAB — ACETAMINOPHEN LEVEL: Acetaminophen (Tylenol), Serum: <10 ug/mL — ABNORMAL LOW (ref 10–30)

## 2023-11-14 MED ORDER — LIDOCAINE VISCOUS HCL 2 % MT SOLN: 5.0000 mL | Freq: Three times a day (TID) | OROMUCOSAL | 0 refills | Status: DC | PRN

## 2023-11-14 MED ORDER — LIDOCAINE VISCOUS HCL 2 % MT SOLN
15.0000 mL | Freq: Once | OROMUCOSAL | Status: AC
  Administered 2023-11-14: 15 mL via OROMUCOSAL
  Filled 2023-11-14: qty 15

## 2023-11-14 MED ORDER — KETOROLAC TROMETHAMINE 15 MG/ML IJ SOLN
15.0000 mg | Freq: Once | INTRAMUSCULAR | Status: AC
  Administered 2023-11-14: 15 mg via INTRAVENOUS
  Filled 2023-11-14: qty 1

## 2023-11-14 MED ORDER — ACETAMINOPHEN 500 MG PO TABS
1000.0000 mg | ORAL_TABLET | Freq: Once | ORAL | Status: AC
  Administered 2023-11-14: 1000 mg via ORAL
  Filled 2023-11-14: qty 2

## 2023-11-14 NOTE — Discharge Instructions (Addendum)
Fortunately your testing in the emergency department did not show any emergency conditions that accounted for your seizure today.  Continue taking your seizure medications as prescribed by your neurologist and call them for a follow-up appointment.  For your tongue injury you can use the mouthwash as prescribed for you  Thank you for choosing [place] for your health care today!  Please see your primary doctor this week for a follow up appointment.   If you have any new, worsening, or unexpected symptoms call your doctor right away or come back to the emergency department for reevaluation.  It was my pleasure to care for you today.   Daneil Dan Modesto Charon, MD

## 2023-11-14 NOTE — ED Notes (Signed)
Patient up to restroom to obtain UA sample

## 2023-11-14 NOTE — ED Notes (Signed)
Patients friend "Deb" at bedside. Patient said that this guest/visitor was okay to come back to sit with her. She states "I so not want my mom to come back".
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
## 2023-11-14 NOTE — ED Provider Notes (Signed)
[facility] Provider Note    None    (approximate)   History   Seizures   HPI  Kristina Li is a 42 y.o. female   Past medical history of seizures on Lamictal, chronic pain, COPD, diabetes, hypothyroid, PTSD, hypertension who presents emergency department with seizure activity.  She was at a drive-through [employer] and had a witnessed 3 seizure-like activity lasting 1 minute apiece and bit her tongue.  She does not recall having a seizure.  She was altered and confused afterwards but is rapidly improving.  She has a history of seizures has been worked up with MRI of the brain which has been normal, as well as a 72-hour EEG which has been normal.  She is on Lamictal and is titrating upwards to achieve optimal dosing with the help of Dr. Malvin Johns of neurology.  She has been fully compliant with his medications.  She denies any drug or alcohol use.  She denies any recent illnesses or trauma.  She states that she has had emotional distress recently due to family members and family drama.  She is on a blood thinner for VTE.   External Medical Documents Reviewed: Dr. Malvin Johns of neurology's recent notes for seizure workup, also recent MRI of the brain which is normal, and a telephone encounter with Dr. Malvin Johns noting medication Lamictal as well as normal 72-hour EEG recently      Physical Exam   Triage Vital Signs: ED Triage Vitals  Encounter Vitals Group     BP 11/14/23 1251 124/70     Systolic BP Percentile --      Diastolic BP Percentile --      Pulse Rate 11/14/23 1251 100     Resp 11/14/23 1251 16     Temp 11/14/23 1251 98.2 F (36.8 C)     Temp src --      SpO2 11/14/23 1251 97 %     Weight --      Height --      Head Circumference --      Peak Flow --      Pain Score 11/14/23 1252 5     Pain Loc --      Pain Education --      Exclude from Growth Chart --     Most recent vital signs: Vitals:   11/14/23 1251 11/14/23 1458  BP:  124/70 127/88  Pulse: 100 86  Resp: 16 16  Temp: 98.2 F (36.8 C)   SpO2: 97% 97%    General: Awake, no distress.  CV:  Good peripheral perfusion.  Resp:  Normal effort.  Abd:  No distention.  Other:  Awake alert comfortable appearing with normal vital signs.  Moving all extremities with full active range of motion, no facial asymmetry, no dysarthria, and oriented.  Soft nontender abdomen.  Clear lungs.  No obvious head trauma.  She does have a laceration small of the right side of her tongue.   ED Results / Procedures / Treatments   Labs (all labs ordered are listed, but only abnormal results are displayed) Labs Reviewed  COMPREHENSIVE METABOLIC PANEL - Abnormal; Notable for the following components:      Result Value   CO2 20 (*)    Creatinine, Ser 1.21 (*)    Calcium 8.8 (*)    AST 50 (*)    ALT 67 (*)    GFR, Estimated 57 (*)    All other components within normal limits  ACETAMINOPHEN  LEVEL - Abnormal; Notable for the following components:   Acetaminophen (Tylenol), Serum <10 (*)    All other components within normal limits  SALICYLATE LEVEL - Abnormal; Notable for the following components:   Salicylate Lvl <7.0 (*)    All other components within normal limits  CBC WITH DIFFERENTIAL/PLATELET - Abnormal; Notable for the following components:   Hemoglobin 10.9 (*)    HCT 35.5 (*)    MCH 25.1 (*)    RDW 17.4 (*)    All other components within normal limits  MAGNESIUM - Abnormal; Notable for the following components:   Magnesium 2.5 (*)    All other components within normal limits  T4, FREE - Abnormal; Notable for the following components:   Free T4 0.60 (*)    All other components within normal limits  URINALYSIS, W/ REFLEX TO CULTURE (INFECTION SUSPECTED) - Abnormal; Notable for the following components:   Color, Urine YELLOW (*)    APPearance CLEAR (*)    Glucose, UA >=500 (*)    All other components within normal limits  URINE DRUG SCREEN, QUALITATIVE ([facility]  ONLY) - Abnormal; Notable for the following components:   Tricyclic, Ur Screen POSITIVE (*)    All other components within normal limits  ETHANOL  TSH  HCG, QUANTITATIVE, PREGNANCY  LAMOTRIGINE LEVEL  POC URINE PREG, ED  CBG MONITORING, ED  CBG MONITORING, ED     I ordered and reviewed the above labs they are notable for cell counts and electrolytes largely unremarkable  EKG  ED ECG REPORT I, Pilar Jarvis, the attending physician, personally viewed and interpreted this ECG.   Date: 11/14/2023  EKG Time: 1315  Rate: 98  Rhythm: normal EKG, normal sinus rhythm  Axis: nl  Intervals:none  ST&T Change: no stemi    RADIOLOGY I independently reviewed and interpreted CT of the head see no obvious bleeding or midline shift I also reviewed radiologist's formal read.   PROCEDURES:  Critical Care performed: No  Procedures   MEDICATIONS ORDERED IN ED: Medications  ketorolac (TORADOL) 15 MG/ML injection 15 mg (15 mg Intravenous Given 11/14/23 1416)  acetaminophen (TYLENOL) tablet 1,000 mg (1,000 mg Oral Given 11/14/23 1414)  lidocaine (XYLOCAINE) 2 % viscous mouth solution 15 mL (15 mLs Mouth/Throat Given 11/14/23 1416)    IMPRESSION / MDM / ASSESSMENT AND PLAN / ED COURSE  I reviewed the triage vital signs and the nursing notes.                                Patient's presentation is most consistent with acute presentation with potential threat to life or bodily function.  Differential diagnosis includes, but is not limited to, seizure, electrolyte disturbance, metabolic derangement, dysrhythmia, thyroid dysfunction, ICH   The patient is on the cardiac monitor to evaluate for evidence of arrhythmia and/or significant heart rate changes.  MDM:    Patient with seizure and breakthrough seizure despite compliance with medications, in the setting of psychological stressors with family issues recently.  She denies drug or alcohol use does not appear to be intoxicated or in  acute withdrawal.  She denies obvious head strike or headache and no obvious trauma but with potential head injury during her seizure activity on anticoagulation with breakthrough seizure we will check a CT head.  Check electrolytes, toxicologic labs, send out a lamotrigine level.  Ultimately if workup is unremarkable and patient remains stable, asymptomatic with no further seizure activities,  plan will be for discharge ongoing medication titration and follow-up with neurology.      FINAL CLINICAL IMPRESSION(S) / ED DIAGNOSES   Final diagnoses:  Seizure (HCC)     Rx / DC Orders   ED Discharge Orders          Ordered    magic mouthwash (lidocaine, diphenhydrAMINE, alum & mag hydroxide) suspension  3 times daily PRN        11/14/23 1422             Note:  This document was prepared using Dragon voice recognition software and may include unintentional dictation errors.    Pilar Jarvis, MD 11/14/23 [phone]

## 2023-11-16 LAB — LAMOTRIGINE LEVEL: Lamotrigine Lvl: 10 ug/mL (ref 2.0–20.0)

## 2023-11-17 ENCOUNTER — Other Ambulatory Visit: Payer: Self-pay | Admitting: Cardiovascular Disease

## 2023-11-18 ENCOUNTER — Telehealth: Payer: Self-pay

## 2023-11-21 ENCOUNTER — Emergency Department
Admission: EM | Admit: 2023-11-21 | Discharge: 2023-11-21 | Discharge status: Against Medical Advice | Payer: MEDICAID | Attending: Emergency Medicine | Admitting: Emergency Medicine

## 2023-11-21 ENCOUNTER — Encounter: Payer: Self-pay | Admitting: Emergency Medicine

## 2023-11-21 ENCOUNTER — Other Ambulatory Visit: Payer: Self-pay

## 2023-11-21 DIAGNOSIS — R440 Auditory hallucinations: Secondary | ICD-10-CM | POA: Insufficient documentation

## 2023-11-21 DIAGNOSIS — Z5321 Procedure and treatment not carried out due to patient leaving prior to being seen by health care provider: Secondary | ICD-10-CM | POA: Diagnosis not present

## 2023-11-21 DIAGNOSIS — T59811A Toxic effect of smoke, accidental (unintentional), initial encounter: Secondary | ICD-10-CM | POA: Insufficient documentation

## 2023-11-21 LAB — CBC
HCT: 35.7 % — ABNORMAL LOW (ref 36.0–46.0)
Hemoglobin: 11.3 g/dL — ABNORMAL LOW (ref 12.0–15.0)
MCH: 25.2 pg — ABNORMAL LOW (ref 26.0–34.0)
MCHC: 31.7 g/dL (ref 30.0–36.0)
MCV: 79.7 fL — ABNORMAL LOW (ref 80.0–100.0)
Platelets: 295 10*3/uL (ref 150–400)
RBC: 4.48 MIL/uL (ref 3.87–5.11)
RDW: 17.4 % — ABNORMAL HIGH (ref 11.5–15.5)
WBC: 8 10*3/uL (ref 4.0–10.5)
nRBC: 0 % (ref 0.0–0.2)

## 2023-11-21 LAB — ACETAMINOPHEN LEVEL: Acetaminophen (Tylenol), Serum: <10 ug/mL — ABNORMAL LOW (ref 10–30)

## 2023-11-21 LAB — URINE DRUG SCREEN, QUALITATIVE (ARMC ONLY)
Amphetamines, Ur Screen: NOT DETECTED
Barbiturates, Ur Screen: NOT DETECTED
Benzodiazepine, Ur Scrn: NOT DETECTED
Cannabinoid 50 Ng, Ur ~~LOC~~: NOT DETECTED
Cocaine Metabolite,Ur ~~LOC~~: NOT DETECTED
MDMA (Ecstasy)Ur Screen: NOT DETECTED
Methadone Scn, Ur: NOT DETECTED
Opiate, Ur Screen: NOT DETECTED
Phencyclidine (PCP) Ur S: NOT DETECTED
Tricyclic, Ur Screen: POSITIVE — AB

## 2023-11-21 LAB — COOXEMETRY PANEL
Carboxyhemoglobin: <0.3 % — ABNORMAL LOW (ref 0.5–1.5)
Methemoglobin: <0.7 % (ref 0.0–1.5)
O2 Saturation: 97.1 %
Total hemoglobin: 11.8 g/dL — ABNORMAL LOW (ref 12.0–16.0)
Total oxygen content: 96.8 %

## 2023-11-21 LAB — COMPREHENSIVE METABOLIC PANEL
ALT: 63 U/L — ABNORMAL HIGH (ref 0–44)
AST: 48 U/L — ABNORMAL HIGH (ref 15–41)
Albumin: 4 g/dL (ref 3.5–5.0)
Alkaline Phosphatase: 89 U/L (ref 38–126)
Anion gap: 7 (ref 5–15)
BUN: 13 mg/dL (ref 6–20)
CO2: 25 mmol/L (ref 22–32)
Calcium: 9.1 mg/dL (ref 8.9–10.3)
Chloride: 105 mmol/L (ref 98–111)
Creatinine, Ser: 1.08 mg/dL — ABNORMAL HIGH (ref 0.44–1.00)
GFR, Estimated: >60 mL/min (ref >60)
Glucose, Bld: 114 mg/dL — ABNORMAL HIGH (ref 70–99)
Potassium: 4.4 mmol/L (ref 3.5–5.1)
Sodium: 137 mmol/L (ref 135–145)
Total Bilirubin: <0.2 mg/dL (ref <1.2)
Total Protein: 7.7 g/dL (ref 6.5–8.1)

## 2023-11-21 LAB — BLOOD GAS, ARTERIAL
Acid-base deficit: 0.8 mmol/L (ref 0.0–2.0)
Bicarbonate: 23.5 mmol/L (ref 20.0–28.0)
O2 Saturation: 97.1 %
Patient temperature: 37
pCO2 arterial: 37 mm[Hg] (ref 32–48)
pH, Arterial: 7.41 (ref 7.35–7.45)
pO2, Arterial: 85 mm[Hg] (ref 83–108)

## 2023-11-21 LAB — PREGNANCY, URINE: Preg Test, Ur: NEGATIVE

## 2023-11-21 LAB — SALICYLATE LEVEL: Salicylate Lvl: <7 mg/dL — ABNORMAL LOW (ref 7.0–30.0)

## 2023-11-21 LAB — ETHANOL: Alcohol, Ethyl (B): <10 mg/dL (ref <10)
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
## 2023-11-21 NOTE — ED Notes (Addendum)
Kristina Li, EDT attempted to call patients phone number listed in chart x2 with no answer. Pt is not seen in the lobby and has been called x3 in lobby with no answer.

## 2023-11-21 NOTE — ED Notes (Signed)
Per Dr. Katrinka Blazing only blood work at this time.

## 2023-12-03 ENCOUNTER — Encounter: Payer: Self-pay | Admitting: Oncology

## 2024-01-12 ENCOUNTER — Other Ambulatory Visit: Payer: Self-pay | Admitting: Cardiology

## 2024-01-12 ENCOUNTER — Other Ambulatory Visit: Payer: Self-pay | Admitting: Cardiovascular Disease

## 2024-01-13 ENCOUNTER — Other Ambulatory Visit: Payer: Self-pay | Admitting: Internal Medicine

## 2024-01-14 MED ORDER — DAPAGLIFLOZIN PROPANEDIOL 10 MG PO TABS: 10.0000 mg | ORAL_TABLET | Freq: Every day | ORAL | 3 refills | Status: DC

## 2024-01-20 ENCOUNTER — Other Ambulatory Visit: Payer: MEDICAID

## 2024-01-20 DIAGNOSIS — I10 Essential (primary) hypertension: Secondary | ICD-10-CM

## 2024-01-20 DIAGNOSIS — Z13220 Encounter for screening for lipoid disorders: Secondary | ICD-10-CM

## 2024-01-20 DIAGNOSIS — Z131 Encounter for screening for diabetes mellitus: Secondary | ICD-10-CM

## 2024-01-21 LAB — CBC WITH DIFF/PLATELET
Basophils Absolute: 0 10*3/uL (ref 0.0–0.2)
Basos: 0 %
EOS (ABSOLUTE): 0.1 10*3/uL (ref 0.0–0.4)
Eos: 1 %
Hematocrit: 33.6 % — ABNORMAL LOW (ref 34.0–46.6)
Hemoglobin: 10.1 g/dL — ABNORMAL LOW (ref 11.1–15.9)
Immature Grans (Abs): 0 10*3/uL (ref 0.0–0.1)
Immature Granulocytes: 0 %
Lymphocytes Absolute: 1.6 10*3/uL (ref 0.7–3.1)
Lymphs: 22 %
MCH: 23.3 pg — ABNORMAL LOW (ref 26.6–33.0)
MCHC: 30.1 g/dL — ABNORMAL LOW (ref 31.5–35.7)
MCV: 77 fL — ABNORMAL LOW (ref 79–97)
Monocytes Absolute: 0.9 10*3/uL (ref 0.1–0.9)
Monocytes: 12 %
Neutrophils Absolute: 4.6 10*3/uL (ref 1.4–7.0)
Neutrophils: 65 %
Platelets: 431 10*3/uL (ref 150–450)
RBC: 4.34 x10E6/uL (ref 3.77–5.28)
RDW: 16.7 % — ABNORMAL HIGH (ref 11.7–15.4)
WBC: 7.3 10*3/uL (ref 3.4–10.8)

## 2024-01-21 LAB — HEMOGLOBIN A1C
Est. average glucose Bld gHb Est-mCnc: 128 mg/dL
Hgb A1c MFr Bld: 6.1 % — ABNORMAL HIGH (ref 4.8–5.6)

## 2024-01-21 LAB — CMP14+EGFR
ALT: 80 [IU]/L — ABNORMAL HIGH (ref 0–32)
AST: 45 [IU]/L — ABNORMAL HIGH (ref 0–40)
Albumin: 4.2 g/dL (ref 3.9–4.9)
Alkaline Phosphatase: 111 [IU]/L (ref 44–121)
BUN/Creatinine Ratio: 11 (ref 9–23)
BUN: 12 mg/dL (ref 6–24)
Bilirubin Total: <0.2 mg/dL (ref 0.0–1.2)
CO2: 24 mmol/L (ref 20–29)
Calcium: 9.2 mg/dL (ref 8.7–10.2)
Chloride: 104 mmol/L (ref 96–106)
Creatinine, Ser: 1.13 mg/dL — ABNORMAL HIGH (ref 0.57–1.00)
Globulin, Total: 2.5 g/dL (ref 1.5–4.5)
Glucose: 79 mg/dL (ref 70–99)
Potassium: 4 mmol/L (ref 3.5–5.2)
Sodium: 142 mmol/L (ref 134–144)
Total Protein: 6.7 g/dL (ref 6.0–8.5)
eGFR: 62 mL/min/{1.73_m2} (ref >59)

## 2024-01-21 LAB — LIPID PANEL
Chol/HDL Ratio: 2.4 {ratio} (ref 0.0–4.4)
Cholesterol, Total: 140 mg/dL (ref 100–199)
HDL: 58 mg/dL (ref >39)
LDL Chol Calc (NIH): 70 mg/dL (ref 0–99)
Triglycerides: 59 mg/dL (ref 0–149)
VLDL Cholesterol Cal: 12 mg/dL (ref 5–40)

## 2024-01-25 ENCOUNTER — Encounter: Payer: Self-pay | Admitting: Cardiology

## 2024-01-25 ENCOUNTER — Encounter: Payer: Self-pay | Admitting: Oncology

## 2024-01-25 ENCOUNTER — Telehealth: Payer: Self-pay | Admitting: Internal Medicine

## 2024-01-25 ENCOUNTER — Ambulatory Visit (INDEPENDENT_AMBULATORY_CARE_PROVIDER_SITE_OTHER): Payer: MEDICAID | Admitting: Cardiology

## 2024-01-25 VITALS — BP 100/60 | HR 90 | Ht 66.0 in | Wt 241.0 lb

## 2024-01-25 DIAGNOSIS — R7303 Prediabetes: Secondary | ICD-10-CM

## 2024-01-25 DIAGNOSIS — L258 Unspecified contact dermatitis due to other agents: Secondary | ICD-10-CM

## 2024-01-25 DIAGNOSIS — Z114 Encounter for screening for human immunodeficiency virus [HIV]: Secondary | ICD-10-CM

## 2024-01-25 DIAGNOSIS — D509 Iron deficiency anemia, unspecified: Secondary | ICD-10-CM

## 2024-01-25 DIAGNOSIS — I10 Essential (primary) hypertension: Secondary | ICD-10-CM

## 2024-01-25 DIAGNOSIS — E039 Hypothyroidism, unspecified: Secondary | ICD-10-CM

## 2024-01-25 MED ORDER — HYDROCORTISONE 1 % EX CREA: TOPICAL_CREAM | CUTANEOUS | 1 refills | Status: DC

## 2024-01-25 NOTE — Progress Notes (Signed)
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 result due to possible interfering substances. A more specific alternate chemical method must be used in order to obtain a confirmed analytical result. Gas chromatography / mass spectrometry (GC/MS) is the preferred confirm atory method. Performed at [facility], [address] [place], [place] [address]   Pregnancy, urine     Status: None   Collection Time: 11/21/23  4:57 AM  Result Value Ref  Range   Preg Test, Ur NEGATIVE NEGATIVE    Comment:        THE SENSITIVITY OF THIS METHODOLOGY IS >25 mIU/mL. Performed at [facility], [address] [place], [place] [address]   Blood gas, arterial     Status: None   Collection Time: 11/21/23  5:16 AM  Result Value Ref Range   pH, Arterial 7.41 7.35 - 7.45   pCO2 arterial 37 32 - 48 mmHg   pO2, Arterial 85 83 - 108 mmHg   Bicarbonate 23.5 20.0 - 28.0 mmol/L   Acid-base deficit 0.8 0.0 - 2.0 mmol/L   O2 Saturation 97.1 %   Patient temperature 37.0    Collection site RIGHT RADIAL    Allens test (pass/fail) PASS PASS    Comment: Performed at [facility], [address], [place], [place] [address]  Cooxemetry Panel, hospital-performed (carboxy, met, total hgb, O2 sat)     Status: Abnormal   Collection Time: 11/21/23  5:17 AM  Result Value Ref Range   Total hemoglobin 11.8 (L) 12.0 - 16.0 g/dL   O2 Saturation 91.4 %   Carboxyhemoglobin <0.3 (L) 0.5 - 1.5 %   Methemoglobin <0.7 0.0 - 1.5 %   Total oxygen content 96.8 %    Comment: Performed at [facility], [address] [place], [place] [address]  CMP14+EGFR     Status: Abnormal   Collection Time: 01/20/24  2:59 PM  Result Value Ref Range   Glucose 79 70 - 99 mg/dL   BUN 12 6 - 24 mg/dL   Creatinine, Ser 6.21 (H) 0.57 - 1.00 mg/dL   eGFR 62 >30 QM/VHQ/4.69   BUN/Creatinine Ratio 11 9 - 23   Sodium 142 134 - 144 mmol/L   Potassium 4.0 3.5 - 5.2 mmol/L   Chloride 104 96 -  106 mmol/L   CO2 24 20 - 29 mmol/L   Calcium 9.2 8.7 - 10.2 mg/dL   Total Protein 6.7 6.0 - 8.5 g/dL   Albumin 4.2 3.9 - 4.9 g/dL   Globulin, Total 2.5 1.5 - 4.5 g/dL   Bilirubin Total <3.2 0.0 - 1.2 mg/dL   Alkaline Phosphatase 111 44 - 121 IU/L   AST 45 (H) 0 - 40 IU/L   ALT 80 (H) 0 - 32 IU/L  Hemoglobin A1c     Status: Abnormal   Collection Time: 01/20/24  2:59 PM  Result Value Ref Range   Hgb A1c MFr Bld 6.1 (H) 4.8 - 5.6 %    Comment:          Prediabetes: 5.7  - 6.4          Diabetes: >6.4          Glycemic control for adults with diabetes: <7.0    Est. average glucose Bld gHb Est-mCnc 128 mg/dL  CBC With Diff/Platelet     Status: Abnormal   Collection Time: 01/20/24  2:59 PM  Result Value Ref Range   WBC 7.3 3.4 - 10.8 x10E3/uL   RBC 4.34 3.77 - 5.28 x10E6/uL   Hemoglobin 10.1 (L) 11.1 - 15.9 g/dL   Hematocrit 95.1 (L) 88.4 - 46.6 %   MCV 77 (L) 79 - 97 fL   MCH 23.3 (L) 26.6 - 33.0 pg   MCHC 30.1 (L) 31.5 - 35.7 g/dL   RDW 16.6 (H) 06.3 - 01.6 %   Platelets 431 150 - 450 x10E3/uL   Neutrophils 65 Not Estab. %   Lymphs 22 Not Estab. %   Monocytes 12 Not Estab. %   Eos 1 Not Estab. %   Basos 0 Not Estab. %   Neutrophils Absolute 4.6 1.4 - 7.0 x10E3/uL   Lymphocytes Absolute 1.6 0.7 - 3.1 x10E3/uL   Monocytes Absolute 0.9 0.1 - 0.9 x10E3/uL   EOS (ABSOLUTE) 0.1 0.0 - 0.4 x10E3/uL   Basophils Absolute 0.0 0.0 - 0.2 x10E3/uL   Immature Granulocytes 0 Not Estab. %   Immature Grans (Abs) 0.0 0.0 - 0.1 x10E3/uL  Lipid panel     Status: None   Collection Time: 01/20/24  2:59 PM  Result Value Ref Range   Cholesterol, Total 140 100 - 199 mg/dL   Triglycerides 59 0 - 149 mg/dL   HDL 58 >01 mg/dL   VLDL Cholesterol Cal 12 5 - 40 mg/dL   LDL Chol Calc (NIH) 70 0 - 99 mg/dL   Chol/HDL Ratio 2.4 0.0 - 4.4 ratio    Comment:                                   T. Chol/HDL Ratio                                             Men  Women                               1/2 Avg.Risk  3.4    3.3  Avg.Risk  5.0    4.4                                2X Avg.Risk  9.6    7.1                                3X Avg.Risk 23.4   11.0       Assessment & Plan:  Referral to hematology placed for ongoing anemia.  Referral to dermatology.  HIV blood test ordered.   Problem List Items Addressed This Visit       Cardiovascular and Mediastinum   HTN (hypertension) - Primary     Other   Iron deficiency anemia   Relevant  Orders   Ambulatory referral to Hematology / Oncology   Other Visit Diagnoses       Encounter for screening for HIV       Relevant Orders   HIV antibody (with reflex)     Hypothyroidism, unspecified type       Relevant Orders   TSH     Contact dermatitis due to other agent, unspecified contact dermatitis type       Relevant Orders   Ambulatory referral to Dermatology       Return in about 4 months (around 05/24/2024) for with fasting lab work prior.   Total time spent: 25 minutes  [employer], NP  01/25/2024   This document may have been prepared by Dragon Voice Recognition software and as such may include unintentional dictation errors.

## 2024-01-25 NOTE — Telephone Encounter (Signed)
Patient called in wanting to be seen and get a dermatology referral. Transferred call to front desk to schedule.

## 2024-01-26 ENCOUNTER — Inpatient Hospital Stay: Payer: MEDICAID | Attending: Oncology | Admitting: Oncology

## 2024-01-26 ENCOUNTER — Encounter: Payer: Self-pay | Admitting: Oncology

## 2024-01-26 ENCOUNTER — Other Ambulatory Visit: Payer: Self-pay | Admitting: Cardiology

## 2024-01-26 ENCOUNTER — Ambulatory Visit: Payer: MEDICAID | Admitting: Cardiology

## 2024-01-26 ENCOUNTER — Inpatient Hospital Stay: Payer: MEDICAID

## 2024-01-26 VITALS — BP 125/72 | HR 88 | Temp 99.4°F | Resp 18 | Ht 66.0 in | Wt 243.0 lb

## 2024-01-26 DIAGNOSIS — Z85850 Personal history of malignant neoplasm of thyroid: Secondary | ICD-10-CM | POA: Insufficient documentation

## 2024-01-26 DIAGNOSIS — Z833 Family history of diabetes mellitus: Secondary | ICD-10-CM | POA: Diagnosis not present

## 2024-01-26 DIAGNOSIS — Z9089 Acquired absence of other organs: Secondary | ICD-10-CM | POA: Diagnosis not present

## 2024-01-26 DIAGNOSIS — Z9049 Acquired absence of other specified parts of digestive tract: Secondary | ICD-10-CM | POA: Insufficient documentation

## 2024-01-26 DIAGNOSIS — Z818 Family history of other mental and behavioral disorders: Secondary | ICD-10-CM | POA: Diagnosis not present

## 2024-01-26 DIAGNOSIS — Z8614 Personal history of Methicillin resistant Staphylococcus aureus infection: Secondary | ICD-10-CM | POA: Insufficient documentation

## 2024-01-26 DIAGNOSIS — Z811 Family history of alcohol abuse and dependence: Secondary | ICD-10-CM | POA: Insufficient documentation

## 2024-01-26 DIAGNOSIS — Z79899 Other long term (current) drug therapy: Secondary | ICD-10-CM | POA: Diagnosis not present

## 2024-01-26 DIAGNOSIS — Z8261 Family history of arthritis: Secondary | ICD-10-CM | POA: Diagnosis not present

## 2024-01-26 DIAGNOSIS — Z823 Family history of stroke: Secondary | ICD-10-CM | POA: Diagnosis not present

## 2024-01-26 DIAGNOSIS — Z7901 Long term (current) use of anticoagulants: Secondary | ICD-10-CM | POA: Diagnosis not present

## 2024-01-26 DIAGNOSIS — Z888 Allergy status to other drugs, medicaments and biological substances status: Secondary | ICD-10-CM | POA: Diagnosis not present

## 2024-01-26 DIAGNOSIS — Z825 Family history of asthma and other chronic lower respiratory diseases: Secondary | ICD-10-CM | POA: Diagnosis not present

## 2024-01-26 DIAGNOSIS — Z8249 Family history of ischemic heart disease and other diseases of the circulatory system: Secondary | ICD-10-CM | POA: Diagnosis not present

## 2024-01-26 DIAGNOSIS — F32A Depression, unspecified: Secondary | ICD-10-CM | POA: Insufficient documentation

## 2024-01-26 DIAGNOSIS — D509 Iron deficiency anemia, unspecified: Secondary | ICD-10-CM | POA: Diagnosis present

## 2024-01-26 DIAGNOSIS — Z79890 Hormone replacement therapy (postmenopausal): Secondary | ICD-10-CM | POA: Diagnosis not present

## 2024-01-26 DIAGNOSIS — F4310 Post-traumatic stress disorder, unspecified: Secondary | ICD-10-CM | POA: Diagnosis not present

## 2024-01-26 DIAGNOSIS — Z809 Family history of malignant neoplasm, unspecified: Secondary | ICD-10-CM | POA: Insufficient documentation

## 2024-01-26 DIAGNOSIS — I10 Essential (primary) hypertension: Secondary | ICD-10-CM | POA: Insufficient documentation

## 2024-01-26 DIAGNOSIS — E039 Hypothyroidism, unspecified: Secondary | ICD-10-CM

## 2024-01-26 LAB — CBC (CANCER CENTER ONLY)
HCT: 31.8 % — ABNORMAL LOW (ref 36.0–46.0)
Hemoglobin: 9.6 g/dL — ABNORMAL LOW (ref 12.0–15.0)
MCH: 23.6 pg — ABNORMAL LOW (ref 26.0–34.0)
MCHC: 30.2 g/dL (ref 30.0–36.0)
MCV: 78.3 fL — ABNORMAL LOW (ref 80.0–100.0)
Platelet Count: 349 10*3/uL (ref 150–400)
RBC: 4.06 MIL/uL (ref 3.87–5.11)
RDW: 18.6 % — ABNORMAL HIGH (ref 11.5–15.5)
WBC Count: 7.4 10*3/uL (ref 4.0–10.5)
nRBC: 0.3 % — ABNORMAL HIGH (ref 0.0–0.2)

## 2024-01-26 LAB — VITAMIN B12: Vitamin B-12: 356 pg/mL (ref 180–914)

## 2024-01-26 LAB — HIV ANTIBODY (ROUTINE TESTING W REFLEX): HIV Screen 4th Generation wRfx: NONREACTIVE

## 2024-01-26 LAB — IRON AND TIBC
Iron: 27 ug/dL — ABNORMAL LOW (ref 28–170)
Saturation Ratios: 6 % — ABNORMAL LOW (ref 10.4–31.8)
TIBC: 475 ug/dL — ABNORMAL HIGH (ref 250–450)
UIBC: 448 ug/dL

## 2024-01-26 LAB — TSH: TSH: 6.75 u[IU]/mL — ABNORMAL HIGH (ref 0.450–4.500)

## 2024-01-26 LAB — LACTATE DEHYDROGENASE: LDH: 271 U/L — ABNORMAL HIGH (ref 98–192)

## 2024-01-26 LAB — FERRITIN: Ferritin: 7 ng/mL — ABNORMAL LOW (ref 11–307)

## 2024-01-26 LAB — FOLATE: Folate: 31 ng/mL (ref >5.9)

## 2024-01-26 NOTE — Progress Notes (Signed)
**   unable to LM, MB full
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
## 2024-01-27 LAB — HAPTOGLOBIN: Haptoglobin: 221 mg/dL (ref 42–296)

## 2024-02-01 ENCOUNTER — Inpatient Hospital Stay: Payer: MEDICAID

## 2024-02-01 VITALS — BP 98/83 | HR 83 | Temp 96.9°F | Resp 16

## 2024-02-01 DIAGNOSIS — D509 Iron deficiency anemia, unspecified: Secondary | ICD-10-CM | POA: Diagnosis not present

## 2024-02-01 MED ORDER — IRON SUCROSE 20 MG/ML IV SOLN
200.0000 mg | Freq: Once | INTRAVENOUS | Status: AC
  Administered 2024-02-01: 200 mg via INTRAVENOUS
  Filled 2024-02-01: qty 10

## 2024-02-03 ENCOUNTER — Inpatient Hospital Stay: Payer: MEDICAID

## 2024-02-04 ENCOUNTER — Inpatient Hospital Stay: Payer: MEDICAID

## 2024-02-04 VITALS — BP 118/76 | HR 84 | Temp 97.6°F | Resp 16

## 2024-02-04 DIAGNOSIS — D509 Iron deficiency anemia, unspecified: Secondary | ICD-10-CM | POA: Diagnosis not present

## 2024-02-04 MED ORDER — IRON SUCROSE 20 MG/ML IV SOLN
200.0000 mg | Freq: Once | INTRAVENOUS | Status: AC
  Administered 2024-02-04: 200 mg via INTRAVENOUS
  Filled 2024-02-04: qty 10

## 2024-02-04 NOTE — Patient Instructions (Signed)

## 2024-02-05 ENCOUNTER — Ambulatory Visit: Payer: MEDICAID

## 2024-02-08 ENCOUNTER — Inpatient Hospital Stay: Payer: MEDICAID | Attending: Oncology

## 2024-02-08 VITALS — BP 118/81 | HR 81 | Temp 98.4°F | Resp 18

## 2024-02-08 DIAGNOSIS — D509 Iron deficiency anemia, unspecified: Secondary | ICD-10-CM | POA: Diagnosis present

## 2024-02-08 DIAGNOSIS — Z79899 Other long term (current) drug therapy: Secondary | ICD-10-CM | POA: Insufficient documentation

## 2024-02-08 MED ORDER — IRON SUCROSE 20 MG/ML IV SOLN
200.0000 mg | Freq: Once | INTRAVENOUS | Status: AC
Start: 2024-02-08 — End: 2024-02-08
  Administered 2024-02-08: 200 mg via INTRAVENOUS

## 2024-02-08 NOTE — Patient Instructions (Signed)

## 2024-02-09 ENCOUNTER — Other Ambulatory Visit: Payer: Self-pay

## 2024-02-09 ENCOUNTER — Encounter: Payer: Self-pay | Admitting: Oncology

## 2024-02-09 ENCOUNTER — Emergency Department
Admission: EM | Admit: 2024-02-09 | Discharge: 2024-02-09 | Disposition: A | Discharge status: Home / Self Care | Payer: MEDICAID | Attending: Emergency Medicine | Admitting: Emergency Medicine

## 2024-02-09 DIAGNOSIS — K6289 Other specified diseases of anus and rectum: Secondary | ICD-10-CM | POA: Diagnosis present

## 2024-02-09 DIAGNOSIS — K644 Residual hemorrhoidal skin tags: Secondary | ICD-10-CM | POA: Diagnosis not present

## 2024-02-09 LAB — URINALYSIS, ROUTINE W REFLEX MICROSCOPIC
Bilirubin Urine: NEGATIVE
Glucose, UA: >=500 mg/dL — AB
Hgb urine dipstick: NEGATIVE
Ketones, ur: NEGATIVE mg/dL
Leukocytes,Ua: NEGATIVE
Nitrite: NEGATIVE
Protein, ur: NEGATIVE mg/dL
Specific Gravity, Urine: 1.01 (ref 1.005–1.030)
Squamous Epithelial / HPF: 0 /HPF (ref 0–5)
pH: 7 (ref 5.0–8.0)

## 2024-02-09 LAB — CBG MONITORING, ED: Glucose-Capillary: 111 mg/dL — ABNORMAL HIGH (ref 70–99)

## 2024-02-09 MED ORDER — HYDROCORTISONE ACETATE 25 MG RE SUPP: 25.0000 mg | Freq: Two times a day (BID) | RECTAL | 0 refills | Status: AC

## 2024-02-09 NOTE — ED Notes (Signed)
 Pt given orange juice, graham crackers, and peanut butter per request. Approved by SANE RN.

## 2024-02-09 NOTE — ED Notes (Signed)
 Pt's CBG assessed per her request
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
## 2024-02-09 NOTE — Discharge Instructions (Addendum)
 Your urine test did not show any sign of infection. Use Anusol as directed for the hemorrhoid symptoms.
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
## 2024-02-09 NOTE — ED Notes (Signed)
 SANE RN called and in route to ED for evaluation and case assessment.

## 2024-02-09 NOTE — ED Notes (Signed)
 [place] [employer] officer here to speak with pt

## 2024-02-10 ENCOUNTER — Inpatient Hospital Stay: Payer: MEDICAID

## 2024-02-10 VITALS — BP 114/69 | HR 86 | Temp 97.9°F | Resp 18

## 2024-02-10 DIAGNOSIS — D509 Iron deficiency anemia, unspecified: Secondary | ICD-10-CM | POA: Diagnosis not present

## 2024-02-10 MED ORDER — IRON SUCROSE 20 MG/ML IV SOLN
200.0000 mg | Freq: Once | INTRAVENOUS | Status: AC
  Administered 2024-02-10: 200 mg via INTRAVENOUS
  Filled 2024-02-10: qty 10

## 2024-02-11 ENCOUNTER — Ambulatory Visit: Payer: MEDICAID | Admitting: Cardiovascular Disease

## 2024-02-11 ENCOUNTER — Encounter: Payer: Self-pay | Admitting: Cardiovascular Disease

## 2024-02-11 VITALS — BP 113/72 | HR 90 | Ht 66.0 in | Wt 233.0 lb

## 2024-02-11 DIAGNOSIS — I10 Essential (primary) hypertension: Secondary | ICD-10-CM

## 2024-02-11 DIAGNOSIS — I5031 Acute diastolic (congestive) heart failure: Secondary | ICD-10-CM | POA: Diagnosis not present

## 2024-02-11 DIAGNOSIS — R7303 Prediabetes: Secondary | ICD-10-CM

## 2024-02-11 DIAGNOSIS — I340 Nonrheumatic mitral (valve) insufficiency: Secondary | ICD-10-CM | POA: Diagnosis not present

## 2024-02-11 DIAGNOSIS — R0602 Shortness of breath: Secondary | ICD-10-CM

## 2024-02-11 DIAGNOSIS — R0789 Other chest pain: Secondary | ICD-10-CM | POA: Diagnosis not present

## 2024-02-11 DIAGNOSIS — I5189 Other ill-defined heart diseases: Secondary | ICD-10-CM

## 2024-02-11 LAB — URINE CULTURE: Culture: 10000 — AB
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
## 2024-02-15 ENCOUNTER — Inpatient Hospital Stay: Payer: MEDICAID

## 2024-02-15 VITALS — BP 114/65 | HR 79 | Temp 97.0°F | Resp 19

## 2024-02-15 DIAGNOSIS — D509 Iron deficiency anemia, unspecified: Secondary | ICD-10-CM | POA: Diagnosis not present

## 2024-02-15 MED ORDER — IRON SUCROSE 20 MG/ML IV SOLN
200.0000 mg | Freq: Once | INTRAVENOUS | Status: AC
  Administered 2024-02-15: 200 mg via INTRAVENOUS
  Filled 2024-02-15: qty 10

## 2024-02-15 NOTE — Patient Instructions (Signed)

## 2024-02-19 ENCOUNTER — Other Ambulatory Visit: Payer: Self-pay | Admitting: Internal Medicine

## 2024-02-19 MED ORDER — LEVOTHYROXINE SODIUM 75 MCG PO TABS: 75.0000 ug | ORAL_TABLET | Freq: Every day | ORAL | 3 refills | Status: DC

## 2024-02-19 MED ORDER — LIOTHYRONINE SODIUM 25 MCG PO TABS: 25.0000 ug | ORAL_TABLET | Freq: Every day | ORAL | 3 refills | Status: DC

## 2024-02-22 ENCOUNTER — Other Ambulatory Visit: Payer: Self-pay

## 2024-02-22 MED ORDER — SLYND 4 MG PO TABS: 1.0000 | ORAL_TABLET | Freq: Every day | ORAL | 3 refills | Status: DC

## 2024-02-23 ENCOUNTER — Encounter: Payer: Self-pay | Admitting: Dermatology

## 2024-02-23 ENCOUNTER — Ambulatory Visit: Payer: MEDICAID | Admitting: Dermatology

## 2024-02-23 DIAGNOSIS — L209 Atopic dermatitis, unspecified: Secondary | ICD-10-CM | POA: Diagnosis not present

## 2024-02-23 DIAGNOSIS — D492 Neoplasm of unspecified behavior of bone, soft tissue, and skin: Secondary | ICD-10-CM

## 2024-02-23 DIAGNOSIS — Z79899 Other long term (current) drug therapy: Secondary | ICD-10-CM

## 2024-02-23 DIAGNOSIS — L2089 Other atopic dermatitis: Secondary | ICD-10-CM

## 2024-02-23 DIAGNOSIS — L929 Granulomatous disorder of the skin and subcutaneous tissue, unspecified: Secondary | ICD-10-CM | POA: Diagnosis not present

## 2024-02-23 DIAGNOSIS — K121 Other forms of stomatitis: Secondary | ICD-10-CM | POA: Diagnosis not present

## 2024-02-23 DIAGNOSIS — Z7189 Other specified counseling: Secondary | ICD-10-CM | POA: Diagnosis not present

## 2024-02-23 MED ORDER — MOMETASONE FUROATE 0.1 % EX CREA: 1.0000 | TOPICAL_CREAM | CUTANEOUS | 2 refills | Status: DC

## 2024-02-23 NOTE — Progress Notes (Signed)
   New Patient Visit   Subjective  Kristina Li is a 43 y.o. female who presents for the following: Rash hands, comes and goes >34yr, gets dark and dry, check spot L buccal mucosa, 2wks, tender The patient has spots, moles and lesions to be evaluated, some may be new or changing and the patient may have concern these could be cancer.  The following portions of the chart were reviewed this encounter and updated as appropriate: medications, allergies, medical history  Review of Systems:  No other skin or systemic complaints except as noted in HPI or Assessment and Plan.  Objective  Well appearing patient in no apparent distress; mood and affect are within normal limits.  A focused examination was performed of the following areas: Hands, mouth, right foot  Relevant exam findings are noted in the Assessment and Plan.  L lower lateral lip and  oral mucosa Fleshy pap 0.8          Assessment & Plan   ATOPIC DERMATITIS Hands; feet Exam Hands with peeling and xerosis, hyperlinear palms, peeling on R heel  Atopic dermatitis (eczema) is a chronic, relapsing, pruritic condition that can significantly affect quality of life. It is often associated with allergic rhinitis and/or asthma and can require treatment with topical medications, phototherapy, or in severe cases biologic injectable medication (Dupixent; Adbry) or Oral JAK inhibitors.   Treatment Plan Recommend mild soap and water rather then hand sanitizer Recommend moisturizer, like Cerave Cream, after washing hands Start Momtasone cr qd/bid up to 5d/wk aa hands until clear, then prn flares Discussed cotton gloves hs after applying Mometasone cr  May consider Aundria Mems on f/u Discussed True Patch Test 36 if needed in the future  Long term medication management.  Patient is using long term (months to years) prescription medication  to control their dermatologic condition.  These medications require  periodic monitoring to evaluate for efficacy and side effects and may require periodic laboratory monitoring.  NEOPLASM OF SKIN L lower lateral lip and  oral mucosa Skin excision  Lesion length (cm):  0.8 Lesion width (cm):  0.8 Margin per side (cm):  0.3 Total excision diameter (cm):  1.4 Informed consent: discussed and consent obtained   Timeout: patient name, date of birth, surgical site, and procedure verified   Procedure prep:  Patient was prepped and draped in usual sterile fashion Prep type:  Isopropyl alcohol and povidone-iodine Anesthesia: the lesion was anesthetized in a standard fashion   Anesthetic:  1% lidocaine w/ epinephrine 1-100,000 buffered w/ 8.4% NaHCO3 Instrument used comment:  Dermablade Hemostasis achieved with: pressure   Hemostasis achieved with comment:  Electrocautery Outcome: patient tolerated procedure well with no complications   Post-procedure details: sterile dressing applied and wound care instructions given   Dressing: Mupirocin.   Specimen 1 - Surgical pathology Differential Diagnosis: Bite Fibroma vs Other  Advised pt that Bite fibromas have potential for recurrence.  She understands.  She says she may have bitten lip when she had a seizure.  Check Margins: yes Fleshy pap 0.8cm OTHER ATOPIC DERMATITIS   MEDICATION MANAGEMENT   COUNSELING AND COORDINATION OF CARE    Return in about 7 months (around 09/24/2024).  I, Ardis Rowan, RMA, am acting as scribe for Armida Sans, MD .   Documentation: I have reviewed the above documentation for accuracy and completeness, and I agree with the above.  Armida Sans, MD

## 2024-02-23 NOTE — Patient Instructions (Addendum)

## 2024-02-24 ENCOUNTER — Other Ambulatory Visit: Payer: MEDICAID

## 2024-02-25 ENCOUNTER — Telehealth: Payer: Self-pay | Admitting: Internal Medicine

## 2024-02-25 ENCOUNTER — Other Ambulatory Visit: Payer: Self-pay

## 2024-02-25 ENCOUNTER — Other Ambulatory Visit: Payer: Self-pay | Admitting: Internal Medicine

## 2024-02-25 DIAGNOSIS — E039 Hypothyroidism, unspecified: Secondary | ICD-10-CM

## 2024-02-25 MED ORDER — LEVOTHYROXINE SODIUM 75 MCG PO TABS: 75.0000 ug | ORAL_TABLET | Freq: Every day | ORAL | 3 refills | Status: DC

## 2024-02-25 MED ORDER — LIOTHYRONINE SODIUM 25 MCG PO TABS: 25.0000 ug | ORAL_TABLET | Freq: Every day | ORAL | 3 refills | Status: DC

## 2024-02-25 NOTE — Telephone Encounter (Signed)
 Patient requesting a referral to endo for her thyroid. Please place referral to [facility].

## 2024-02-29 LAB — SURGICAL PATHOLOGY

## 2024-03-01 ENCOUNTER — Other Ambulatory Visit: Payer: MEDICAID

## 2024-03-01 ENCOUNTER — Telehealth: Payer: Self-pay

## 2024-03-01 DIAGNOSIS — E039 Hypothyroidism, unspecified: Secondary | ICD-10-CM

## 2024-03-01 NOTE — Telephone Encounter (Signed)
-----   Message from Armida Sans sent at 03/01/2024  9:19 AM EDT ----- FINAL DIAGNOSIS        1. Skin, L oral mucosa :       ULCER WITH INFLAMED GRANULATION TISSUE, SEE DESCRIPTION   Benign area of ulcer and healing tissue.  May be from trauma such as bite fibroma or pyogenic granuloma. May recur No further treatment needed at this time.

## 2024-03-01 NOTE — Telephone Encounter (Signed)
 Advised pt of bx results.  Pt would like for Dr. Gwen Pounds to remove the remainder of the lesion.  I advised I would ask Dr. Gwen Pounds and call her back./sh

## 2024-03-01 NOTE — Telephone Encounter (Signed)
 Per Dr. Gwen Pounds, advised pt that Dr. Gwen Pounds recommends waiting 2 months before further treatment.  Advised pt it can take up to 2 months for bx site to heal and improvements noticed.  Advised pt to send a MyChart message in 2 months to let us know if she desires any further treatment.  Advised at that point we could schedule her for f/u and Dr. Gwen Pounds would re-evaluate the area.Kristina Li

## 2024-03-03 ENCOUNTER — Other Ambulatory Visit: Payer: MEDICAID

## 2024-03-04 LAB — TSH: TSH: 0.906 u[IU]/mL (ref 0.450–4.500)

## 2024-03-04 NOTE — Progress Notes (Signed)
 Patient informed and mailed copy to the patient

## 2024-03-09 ENCOUNTER — Encounter: Payer: Self-pay | Admitting: Oncology

## 2024-03-10 ENCOUNTER — Ambulatory Visit: Payer: MEDICAID | Admitting: Cardiovascular Disease

## 2024-03-10 ENCOUNTER — Telehealth: Payer: Self-pay | Admitting: Internal Medicine

## 2024-03-10 NOTE — Telephone Encounter (Signed)
 Patient left VM requesting a referral to [facility]. Need to call patient and let her know that she needs an appt first before we can send a referral. Please call and schedule.

## 2024-03-14 ENCOUNTER — Encounter: Payer: Self-pay | Admitting: Podiatry

## 2024-03-14 ENCOUNTER — Ambulatory Visit (INDEPENDENT_AMBULATORY_CARE_PROVIDER_SITE_OTHER): Payer: MEDICAID

## 2024-03-14 ENCOUNTER — Ambulatory Visit: Payer: MEDICAID | Admitting: Podiatry

## 2024-03-14 DIAGNOSIS — M216X2 Other acquired deformities of left foot: Secondary | ICD-10-CM
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
## 2024-03-15 ENCOUNTER — Telehealth: Payer: Self-pay

## 2024-03-15 NOTE — Telephone Encounter (Signed)
 Patient LM wanting to know what stage her heart failure was in and she would also like [place] to mail her some information on the stage of the heart failure

## 2024-03-18 ENCOUNTER — Other Ambulatory Visit: Payer: Self-pay | Admitting: Internal Medicine

## 2024-03-20 ENCOUNTER — Emergency Department
Admission: EM | Admit: 2024-03-20 | Discharge: 2024-03-20 | Disposition: A | Discharge status: Home / Self Care | Payer: MEDICAID | Attending: Emergency Medicine | Admitting: Emergency Medicine

## 2024-03-20 ENCOUNTER — Emergency Department
Admission: EM | Admit: 2024-03-20 | Discharge: 2024-03-20 | Disposition: A | Discharge status: Home / Self Care | Payer: MEDICAID | Source: Home / Self Care | Attending: Emergency Medicine | Admitting: Emergency Medicine

## 2024-03-20 ENCOUNTER — Emergency Department: Payer: MEDICAID

## 2024-03-20 ENCOUNTER — Other Ambulatory Visit: Payer: Self-pay

## 2024-03-20 ENCOUNTER — Encounter: Payer: Self-pay | Admitting: Emergency Medicine

## 2024-03-20 DIAGNOSIS — R1900 Intra-abdominal and pelvic swelling, mass and lump, unspecified site: Secondary | ICD-10-CM | POA: Insufficient documentation

## 2024-03-20 DIAGNOSIS — M25511 Pain in right shoulder: Secondary | ICD-10-CM | POA: Diagnosis present

## 2024-03-20 DIAGNOSIS — M25562 Pain in left knee: Secondary | ICD-10-CM | POA: Insufficient documentation

## 2024-03-20 DIAGNOSIS — K644 Residual hemorrhoidal skin tags: Secondary | ICD-10-CM | POA: Diagnosis not present

## 2024-03-20 MED ORDER — KETOROLAC TROMETHAMINE 15 MG/ML IJ SOLN
15.0000 mg | Freq: Once | INTRAMUSCULAR | Status: AC
  Administered 2024-03-20: 15 mg via INTRAMUSCULAR
  Filled 2024-03-20: qty 1

## 2024-03-20 MED ORDER — VITAMIN D (ERGOCALCIFEROL) 1.25 MG (50000 UNIT) PO CAPS: 50000.0000 [IU] | ORAL_CAPSULE | ORAL | 2 refills | Status: DC

## 2024-03-20 NOTE — ED Notes (Signed)
 Pt seen walking in the lobby towards the exit, Primary RN made aware that the patient had eloped from the room.

## 2024-03-20 NOTE — Discharge Instructions (Signed)
 We understand you need need to leave today, and as we discussed, your vitals signs are normal and your exam is reassuring.  It is very unlikely you have an emergent medical condition, and it is appropriate for you to follow up as an outpatient with your regular doctor.  However, if your symptoms worsen or if you develop new symptoms that concern you, please return to the emergency department for additional evaluation.

## 2024-03-20 NOTE — Discharge Instructions (Addendum)
 Your testing today was overall reassuring.  Follow-up with your primary care doctor for further evaluation.  Have included information about a bowel regimen below.  Return to the ER for new or worsening symptoms.  To help prevent constipation in the future please: Eat a high-fiber diet Drink water and other fluids during the day Go to the bathroom at regular times every day  To treat constipation:  Take a capful of MiraLAX 1-2 times daily You can also try over-the-counter stool softener such as Dulcolax or milk of magnesia You can use over-the-counter medications such as senna, but only use this for a few days. Do not use this for a prolonged period.  If you are still having constipation, you can try an over-the-counter suppository or enema

## 2024-03-20 NOTE — ED Notes (Addendum)
 Reviewed D/C information with the patient, pt verbalized understanding. No additional concerns at this time.  Pt waiting in the lobby for a ride home.

## 2024-03-20 NOTE — ED Provider Notes (Signed)
 [facility] Provider Note    Event Date/Time   First MD Initiated Contact with Patient 03/20/24 [phone]     (approximate)   History   multiple medical complaints   HPI  Kristina Li is a 43 year old female presenting to the ER for evaluation after a fall. Reports she fell a few weeks ago and has ongoing pain over her right shoulder and left knee and would like x-rays of these.  Has been able to ambulate.  No numbness, tingling, focal weakness.  No head trauma, trauma to other areas.  No LOC.  Not anticoagulation.  Additionally tells me that she is concerned that she may have hemorrhoids.  Denies any rectal bleeding.  Reports history of lower abdominal pain in the past, currently denies this.       Physical Exam   Triage Vital Signs: ED Triage Vitals  Encounter Vitals Group     BP 03/20/24 0038 (!) 142/76     Systolic BP Percentile --      Diastolic BP Percentile --      Pulse Rate 03/20/24 0038 92     Resp 03/20/24 0038 18     Temp 03/20/24 0038 98.4 F (36.9 C)     Temp Source 03/20/24 0038 Oral     SpO2 03/20/24 0038 98 %     Weight 03/20/24 0039 243 lb (110.2 kg)     Height 03/20/24 0039 5\' 6"  (1.676 m)     Head Circumference --      Peak Flow --      Pain Score 03/20/24 0039 7     Pain Loc --      Pain Education --      Exclude from Growth Chart --     Most recent vital signs: Vitals:   03/20/24 0038  BP: (!) 142/76  Pulse: 92  Resp: 18  Temp: 98.4 F (36.9 C)  SpO2: 98%     General: Awake, interactive  CV:  Regular rate, good peripheral perfusion.  Resp:  Unlabored respirations, lungs clear to auscultation Abd:  Nondistended, soft, nontender to palpation, visible external hemorrhoids on exam, no active bleeding. Neuro:  Symmetric facial movement, fluid speech MSK:  Tenderness to palpation over the right shoulder and left knee, but able to fully range without significant discomfort   ED Results / Procedures /  Treatments   Labs (all labs ordered are listed, but only abnormal results are displayed) Labs Reviewed - No data to display   EKG EKG independently reviewed interpreted by myself (ER attending) demonstrates:    RADIOLOGY Imaging independently reviewed and interpreted by myself demonstrates:  Knee x-Tayelor Osborne without acute fracture Shoulder x-Bettyjo Lundblad without acute fracture  Formal Radiology Read:  DG Knee Complete 4 Views Left Result Date: 03/20/2024 CLINICAL DATA:  Left knee pain following fall, initial encounter EXAM: LEFT KNEE - COMPLETE 4+ VIEW COMPARISON:  None Available. FINDINGS: Tricompartmental degenerative changes are noted. No joint effusion is seen. No acute fracture or dislocation is noted. IMPRESSION: Degenerative change without acute abnormality. Electronically Signed   By: Violeta Grey M.D.   On: 03/20/2024 02:00   DG Shoulder Right Result Date: 03/20/2024 CLINICAL DATA:  Right shoulder pain following fall, initial encounter EXAM: RIGHT SHOULDER - 2+ VIEW COMPARISON:  07/20/2023 FINDINGS: There is been interval resorption of the distal end of the clavicle when compared with the prior exam. Humeral head is well seated. No acute fracture is noted. Underlying bony thorax appears within normal limits.  IMPRESSION: Interval resorption of the distal end of the right clavicle when compared with the prior exam. No acute abnormality is noted. Electronically Signed   By: Violeta Grey M.D.   On: 03/20/2024 01:59    PROCEDURES:  Critical Care performed: No  Procedures   MEDICATIONS ORDERED IN ED: Medications  ketorolac (TORADOL) 15 MG/ML injection 15 mg (15 mg Intramuscular Given 03/20/24 0323)     IMPRESSION / MDM / ASSESSMENT AND PLAN / ED COURSE  I reviewed the triage vital signs and the nursing notes.  Differential diagnosis includes, but is not limited to, lower suspicion fracture, dislocation, consideration for musculoskeletal strain, exam with external hemorrhoids, low suspicion  acute intra-abdominal process  Patient's presentation is most consistent with acute complicated illness / injury requiring diagnostic workup.  43 year old female presenting for evaluation after fall a few weeks ago.  Reassuring exam.  X-rays without acute bony injuries.  Does also have evidence of external hemorrhoids and exam.  Discussed bowel regimen and supportive care.  Does her primary care doctor she can follow-up with.  Strict return precautions provided.  Patient discharged in stable condition.     FINAL CLINICAL IMPRESSION(S) / ED DIAGNOSES   Final diagnoses:  Acute pain of right shoulder  Acute pain of left knee  External hemorrhoids without complication     Rx / DC Orders   ED Discharge Orders     None        Note:  This document was prepared using Dragon voice recognition software and may include unintentional dictation errors.   Claria Crofts, MD 03/20/24 [phone]

## 2024-03-20 NOTE — ED Provider Notes (Signed)
 [facility] Provider Note    Event Date/Time   First MD Initiated Contact with Patient 03/20/24 [phone]     (approximate)   History   No chief complaint on file.   HPI Kristina Li is a 43 y.o. female for whom this is her sixth ED visit in 6 months and the second in the last few hours.  She was seen a few hours ago by Dr. Synetta Eves for evaluation of some ongoing extremity pain after recent fall and with concerns for hemorrhoids. She had a reassuring evaluation and was discharged.  She has been sitting in the waiting room for a ride and she checked back in because she is concerned that her abdomen has been swelling and she might have some swelling in her legs as well.  She said that she thinks there might be fluid around her "female organs".  However, she was placed in a room and before I could see her she went back out to the waiting room and was seen by the triage nurse once again sitting and waiting for a ride.  I went out to the waiting room with her assigned nurse and talked with her.  She said that she cannot stay because her right is on the way.  She expressed her concerns about the abdominal swelling.  She has no fever, chest pain, shortness of breath, nausea, vomiting.  She acknowledged that she has a history of CHF and she has seen her regular doctor recently for a myocardial study and has an order to get an outpatient echocardiogram.  She says she has been taking her medications.     Physical Exam   Triage Vital Signs: ED Triage Vitals  Encounter Vitals Group     BP 03/20/24 0613 124/74     Systolic BP Percentile --      Diastolic BP Percentile --      Pulse Rate 03/20/24 0613 92     Resp 03/20/24 0613 16     Temp 03/20/24 0613 98 F (36.7 C)     Temp Source 03/20/24 0613 Oral     SpO2 03/20/24 0613 100 %     Weight 03/20/24 0611 110.2 kg (242 lb 15.9 oz)     Height 03/20/24 0611 1.676 m (5\' 6" )     Head Circumference --      Peak Flow --       Pain Score 03/20/24 0609 2     Pain Loc --      Pain Education --      Exclude from Growth Chart --     Most recent vital signs: Vitals:   03/20/24 0613  BP: 124/74  Pulse: 92  Resp: 16  Temp: 98 F (36.7 C)  SpO2: 100%    General: Awake, no distress.  CV:  Good peripheral perfusion.  Regular rate and rhythm. Resp:  Normal effort. Speaking easily and comfortably, no accessory muscle usage nor intercostal retractions.   Abd:  Obese but without obvious abdominal distention.  Abdomen is soft and nontender.  Not consistent with anasarca nor ascites. Other:  Possible mild trace peripheral edema bilaterally in lower legs, but minimal.   ED Results / Procedures / Treatments   Labs (all labs ordered are listed, but only abnormal results are displayed) Labs Reviewed - No data to display   PROCEDURES:  Critical Care performed: No  Procedures    IMPRESSION / MDM / ASSESSMENT AND PLAN / ED COURSE  I  reviewed the triage vital signs and the nursing notes.                              Differential diagnosis includes, but is not limited to, anxiety over possible medical condition, malingering, CHF exacerbation, electrolyte or metabolic abnormality, renal dysfunction, dietary side effect, medication or drug side effect.  Patient's presentation is most consistent with acute, uncomplicated illness.    The patient left her exam room and lay back to the waiting room before I can see her but when I was alerted that she was on the waiting room, her nurse I went out to talk with her.  She did not want to come back to an exam room for additional assessment.  I explained that her vital signs are normal and her physical exam is reassuring and it is unlikely she has an emergent medical condition, but she is welcome to come back to her room for additional assessment.  Alternatively, given no sign of an emergent condition, she can follow-up as an outpatient.  I pointed out that she has an order  for an outpatient echocardiogram, and when people experience swelling and have a history of CHF, it is often that they are retaining fluid and she would benefit from outpatient follow-up.  She acknowledged this and states she will do so.  She also says she may come back later today "for an ultrasound".  I pointed out to her that that will be dependent upon the provider that she sees if she comes back later, if they feel it is medically indicated to do lab work, an ultrasound, etc.  She says she understands.  I gave my usual customary return precautions.         FINAL CLINICAL IMPRESSION(S) / ED DIAGNOSES   Final diagnoses:  Abdominal swelling     Rx / DC Orders   ED Discharge Orders     None        Note:  This document was prepared using Dragon voice recognition software and may include unintentional dictation errors.   Lynnda Sas, MD 03/20/24 [phone]

## 2024-03-20 NOTE — ED Notes (Addendum)
 While pt in ED lobby post discharge waiting for a ride, Pt yelled out for a nurse, walked over to the pt and offered assistance. PT states her stomach is swollen more than it was when she first arrived here via EMS. Pt was just discharged from ED. Advised pt she will need to go back to registration and check back in if she wishes to be seen again. Pt does not appear to be in any distress.

## 2024-03-20 NOTE — ED Triage Notes (Signed)
 Pt presents with complaints of abdominal swelling. She was D/C a few hours ago and states that while waiting here in the lobby she has had some increased swelling her abdomen and would like to be checked. She also states that she received IM pain meds without any improvement in her sx she came for initially with EMS. A&Ox4 at this time. Denies CP or SOB.

## 2024-03-20 NOTE — ED Triage Notes (Signed)
 Pt arrives via EMS from home for medical complaints - pt is complaining of pain in bilateral knees, right shoulder, lower abdomen and pelvic area 7/10. Pt a/ox4, NAD, MAE, breathing even and unlabored.

## 2024-03-21 ENCOUNTER — Ambulatory Visit: Payer: MEDICAID | Admitting: Podiatry

## 2024-03-21 ENCOUNTER — Encounter: Payer: Self-pay | Admitting: Podiatry

## 2024-03-21 DIAGNOSIS — M25372 Other instability, left ankle: Secondary | ICD-10-CM | POA: Diagnosis not present

## 2024-03-21 DIAGNOSIS — M216X2 Other acquired deformities of left foot: Secondary | ICD-10-CM | POA: Diagnosis not present

## 2024-03-21 NOTE — Progress Notes (Signed)
  Subjective:  Patient ID: Kristina Li, female    DOB: November 04, 1981,  MRN: [id number]  Chief Complaint  Patient presents with   Foot Pain    "I am here to speak to the doctor about surgery on my foot."    Discussed the use of AI scribe software for clinical note transcription with the patient, who gave verbal consent to proceed.  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  She describes walking 'a little bit funny and off balance,' which is concerning given her residence on the third floor. Her foot is 'going in that way,' raising worries about the risk of falling and potential injury. She has not had significant problems with her congenital leg deformities until now. No pain in her feet is reported, but they feel cold.  She has a history of diabetes, which she attributes to a thyroid condition, and her A1c is reportedly 'pretty good.' She typically wears good tennis shoes and various sandals but has not used orthotics or inserts. She has experienced balance and falling issues, which she believes may be affecting her knee.  No pain when pressing down or pulling up on her foot and no ankle instability. She acknowledges having flat feet but is concerned about her foot rolling inwards.      Objective:    Physical Exam VASCULAR: DP and PT pulse palpable. Foot is warm and well-perfused. Capillary fill time is brisk. DERMATOLOGIC: Normal skin turgor texture and temperature. No open lesions or rashes or ulcerations. NEUROLOGIC: Normal sensation to light touch and pressure. No paresthesias on examination. ORTHOPEDIC: Smooth pain-free range of motion of all examined joints. No ecchymosis or bruising. No gross deformity. No pain to palpation. 5/5 strength in all planes. Collapsed medial longitudinal arch and valgus heel positioning on weight bearing.   No  images are attached to the encounter.    Results RADIOLOGY Radiographs of the left foot: Mild predominantly transverse plane deformity (03/14/2024)   Assessment:   1. Ankle instability, left   2. Pronation deformity of left foot      Plan:  Patient was evaluated and treated and all questions answered.  Assessment and Plan Assessment & Plan Flatfoot (Pes Planus) Presents with flatfoot, characterized by a collapsed medial longitudinal arch and valgus heel positioning. Reports balance issues and concerns about falling, but denies pain. Radiographs show a mild transverse plane deformity. Surgical intervention is not recommended due to the absence of pain. Conservative management is prioritized to prevent complications and improve stability. - Recommend wearing supportive shoes with good arch support, such as Vaughn Georges, Patterson, or Ultras. - Advise the use of orthotic inserts, such as Power Step, Super Feet, or Protailus, to provide additional support. - Refer to physical therapy at [facility] at [facility] for strengthening and stability exercises. - Consider lace-up ankle braces if additional support is needed. - Schedule follow-up if pain or swelling develops, with consideration for MRI if symptoms worsen.  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      No follow-ups on file.

## 2024-03-21 NOTE — Patient Instructions (Addendum)
 VISIT SUMMARY:  Today, you were seen for balance issues and concerns about foot stability related to your congenital leg deformities. You reported feeling off balance and worried about the risk of falling, especially given your residence on the third floor. You also mentioned having flat feet and experiencing cold feet, but no pain. Your diabetes, which you attribute to a thyroid condition, is well-controlled with a good A1c level.  YOUR PLAN:  -FLATFOOT (PES PLANUS): Flatfoot, or pes planus, means that the arch on the inside of your foot is flattened, allowing the entire sole of your foot to touch the ground. To help with your balance and stability, it is recommended that you wear supportive shoes with good arch support, such as Vaughn Georges, [place], or Altras. Additionally, using orthotic inserts like Power Step, Super Feet, or Protalus can provide extra support. You are also referred to physical therapy at [facility] at [facility] for exercises to strengthen and stabilize your feet. If you need more support, consider using lace-up ankle braces. Please schedule a follow-up if you experience any pain or swelling, and an MRI may be considered if your symptoms worsen.  -DIABETES MELLITUS: Diabetes mellitus is a condition where your blood sugar levels are higher than normal. Your diabetes is well-controlled with a good A1c level, which is a measure of your average blood sugar over the past 2-3 months.  INSTRUCTIONS:  Please follow up with physical therapy at [facility] at [facility] for strengthening and stability exercises. Schedule a follow-up appointment if you experience any pain or swelling in your feet. If your symptoms worsen, an MRI may be considered.     Chronic Ankle Instability & Rehab Chronic Ankle Instability Chronic ankle instability is a condition that makes the ankle weak and more likely to give way. The condition is common among athletes, especially  those with prior ankle ligament injury. Ligaments are strong tissues that connectbones to each other. What are the causes?  This condition is caused by multiple ankle sprains that have not healedproperly, leaving the ankle ligaments loose or damaged. What increases the risk? This condition is more likely to develop in people who participate in sports in which there is a risk of spraining an ankle. These sports include: Cross-country trail running. Basketball. Baseball. Tennis. Football. Soccer. What are the signs or symptoms? Symptoms of this condition include: Rolling your ankle repeatedly. Swelling. Pain. Bruising. Tenderness. Feeling wobbly or unsteady on your foot. Difficulty walking on uneven surfaces or in the dark. How is this diagnosed? This condition may be diagnosed based on: Your symptoms. Your medical history. A physical exam. Your health care provider will check your balance, strength, and range of motion. He or she will also check your injured ankle against your healthy ankle. Imaging tests, such as: An X-ray. A CT scan. An MRI. An ultrasound. How is this treated? Treatment for this condition may include: Wearing a removable boot, brace, or splint. Wearing supportive shoes or shoe inserts. Applying ice to the ankle to reduce swelling. Taking anti-inflammatory pain medicine. Doing exercises (physical therapy). Not putting any body weight, or putting only limited body weight, on your ankle for several days. Gradually returning to full activity. Surgery to repair damaged ligaments. Usually, surgery is needed only if the condition is severe or if othertreatments do not work. Follow these instructions at home: If you have a boot, brace, or splint: Wear it as told by your health care provider. Remove it only as told by your health  care provider. Loosen it if your toes tingle, become numb, or turn cold and blue. Keep it clean. If it is not waterproof: Do not let  it get wet. Cover it with a watertight covering when you take a bath or a shower. Ask your health care provider when it is safe to drive with a boot, brace, or splint on your foot. Managing pain, stiffness, and swelling  If directed, put ice on the injured area. If you have a removable boot, brace, or splint, remove it as told by your health care provider. Put ice in a plastic bag. Place a towel between your skin and the bag. Leave the ice on for 20 minutes, 2-3 times a day. Move your toes, foot, and ankle often to reduce stiffness and swelling. Raise (elevate) the injured area above the level of your heart while you are sitting or lying down.  Activity Return to your normal activities as told by your health care provider. Ask your health care provider what activities are safe for you. Do not put your full body weight on your ankle until your health care provider says that you can. Do not do any activities that make pain or swelling worse. Do exercises as told by your health care provider. General instructions Take over-the-counter and prescription medicines only as told by your health care provider. Wear supportive shoes or inserts as told by your health care provider. Keep all follow-up visits as told by your health care provider. This is important. How is this prevented? Wear supportive footwear that is appropriate for your athletic activity. Avoid athletic activities that cause pain or swelling in your ankle. See your health care provider if you have an ankle sprain that causes pain and swelling for more than 2-4 weeks. Do ankle range-of-motion and strengthening exercises as told by your health care provider before beginning any athletic activity. If you start a new athletic activity, start gradually to build up your strength and flexibility. Contact a health care provider if: Your condition is not getting better after 2-4 weeks of treatment. You cannot put body weight on your ankle  without feeling more pain. Summary Chronic ankle instability is a condition that makes the ankle weak and more likely to give way. This condition is caused by multiple ankle sprains that have not healed properly, leaving the ankle ligaments loose or damaged. Treatment includes wearing a boot, brace, or splint, taking medicines for pain and inflammation, and using ice on the affected area. Follow your health care provider's instructions for caring for your ankle during recovery. Contact your health care provider if your ankle does not get better in 2-4 weeks, or if you cannot put weight on your ankle without feeling more pain. This information is not intended to replace advice given to you by your health care provider. Make sure you discuss any questions you have with your healthcare provider. Document Revised: 09/07/2018 Document Reviewed: 09/07/2018 Elsevier Patient Education  2022 [employer]    Ask your health care provider which exercises are safe for you. Do exercises exactly as told by your health care provider and adjust them as directed. It is normal to feel mild stretching, pulling, tightness, or discomfort as you do these exercises. Stop right away if you feel sudden pain or your pain gets worse. Do not begin these exercises until told by your health care provider. Strengthening exercises These exercises build strength and endurance in your ankle. Endurance is theability to use your muscles for a long time,  even after they get tired. Ankle eversion Sit on the floor with your legs straight out in front of you. Loop a rubber exercise band around the ball of your left / right foot. The ball of your foot is on the walking surface, right under your toes. Hold the ends of the band in your hands, or secure the band to a stable object. Slowly push your foot outward, away from your other leg (eversion). Hold this position for 10 seconds. Slowly return your foot to the starting  position. Repeat 10 times. Complete this exercise 2 times a day. Heel walking  This exercise strengthens the muscles that push the ankle backward to point your toes toward your knee (ankle dorsiflexors). Walk on your heels for 10 ft. Keep your toes as high as possible. Repeat 10 times. Complete this exercise 2  times per day. Toe walking  This exercise strengthens the muscles that push the ankle forward and your toes downward (ankle plantar flexors). Walk on your toes for 10 ft. Keep your heels as high as possible. Repeat 10  times. Complete this exercise 2  times per day. Balance exercises These exercises improve or maintain your balance. Balance is important inimproving ankle stability and preventing falls. Tandem walking Do this exercise in a hallway or room that is at least 10 ft (3 m) long. Stand with one foot directly in front of the other (tandem). You can use the walls to help you balance if needed, but try not to use them for support. Slowly lift your back foot and place it directly in front of your other foot. Continue to walk in this heel-to-toe way for 10 ft. Repeat 10  times. Complete this exercise 2  times a day. Single leg stand Without shoes, stand near a railing or in a doorway. You may hold on to the railing or door frame as needed for balance. Stand on your left / right foot. Keep your big toe down on the floor and try to keep your arch lifted. If this is too easy, you can try doing it while you do one of these actions: Keep your eyes closed. Stand on a pillow. Throw a ball against a wall and catch it when it returns. Hold this position for 10 seconds. Repeat 10 times. Complete this exercise 2 times a day. Ankle inversion and ankle eversion This exercise is also called foot rotation with a balance board. It uses a balance board to rotate the foot and ankle inward (inversion) and outward (eversion). Ask your health care provider where you can get a balance board or how  you can make one. Stand on a non-carpeted surface near a countertop or wall. Step onto the balance board so your feet are hip width apart. Keep your feet in place, and keep your upper body and hips steady. Using only your feet and ankles to move the board, do the following exercises: Tip the board from side to side as far as you can, alternating between tipping to the left and tipping to the right. Tip the board so it silently taps the floor. Do not let the board forcefully hit the floor. From time to time, pause to hold a steady midway position, with neither the right nor the left sides touching the ground. Tip the board side to side so the board does not hit the floor at all. From time to time, pause to hold a steady midway position. Repeat 10 times, pausing from time to time to hold  a steady position.Complete this exercise 2 times a day. Ankle plantar flexion and ankle dorsiflexion This exercise is also called foot flexion with a balance board. It uses a balance board to push the foot downward and away from the leg (plantar flexion) or upward and toward the leg (dorsiflexion). Ask your health care provider where you can get a balance board or how you can make one. Stand on a non-carpeted surface near a countertop or wall. Step onto the balance board so your feet are hip width apart. Keep your feet in place, and keep your upper body and hips steady. Using only your feet and ankles, do the following exercises: Tip the board forward and backward so the board silently taps the floor. Do not let the board forcefully hit the floor. From time to time, pause to hold a steady position midway between touching the floor in front and touching the floor in back. Tip the board forward and backward so the board does not hit the floor at all. From time to time, pause to hold a steady position. Repeat 10 times, pausing from time to time to hold a steady position.Complete this exercise 2 times a day. This  information is not intended to replace advice given to you by your health care provider. Make sure you discuss any questions you have with your healthcare provider. Document Revised: 03/17/2019 Document Reviewed: 09/07/2018 Elsevier Patient Education  2022 [employer].

## 2024-03-28 ENCOUNTER — Ambulatory Visit (INDEPENDENT_AMBULATORY_CARE_PROVIDER_SITE_OTHER): Payer: MEDICAID | Admitting: Cardiovascular Disease

## 2024-03-28 ENCOUNTER — Encounter: Payer: Self-pay | Admitting: Cardiovascular Disease

## 2024-03-28 VITALS — BP 130/70 | HR 97 | Ht 66.0 in | Wt 234.4 lb

## 2024-03-28 DIAGNOSIS — I5189 Other ill-defined heart diseases: Secondary | ICD-10-CM

## 2024-03-28 DIAGNOSIS — R0789 Other chest pain: Secondary | ICD-10-CM | POA: Diagnosis not present

## 2024-03-28 DIAGNOSIS — I10 Essential (primary) hypertension: Secondary | ICD-10-CM

## 2024-03-28 DIAGNOSIS — I5031 Acute diastolic (congestive) heart failure: Secondary | ICD-10-CM

## 2024-03-28 DIAGNOSIS — R0602 Shortness of breath: Secondary | ICD-10-CM

## 2024-03-28 DIAGNOSIS — I340 Nonrheumatic mitral (valve) insufficiency: Secondary | ICD-10-CM
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
## 2024-03-29 ENCOUNTER — Emergency Department: Payer: MEDICAID

## 2024-03-29 ENCOUNTER — Other Ambulatory Visit: Payer: Self-pay

## 2024-03-29 ENCOUNTER — Emergency Department
Admission: EM | Admit: 2024-03-29 | Discharge: 2024-03-29 | Disposition: A | Discharge status: Home / Self Care | Payer: MEDICAID | Attending: Emergency Medicine | Admitting: Emergency Medicine

## 2024-03-29 DIAGNOSIS — W19XXXA Unspecified fall, initial encounter: Secondary | ICD-10-CM | POA: Insufficient documentation

## 2024-03-29 DIAGNOSIS — R569 Unspecified convulsions: Secondary | ICD-10-CM | POA: Diagnosis not present

## 2024-03-29 DIAGNOSIS — M25511 Pain in right shoulder: Secondary | ICD-10-CM | POA: Insufficient documentation

## 2024-03-29 DIAGNOSIS — S00512A Abrasion of oral cavity, initial encounter: Secondary | ICD-10-CM | POA: Diagnosis present

## 2024-03-29 LAB — COMPREHENSIVE METABOLIC PANEL WITH GFR
ALT: 37 U/L (ref 0–44)
AST: 29 U/L (ref 15–41)
Albumin: 4 g/dL (ref 3.5–5.0)
Alkaline Phosphatase: 67 U/L (ref 38–126)
Anion gap: 10 (ref 5–15)
BUN: 8 mg/dL (ref 6–20)
CO2: 24 mmol/L (ref 22–32)
Calcium: 9.1 mg/dL (ref 8.9–10.3)
Chloride: 104 mmol/L (ref 98–111)
Creatinine, Ser: 0.94 mg/dL (ref 0.44–1.00)
GFR, Estimated: >60 mL/min (ref >60)
Glucose, Bld: 97 mg/dL (ref 70–99)
Potassium: 4 mmol/L (ref 3.5–5.1)
Sodium: 138 mmol/L (ref 135–145)
Total Bilirubin: 0.4 mg/dL (ref 0.0–1.2)
Total Protein: 6.6 g/dL (ref 6.5–8.1)

## 2024-03-29 LAB — POC URINE PREG, ED: Preg Test, Ur: NEGATIVE

## 2024-03-29 LAB — CBC WITH DIFFERENTIAL/PLATELET
Abs Immature Granulocytes: 0.03 10*3/uL (ref 0.00–0.07)
Basophils Absolute: 0 10*3/uL (ref 0.0–0.1)
Basophils Relative: 0 %
Eosinophils Absolute: 0.1 10*3/uL (ref 0.0–0.5)
Eosinophils Relative: 1 %
HCT: 37.5 % (ref 36.0–46.0)
Hemoglobin: 11.9 g/dL — ABNORMAL LOW (ref 12.0–15.0)
Immature Granulocytes: 0 %
Lymphocytes Relative: 10 %
Lymphs Abs: 0.9 10*3/uL (ref 0.7–4.0)
MCH: 26.7 pg (ref 26.0–34.0)
MCHC: 31.7 g/dL (ref 30.0–36.0)
MCV: 84.1 fL (ref 80.0–100.0)
Monocytes Absolute: 0.7 10*3/uL (ref 0.1–1.0)
Monocytes Relative: 7 %
Neutro Abs: 7.8 10*3/uL — ABNORMAL HIGH (ref 1.7–7.7)
Neutrophils Relative %: 82 %
Platelets: 232 10*3/uL (ref 150–400)
RBC: 4.46 MIL/uL (ref 3.87–5.11)
RDW: 22.5 % — ABNORMAL HIGH (ref 11.5–15.5)
Smear Review: NORMAL
WBC: 9.5 10*3/uL (ref 4.0–10.5)
nRBC: 0 % (ref 0.0–0.2)

## 2024-03-29 LAB — CBG MONITORING, ED
Glucose-Capillary: 70 mg/dL (ref 70–99)
Glucose-Capillary: 86 mg/dL (ref 70–99)

## 2024-03-29 LAB — TROPONIN I (HIGH SENSITIVITY)
Troponin I (High Sensitivity): 10 ng/L (ref <18)
Troponin I (High Sensitivity): 16 ng/L (ref <18)

## 2024-03-29 LAB — CK: Total CK: 103 U/L (ref 38–234)

## 2024-03-29 LAB — HCG, QUANTITATIVE, PREGNANCY: hCG, Beta Chain, Quant, S: <1 m[IU]/mL (ref <5)

## 2024-03-29 LAB — MAGNESIUM: Magnesium: 2.4 mg/dL (ref 1.7–2.4)

## 2024-03-29 MED ORDER — OXYCODONE HCL 5 MG PO TABS
5.0000 mg | ORAL_TABLET | Freq: Once | ORAL | Status: AC
  Administered 2024-03-29: 5 mg via ORAL
  Filled 2024-03-29: qty 1

## 2024-03-29 MED ORDER — MAGIC MOUTHWASH
10.0000 mL | Freq: Once | ORAL | Status: AC
  Administered 2024-03-29: 10 mL via ORAL
  Filled 2024-03-29: qty 10

## 2024-03-29 MED ORDER — SODIUM CHLORIDE 0.9 % IV BOLUS
1000.0000 mL | Freq: Once | INTRAVENOUS | Status: AC
  Administered 2024-03-29: 1000 mL via INTRAVENOUS

## 2024-03-29 MED ORDER — LIDOCAINE 5 % EX PTCH: 1.0000 | MEDICATED_PATCH | CUTANEOUS | 0 refills | Status: AC

## 2024-03-29 MED ORDER — LIDOCAINE 5 % EX PTCH
1.0000 | MEDICATED_PATCH | Freq: Once | CUTANEOUS | Status: DC
  Administered 2024-03-29: 1 via TRANSDERMAL
  Filled 2024-03-29: qty 1

## 2024-03-29 MED ORDER — ACETAMINOPHEN 500 MG PO TABS
1000.0000 mg | ORAL_TABLET | Freq: Once | ORAL | Status: AC
  Administered 2024-03-29: 1000 mg via ORAL
  Filled 2024-03-29: qty 2

## 2024-03-29 NOTE — ED Triage Notes (Signed)
 Pt arrived via [facility] from home with c/o possible unwitnessed seizure. Pt has a Hx of seizures and takes Lamictal  daily. Pt states that the last thing they remember was eating a sweet potato after separating some laundry and when she woke up she was on the floor in the living room. Pt was able to make it to their bed and call EMS. Pt is A&Ox4 but slighty postical during triage.

## 2024-03-29 NOTE — ED Notes (Signed)
Pt given orange juice and crackers with peanut butter

## 2024-03-29 NOTE — Discharge Instructions (Addendum)
 Please call your orthopedic doctor to make a follow-up appointment for your right shoulder and please call your neurologist to make a follow-up appointment for the seizure activity.  You can take Tylenol  1 g every 8 hours with the pain patch and return to the ER if you develop worsening symptoms, fevers or any other concerns

## 2024-03-29 NOTE — ED Provider Notes (Signed)
 [facility] Provider Note    Event Date/Time   First MD Initiated Contact with Patient 03/29/24 1627     (approximate)   History   possible seizure   HPI  Kristina Li is a 43 y.o. female with seizure history who comes in with concerns for possible seizure.  Patient was found down on the ground by family.  Unclear how long she had been down on the ground for.  They were concerned about a seizure given she seemed confused and had bit her tongue.  She does report intermittent seizures but has been on Lamictal  125 twice daily.  Denies any missed medications, alcohol, drug use.  Patient is unclear what happened but does report some right shoulder pain.  Patient is on Xarelto  and does report some headache.  I reviewed the notes were patient was seen on 11/14/2019 for where patient's had prior MRI, EEGs have been reassuring and being followed by Dr. Walden Guise from neurology   Physical Exam   Triage Vital Signs: ED Triage Vitals [03/29/24 1628]  Encounter Vitals Group     BP 118/76     Systolic BP Percentile      Diastolic BP Percentile      Pulse Rate (!) 106     Resp 18     Temp 98.4 F (36.9 C)     Temp Source Oral     SpO2 99 %     Weight      Height      Head Circumference      Peak Flow      Pain Score      Pain Loc      Pain Education      Exclude from Growth Chart     Most recent vital signs: Vitals:   03/29/24 1628  BP: 118/76  Pulse: (!) 106  Resp: 18  Temp: 98.4 F (36.9 C)  SpO2: 99%     General: Awake, no distress.  CV:  Good peripheral perfusion.  Resp:  Normal effort.  Abd:  No distention.  Other:  Small bite wound noted on the right side of her tongue with no active bleeding.  No obvious hematoma to the head no obvious CTL spine tenderness.  Some mild right shoulder tenderness.  No chest wall pain, no abdominal pain.  Able to lift up both legs.  Cranial nerves appear intact although patient somewhat sleepy Good  distal pulse in the right arm.  No obvious deformity noted to the shoulder.  No redness, no warmth.  ED Results / Procedures / Treatments   Labs (all labs ordered are listed, but only abnormal results are displayed) Labs Reviewed  COMPREHENSIVE METABOLIC PANEL WITH GFR  CBC WITH DIFFERENTIAL/PLATELET  CK  MAGNESIUM   LAMOTRIGINE  LEVEL  CBG MONITORING, ED  POC URINE PREG, ED  TROPONIN I (HIGH SENSITIVITY)     EKG  My interpretation of EKG:  Sinus tachycardia rate of 108 without any ST elevation or T wave inversions, normal intervals  RADIOLOGY I have reviewed the CT personally and interpreted and no ICH    PROCEDURES:  Critical Care performed: No  .1-3 Lead EKG Interpretation  Performed by: Lubertha Rush, MD Authorized by: Lubertha Rush, MD     Interpretation: normal     ECG rate:  90   ECG rate assessment: normal     Rhythm: sinus rhythm     Ectopy: none     Conduction: normal  MEDICATIONS ORDERED IN ED: Medications  lidocaine  (LIDODERM ) 5 % 1 patch (1 patch Transdermal Patch Applied 03/29/24 1942)  sodium chloride  0.9 % bolus 1,000 mL (1,000 mLs Intravenous New Bag/Given 03/29/24 1724)  acetaminophen  (TYLENOL ) tablet 1,000 mg (1,000 mg Oral Given 03/29/24 1809)  magic mouthwash (10 mLs Oral Given 03/29/24 1810)  oxyCODONE  (Oxy IR/ROXICODONE ) immediate release tablet 5 mg (5 mg Oral Given 03/29/24 2029)     IMPRESSION / MDM / ASSESSMENT AND PLAN / ED COURSE  I reviewed the triage vital signs and the nursing notes.   Patient's presentation is most consistent with acute presentation with potential threat to life or bodily function.   Patient comes in with possible seizure.  Workup was done to evaluate for ACS, Electra abnormalities.  CT imaging ordered evaluate for intercranial hemorrhage, cervical fracture x-ray ordered evaluate for any shoulder fracture  Pregnancy test was negative.  Glucose slightly low patient getting some food.  hCG was negative CBC  reassuring CMP reassuring magnesium  normal troponin negative  7:40 PM reevaluated patient and updated on results.  Patient's mother and father at bedside.  Patient does live by herself.  They have someone who can stay with her over the next 24 to 48 hours to monitor make sure no additional seizures however at this time she feels that her normal self just feels sore all over.  She reports a little bit of back pain but it is more with just movement.  On palpation she is got no CTL spine tenderness therefore I doubt any fracture that would require imaging.  Will place lidocaine  patch.  8:30 PM patient now much more awake.  Repeat troponin negative.  Patient's put on the monitor without any cardiac issues no additional seizures.  Patient requesting some pain medication as well as a prescription to go home with.  Requesting some tramadol  or hydrocodone .  Since 2024 patient's had 7 different prescriptions for opioids.  We discussed the harms of opioids and that tramadol  can lower seizure threshold and that they can cause additional sedation and given that we do not know exactly what happened today that they could be dangerous.  Patient's upset stating that she needs something to help treat her pain.  We discussed Tylenol , lidocaine  patches, immobilization of the right shoulder given x-ray was negative we discussed however trying to not allow her to develop a frozen shoulder.  Mom is at bedside who is agreeable to holding off on opioids and patient states that she does not want to use a sling.  Patient reportedly had rotator cuff surgery on the right shoulder and was having shoulder pain prior to the seizure activity today and that it just seems to be worsened.  Mom is agreeable to holding off on narcotic prescription.  We discussed other ways to manage pain but patient states that she just wants to go home given I am not going to give her a prescription.  Her workup is otherwise reassuring she is not given a urine but  denies any urinary symptoms and is requesting to be discharged home.  Patient has been here for over 4 hours without recurrent symptoms/seizure actrivity   The patient is on the cardiac monitor to evaluate for evidence of arrhythmia and/or significant heart rate changes.      FINAL CLINICAL IMPRESSION(S) / ED DIAGNOSES   Final diagnoses:  Seizure (HCC)  Abrasion of tongue, initial encounter  Acute pain of right shoulder     Rx / DC Orders   ED  Discharge Orders          Ordered    lidocaine  (LIDODERM ) 5 %  Every 24 hours        03/29/24 2033             Note:  This document was prepared using Dragon voice recognition software and may include unintentional dictation errors.   Lubertha Rush, MD 03/29/24 2034

## 2024-03-31 LAB — LAMOTRIGINE LEVEL: Lamotrigine Lvl: 4.6 ug/mL (ref 2.0–20.0)

## 2024-04-11 ENCOUNTER — Ambulatory Visit: Payer: MEDICAID

## 2024-04-13 ENCOUNTER — Other Ambulatory Visit: Payer: Self-pay | Admitting: Cardiovascular Disease

## 2024-04-15 ENCOUNTER — Other Ambulatory Visit: Payer: Self-pay

## 2024-04-15 MED ORDER — SPIRONOLACTONE 25 MG PO TABS: 25.0000 mg | ORAL_TABLET | Freq: Every day | ORAL | 0 refills | Status: DC

## 2024-04-22 ENCOUNTER — Ambulatory Visit: Payer: MEDICAID

## 2024-04-22 DIAGNOSIS — I361 Nonrheumatic tricuspid (valve) insufficiency: Secondary | ICD-10-CM | POA: Diagnosis not present

## 2024-04-22 DIAGNOSIS — I5031 Acute diastolic (congestive) heart failure: Secondary | ICD-10-CM

## 2024-04-22 DIAGNOSIS — R0602 Shortness of breath: Secondary | ICD-10-CM

## 2024-04-22 DIAGNOSIS — I10 Essential (primary) hypertension: Secondary | ICD-10-CM

## 2024-04-22 DIAGNOSIS — I340 Nonrheumatic mitral (valve) insufficiency: Secondary | ICD-10-CM

## 2024-04-22 DIAGNOSIS — R0789 Other chest pain: Secondary | ICD-10-CM

## 2024-04-22 DIAGNOSIS — I5189 Other ill-defined heart diseases: Secondary | ICD-10-CM

## 2024-04-25 ENCOUNTER — Other Ambulatory Visit: Payer: Self-pay

## 2024-04-26 ENCOUNTER — Ambulatory Visit: Payer: MEDICAID | Admitting: Cardiovascular Disease

## 2024-04-28 ENCOUNTER — Ambulatory Visit: Payer: MEDICAID

## 2024-04-28 DIAGNOSIS — R0789 Other chest pain: Secondary | ICD-10-CM | POA: Diagnosis not present

## 2024-04-28 DIAGNOSIS — I340 Nonrheumatic mitral (valve) insufficiency: Secondary | ICD-10-CM

## 2024-04-28 DIAGNOSIS — I10 Essential (primary) hypertension: Secondary | ICD-10-CM

## 2024-04-28 DIAGNOSIS — R0602 Shortness of breath: Secondary | ICD-10-CM

## 2024-04-28 DIAGNOSIS — I5189 Other ill-defined heart diseases: Secondary | ICD-10-CM

## 2024-04-28 DIAGNOSIS — I5031 Acute diastolic (congestive) heart failure: Secondary | ICD-10-CM | POA: Diagnosis not present

## 2024-04-28 MED ORDER — METOPROLOL SUCCINATE ER 100 MG PO TB24: 100.0000 mg | ORAL_TABLET | Freq: Every day | ORAL | 1 refills | Status: DC

## 2024-04-29 ENCOUNTER — Telehealth: Payer: Self-pay

## 2024-04-29 NOTE — Telephone Encounter (Signed)
 Patient called asking about getting a heart medication sent to her pharmacy ,  not sure which one she's referring to, need to call and find out and send refill request

## 2024-05-02 ENCOUNTER — Other Ambulatory Visit: Payer: Self-pay | Admitting: Internal Medicine

## 2024-05-02 ENCOUNTER — Encounter: Payer: Self-pay | Admitting: Cardiovascular Disease

## 2024-05-02 ENCOUNTER — Telehealth: Payer: Self-pay

## 2024-05-02 ENCOUNTER — Ambulatory Visit (INDEPENDENT_AMBULATORY_CARE_PROVIDER_SITE_OTHER): Payer: MEDICAID | Admitting: Cardiovascular Disease

## 2024-05-02 VITALS — BP 122/80 | HR 84 | Ht 66.0 in | Wt 229.8 lb

## 2024-05-02 DIAGNOSIS — R0602 Shortness of breath: Secondary | ICD-10-CM | POA: Diagnosis not present

## 2024-05-02 DIAGNOSIS — R0789 Other chest pain: Secondary | ICD-10-CM

## 2024-05-02 DIAGNOSIS — I5189 Other ill-defined heart diseases: Secondary | ICD-10-CM | POA: Diagnosis not present

## 2024-05-02 DIAGNOSIS — I340 Nonrheumatic mitral (valve) insufficiency: Secondary | ICD-10-CM

## 2024-05-02 DIAGNOSIS — I5031 Acute diastolic (congestive) heart failure: Secondary | ICD-10-CM

## 2024-05-02 MED ORDER — RIVAROXABAN 20 MG PO TABS: 20.0000 mg | ORAL_TABLET | Freq: Every day | ORAL | 2 refills | Status: DC

## 2024-05-02 MED ORDER — LISINOPRIL 10 MG PO TABS: 10.0000 mg | ORAL_TABLET | Freq: Every day | ORAL | 1 refills | Status: DC

## 2024-05-02 MED ORDER — HYDROCORTISONE 1 % EX CREA: TOPICAL_CREAM | CUTANEOUS | 3 refills | Status: AC

## 2024-05-02 MED ORDER — DAPAGLIFLOZIN PROPANEDIOL 10 MG PO TABS: 10.0000 mg | ORAL_TABLET | Freq: Every day | ORAL | 3 refills | Status: AC

## 2024-05-02 MED ORDER — LIDOCAINE VISCOUS HCL 2 % MT SOLN: 5.0000 mL | Freq: Three times a day (TID) | OROMUCOSAL | 3 refills | Status: AC | PRN

## 2024-05-02 MED ORDER — SPIRONOLACTONE 25 MG PO TABS: 25.0000 mg | ORAL_TABLET | Freq: Every day | ORAL | 0 refills | Status: DC
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
## 2024-05-02 NOTE — Telephone Encounter (Signed)
 Patient requesting refills on Magic Mouth Wash and Hydrocortisone  cream sent to [facility].

## 2024-05-03 ENCOUNTER — Other Ambulatory Visit: Payer: Self-pay

## 2024-05-03 MED ORDER — MOMETASONE FUROATE 0.1 % EX CREA: 1.0000 | TOPICAL_CREAM | CUTANEOUS | 2 refills | Status: AC

## 2024-05-04 ENCOUNTER — Ambulatory Visit: Payer: MEDICAID | Admitting: Dermatology

## 2024-05-04 ENCOUNTER — Emergency Department
Admission: EM | Admit: 2024-05-04 | Discharge: 2024-05-04 | Disposition: A | Discharge status: Home / Self Care | Payer: MEDICAID | Attending: Emergency Medicine | Admitting: Emergency Medicine

## 2024-05-04 ENCOUNTER — Emergency Department: Payer: MEDICAID

## 2024-05-04 ENCOUNTER — Other Ambulatory Visit: Payer: Self-pay

## 2024-05-04 DIAGNOSIS — B379 Candidiasis, unspecified: Secondary | ICD-10-CM | POA: Diagnosis not present

## 2024-05-04 DIAGNOSIS — M25561 Pain in right knee: Secondary | ICD-10-CM | POA: Insufficient documentation

## 2024-05-04 DIAGNOSIS — E039 Hypothyroidism, unspecified: Secondary | ICD-10-CM | POA: Diagnosis not present

## 2024-05-04 DIAGNOSIS — J449 Chronic obstructive pulmonary disease, unspecified: Secondary | ICD-10-CM | POA: Insufficient documentation

## 2024-05-04 DIAGNOSIS — E119 Type 2 diabetes mellitus without complications: Secondary | ICD-10-CM | POA: Diagnosis not present

## 2024-05-04 DIAGNOSIS — R09A2 Foreign body sensation, throat: Secondary | ICD-10-CM | POA: Insufficient documentation

## 2024-05-04 DIAGNOSIS — B372 Candidiasis of skin and nail: Secondary | ICD-10-CM

## 2024-05-04 MED ORDER — NYSTATIN 100000 UNIT/GM EX POWD: 1.0000 | Freq: Three times a day (TID) | CUTANEOUS | 0 refills | Status: AC

## 2024-05-04 NOTE — ED Notes (Signed)
 Pt ambulated to the room with steady and even gait.

## 2024-05-04 NOTE — ED Provider Notes (Signed)
 [facility] Provider Note    Event Date/Time   First MD Initiated Contact with Patient 05/04/24 [phone]     (approximate)   History   Swallowed Foreign Body   HPI  ERIANNA Li is a 43 y.o. female   Past medical history of diabetes, chronic pain, COPD, fibromyalgia, hypothyroid, PTSD, who presents to the emergency department with multiple complaints.  She was swallowing a pill antibiotic that she was prescribed by her dentist, and feels that the pill is stuck in her lower throat, pointing to her sternal notch area.  She has been able to drink water  without issue, has no difficulty breathing.  Phonation is normal.  This happened earlier tonight.  Secondly she would like me to check her right knee because she had a fall onto both of her knees 2 days ago and she has pain in her right kneecap.  She has been ambulatory since the injury.  No other injuries noted by patient.  Lastly she would like me to check skin irritation/redness in the folds of her inguinal area/groin area.  She has had no vaginal discharge or dysuria.   External Medical Documents Reviewed: Neurology notes from 2024      Physical Exam   Triage Vital Signs: ED Triage Vitals  Encounter Vitals Group     BP 05/04/24 0047 118/63     Systolic BP Percentile --      Diastolic BP Percentile --      Pulse Rate 05/04/24 0047 84     Resp 05/04/24 0047 16     Temp 05/04/24 0047 98.4 F (36.9 C)     Temp Source 05/04/24 0047 Oral     SpO2 05/04/24 0045 96 %     Weight 05/04/24 0048 240 lb (108.9 kg)     Height 05/04/24 0048 5\' 6"  (1.676 m)     Head Circumference --      Peak Flow --      Pain Score 05/04/24 0048 1     Pain Loc --      Pain Education --      Exclude from Growth Chart --     Most recent vital signs: Vitals:   05/04/24 0047 05/04/24 0230  BP: 118/63 110/69  Pulse: 84 75  Resp: 16   Temp: 98.4 F (36.9 C)   SpO2: 96% 96%    General: Awake, no distress.   CV:  Good peripheral perfusion.  Resp:  Normal effort.  Abd:  No distention.  Other:  Well-appearing with normal vital signs, afebrile.  Maintaining airway.  Phonation is normal.  Neck supple full range of motion.  Posterior pharynx appears normal without lesions exudates masses.  Lungs are clear to auscultation.  In the folds of her inguinal/groin area there appears to be red irritated inflamed skin.  There are no open wounds.  Her affected right knee has some mild tenderness to palpation about the patella but no deformities pain out of proportion crepitus and she is able to range fully.  The joint does not appear infected as there is no warmth or swelling.   ED Results / Procedures / Treatments   Labs (all labs ordered are listed, but only abnormal results are displayed) Labs Reviewed - No data to display    RADIOLOGY I independently reviewed and interpreted x-ray of the knee and I see no obvious fracture or dislocation I also reviewed radiologist's formal read.   PROCEDURES:  Critical Care performed: No  Procedures   MEDICATIONS ORDERED IN ED: Medications - No data to display   IMPRESSION / MDM / ASSESSMENT AND PLAN / ED COURSE  I reviewed the triage vital signs and the nursing notes.                                Patient's presentation is most consistent with acute presentation with potential threat to life or bodily function.  Differential diagnosis includes, but is not limited to, foreign body in the throat/airway, knee fracture dislocation, ligamentous injury, yeast infection, considered but less likely cellulitis or Fournier's gangrene    MDM:    In terms of her foreign body sensation in her throat, she is maintaining her airway and secretions and there is no emergent need for visualization of the procedure at this time.  CT of the neck did not show any radiopaque foreign bodies.  She can be discharged at this time I did give her the phone number for ENT to call  should her sensations continue in the morning she can give them a call for scope as needed.  In terms of her knee pain it happened 2 days ago and exam is pretty benign, and x-ray did not show any fractures or dislocation.  She has been ambulatory.  There is no evidence of joint infection, compartment syndrome, and thus I think she can follow-up with her PMD as an outpatient for further evaluation and treatment as needed.  In terms of her inguinal/groin fold skin irritation looks like a yeast infection.  There is no evidence of abscess or Fournier's gangrene.  I gave her an antifungal prescription.  Given her overall stability and evaluation as above, I think she can be further managed as an outpatient as I doubt life-threatening acute illnesses at this time.       FINAL CLINICAL IMPRESSION(S) / ED DIAGNOSES   Final diagnoses:  Globus sensation  Acute pain of right knee  Yeast infection of the skin     Rx / DC Orders   ED Discharge Orders          Ordered    nystatin (MYCOSTATIN/NYSTOP) powder  3 times daily        05/04/24 0224             Note:  This document was prepared using Dragon voice recognition software and may include unintentional dictation errors.    Buell Carmin, MD 05/04/24 [phone]

## 2024-05-04 NOTE — Discharge Instructions (Signed)
 The CT scan did not show any pills that were stuck in your throat.  I gave you the phone number for Dr. Silvestre Drum who is an otolaryngologist who has equipment like a small video camera that can go down your throat to further evaluate for any injuries in your throat or to double check to make sure there is nothing stuck should you continue to have these sensations in the morning.   In terms of your knee pain, an x-ray did not show any broken bones fortunately.Take acetaminophen  650 mg and ibuprofen  400 mg every 6 hours for pain.  Take with food.  Apply ice to help with pain and swelling.  In terms of the skin irritation near your groin, I have prescribed you a yeast infection powder to apply 3 times daily.  Use as directed.  Thank you for choosing us  for your health care today!  Please see your primary doctor this week for a follow up appointment.   If you have any new, worsening, or unexpected symptoms call your doctor right away or come back to the emergency department for reevaluation.  It was my pleasure to care for you today.   Arron Large Margery Sheets, MD

## 2024-05-04 NOTE — ED Triage Notes (Signed)
 Pt arrives via EMS from home for concerns of pill stuck in her throat. Pt states she is taking antibiotics from the dentist and she took the pill around 2100 tonight and has been unable to get it down. 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 Pt also states she has other medical complaints - pain in her knees from a fall and rash on her vagina.

## 2024-05-05 ENCOUNTER — Ambulatory Visit (INDEPENDENT_AMBULATORY_CARE_PROVIDER_SITE_OTHER): Payer: MEDICAID | Admitting: Cardiology

## 2024-05-05 ENCOUNTER — Encounter: Payer: Self-pay | Admitting: Cardiology

## 2024-05-05 VITALS — BP 132/74 | HR 78 | Ht 66.0 in | Wt 223.0 lb

## 2024-05-05 DIAGNOSIS — D509 Iron deficiency anemia, unspecified: Secondary | ICD-10-CM

## 2024-05-05 DIAGNOSIS — I872 Venous insufficiency (chronic) (peripheral): Secondary | ICD-10-CM

## 2024-05-05 DIAGNOSIS — Z131 Encounter for screening for diabetes mellitus: Secondary | ICD-10-CM

## 2024-05-05 DIAGNOSIS — E782 Mixed hyperlipidemia: Secondary | ICD-10-CM | POA: Diagnosis not present

## 2024-05-05 DIAGNOSIS — N898 Other specified noninflammatory disorders of vagina: Secondary | ICD-10-CM | POA: Diagnosis not present

## 2024-05-05 DIAGNOSIS — I10 Essential (primary) hypertension: Secondary | ICD-10-CM

## 2024-05-05 DIAGNOSIS — E039 Hypothyroidism, unspecified: Secondary | ICD-10-CM
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
## 2024-05-06 ENCOUNTER — Ambulatory Visit: Payer: Self-pay | Admitting: Cardiology

## 2024-05-06 LAB — LIPID PANEL
Chol/HDL Ratio: 2.7 ratio (ref 0.0–4.4)
Cholesterol, Total: 139 mg/dL (ref 100–199)
HDL: 52 mg/dL (ref >39)
LDL Chol Calc (NIH): 73 mg/dL (ref 0–99)
Triglycerides: 68 mg/dL (ref 0–149)
VLDL Cholesterol Cal: 14 mg/dL (ref 5–40)

## 2024-05-06 LAB — CMP14+EGFR
ALT: 104 IU/L — ABNORMAL HIGH (ref 0–32)
AST: 51 IU/L — ABNORMAL HIGH (ref 0–40)
Albumin: 4.4 g/dL (ref 3.9–4.9)
Alkaline Phosphatase: 108 IU/L (ref 44–121)
BUN/Creatinine Ratio: 13 (ref 9–23)
BUN: 13 mg/dL (ref 6–24)
Bilirubin Total: 0.3 mg/dL (ref 0.0–1.2)
CO2: 21 mmol/L (ref 20–29)
Calcium: 8.8 mg/dL (ref 8.7–10.2)
Chloride: 105 mmol/L (ref 96–106)
Creatinine, Ser: 1.04 mg/dL — ABNORMAL HIGH (ref 0.57–1.00)
Globulin, Total: 2.1 g/dL (ref 1.5–4.5)
Glucose: 78 mg/dL (ref 70–99)
Potassium: 3.9 mmol/L (ref 3.5–5.2)
Sodium: 142 mmol/L (ref 134–144)
Total Protein: 6.5 g/dL (ref 6.0–8.5)
eGFR: 68 mL/min/{1.73_m2} (ref >59)

## 2024-05-06 LAB — HEMOGLOBIN A1C
Est. average glucose Bld gHb Est-mCnc: 123 mg/dL
Hgb A1c MFr Bld: 5.9 % — ABNORMAL HIGH (ref 4.8–5.6)

## 2024-05-06 LAB — TSH: TSH: 0.476 u[IU]/mL (ref 0.450–4.500)

## 2024-05-07 LAB — NUSWAB BV AND CANDIDA, NAA
Candida albicans, NAA: NEGATIVE
Candida glabrata, NAA: NEGATIVE

## 2024-05-10 ENCOUNTER — Other Ambulatory Visit: Payer: Self-pay | Admitting: Cardiovascular Disease

## 2024-05-10 ENCOUNTER — Ambulatory Visit: Payer: MEDICAID | Admitting: Cardiology

## 2024-05-12 ENCOUNTER — Other Ambulatory Visit: Payer: Self-pay

## 2024-05-12 MED ORDER — ROSUVASTATIN CALCIUM 40 MG PO TABS: 40.0000 mg | ORAL_TABLET | Freq: Every day | ORAL | 1 refills | Status: DC

## 2024-05-12 MED ORDER — PANTOPRAZOLE SODIUM 40 MG PO TBEC: 40.0000 mg | DELAYED_RELEASE_TABLET | Freq: Two times a day (BID) | ORAL | 1 refills | Status: DC

## 2024-05-25 ENCOUNTER — Inpatient Hospital Stay: Payer: MEDICAID | Attending: Oncology

## 2024-05-25 ENCOUNTER — Other Ambulatory Visit: Payer: Self-pay | Admitting: *Deleted

## 2024-05-25 DIAGNOSIS — Z8249 Family history of ischemic heart disease and other diseases of the circulatory system: Secondary | ICD-10-CM | POA: Diagnosis not present

## 2024-05-25 DIAGNOSIS — Z818 Family history of other mental and behavioral disorders: Secondary | ICD-10-CM | POA: Insufficient documentation

## 2024-05-25 DIAGNOSIS — I10 Essential (primary) hypertension: Secondary | ICD-10-CM | POA: Insufficient documentation

## 2024-05-25 DIAGNOSIS — Z809 Family history of malignant neoplasm, unspecified: Secondary | ICD-10-CM | POA: Insufficient documentation

## 2024-05-25 DIAGNOSIS — Z888 Allergy status to other drugs, medicaments and biological substances status: Secondary | ICD-10-CM | POA: Insufficient documentation

## 2024-05-25 DIAGNOSIS — Z823 Family history of stroke: Secondary | ICD-10-CM | POA: Diagnosis not present

## 2024-05-25 DIAGNOSIS — Z8614 Personal history of Methicillin resistant Staphylococcus aureus infection: Secondary | ICD-10-CM | POA: Diagnosis not present

## 2024-05-25 DIAGNOSIS — Z9089 Acquired absence of other organs: Secondary | ICD-10-CM | POA: Diagnosis not present

## 2024-05-25 DIAGNOSIS — Z7901 Long term (current) use of anticoagulants: Secondary | ICD-10-CM | POA: Insufficient documentation

## 2024-05-25 DIAGNOSIS — F4310 Post-traumatic stress disorder, unspecified: Secondary | ICD-10-CM | POA: Insufficient documentation

## 2024-05-25 DIAGNOSIS — D509 Iron deficiency anemia, unspecified: Secondary | ICD-10-CM

## 2024-05-25 DIAGNOSIS — Z79899 Other long term (current) drug therapy: Secondary | ICD-10-CM | POA: Diagnosis not present

## 2024-05-25 DIAGNOSIS — M419 Scoliosis, unspecified: Secondary | ICD-10-CM | POA: Insufficient documentation

## 2024-05-25 DIAGNOSIS — Z8759 Personal history of other complications of pregnancy, childbirth and the puerperium: Secondary | ICD-10-CM | POA: Diagnosis not present

## 2024-05-25 DIAGNOSIS — J341 Cyst and mucocele of nose and nasal sinus: Secondary | ICD-10-CM | POA: Insufficient documentation

## 2024-05-25 DIAGNOSIS — Z85850 Personal history of malignant neoplasm of thyroid: Secondary | ICD-10-CM | POA: Diagnosis not present

## 2024-05-25 DIAGNOSIS — G894 Chronic pain syndrome: Secondary | ICD-10-CM | POA: Insufficient documentation

## 2024-05-25 DIAGNOSIS — Z9049 Acquired absence of other specified parts of digestive tract: Secondary | ICD-10-CM | POA: Insufficient documentation

## 2024-05-25 DIAGNOSIS — Z8261 Family history of arthritis: Secondary | ICD-10-CM | POA: Diagnosis not present

## 2024-05-25 DIAGNOSIS — Z833 Family history of diabetes mellitus: Secondary | ICD-10-CM | POA: Insufficient documentation

## 2024-05-25 LAB — CBC WITH DIFFERENTIAL/PLATELET
Abs Immature Granulocytes: 0.12 10*3/uL — ABNORMAL HIGH (ref 0.00–0.07)
Basophils Absolute: 0 10*3/uL (ref 0.0–0.1)
Basophils Relative: 1 %
Eosinophils Absolute: 0.1 10*3/uL (ref 0.0–0.5)
Eosinophils Relative: 1 %
HCT: 39 % (ref 36.0–46.0)
Hemoglobin: 12.1 g/dL (ref 12.0–15.0)
Immature Granulocytes: 1 %
Lymphocytes Relative: 23 %
Lymphs Abs: 2 10*3/uL (ref 0.7–4.0)
MCH: 26.8 pg (ref 26.0–34.0)
MCHC: 31 g/dL (ref 30.0–36.0)
MCV: 86.3 fL (ref 80.0–100.0)
Monocytes Absolute: 0.8 10*3/uL (ref 0.1–1.0)
Monocytes Relative: 10 %
Neutro Abs: 5.5 10*3/uL (ref 1.7–7.7)
Neutrophils Relative %: 64 %
Platelets: 234 10*3/uL (ref 150–400)
RBC: 4.52 MIL/uL (ref 3.87–5.11)
RDW: 17.3 % — ABNORMAL HIGH (ref 11.5–15.5)
WBC: 8.6 10*3/uL (ref 4.0–10.5)
nRBC: 0 % (ref 0.0–0.2)

## 2024-05-25 LAB — IRON AND TIBC
Iron: 61 ug/dL (ref 28–170)
Saturation Ratios: 17 % (ref 10.4–31.8)
TIBC: 357 ug/dL (ref 250–450)
UIBC: 296 ug/dL

## 2024-05-25 LAB — FERRITIN: Ferritin: 52 ng/mL (ref 11–307)

## 2024-05-26 ENCOUNTER — Encounter: Payer: Self-pay | Admitting: Oncology

## 2024-05-26 ENCOUNTER — Inpatient Hospital Stay (HOSPITAL_BASED_OUTPATIENT_CLINIC_OR_DEPARTMENT_OTHER): Payer: MEDICAID | Admitting: Oncology

## 2024-05-26 ENCOUNTER — Inpatient Hospital Stay: Payer: MEDICAID

## 2024-05-26 VITALS — BP 108/68 | HR 78 | Temp 97.1°F | Resp 16 | Wt 230.0 lb

## 2024-05-26 DIAGNOSIS — D509 Iron deficiency anemia, unspecified: Secondary | ICD-10-CM | POA: Diagnosis not present
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
## 2024-05-27 ENCOUNTER — Encounter: Payer: Self-pay | Admitting: Oncology

## 2024-06-06 ENCOUNTER — Ambulatory Visit: Payer: MEDICAID | Admitting: Cardiology

## 2024-06-09 ENCOUNTER — Telehealth: Payer: Self-pay

## 2024-06-09 ENCOUNTER — Other Ambulatory Visit: Payer: Self-pay | Admitting: Internal Medicine

## 2024-06-09 ENCOUNTER — Other Ambulatory Visit: Payer: Self-pay | Admitting: Cardiology

## 2024-06-09 DIAGNOSIS — E039 Hypothyroidism, unspecified: Secondary | ICD-10-CM

## 2024-06-09 DIAGNOSIS — E782 Mixed hyperlipidemia: Secondary | ICD-10-CM

## 2024-06-09 DIAGNOSIS — R7303 Prediabetes: Secondary | ICD-10-CM

## 2024-06-09 NOTE — Telephone Encounter (Signed)
 Pt called requesting a referral to [facility] Endocronologist.

## 2024-06-09 NOTE — Telephone Encounter (Signed)
 Patient informed.

## 2024-06-14 ENCOUNTER — Encounter (INDEPENDENT_AMBULATORY_CARE_PROVIDER_SITE_OTHER): Payer: MEDICAID | Admitting: Nurse Practitioner

## 2024-06-24 ENCOUNTER — Encounter (INDEPENDENT_AMBULATORY_CARE_PROVIDER_SITE_OTHER): Payer: MEDICAID | Admitting: Nurse Practitioner

## 2024-07-05 ENCOUNTER — Other Ambulatory Visit: Payer: Self-pay | Admitting: Internal Medicine

## 2024-07-28 ENCOUNTER — Other Ambulatory Visit: Payer: Self-pay | Admitting: Cardiology

## 2024-07-28 ENCOUNTER — Ambulatory Visit (INDEPENDENT_AMBULATORY_CARE_PROVIDER_SITE_OTHER): Payer: MEDICAID | Admitting: Cardiovascular Disease

## 2024-07-28 ENCOUNTER — Encounter: Payer: Self-pay | Admitting: Cardiovascular Disease

## 2024-07-28 VITALS — BP 112/66 | HR 85 | Ht 66.0 in | Wt 244.0 lb

## 2024-07-28 DIAGNOSIS — I10 Essential (primary) hypertension: Secondary | ICD-10-CM

## 2024-07-28 DIAGNOSIS — I340 Nonrheumatic mitral (valve) insufficiency: Secondary | ICD-10-CM

## 2024-07-28 DIAGNOSIS — R0789 Other chest pain: Secondary | ICD-10-CM

## 2024-07-28 DIAGNOSIS — R0602 Shortness of breath: Secondary | ICD-10-CM | POA: Diagnosis not present

## 2024-07-28 DIAGNOSIS — Z202 Contact with and (suspected) exposure to infections with a predominantly sexual mode of transmission: Secondary | ICD-10-CM

## 2024-07-28 DIAGNOSIS — Z114 Encounter for screening for human immunodeficiency virus [HIV]: Secondary | ICD-10-CM
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
## 2024-08-02 ENCOUNTER — Ambulatory Visit: Payer: MEDICAID | Admitting: Cardiovascular Disease

## 2024-09-01 ENCOUNTER — Other Ambulatory Visit: Payer: Self-pay | Admitting: Internal Medicine

## 2024-09-08 ENCOUNTER — Telehealth: Payer: Self-pay

## 2024-09-08 ENCOUNTER — Other Ambulatory Visit: Payer: Self-pay | Admitting: Cardiology

## 2024-09-08 NOTE — Telephone Encounter (Signed)
 Pt called requesting a referral for endocrinology be sent in for her.

## 2024-09-08 NOTE — Progress Notes (Signed)
 Opened in error

## 2024-09-09 ENCOUNTER — Other Ambulatory Visit: Payer: Self-pay | Admitting: Cardiology

## 2024-09-09 DIAGNOSIS — E039 Hypothyroidism, unspecified: Secondary | ICD-10-CM

## 2024-09-12 ENCOUNTER — Telehealth: Payer: Self-pay

## 2024-09-12 NOTE — Telephone Encounter (Signed)
 Patient Kristina Li asking for call back about her information for the new Endo referral that Amber sent for her can someone call her with that information

## 2024-09-15 ENCOUNTER — Other Ambulatory Visit: Payer: Self-pay | Admitting: Cardiovascular Disease

## 2024-09-27 ENCOUNTER — Ambulatory Visit: Payer: MEDICAID | Admitting: Dermatology

## 2024-09-28 ENCOUNTER — Inpatient Hospital Stay: Payer: MEDICAID | Attending: Oncology

## 2024-09-28 DIAGNOSIS — Z888 Allergy status to other drugs, medicaments and biological substances status: Secondary | ICD-10-CM | POA: Insufficient documentation

## 2024-09-28 DIAGNOSIS — E039 Hypothyroidism, unspecified: Secondary | ICD-10-CM | POA: Diagnosis not present

## 2024-09-28 DIAGNOSIS — Z85850 Personal history of malignant neoplasm of thyroid: Secondary | ICD-10-CM | POA: Diagnosis not present

## 2024-09-28 DIAGNOSIS — Z91040 Latex allergy status: Secondary | ICD-10-CM | POA: Diagnosis not present

## 2024-09-28 DIAGNOSIS — F32A Depression, unspecified: Secondary | ICD-10-CM | POA: Diagnosis not present

## 2024-09-28 DIAGNOSIS — M797 Fibromyalgia: Secondary | ICD-10-CM | POA: Diagnosis not present

## 2024-09-28 DIAGNOSIS — Z79899 Other long term (current) drug therapy: Secondary | ICD-10-CM | POA: Diagnosis not present

## 2024-09-28 DIAGNOSIS — R5382 Chronic fatigue, unspecified: Secondary | ICD-10-CM | POA: Diagnosis not present

## 2024-09-28 DIAGNOSIS — J4489 Other specified chronic obstructive pulmonary disease: Secondary | ICD-10-CM | POA: Insufficient documentation

## 2024-09-28 DIAGNOSIS — Z8759 Personal history of other complications of pregnancy, childbirth and the puerperium: Secondary | ICD-10-CM | POA: Diagnosis not present

## 2024-09-28 DIAGNOSIS — F4310 Post-traumatic stress disorder, unspecified: Secondary | ICD-10-CM | POA: Insufficient documentation

## 2024-09-28 DIAGNOSIS — Z818 Family history of other mental and behavioral disorders: Secondary | ICD-10-CM | POA: Insufficient documentation

## 2024-09-28 DIAGNOSIS — M419 Scoliosis, unspecified: Secondary | ICD-10-CM | POA: Insufficient documentation

## 2024-09-28 DIAGNOSIS — Z833 Family history of diabetes mellitus: Secondary | ICD-10-CM | POA: Insufficient documentation

## 2024-09-28 DIAGNOSIS — G894 Chronic pain syndrome: Secondary | ICD-10-CM | POA: Diagnosis not present

## 2024-09-28 DIAGNOSIS — Z7901 Long term (current) use of anticoagulants: Secondary | ICD-10-CM | POA: Insufficient documentation

## 2024-09-28 DIAGNOSIS — M1990 Unspecified osteoarthritis, unspecified site: Secondary | ICD-10-CM | POA: Insufficient documentation

## 2024-09-28 DIAGNOSIS — Z811 Family history of alcohol abuse and dependence: Secondary | ICD-10-CM | POA: Insufficient documentation

## 2024-09-28 DIAGNOSIS — Z9089 Acquired absence of other organs: Secondary | ICD-10-CM | POA: Insufficient documentation

## 2024-09-28 DIAGNOSIS — Z825 Family history of asthma and other chronic lower respiratory diseases: Secondary | ICD-10-CM | POA: Insufficient documentation

## 2024-09-28 DIAGNOSIS — Z809 Family history of malignant neoplasm, unspecified: Secondary | ICD-10-CM | POA: Insufficient documentation

## 2024-09-28 DIAGNOSIS — Z8261 Family history of arthritis: Secondary | ICD-10-CM | POA: Insufficient documentation

## 2024-09-28 DIAGNOSIS — I341 Nonrheumatic mitral (valve) prolapse: Secondary | ICD-10-CM | POA: Insufficient documentation

## 2024-09-28 DIAGNOSIS — Z9049 Acquired absence of other specified parts of digestive tract: Secondary | ICD-10-CM | POA: Diagnosis not present

## 2024-09-28 DIAGNOSIS — F419 Anxiety disorder, unspecified: Secondary | ICD-10-CM | POA: Diagnosis not present

## 2024-09-28 DIAGNOSIS — I10 Essential (primary) hypertension: Secondary | ICD-10-CM | POA: Diagnosis not present

## 2024-09-28 DIAGNOSIS — Z823 Family history of stroke: Secondary | ICD-10-CM | POA: Insufficient documentation

## 2024-09-28 DIAGNOSIS — D509 Iron deficiency anemia, unspecified: Secondary | ICD-10-CM | POA: Insufficient documentation

## 2024-09-28 DIAGNOSIS — Z8614 Personal history of Methicillin resistant Staphylococcus aureus infection: Secondary | ICD-10-CM | POA: Diagnosis not present

## 2024-09-28 DIAGNOSIS — Z8249 Family history of ischemic heart disease and other diseases of the circulatory system: Secondary | ICD-10-CM | POA: Insufficient documentation

## 2024-09-28 LAB — CBC WITH DIFFERENTIAL/PLATELET
Abs Immature Granulocytes: 0.1 K/uL — ABNORMAL HIGH (ref 0.00–0.07)
Basophils Absolute: 0 K/uL (ref 0.0–0.1)
Basophils Relative: 0 %
Eosinophils Absolute: 0.1 K/uL (ref 0.0–0.5)
Eosinophils Relative: 2 %
HCT: 37.9 % (ref 36.0–46.0)
Hemoglobin: 12.1 g/dL (ref 12.0–15.0)
Immature Granulocytes: 1 %
Lymphocytes Relative: 22 %
Lymphs Abs: 1.6 K/uL (ref 0.7–4.0)
MCH: 26.4 pg (ref 26.0–34.0)
MCHC: 31.9 g/dL (ref 30.0–36.0)
MCV: 82.6 fL (ref 80.0–100.0)
Monocytes Absolute: 0.7 K/uL (ref 0.1–1.0)
Monocytes Relative: 9 %
Neutro Abs: 4.8 K/uL (ref 1.7–7.7)
Neutrophils Relative %: 66 %
Platelets: 255 K/uL (ref 150–400)
RBC: 4.59 MIL/uL (ref 3.87–5.11)
RDW: 15.4 % (ref 11.5–15.5)
WBC: 7.3 K/uL (ref 4.0–10.5)
nRBC: 0 % (ref 0.0–0.2)

## 2024-09-28 LAB — IRON AND TIBC
Iron: 56 ug/dL (ref 28–170)
Saturation Ratios: 14 % (ref 10.4–31.8)
TIBC: 392 ug/dL (ref 250–450)
UIBC: 336 ug/dL

## 2024-09-28 LAB — FERRITIN: Ferritin: 22 ng/mL (ref 11–307)

## 2024-09-29 ENCOUNTER — Encounter: Payer: Self-pay | Admitting: Oncology

## 2024-09-29 ENCOUNTER — Inpatient Hospital Stay: Payer: MEDICAID

## 2024-09-29 ENCOUNTER — Inpatient Hospital Stay (HOSPITAL_BASED_OUTPATIENT_CLINIC_OR_DEPARTMENT_OTHER): Payer: MEDICAID | Admitting: Oncology

## 2024-09-29 VITALS — BP 123/77 | HR 68 | Temp 97.8°F | Resp 18 | Ht 66.0 in | Wt 244.0 lb

## 2024-09-29 DIAGNOSIS — D509 Iron deficiency anemia, unspecified: Secondary | ICD-10-CM

## 2024-09-29 NOTE — Progress Notes (Signed)
 Patient is feeling very fatigued and sluggish, she feels like she would need her iron  infusion.
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
## 2024-10-13 ENCOUNTER — Other Ambulatory Visit: Payer: Self-pay | Admitting: Cardiology

## 2024-10-14 ENCOUNTER — Other Ambulatory Visit: Payer: Self-pay | Admitting: Cardiology

## 2024-10-14 ENCOUNTER — Other Ambulatory Visit: Payer: Self-pay | Admitting: Cardiovascular Disease

## 2024-10-14 ENCOUNTER — Encounter (INDEPENDENT_AMBULATORY_CARE_PROVIDER_SITE_OTHER): Payer: MEDICAID | Admitting: Nurse Practitioner

## 2024-11-09 ENCOUNTER — Other Ambulatory Visit: Payer: Self-pay | Admitting: Cardiology

## 2024-11-14 ENCOUNTER — Other Ambulatory Visit: Payer: Self-pay | Admitting: Cardiology

## 2024-11-15 ENCOUNTER — Ambulatory Visit: Payer: MEDICAID | Admitting: Endocrinology

## 2024-12-07 ENCOUNTER — Other Ambulatory Visit: Payer: Self-pay | Admitting: Cardiovascular Disease

## 2025-04-03 ENCOUNTER — Inpatient Hospital Stay: Payer: MEDICAID

## 2025-04-04 ENCOUNTER — Inpatient Hospital Stay: Payer: MEDICAID

## 2025-04-04 ENCOUNTER — Inpatient Hospital Stay: Payer: MEDICAID | Admitting: Oncology
